# Patient Record
Sex: Female | Born: 1960 | Race: White | Hispanic: No | Marital: Married | Smoking: Never smoker
Health system: Southern US, Community
[De-identification: ages and names within clinical notes are randomized; demographics above are authoritative.]

## PROBLEM LIST (undated history)

## (undated) DIAGNOSIS — Q2381 Bicuspid aortic valve: Secondary | ICD-10-CM

## (undated) DIAGNOSIS — G43909 Migraine, unspecified, not intractable, without status migrainosus: Secondary | ICD-10-CM

## (undated) DIAGNOSIS — R002 Palpitations: Secondary | ICD-10-CM

## (undated) DIAGNOSIS — E785 Hyperlipidemia, unspecified: Secondary | ICD-10-CM

## (undated) DIAGNOSIS — K219 Gastro-esophageal reflux disease without esophagitis: Secondary | ICD-10-CM

## (undated) DIAGNOSIS — F419 Anxiety disorder, unspecified: Secondary | ICD-10-CM

## (undated) DIAGNOSIS — I341 Nonrheumatic mitral (valve) prolapse: Secondary | ICD-10-CM

## (undated) DIAGNOSIS — L918 Other hypertrophic disorders of the skin: Secondary | ICD-10-CM

## (undated) DIAGNOSIS — I350 Nonrheumatic aortic (valve) stenosis: Secondary | ICD-10-CM

## (undated) DIAGNOSIS — E119 Type 2 diabetes mellitus without complications: Secondary | ICD-10-CM

## (undated) DIAGNOSIS — Z9889 Other specified postprocedural states: Secondary | ICD-10-CM

## (undated) DIAGNOSIS — Q231 Congenital insufficiency of aortic valve: Secondary | ICD-10-CM

## (undated) DIAGNOSIS — I10 Essential (primary) hypertension: Secondary | ICD-10-CM

## (undated) DIAGNOSIS — R011 Cardiac murmur, unspecified: Secondary | ICD-10-CM

## (undated) DIAGNOSIS — M25579 Pain in unspecified ankle and joints of unspecified foot: Secondary | ICD-10-CM

## (undated) DIAGNOSIS — G2581 Restless legs syndrome: Secondary | ICD-10-CM

## (undated) DIAGNOSIS — M1711 Unilateral primary osteoarthritis, right knee: Principal | ICD-10-CM

## (undated) DIAGNOSIS — E669 Obesity, unspecified: Secondary | ICD-10-CM

## (undated) HISTORY — DX: Palpitations: R00.2

## (undated) HISTORY — DX: Unilateral primary osteoarthritis, right knee: M17.11

## (undated) HISTORY — DX: Bicuspid aortic valve: Q23.81

## (undated) HISTORY — DX: Cardiac murmur, unspecified: R01.1

## (undated) HISTORY — DX: Other specified postprocedural states: Z98.890

## (undated) HISTORY — DX: Nonrheumatic aortic (valve) stenosis: I35.0

## (undated) HISTORY — DX: Restless legs syndrome: G25.81

## (undated) HISTORY — DX: Migraine, unspecified, not intractable, without status migrainosus: G43.909

## (undated) HISTORY — DX: Obesity, unspecified: E66.9

## (undated) HISTORY — DX: Essential (primary) hypertension: I10

## (undated) HISTORY — PX: HERNIA REPAIR: SHX51

## (undated) HISTORY — DX: Pain in unspecified ankle and joints of unspecified foot: M25.579

## (undated) HISTORY — DX: Type 2 diabetes mellitus without complications: E11.9

## (undated) HISTORY — DX: Hyperlipidemia, unspecified: E78.5

## (undated) HISTORY — DX: Other hypertrophic disorders of the skin: L91.8

## (undated) HISTORY — DX: Congenital insufficiency of aortic valve: Q23.1

## (undated) HISTORY — PX: KNEE SURGERY: SHX244

## (undated) HISTORY — DX: Anxiety disorder, unspecified: F41.9

---

## 1999-10-31 HISTORY — PX: CHOLECYSTECTOMY: SHX55

## 2001-10-30 HISTORY — PX: ACHILLES TENDON REPAIR: SUR1153

## 2001-10-30 HISTORY — PX: UMBILICAL HERNIA REPAIR: SHX196

## 2002-04-14 ENCOUNTER — Ambulatory Visit (HOSPITAL_COMMUNITY): Admission: RE | Admit: 2002-04-14 | Discharge: 2002-04-14 | Payer: Self-pay | Admitting: Orthopaedic Surgery

## 2005-02-13 ENCOUNTER — Ambulatory Visit: Payer: Self-pay | Admitting: Cardiology

## 2005-07-11 ENCOUNTER — Ambulatory Visit: Payer: Self-pay | Admitting: Cardiology

## 2006-10-30 HISTORY — PX: CARDIAC CATHETERIZATION: SHX172

## 2006-12-05 ENCOUNTER — Ambulatory Visit: Payer: Self-pay | Admitting: Orthopedic Surgery

## 2006-12-17 ENCOUNTER — Ambulatory Visit: Payer: Self-pay | Admitting: Cardiology

## 2006-12-21 ENCOUNTER — Ambulatory Visit: Payer: Self-pay | Admitting: Cardiology

## 2007-01-10 ENCOUNTER — Ambulatory Visit: Payer: Self-pay | Admitting: Cardiology

## 2007-01-22 ENCOUNTER — Ambulatory Visit: Payer: Self-pay | Admitting: Cardiology

## 2007-01-24 ENCOUNTER — Ambulatory Visit: Payer: Self-pay | Admitting: Cardiovascular Disease

## 2007-01-24 ENCOUNTER — Inpatient Hospital Stay (HOSPITAL_BASED_OUTPATIENT_CLINIC_OR_DEPARTMENT_OTHER): Admission: RE | Admit: 2007-01-24 | Discharge: 2007-01-24 | Payer: Self-pay | Admitting: Cardiovascular Disease

## 2007-02-08 ENCOUNTER — Ambulatory Visit: Payer: Self-pay | Admitting: Physician Assistant

## 2007-12-02 LAB — CONVERTED CEMR LAB

## 2008-02-06 ENCOUNTER — Encounter (INDEPENDENT_AMBULATORY_CARE_PROVIDER_SITE_OTHER): Payer: Self-pay | Admitting: Family Medicine

## 2008-03-27 ENCOUNTER — Encounter (INDEPENDENT_AMBULATORY_CARE_PROVIDER_SITE_OTHER): Payer: Self-pay | Admitting: Family Medicine

## 2008-06-01 ENCOUNTER — Ambulatory Visit: Payer: Self-pay | Admitting: Family Medicine

## 2008-06-01 DIAGNOSIS — E669 Obesity, unspecified: Secondary | ICD-10-CM

## 2008-06-01 DIAGNOSIS — Q231 Congenital insufficiency of aortic valve: Secondary | ICD-10-CM

## 2008-06-01 DIAGNOSIS — F411 Generalized anxiety disorder: Secondary | ICD-10-CM | POA: Insufficient documentation

## 2008-06-01 DIAGNOSIS — G43909 Migraine, unspecified, not intractable, without status migrainosus: Secondary | ICD-10-CM | POA: Insufficient documentation

## 2008-06-01 DIAGNOSIS — I059 Rheumatic mitral valve disease, unspecified: Secondary | ICD-10-CM | POA: Insufficient documentation

## 2008-06-01 DIAGNOSIS — E119 Type 2 diabetes mellitus without complications: Secondary | ICD-10-CM

## 2008-06-01 DIAGNOSIS — Q2381 Bicuspid aortic valve: Secondary | ICD-10-CM | POA: Insufficient documentation

## 2008-06-01 DIAGNOSIS — I10 Essential (primary) hypertension: Secondary | ICD-10-CM

## 2008-06-01 LAB — CONVERTED CEMR LAB
Bilirubin Urine: NEGATIVE
Blood in Urine, dipstick: NEGATIVE
Glucose, Bld: 98 mg/dL
Glucose, Urine, Semiquant: NEGATIVE
Hgb A1c MFr Bld: 5.6 %
Ketones, urine, test strip: NEGATIVE
Nitrite: NEGATIVE
Protein, U semiquant: NEGATIVE
Specific Gravity, Urine: 1.015
Urobilinogen, UA: 0.2
pH: 6

## 2008-06-19 ENCOUNTER — Encounter (INDEPENDENT_AMBULATORY_CARE_PROVIDER_SITE_OTHER): Payer: Self-pay | Admitting: Family Medicine

## 2008-06-29 ENCOUNTER — Ambulatory Visit: Payer: Self-pay | Admitting: Family Medicine

## 2008-06-29 DIAGNOSIS — E785 Hyperlipidemia, unspecified: Secondary | ICD-10-CM | POA: Insufficient documentation

## 2008-06-29 DIAGNOSIS — L909 Atrophic disorder of skin, unspecified: Secondary | ICD-10-CM | POA: Insufficient documentation

## 2008-06-29 DIAGNOSIS — L919 Hypertrophic disorder of the skin, unspecified: Secondary | ICD-10-CM

## 2008-06-29 LAB — CONVERTED CEMR LAB
Cholesterol, target level: 200 mg/dL
Cholesterol: 165 mg/dL
HDL goal, serum: 40 mg/dL
HDL: 42 mg/dL
LDL Cholesterol: 82 mg/dL
LDL Goal: 100 mg/dL
Triglycerides: 206 mg/dL

## 2008-08-20 ENCOUNTER — Ambulatory Visit: Payer: Self-pay | Admitting: Family Medicine

## 2008-08-20 DIAGNOSIS — G2581 Restless legs syndrome: Secondary | ICD-10-CM

## 2008-08-20 LAB — CONVERTED CEMR LAB
Glucose, Bld: 158 mg/dL
Hgb A1c MFr Bld: 6.4 %

## 2008-09-28 ENCOUNTER — Encounter (INDEPENDENT_AMBULATORY_CARE_PROVIDER_SITE_OTHER): Payer: Self-pay | Admitting: Family Medicine

## 2008-11-06 ENCOUNTER — Ambulatory Visit: Payer: Self-pay | Admitting: Family Medicine

## 2008-11-06 LAB — CONVERTED CEMR LAB: Blood Glucose, Fasting: 152 mg/dL

## 2008-12-08 ENCOUNTER — Ambulatory Visit: Payer: Self-pay | Admitting: Cardiology

## 2008-12-08 ENCOUNTER — Ambulatory Visit: Payer: Self-pay | Admitting: Family Medicine

## 2008-12-08 DIAGNOSIS — G894 Chronic pain syndrome: Secondary | ICD-10-CM | POA: Insufficient documentation

## 2008-12-08 DIAGNOSIS — J069 Acute upper respiratory infection, unspecified: Secondary | ICD-10-CM | POA: Insufficient documentation

## 2008-12-08 DIAGNOSIS — R079 Chest pain, unspecified: Secondary | ICD-10-CM

## 2008-12-09 ENCOUNTER — Encounter (INDEPENDENT_AMBULATORY_CARE_PROVIDER_SITE_OTHER): Payer: Self-pay | Admitting: Family Medicine

## 2008-12-09 LAB — CONVERTED CEMR LAB
Amphetamine Screen, Ur: NEGATIVE
Barbiturate Quant, Ur: NEGATIVE
Benzodiazepines.: POSITIVE — AB
Cocaine Metabolites: NEGATIVE
Creatinine,U: 85.3 mg/dL
Marijuana Metabolite: NEGATIVE
Methadone: NEGATIVE
Opiate Screen, Urine: NEGATIVE
Phencyclidine (PCP): NEGATIVE
Propoxyphene: NEGATIVE

## 2008-12-11 ENCOUNTER — Ambulatory Visit (HOSPITAL_COMMUNITY): Admission: RE | Admit: 2008-12-11 | Discharge: 2008-12-11 | Payer: Self-pay | Admitting: Family Medicine

## 2008-12-11 ENCOUNTER — Encounter (INDEPENDENT_AMBULATORY_CARE_PROVIDER_SITE_OTHER): Payer: Self-pay | Admitting: Family Medicine

## 2008-12-11 ENCOUNTER — Ambulatory Visit: Payer: Self-pay | Admitting: Cardiology

## 2008-12-11 ENCOUNTER — Telehealth (INDEPENDENT_AMBULATORY_CARE_PROVIDER_SITE_OTHER): Payer: Self-pay | Admitting: Family Medicine

## 2008-12-21 ENCOUNTER — Encounter (INDEPENDENT_AMBULATORY_CARE_PROVIDER_SITE_OTHER): Payer: Self-pay | Admitting: Family Medicine

## 2008-12-22 ENCOUNTER — Telehealth (INDEPENDENT_AMBULATORY_CARE_PROVIDER_SITE_OTHER): Payer: Self-pay | Admitting: Family Medicine

## 2010-02-01 ENCOUNTER — Ambulatory Visit (HOSPITAL_COMMUNITY): Admission: RE | Admit: 2010-02-01 | Discharge: 2010-02-01 | Payer: Self-pay | Admitting: Family Medicine

## 2010-02-15 ENCOUNTER — Ambulatory Visit: Payer: Self-pay | Admitting: Cardiology

## 2010-05-20 ENCOUNTER — Ambulatory Visit (HOSPITAL_COMMUNITY): Admission: RE | Admit: 2010-05-20 | Discharge: 2010-05-20 | Payer: Self-pay | Admitting: General Surgery

## 2010-11-19 ENCOUNTER — Encounter: Payer: Self-pay | Admitting: Neurology

## 2011-01-14 LAB — SURGICAL PCR SCREEN
MRSA, PCR: NEGATIVE
Staphylococcus aureus: NEGATIVE

## 2011-01-14 LAB — BASIC METABOLIC PANEL
BUN: 11 mg/dL (ref 6–23)
CO2: 24 mEq/L (ref 19–32)
Calcium: 9.4 mg/dL (ref 8.4–10.5)
Creatinine, Ser: 0.57 mg/dL (ref 0.4–1.2)
GFR calc non Af Amer: >60 mL/min (ref >60)
Glucose, Bld: 148 mg/dL — ABNORMAL HIGH (ref 70–99)

## 2011-01-14 LAB — CBC
Hemoglobin: 13.3 g/dL (ref 12.0–15.0)
MCH: 29.6 pg (ref 26.0–34.0)
MCHC: 33.7 g/dL (ref 30.0–36.0)
RDW: 12.8 % (ref 11.5–15.5)

## 2011-03-17 NOTE — Cardiovascular Report (Signed)
Stacy Frost                  ACCOUNT NO.:  [id number]   MEDICAL RECORD NO.:  [id number]          PATIENT TYPE:  OIB   LOCATION:  NA                           FACILITY:  [facility]   PHYSICIAN:  Veverly Fells. Excell Seltzer, MD  DATE OF BIRTH:  1961/06/19   DATE OF PROCEDURE:  DATE OF DISCHARGE:                            CARDIAC CATHETERIZATION   Stacy Frost CARDIOLOGY CARDIAC CATH NOTE.   PROCEDURE:  1. Left heart catheterization.  2. Left coronary angiography.  3. Left ventricular angiography.  4. Aortic root angiography.   INDICATIONS FOR PROCEDURE:  Stacy Frost is a 50 year old woman with  multiple cardiac risk factors.  She has bicuspid aortic valve.  She has  had persistent chest pain and was referred for cardiac catheterization  in the setting of her multiple underlying risk factors for  atherosclerotic coronary artery disease.   RISKS AND INDICATIONS:  The risks and indications of the procedure were  explained to the patient.  Informed consent was obtained.  The right  groin was prepped and draped and anesthetized with 1% Lidocaine.  Using  modified Seldinger technique, a 4 French sheath was placed in the right  femoral artery via front wall puncture.  Multiple views of the right and  left coronary arteries were taken with standard 4 [place] and Judkins'  catheters.  Following selective coronary angiography, an angled pigtail  catheter was inserted into the left ventricle and pressures were  recorded.  A 30 degree RAO left ventriculogram was performed.  A  pullback across the aortic valve was done.  The left anterior oblique  aortogram was performed to assess the bicuspid aortic valve and proximal  aortic root.  The patient tolerated the procedure well and no immediate  complications.  At the conclusion of the case, the sheath was pulled and  manual pressure used for hemostasis.   FINDINGS:  The aortic pressure 111/61 with mean of 83.  Left ventricular  pressure 117/08 with an end  diastolic pressure of 22.   The left main stem is widely patent.  There is no angiographic disease.  It bifurcates into the LAD and left circumflex.   The LAD is a large caliber vessel that courses down to the LV apex.  It  gives off a 1st diagonal branch that is of medium caliber.  There is  also enlarged 1st perforator in that region of the LAD.  There is no  significant angiographic disease throughout the LAD or diagonal  branches.   The left circumflex is large caliber.  It provides a very large 1st  obtuse marginal branch that branches into twin vessels in its mid  portion.  The true AV groove circumflex continues to be a large caliber  vessel and gives off a small 2nd OM branch and a large left  posterolateral branch.  There is an atrial branch as well from the mid  portion of the left circumflex.  There is no significant angiographic  disease in the left circumflex system or any of the obtuse marginals.   The right coronary artery has a high anterior take-off.  There is a  conus branch from the proximal portion of the vessel.  The right  coronary artery is dominant and provides a posterior descending branch  as well as a posterolateral branch.  There is no significant  angiographic disease in the right coronary artery.   Left ventricular function as assessed by 30 degrees RAO left  ventriculogram shows normal systolic function with an estimated LVEF of  60%.   Aortic root angiogram demonstrates what is likely a bicuspid aortic  valve with a domed appearance.  There is no aortic insufficiency.  The  proximal aortic root is mildly dilated.   ASSESSMENT:  1. Normal coronary arteries.  2. Normal left ventricular systolic function.  3. Mild dilatation of the aortic root.  4. Mild dilatation of proximal ascending aorta.  5. Likely bicuspid aortic valve.   RECOMMENDATIONS:  The patient's blood pressure is under very good  control on her current regimen.  She has normal  coronary arteries. Her  aortic root dilation appears very mild but may require longitudinal  followup.  Ongoing care per Dr. Andee Lineman.      Veverly Fells. Excell Seltzer, MD  Electronically Signed     MDC/MEDQ  D:  01/24/2007  T:  01/24/2007  Job:  [id number]   cc:   Learta Codding, MD,FACC

## 2011-03-17 NOTE — Assessment & Plan Note (Signed)
[facility]                          [facility]   NAME:Stacy Frost, Stacy Frost                         MRN:          [id number]  DATE:12/17/2006                            DOB:          09/14/1961    PRIMARY CARDIOLOGIST:  Stacy Codding, MD,FACC   REASON FOR VISIT:  Ms. Stacy Frost is a 50 year old female, with a history of  bicuspid aortic valve, mitral valve prolapse, and history of  inappropriate sinus tachycardia with AVNRT, last seen by our team with  Dr. Andee Frost in April of 2006 for atypical chest pain, and previously  followed closely by Dr. Harland Frost. She now presents to our clinic  to re-establish with Dr. Andee Frost.   When last seen by our group, here at [facility] in consultation  in 2006, she had a 2D echocardiogram revealing bicuspid aortic valve  with no significant stenosis and only trace aortic insufficiency. It  also showed very mild MVP with only slight redundancy of the mitral  valve and mild mitral regurgitation.   A stress test was also done, which was interpreted as normal with  calculated ejection fraction of 59%.   Most recently, the patient states that she has developed a rapid heart  beat with rates as high as 120 with a sudden drop into the 60s. This has  made her feel a little lightheaded and dizzy, but with no frank syncope.  There has also been no shortness of breath and no associated dyspnea.   The patient has also noted elevated blood pressure readings at home as  compared to previous adequate control. Given the development of these  new symptoms in the past ten days or so, she has requested to resume  cardiology care with our team here today.   Electrocardiogram today reveals NSR at 86 bpm with left axis deviation  and borderline LVH by voltage criteria with nonspecific ST changes and  poor R-wave progression.   CURRENT MEDICATIONS:  1. Toprol XL 200 daily.  2. Metformin 1000 daily.  3. Amaryl 4  daily.  4. Hydrochlorothiazide 25 daily.   PHYSICAL EXAMINATION:  Blood pressure is 177/103, pulse 93, regular.  Weight is 236.4.  GENERAL: A 50 year old female, moderately obese, in no apparent  distress.  HEENT: Normocephalic, atraumatic.  NECK: Palpable bilateral carotid pulses without bruits.  LUNGS:  Clear to auscultation in all fields.  HEART: Regular rate and rhythm (S1, S2). Audible mid-systolic clicks  with no associated significant murmurs; no audible diastolic blow.  ABDOMEN: Soft, nontender with intact bowel sounds.  EXTREMITIES: Palpable pulses without significant edema.  NEURO: No focal deficit.   IMPRESSION:  1. Valvular heart disease.      a.     Known bicuspid aortic valve by previous 2D echos.      b.     History of mitral valve prolapse.  2. Tachy palpitations.      a.     History of SVT with inappropriate sinus tachycardia.      b.     Previously treated with beta-blockers and calcium-channel  blockers.  3. History of atypical chest pain.      a.     Normal stress Cardiolite in 2006.  4. Uncontrolled hypertension.  5. Obesity.  6. Type 2 diabetes mellitus.  7. History of hypertriglyceridemia.   PLAN:  1. Schedule 2D echocardiogram for reassessment of left ventricular      function and severity of valvular heart disease.  2. Schedule CardioNet monitor to rule out recurrent dysrhythmia.  3. Complete metabolic panel and TSH level.  4. Start lisinopril 20 mg daily for better blood pressure control.  5. Schedule followup BMET in one week for monitoring of electrolytes      and renal function.  6. Schedule return clinic followup with myself and Dr. Andee Frost in one      month for review of test results and further recommendation.      Stacy Serpe, PA-C  Electronically Signed      Stacy Codding, MD,FACC  Electronically Signed   GS/MedQ  DD: 12/17/2006  DT: 12/17/2006  Job #: [id number]

## 2011-03-17 NOTE — Assessment & Plan Note (Signed)
[facility]                          [facility]   NAME:Stacy Frost, Stacy Frost                         MRN:          [id number]  DATE:01/22/2007                            DOB:          April 20, 1961    Stacy Frost is here today for follow-up regarding her test results she  recently had done.  Stacy Frost was seen by Learta Codding, MD,FACC and  Gene Serpe, PA-C back February for complaints of rapid heart beat with a  rate in the 120s with a sudden drop in the 60s, with symptoms of  lightheadedness and dizziness.  At that visit, EKG showed normal sinus  rhythm at a rate of 86 beats per minute with left axis deviation and  borderline LVH, but no acute arrhythmias noted or acute ST or T wave  changes.  The patient was hypertensive with a blood pressure of 177/103.  She was scheduled for a two-dimensional echocardiogram, CardioNet  monitor, blood work, and started on Lisinopril 20 mg daily for her blood  pressure.  She returns today for reevaluation.  Mildly hypertensive  today at 140/88.  She states that she has taken the Lisinopril in  addition to her other medications without any problems.  She did undergo  echocardiogram earlier this month that showed normal LV size and  contraction with EF of 55-60%, mild left ventricular hypertrophy,  probably bicuspid aortic valve with mild aortic sclerosis, but no  definite aortic stenosis.   Note that Stacy Frost had an echocardiogram back in 2006 that noted the  bicuspid aortic valve.   Stacy Frost also wore a CardioNet monitor for 3 weeks that showed normal  sinus rhythm with occasional PACs.  Also noted intermittent sinus  tachycardia at a rate highest of 116.  On March 13, the highest rate was  noted at 148.  However, [name] had frequent episodes of calculated  artifact.  In reviewing the dates and times with her, she states many of  those times she was vacuuming or cleaning house around that time of day  and she did not recall experiencing any lightheadedness, dizziness, or  chest discomfort.  In further discussion with [name], apparently she  has had some episodes of chest discomfort noted with exertion, the most  recent being yesterday.  She states that she had just cleaned her house.  She had been vacuuming, cleaning, mopping, etc.  When she stopped, she  noted some substernal chest pressure.  She became dizzy and short of  breath.  Her daughter commented that she was pale.  The discomfort only  lasted 5-10 minutes and went away when she sat down to rest.  Throughout  the day she had intermittent episodes of the same symptoms.  Yesterday  evening, she had to go to [employer] to pick up a few things.  Once again  while she was in the store walking around, she had the discomfort again  and once again it was resolved with rest.   Note that Stacy Frost had an exercise Cardiolite done exactly 2 years ago  that showed  a normal ejection fraction of 59% and no obvious ischemia.  Stacy Frost has a history of hypertension.  She states that she has been  taking 200 mg of Toprol for many years.  Other risk factors also include  diabetes, obesity, questionable obstructive sleep apnea, family history  of premature coronary artery disease, and a history of hyperlipidemia.  She is also a diabetic.   REVIEW OF SYSTEMS:  As stated above.   PAST MEDICAL HISTORY:  1. Labile hypertension.  2. GERD.  3. Anxiety/panic attacks with claustrophobia.  4. Non-insulin dependent diabetes mellitus.  5. Migraines.  6. Status post cholecystectomy.  7. Obesity.  8. Hyperlipidemia, although no lipid panel noted in the patient's      chart.  9. Mitral valve prolapse that is confirmed by echocardiogram.  10.Dizziness and palpitations with intermittent periods of sinus      tachycardia/PAC's confirmed by CardioNet monitor.  11.Chest discomfort with questionable etiology.   ALLERGIES:  STADOL, IMITREX, COMPAZINE,  DHE, NORVASC.   CURRENT MEDICATIONS:  1. Toprol XL 200 mg daily.  2. Metformin 1000 mg daily.  3. Amaryl 4 mg daily.  4. Hydrochlorothiazide 25 mg daily.  5. Lisinopril 20 mg daily.  6. Xanax 0.5 mg t.i.d.   PHYSICAL EXAMINATION:  VITAL SIGNS:  Weight 126 pounds, blood pressure  initially 149/89, manual 140/88 with heart rate 98, respirations 20.  GENERAL:  Stacy Frost is in no acute distress.  Very pleasant,  cooperative, 50 year old Caucasian female.  LUNGS:  Clear to auscultation bilaterally.  NECK:  Supple without lymphadenopathy.  No bruits, no JVD.  CARDIOVASCULAR:  S1 and S2.  Regular rate and rhythm.  She has a 2/6  systolic ejection murmur.  ABDOMEN:  Soft and nontender with positive bowel sounds.  Obese.  EXTREMITIES:  Lower extremities without cyanosis, clubbing, or edema.  NEUROLOGY:  The patient is alert and oriented x3.  Cranial nerves II-XII  grossly intact.   IMPRESSION:  Chest pain of unknown etiology concerning for possible  angina.  The patient with multiple risk factors for premature coronary  artery disease including father status post bypass in his 72s.  The  patient with hyperlipidemia, labile hypertension, diabetes, obesity.   PLAN:  Discussed with patient.  She has undergone a stress test 2 years.  I offered up to her that we could repeat the stress test and I  reinforced that if it were positive she would need a cardiac  catheterization for confirmation or we go ahead and proceed with an  outpatient cardiac catheterization to get a definitive answer regarding  her symptoms.  The risks and benefits were discussed with the patient.  The patient is agreeable to cardiac catheterization if she can make sure  that she will be comfortable throughout the catheterization as she has a  history of anxiety, panic attacks, and severe claustrophobia.  I  reassured her that we would premedicate her and make her comfortable throughout the catheterization.  I am also  going to start her on aspirin  325 mg daily.  I have given her a prescription also for nitroglycerin  sublingual p.r.n. if needed.  Continue the Lisinopril at 20 mg daily.  I  am hesitant to add any new medications pending or increase the  Lisinopril to 40 mg as we are pending cardiac catheterization.  We will  reevaluate her blood pressure post catheterization for additional  therapy.  In the meantime continue her Metformin, Amaryl, Toprol,  hydrochlorothiazide, and have her hold the  Metformin prior to her  cardiac catheterization.  She knows that if her status changes before  her catheterization on Thursday, she needs to call 911 or present to the  closest emergency facility for evaluation.      Dorian Pod, ACNP  Electronically Signed      Learta Codding, MD,FACC  Electronically Signed   MB/MedQ  DD: 01/22/2007  DT: 01/22/2007  Job #: [id number]

## 2011-03-17 NOTE — Assessment & Plan Note (Signed)
[facility]                          [facility]   NAME:Stacy Frost, Stacy Frost                         MRN:          [id number]  DATE:02/08/2007                            DOB:          [birth year]    CARDIOLOGIST:  Learta Codding, M.D., [facility]   PRIMARY CARE PHYSICIAN:  Linward Foster, M.D.   HISTORY OF PRESENT ILLNESS:  Stacy Frost is a very pleasant 50 year old  female patient who was recently evaluated by Dr. Andee Lineman for  palpitations.  She was set up for an echocardiogram which revealed an EF  of 55-60%, mild LVH, bicuspid aortic valve, and mild mitral valve  prolapse.  Her CardioNet monitor also came back revealing sinus rhythm,  occasional sinus tachycardia, and several episodes of PACs.  When she  saw Dorian Pod back in the office last week for followup, she noted  some chest discomfort that was concerning for angina, and she was set up  for cardiac catheterization.  This was performed by Dr. Tonny Bollman  on January 24, 2007.  This revealed normal coronary arteries.  She had  normal LV systolic function, mild dilatation of the aortic root, mild  dilatation of the proximal ascending aorta, and likely bicuspid aortic  valve.  She returns to the office today for followup.  She is doing  well.  She continues to have occasional chest pains off and on.  She has  had nothing like what she had prior to her heart catheterization.  She  denies any dyspnea.  She denies any orthopnea or paroxysmal nocturnal  dyspnea.  She denies any syncope.  She continues to have palpitations.  They seem to be more frequent.  She did have an episode a couple of days  ago where she felt her heart was racing at about 180, but when she  checked her heart rate it was 102 to 118.   CURRENT MEDICATIONS:  1. Toprol-XL 200 mg daily.  2. Metformin 1 g daily.  3. Amaryl 4 mg daily.  4. HCTZ 25 mg daily.  5. Lisinopril 20 mg daily.  6. Aspirin 325 mg daily.  7.  Vicodin p.r.n.  8. Xanax p.r.n.  9. Nitroglycerin p.r.n.   ALLERGIES:  STADOL, IMITREX, COMPAZINE, DHE, AND NORVASC.   PHYSICAL EXAMINATION:  GENERAL:  She is a well-nourished, well-developed  female in no acute distress.  VITAL SIGNS:  Blood pressure 146/88, pulse 83, weight 235.8 pounds.  HEENT:  Unremarkable.  NECK:  Without JVD.  CARDIAC:  S1 and S2.  Regular rate and rhythm without murmurs.  LUNGS:  Clear to auscultation bilaterally without wheezing, rhonchi, or  rales.  ABDOMEN:  Soft, nontender.  Normoactive bowel sounds.  No organomegaly.  EXTREMITIES:  No edema.  Calves are soft and nontender.  Right femoral  arteriotomy site without hematoma or bruit.   Electrocardiogram reveals sinus rhythm with a heart rate of 83, no acute  changes.   IMPRESSION:  1. Palpitations.      a.     Probable symptomatic premature atrial contractions.  2. Occasional sinus tachycardia.  a.     Recent thyroid stimulating hormone normal at 0.61.      b.     Recent CardioNet monitor without significant arrhythmias.  3. Mild mitral valve prolapse by recent echocardiogram.  4. Probable bicuspid aortic valve with mild aortic root dilatation.  5. Normal coronary arteries by recent catheterization.  6. Hypertension.  7. Diabetes mellitus.  8. History of anxiety and panic disorder.  9. History of migraine headaches.  10.Obesity.   PLAN:  The patient returns to the office today for followup on her  recent cardiac catheterization.  I have gone over those results with her  today and reassured her about the findings of normal coronary arteries.  She notes that she continues to have palpitations from time to time.  She did have evidence of PACs on her monitor as well as episodes of  sinus tachycardia.  No significant arrhythmias were noted.  She does  have a lot of stressors at home, and I think anxiety is playing a large  role.  She probably is experiencing some atypical chest pains from time   to time from her mitral valve prolapse as well.  At this point, I have  recommended that we try to uptitrate her beta-blocker.  I think we will  try changing her to b.i.d. dosing to see if that helps as well.  Therefore, I have given her a prescription for Toprol-XL 50 mg.  She  will combine her Toprol to achieve a dose of 125 mg twice a day.  She  will come back for a blood pressure and a heart rate check in a week.  As long as that is stable, she will go up to 150 mg twice a day.  I have  also asked her to follow up with her primary care physician to see if  she would benefit from one of the newer SSRIs to help her out with her  anxiety.  We will bring her back to his office in about  two months to follow up on her symptoms.  She knows to return sooner as  needed.  She will need 2-D echocardiograms every one to two years to  survey her bicuspid aortic valve.      Tereso Newcomer, PA-C  Electronically Signed      Jonelle Sidle, MD  Electronically Signed   SW/MedQ  DD: 02/08/2007  DT: 02/08/2007  Job #: [id number]   cc:   Linward Foster

## 2011-04-27 ENCOUNTER — Encounter: Payer: Self-pay | Admitting: Family Medicine

## 2011-10-19 ENCOUNTER — Ambulatory Visit (HOSPITAL_COMMUNITY)
Admission: RE | Admit: 2011-10-19 | Discharge: 2011-10-19 | Disposition: A | Discharge status: Home / Self Care | Payer: PRIVATE HEALTH INSURANCE | Source: Ambulatory Visit | Attending: Family Medicine | Admitting: Family Medicine

## 2011-10-19 ENCOUNTER — Other Ambulatory Visit (HOSPITAL_COMMUNITY): Payer: Self-pay | Admitting: Family Medicine

## 2011-10-19 DIAGNOSIS — R29898 Other symptoms and signs involving the musculoskeletal system: Secondary | ICD-10-CM

## 2011-10-19 DIAGNOSIS — R51 Headache: Secondary | ICD-10-CM

## 2011-10-19 DIAGNOSIS — R42 Dizziness and giddiness: Secondary | ICD-10-CM

## 2011-10-19 DIAGNOSIS — R4701 Aphasia: Secondary | ICD-10-CM | POA: Insufficient documentation

## 2011-10-19 DIAGNOSIS — R209 Unspecified disturbances of skin sensation: Secondary | ICD-10-CM | POA: Insufficient documentation

## 2011-10-19 MED ORDER — GADOBENATE DIMEGLUMINE 529 MG/ML IV SOLN
20.0000 mL | Freq: Once | INTRAVENOUS | Status: AC | PRN
  Administered 2011-10-19: 20 mL via INTRAVENOUS

## 2011-10-26 ENCOUNTER — Ambulatory Visit (HOSPITAL_COMMUNITY)
Admission: RE | Admit: 2011-10-26 | Discharge: 2011-10-26 | Disposition: A | Discharge status: Home / Self Care | Payer: PRIVATE HEALTH INSURANCE | Source: Ambulatory Visit | Attending: Family Medicine | Admitting: Family Medicine

## 2011-10-26 DIAGNOSIS — R4701 Aphasia: Secondary | ICD-10-CM | POA: Insufficient documentation

## 2011-10-26 DIAGNOSIS — R5381 Other malaise: Secondary | ICD-10-CM | POA: Insufficient documentation

## 2011-10-26 DIAGNOSIS — R51 Headache: Secondary | ICD-10-CM

## 2011-10-26 DIAGNOSIS — R29898 Other symptoms and signs involving the musculoskeletal system: Secondary | ICD-10-CM

## 2011-10-26 DIAGNOSIS — R42 Dizziness and giddiness: Secondary | ICD-10-CM | POA: Insufficient documentation

## 2011-10-26 DIAGNOSIS — E119 Type 2 diabetes mellitus without complications: Secondary | ICD-10-CM | POA: Insufficient documentation

## 2011-10-27 ENCOUNTER — Ambulatory Visit (HOSPITAL_COMMUNITY)
Admission: RE | Admit: 2011-10-27 | Discharge: 2011-10-27 | Disposition: A | Discharge status: Home / Self Care | Payer: PRIVATE HEALTH INSURANCE | Source: Ambulatory Visit | Attending: Family Medicine | Admitting: Family Medicine

## 2011-10-27 DIAGNOSIS — R55 Syncope and collapse: Secondary | ICD-10-CM | POA: Insufficient documentation

## 2011-10-27 DIAGNOSIS — I359 Nonrheumatic aortic valve disorder, unspecified: Secondary | ICD-10-CM

## 2011-10-27 NOTE — Progress Notes (Signed)
*  PRELIMINARY RESULTS* Echocardiogram 2D Echocardiogram has been performed.  Conrad Robeline 10/27/2011, 2:09 PM

## 2012-05-30 ENCOUNTER — Ambulatory Visit: Payer: PRIVATE HEALTH INSURANCE | Admitting: Orthopedic Surgery

## 2012-06-29 ENCOUNTER — Emergency Department (HOSPITAL_COMMUNITY)
Admission: EM | Admit: 2012-06-29 | Discharge: 2012-06-30 | Disposition: A | Discharge status: Home / Self Care | Payer: PRIVATE HEALTH INSURANCE | Attending: Emergency Medicine | Admitting: Emergency Medicine

## 2012-06-29 ENCOUNTER — Encounter (HOSPITAL_COMMUNITY): Payer: Self-pay | Admitting: Emergency Medicine

## 2012-06-29 ENCOUNTER — Emergency Department (HOSPITAL_COMMUNITY): Payer: PRIVATE HEALTH INSURANCE

## 2012-06-29 DIAGNOSIS — E119 Type 2 diabetes mellitus without complications: Secondary | ICD-10-CM | POA: Insufficient documentation

## 2012-06-29 DIAGNOSIS — I059 Rheumatic mitral valve disease, unspecified: Secondary | ICD-10-CM | POA: Insufficient documentation

## 2012-06-29 DIAGNOSIS — E669 Obesity, unspecified: Secondary | ICD-10-CM | POA: Insufficient documentation

## 2012-06-29 DIAGNOSIS — R509 Fever, unspecified: Secondary | ICD-10-CM

## 2012-06-29 DIAGNOSIS — I10 Essential (primary) hypertension: Secondary | ICD-10-CM | POA: Insufficient documentation

## 2012-06-29 DIAGNOSIS — G2581 Restless legs syndrome: Secondary | ICD-10-CM | POA: Insufficient documentation

## 2012-06-29 DIAGNOSIS — E785 Hyperlipidemia, unspecified: Secondary | ICD-10-CM | POA: Insufficient documentation

## 2012-06-29 DIAGNOSIS — F411 Generalized anxiety disorder: Secondary | ICD-10-CM | POA: Insufficient documentation

## 2012-06-29 LAB — URINE MICROSCOPIC-ADD ON

## 2012-06-29 LAB — URINALYSIS, ROUTINE W REFLEX MICROSCOPIC
Bilirubin Urine: NEGATIVE
Glucose, UA: NEGATIVE mg/dL
Protein, ur: NEGATIVE mg/dL
Urobilinogen, UA: 0.2 mg/dL (ref 0.0–1.0)

## 2012-06-29 MED ORDER — IBUPROFEN 800 MG PO TABS
800.0000 mg | ORAL_TABLET | Freq: Once | ORAL | Status: AC
  Administered 2012-06-29: 800 mg via ORAL
  Filled 2012-06-29: qty 1

## 2012-06-29 MED ORDER — ONDANSETRON 4 MG PO TBDP
4.0000 mg | ORAL_TABLET | Freq: Once | ORAL | Status: AC
  Administered 2012-06-29: 4 mg via ORAL
  Filled 2012-06-29: qty 1

## 2012-06-29 MED ORDER — HYDROCODONE-ACETAMINOPHEN 5-325 MG PO TABS
1.0000 | ORAL_TABLET | Freq: Once | ORAL | Status: AC
  Administered 2012-06-29: 1 via ORAL
  Filled 2012-06-29: qty 1

## 2012-06-29 NOTE — ED Provider Notes (Signed)
History     CSN: [id number]  Arrival date & time 06/29/12  2138   First MD Initiated Contact with Patient 06/29/12 2154      Chief Complaint  Patient presents with  . Back Pain  . Emesis  . Fever    (Consider location/radiation/quality/duration/timing/severity/associated sxs/prior treatment) HPI Comments: States she was out shopping today and began having shaking chills.  She took her diabetes meds this AM but had not eaten anything and thought her blood sugar was low.  She called her husband who came to get her.  Her CBG was 153.  She began having mid back pain and mild cough.  She went home and layed down for a couple hours.  Her husband checked an auricular temp and it read 103.9.  She took two tylenol and the temp has slowly normalized.  She denies any back injury and she has no UTI sxs.  She has had 3 episodes of vomiting and 1 bout of diarrhea today.  Pt of dr. Renard Matter.  Patient is a 51 y.o. female presenting with back pain, vomiting, and fever. The history is provided by the patient. No language interpreter was used.  Back Pain  This is a new problem. Episode onset: this afternoon. The problem has been gradually improving. The pain is associated with no known injury. The pain is present in the thoracic spine. The quality of the pain is described as aching. The pain does not radiate. The pain is moderate. Associated symptoms include a fever and perianal numbness. Pertinent negatives include no chest pain, no abdominal pain, no bowel incontinence, no bladder incontinence, no dysuria, no pelvic pain, no leg pain, no paresthesias, no paresis and no tingling.  Emesis  Associated symptoms include chills, cough and a fever. Pertinent negatives include no abdominal pain.  Fever Primary symptoms of the febrile illness include fever, cough and vomiting. Primary symptoms do not include shortness of breath, abdominal pain or dysuria.    Past Medical History  Diagnosis Date  . Need for  prophylactic vaccination and inoculation against influenza   . Restless leg syndrome   . Skin tag   . Hyperlipidemia   . Obesity   . Pain in joint, ankle and foot     Chronic  . Aortic insufficiency   . Mitral valve prolapse   . Migraine headache   . Hypertension   . Diabetes mellitus     Type II, controlled  . Anxiety     Past Surgical History  Procedure Date  . Cholecystectomy 2001    "Poor EF at 17%"  . Umbilical hernia repair 2003  . Achilles tendon repair 2003    Left  . Cardiac catheterization 2008    Normal coronary arteries; normal LV systolic function; mild dilation of aortic root; mild dilation of proximal ascending aorta; likely bicuspid aortic valve    Family History  Problem Relation Age of Onset  . Depression Mother   . Cancer Mother     Lung mets (unsure as to what kind)  . Hypertension Father   . Depression Father   . Depression Daughter   . Anxiety disorder Daughter     History  Substance Use Topics  . Smoking status: Never Smoker   . Smokeless tobacco: Not on file  . Alcohol Use: No    OB History    Grav Para Term Preterm Abortions TAB SAB Ect Mult Living  Review of Systems  Constitutional: Positive for fever and chills.  Respiratory: Positive for cough. Negative for shortness of breath.   Cardiovascular: Negative for chest pain.  Gastrointestinal: Positive for vomiting. Negative for abdominal pain and bowel incontinence.  Genitourinary: Negative for bladder incontinence, dysuria and pelvic pain.  Musculoskeletal: Positive for back pain.  Neurological: Negative for tingling and paresthesias.  All other systems reviewed and are negative.    Allergies  Amitriptyline; Amlodipine besylate; Butorphanol tartrate; Morphine and related; and Sumatriptan  Home Medications   Current Outpatient Rx  Name Route Sig Dispense Refill  . ALPRAZOLAM 0.5 MG PO TABS Oral Take 0.5 mg by mouth daily. Take up to 4 daily.     . ASPIRIN  81 MG PO TBEC Oral Take 81 mg by mouth daily.      . AZITHROMYCIN 250 MG PO TABS Oral Take 250 mg by mouth as directed.      Marland Kitchen HYDROCHLOROTHIAZIDE 25 MG PO TABS Oral Take 25 mg by mouth daily.      Marland Kitchen HYDROCODONE-ACETAMINOPHEN 7.5-750 MG PO TABS Oral Take 1 tablet by mouth 3 (three) times daily as needed.      Marland Kitchen LISINOPRIL 20 MG PO TABS Oral Take 20 mg by mouth daily.      Marland Kitchen METFORMIN HCL ER (MOD) 500 MG PO TB24 Oral Take 500 mg by mouth 2 (two) times daily.      Marland Kitchen METOPROLOL SUCCINATE ER 200 MG PO TB24 Oral Take 200 mg by mouth daily.      Marland Kitchen METRONIDAZOLE 1 % EX GEL Topical Apply 1 application topically daily as needed.      Marland Kitchen FISH OIL 1000 MG PO CAPS Oral Take 1,000 mg by mouth 2 (two) times daily.      Marland Kitchen PRAMIPEXOLE DIHYDROCHLORIDE 0.25 MG PO TABS Oral Take 0.25 mg by mouth at bedtime. For 3 nights then take 2 tablets (0.5 mg) nightly.       BP 127/77  Pulse 113  Temp 99.4 F (37.4 C) (Oral)  Resp 20  Ht 5\' 6"  (1.676 m)  Wt 218 lb (98.884 kg)  BMI 35.19 kg/m2  SpO2 96%  Physical Exam  Nursing note and vitals reviewed. Constitutional: She is oriented to person, place, and time. She appears well-developed and well-nourished. No distress.  HENT:  Head: Normocephalic and atraumatic.  Eyes: EOM are normal.  Neck: Normal range of motion.  Cardiovascular: Regular rhythm and normal heart sounds.   Pulmonary/Chest: Effort normal and breath sounds normal. No accessory muscle usage. Not tachypneic. No respiratory distress. She has no wheezes. She has no rales.   She exhibits no tenderness.  Abdominal: Soft. She exhibits no distension. There is no tenderness.  Musculoskeletal: Normal range of motion.  Neurological: She is alert and oriented to person, place, and time.  Skin: Skin is warm and dry.  Psychiatric: She has a normal mood and affect. Judgment normal.    ED Course  Procedures (including critical care time)  Labs Reviewed  URINALYSIS, ROUTINE W REFLEX MICROSCOPIC - Abnormal;  Notable for the following:    Specific Gravity, Urine <1.005 (*)     Hgb urine dipstick TRACE (*)     All other components within normal limits  URINE MICROSCOPIC-ADD ON   Dg Chest 2 View  06/29/2012  *RADIOLOGY REPORT*  Clinical Data: Back pain, cough and fever  CHEST - 2 VIEW  Comparison: 02/01/2010  Findings: The heart size and mediastinal contours are within normal limits.  Both lungs are  clear.  Mild spondylosis noted within the thoracic spine.  IMPRESSION:  1.  No acute findings.   Original Report Authenticated By: Rosealee Albee, M.D.      1. Fever and chills      Discussed u/a and CXR findings with pt and her spouse.  No identified  Source of infection and fever.    They agree with watchful waiting.  She will f/u with dr. Renard Matter.  She will return here if any problems in the meantime. MDM          Evalina Field, PA 06/30/12 [phone]

## 2012-06-29 NOTE — ED Notes (Addendum)
Patient complaining of back pain, chills, vomiting, and fever starting about 1700 today. States fever was 103.8 this evening. Tylenol taken at home at 1830.

## 2012-06-30 NOTE — ED Provider Notes (Signed)
Medical screening examination/treatment/procedure(s) were performed by non-physician practitioner and as supervising physician I was immediately available for consultation/collaboration.   Benny Lennert, MD 06/30/12 [phone]

## 2012-07-11 ENCOUNTER — Encounter: Payer: Self-pay | Admitting: Cardiology

## 2012-07-12 ENCOUNTER — Ambulatory Visit (INDEPENDENT_AMBULATORY_CARE_PROVIDER_SITE_OTHER): Payer: PRIVATE HEALTH INSURANCE | Admitting: Cardiology

## 2012-07-12 ENCOUNTER — Encounter: Payer: Self-pay | Admitting: Cardiology

## 2012-07-12 VITALS — BP 120/80 | HR 83 | Ht 66.0 in | Wt 220.0 lb

## 2012-07-12 DIAGNOSIS — Q231 Congenital insufficiency of aortic valve: Secondary | ICD-10-CM

## 2012-07-12 DIAGNOSIS — E785 Hyperlipidemia, unspecified: Secondary | ICD-10-CM

## 2012-07-12 DIAGNOSIS — Z01818 Encounter for other preprocedural examination: Secondary | ICD-10-CM | POA: Insufficient documentation

## 2012-07-12 DIAGNOSIS — I10 Essential (primary) hypertension: Secondary | ICD-10-CM

## 2012-07-12 NOTE — Assessment & Plan Note (Signed)
We will arrange a one-year followup for clinical exam.

## 2012-07-12 NOTE — Assessment & Plan Note (Signed)
Patient history reviewed. She describes no significant angina or shortness of breath, had documentation of normal coronary arteries in 2008, and has a history of a bicuspid aortic valve with only very mild aortic stenosis, explaining her heart murmur and asymptomatic at this point. I did see mention of possible mitral valve prolapse on prior records, however her most recent echocardiogram does not support this. I reviewed her recent ECG. She should be able to proceed with relatively low risk arthroscopic knee surgery from a cardiac perspective.

## 2012-07-12 NOTE — Assessment & Plan Note (Signed)
Followed by Dr. Renard Matter on medical therapy.

## 2012-07-12 NOTE — Assessment & Plan Note (Signed)
Followed by Dr. Renard Matter on statin therapy.

## 2012-07-12 NOTE — Progress Notes (Signed)
Clinical Summary Stacy Frost is a 51 y.o.female referred for cardiology consultation by Dr. Renard Frost. She is undergoing preoperative assessment prior to left knee arthroscopic surgery with Dr. Cleophas Frost.  Records were reviewed. She has been seen by our practice in the past, had documentation of normal coronary arteries in 2008, also has a known bicuspid aortic valve with very mild stenosis, assessed in December 2012 most recently.  From a symptom perspective she reports no major palpitations, syncope, no anginal chest pain, no limiting shortness of breath. Most significant limitation has been related to left knee pain.  ECG done recently was reviewed showing sinus rhythm with low voltage and decreased R wave progression.   Allergies  Allergen Reactions  . Amitriptyline   . Amlodipine Besylate     REACTION: SOB  . Butorphanol Tartrate     REACTION: ED visit and got for migraine - not sure of response  . Morphine And Related   . Sumatriptan     REACTION: HTN    Current Outpatient Prescriptions  Medication Sig Dispense Refill  . ALPRAZolam (XANAX) 0.5 MG tablet Take 0.5 mg by mouth daily. Take up to 4 daily.       Marland Kitchen aspirin 325 MG tablet Take 325 mg by mouth daily.      . furosemide (LASIX) 20 MG tablet Take 20 mg by mouth 2 (two) times daily. 40 mg in am and 20 mg pm      . HYDROcodone-acetaminophen (VICODIN ES) 7.5-750 MG per tablet Take 1 tablet by mouth 3 (three) times daily as needed.        Marland Kitchen lisinopril (PRINIVIL,ZESTRIL) 20 MG tablet Take 20 mg by mouth daily.        . metFORMIN (GLUCOPHAGE) 1000 MG tablet Take 1,000 mg by mouth 2 (two) times daily with a meal.      . metoprolol (TOPROL-XL) 200 MG 24 hr tablet Take 200 mg by mouth daily.        . potassium chloride SA (K-DUR,KLOR-CON) 20 MEQ tablet Take 20 mEq by mouth 2 (two) times daily.      . simvastatin (ZOCOR) 40 MG tablet Take 40 mg by mouth every evening.        Past Medical History  Diagnosis Date  . Restless leg  syndrome   . Skin tag   . Hyperlipidemia   . Obesity   . Pain in joint, ankle and foot     Chronic  . Bicuspid aortic valve     Very mild stenosis 12/12  . Migraine headache   . Essential hypertension, benign   . Type 2 diabetes mellitus   . Anxiety   . History of cardiac catheterization     Normal coronaries 2008  . Palpitations     Documented PACs by previous monitoring    Past Surgical History  Procedure Date  . Cholecystectomy 2001    "Poor EF at 17%"  . Umbilical hernia repair 2003  . Achilles tendon repair 2003    Left  . Cardiac catheterization 2008    Normal coronary arteries; normal LV systolic function; mild dilation of aortic root; mild dilation of proximal ascending aorta; likely bicuspid aortic valve    Family History  Problem Relation Age of Onset  . Depression Mother   . Cancer Mother     Lung mets (unsure as to what kind)  . Hypertension Father   . Depression Father   . Depression Daughter   . Anxiety disorder Daughter  Social History Stacy Frost reports that she has never smoked. She does not have any smokeless tobacco history on file. Stacy Frost reports that she does not drink alcohol.  Review of Systems Negative except as outlined above.  Physical Examination Filed Vitals:   07/12/12 0933  BP: 120/80  Pulse: 83   Filed Weights   07/12/12 0933  Weight: 220 lb (99.791 kg)   Patient in no acute distress. HEENT: Conjunctiva and lids normal, oropharynx clear. Neck: Supple, no elevated JVP or carotid bruits, no thyromegaly. Lungs: Clear to auscultation, nonlabored breathing at rest. Cardiac: Regular rate and rhythm, no S3, 2/6 systolic murmur, no pericardial rub. Abdomen: Soft, nontender,  bowel sounds present, no guarding or rebound. Extremities: No pitting edema, distal pulses 2+. Skin: Warm and dry. Musculoskeletal: No kyphosis. Neuropsychiatric: Alert and oriented x3, affect grossly appropriate.   Problem List and Plan    Preoperative evaluation to rule out surgical contraindication Patient history reviewed. She describes no significant angina or shortness of breath, had documentation of normal coronary arteries in 2008, and has a history of a bicuspid aortic valve with only very mild aortic stenosis, explaining her heart murmur and asymptomatic at this point. I did see mention of possible mitral valve prolapse on prior records, however her most recent echocardiogram does not support this. I reviewed her recent ECG. She should be able to proceed with relatively low risk arthroscopic knee surgery from a cardiac perspective.  Bicuspid aortic valve We will arrange a one-year followup for clinical exam.  Essential hypertension, benign Followed by Dr. Renard Frost on medical therapy.  HYPERLIPIDEMIA Followed by Dr. Renard Frost on statin therapy.    Stacy Frost, M.D., F.A.C.C.

## 2012-07-12 NOTE — Patient Instructions (Addendum)
Your physician recommends that you continue on your current medications as directed. Please refer to the Current Medication list given to you today.  Your physician wants you to follow-up in: 1 year with Dr. McDowell. You will receive a reminder letter in the mail two months in advance. If you don't receive a letter, please call our office to schedule the follow-up appointment. 

## 2012-07-24 ENCOUNTER — Telehealth: Payer: Self-pay | Admitting: Cardiology

## 2012-07-24 NOTE — Telephone Encounter (Signed)
Advised patient that she would be evaluated by surgeon and anesthesia and would have antibiotic per their descression.

## 2012-07-24 NOTE — Telephone Encounter (Signed)
Pt is having knee surgery 07/25/12 at 12:00. She needs to know if she needs to be pre-medicated due to heart condition. She states that she has been in the past.

## 2013-07-25 ENCOUNTER — Ambulatory Visit: Payer: PRIVATE HEALTH INSURANCE | Admitting: Cardiology

## 2013-08-01 ENCOUNTER — Encounter: Payer: PRIVATE HEALTH INSURANCE | Admitting: Cardiology

## 2013-08-01 ENCOUNTER — Encounter: Payer: Self-pay | Admitting: Cardiology

## 2013-08-01 NOTE — Progress Notes (Signed)
NNo-show. This encounter was created in error - please disregard. 

## 2013-08-08 ENCOUNTER — Ambulatory Visit (INDEPENDENT_AMBULATORY_CARE_PROVIDER_SITE_OTHER): Payer: PRIVATE HEALTH INSURANCE | Admitting: Surgery

## 2013-08-08 ENCOUNTER — Other Ambulatory Visit (INDEPENDENT_AMBULATORY_CARE_PROVIDER_SITE_OTHER): Payer: Self-pay

## 2013-08-08 ENCOUNTER — Encounter (INDEPENDENT_AMBULATORY_CARE_PROVIDER_SITE_OTHER): Payer: Self-pay | Admitting: Surgery

## 2013-08-08 VITALS — BP 130/80 | HR 74 | Temp 97.3°F | Resp 14 | Ht 66.0 in | Wt 212.0 lb

## 2013-08-08 DIAGNOSIS — K469 Unspecified abdominal hernia without obstruction or gangrene: Secondary | ICD-10-CM

## 2013-08-08 DIAGNOSIS — K432 Incisional hernia without obstruction or gangrene: Secondary | ICD-10-CM | POA: Insufficient documentation

## 2013-08-08 NOTE — Patient Instructions (Addendum)
Low-Fiber Diet Fiber is found in fruits, vegetables, and grains. A low-fiber diet restricts fibrous foods that are not digested in the small intestine. A diet containing about 10 grams of fiber is considered low fiber.  PURPOSE  To prevent blockage of a partially obstructed or narrowed gastrointestinal tract.  To reduce fecal weight and volume.  To slow the movement of feces. WHEN IS THIS DIET USED?  It may be used during the acute phase of Crohn disease, ulcerative colitis, regional enteritis, or diverticulitis.  It may be used if your intestinal or esophageal tubes are narrowing (stenosis).  It may be used as a transitional diet following surgery, injury (trauma), or illness. CHOOSING FOODS Check labels, especially on foods from the starch list. Often times, dietary fiber content is listed on the nutrition facts panel. Please ask your Registered Dietitian if you have questions about specific foods that are related to your condition, especially if the food is not listed on this handout. Breads and Starches  Allowed: White, French, and pita breads, plain rolls, buns, or sweet rolls, doughnuts, waffles, pancakes, bagels. Plain muffins, biscuits, matzoth. Soda, saltine, graham crackers. Pretzels, rusks, melba toast, zwieback. Cooked cereals: cornmeal, farina, or cream cereals. Dry cereals: refined corn, wheat, rice, and oat cereals (check label). Potatoes prepared any way without skins, refined macaroni, spaghetti, noodles, refined rice.  Avoid: Whole-wheat bread, rolls, and crackers. Multigrains, rye, bran seeds, nuts, or coconut. Cereals containing whole grains, multigrains, bran, coconut, nuts, raisins. Cooked or dry oatmeal. Coarse wheat cereals, granola. Cereals advertised as "high fiber." Potato skins. Whole-grain pasta, wild or brown rice. Popcorn. Vegetables  Allowed: Strained tomato and vegetable juices. Fresh lettuce, cucumber, spinach. Well-cooked or canned: asparagus, bean sprouts,  broccoli, cut green beans, cauliflower, pumpkin, beets, mushrooms, olives, yellow squash, tomato, tomato sauce, zucchini, turnips.Keep servings limited to  cup.  Avoid: Fresh, cooked, or canned: artichokes, baked beans, beet greens, Brussels sprouts, corn, kale, legumes, peas, sweet potatoes. Avoid large servings of any vegetables. Fruit  Allowed: All fruit juices except prune juice. Cooked or canned fruits without skin and seeds: apricots, applesauce, cantaloupe, cherries, grapefruit, grapes, kiwi, mandarin oranges, peaches, pears, fruit cocktail, pineapple, plums, watermelon. Fresh without skin: banana, grapes, cantaloupe, avocado, cherries, pineapple, kiwi, nectarines, peaches, blueberries. Keep servings limited to  cup or 1 piece.  Avoid: Fresh: apples with or without skin, apricots, mangoes, pears, raspberries, strawberries. Prune juice and juices with pulp, stewed or dried prunes. Dried fruits, raisins, dates. Avoid large servings of all fresh fruits. Meat and Protein Substitutes  Allowed: Ground or well-cooked tender beef, ham, veal, lamb, pork, poultry. Eggs, plain cheese. Fish, oysters, shrimp, lobster, other seafood. Liver, organ meats. Smooth nut butters.  Avoid: Tough, fibrous meats with gristle. Chunky nut butter.Cheese with seeds, nuts, or other foods not allowed. Nuts, seeds, legumes, dried peas, beans, lentils. Dairy  Allowed: All milk products except those not allowed.  Avoid: Yogurt or cheese that contains nuts, seeds, or added fruit. Soups and Combination Foods  Allowed: Bouillon, broth, or cream soups made from allowed foods. Any strained soup. Casseroles or mixed dishes made with allowed foods.  Avoid: Soups made from vegetables that are not allowed or that contain other foods not allowed. Desserts and Sweets  Allowed:Plain cakes and cookies, pie made with allowed fruit, pudding, custard, cream pie. Gelatin, fruit, ice, sherbet, frozen ice pops. Ice cream, ice milk  without nuts. Plain hard candy, honey, jelly, molasses, syrup, sugar, chocolate syrup, gumdrops, marshmallows.  Avoid: Desserts, cookies, or candies that   contain nuts, peanut butter, dried fruits. Jams, preserves with seeds, marmalade. Fats and Oils  Allowed:Margarine, butter, cream, mayonnaise, salad oils, plain salad dressings made from allowed foods.  Avoid: Seeds, nuts, olives. Beverages  Allowed: All, except those listed to avoid.  Avoid: Fruit juices with high pulp, prune juice. Condiments  Allowed:Ketchup, mustard, horseradish, vinegar, cream sauce, cheese sauce, cocoa powder. Spices in moderation: allspice, basil, bay leaves, celery powder or leaves, cinnamon, cumin powder, curry powder, ginger, mace, marjoram, onion or garlic powder, oregano, paprika, parsley flakes, ground pepper, rosemary, sage, savory, tarragon, thyme, turmeric.  Avoid: Coconut, pickles. SAMPLE MENU Breakfast   cup orange juice.  1 boiled egg.  1 slice white toast.  Margarine.   cup cornflakes.  1 cup milk.  Beverage. Lunch   cup chicken noodle soup.  2 to 3 oz sliced roast beef.  2 slices white bread.  Mayonnaise.   cup tomato juice.  1 small banana.  Beverage. Dinner  3 oz baked chicken.   cup scalloped potatoes.   cup cooked beets.  White dinner roll.  Margarine.   cup canned peaches.  Beverage. Document Released: 04/07/2002 Document Revised: 01/08/2012 Document Reviewed: 11/02/2011 [facility] Patient Information 2014 [place], [place].

## 2013-08-08 NOTE — Progress Notes (Signed)
Chief Complaint:  Thrice recurrent umbilical hernia  History of Present Illness:  Stacy Frost is an 52 y.o. female mother in law to [employer] who has had an umbilical hernia repair in the past by Dr. Marcell Anger and Jonita Albee and subsequently by Dr. Lovell Sheehan in [place].  She understood Dr. Lovell Sheehan to save the butterfly 20 use mesh and Dr. Lovell Sheehan laid mesh on top of mesh was oriented there. Recently along with a bulge in that area she's had about a 15 pound weight loss and has had a lot of nausea and constipation. She has a bulge there and her umbilical region there was noted she is referred for repair.  I told her I will like to get a CT scan to see which segment of bowel is involved with this as: Her small intestine that is possibly involved with the hernia. That would indicate whether we would need to do a bowel prep or not and could eventually complicated repair with mesh. Also there is negative mesh exposed there could be bowel fused to the which could be a potential problem with repair. Plan to get a CT scan abdomen and pelvis with oral contrast and will see her back in the office and review that with her prior to scheduling her for a laparoscopic ventral hernia repair.  Past Medical History  Diagnosis Date  . Restless leg syndrome   . Skin tag   . Hyperlipidemia   . Obesity   . Pain in joint, ankle and foot     Chronic  . Bicuspid aortic valve     Very mild stenosis 12/12  . Migraine headache   . Essential hypertension, benign   . Type 2 diabetes mellitus   . Anxiety   . History of cardiac catheterization     Normal coronaries 2008  . Palpitations     Documented PACs by previous monitoring  . Heart murmur     Past Surgical History  Procedure Laterality Date  . Cholecystectomy  2001    "Poor EF at 17%"  . Umbilical hernia repair  2003  . Achilles tendon repair  2003    Left  . Cardiac catheterization  2008    Normal coronary arteries; normal LV systolic function; mild dilation  of aortic root; mild dilation of proximal ascending aorta; likely bicuspid aortic valve    Current Outpatient Prescriptions  Medication Sig Dispense Refill  . ALPRAZolam (XANAX) 0.5 MG tablet Take 0.5 mg by mouth daily. Take up to 4 daily.       Marland Kitchen aspirin 325 MG tablet Take 325 mg by mouth daily.      . furosemide (LASIX) 20 MG tablet Take 20 mg by mouth 2 (two) times daily. 40 mg in am and 20 mg pm      . HYDROcodone-acetaminophen (VICODIN ES) 7.5-750 MG per tablet Take 1 tablet by mouth 3 (three) times daily as needed.        Marland Kitchen lisinopril (PRINIVIL,ZESTRIL) 20 MG tablet Take 20 mg by mouth daily.        . metFORMIN (GLUCOPHAGE) 1000 MG tablet Take 1,000 mg by mouth 2 (two) times daily with a meal.      . metoprolol (TOPROL-XL) 200 MG 24 hr tablet Take 200 mg by mouth daily.        . potassium chloride SA (K-DUR,KLOR-CON) 20 MEQ tablet Take 20 mEq by mouth 2 (two) times daily.      . simvastatin (ZOCOR) 40 MG tablet Take 40 mg by  mouth every evening.       No current facility-administered medications for this visit.   Amitriptyline; Amlodipine besylate; Butorphanol tartrate; Morphine and related; and Sumatriptan Family History  Problem Relation Age of Onset  . Depression Mother   . Cancer Mother     Lung mets (unsure as to what kind)  . Hypertension Father   . Depression Father   . Depression Daughter   . Anxiety disorder Daughter    Social History:   reports that she has never smoked. She does not have any smokeless tobacco history on file. She reports that she does not drink alcohol or use illicit drugs.   REVIEW OF SYSTEMS - PERTINENT POSITIVES ONLY: Is positive for unexplained weight loss, abdominal pain, constipation, nausea, vomiting, arthritis, urinary tract infection, and headaches. Otherwise negative.  Physical Exam:   Blood pressure 130/80, pulse 74, temperature 97.3 F (36.3 C), temperature source Temporal, resp. rate 14, height 5\' 6"  (1.676 m), weight 212 lb (96.163  kg). Body mass index is 34.23 kg/(m^2).  Gen:  WDWN white female NAD  Neurological: Alert and oriented to person, place, and time. Motor and sensory function is grossly intact  Head: Normocephalic and atraumatic.  Eyes: Conjunctivae are normal. Pupils are equal, round, and reactive to light. No scleral icterus.  Neck: Normal range of motion. Neck supple. No tracheal deviation or thyromegaly present.  Cardiovascular:  SR without murmurs or gallops.  No carotid bruits Respiratory: Effort normal.  No respiratory distress. No chest wall tenderness. Breath sounds normal.  No wheezes, rales or rhonchi.  Abdomen:  Soft bulging mass just above the umbilicus was out a discrete fascial ring palpable. This could be a complicated sliding hernia involving bowel slipping between the layers of mesh. GU: Musculoskeletal: Normal range of motion. Extremities are nontender. No cyanosis, edema or clubbing noted Lymphadenopathy: No cervical, preauricular, postauricular or axillary adenopathy is present Skin: Skin is warm and dry. No rash noted. No diaphoresis. No erythema. No pallor. Pscyh: Normal mood and affect. Behavior is normal. Judgment and thought content normal.   LABORATORY RESULTS: No results found for this or any previous visit (from the past 48 hour(s)).  RADIOLOGY RESULTS: No results found.  Problem List: Patient Active Problem List   Diagnosis Date Noted  . Ventral hernia, recurrent-s/p ventral hernia repairs by Lupe Carney both with mesh 08/08/2013  . Preoperative evaluation to rule out surgical contraindication 07/12/2012  . HYPERLIPIDEMIA 06/29/2008  . Essential hypertension, benign 06/01/2008  . Bicuspid aortic valve 06/01/2008    Assessment & Plan: Recurrent hernia with evidence of partial small bowel obstruction. I have advised to go on more of low residue liquid type diet. We'll get CT scan abdomen pelvis to assess bowel involvement and we'll see her back in the office  prior to scheduling her.    Matt B. Daphine Deutscher, MD, [facility] [phone] beeper [phone]  08/08/2013 10:17 AM

## 2013-08-12 ENCOUNTER — Ambulatory Visit
Admission: RE | Admit: 2013-08-12 | Discharge: 2013-08-12 | Disposition: A | Discharge status: Home / Self Care | Payer: PRIVATE HEALTH INSURANCE | Source: Ambulatory Visit | Attending: Surgery | Admitting: Surgery

## 2013-08-12 DIAGNOSIS — K469 Unspecified abdominal hernia without obstruction or gangrene: Secondary | ICD-10-CM

## 2013-08-12 MED ORDER — IOHEXOL 300 MG/ML  SOLN
125.0000 mL | Freq: Once | INTRAMUSCULAR | Status: AC | PRN
  Administered 2013-08-12: 125 mL via INTRAVENOUS

## 2013-08-14 ENCOUNTER — Telehealth (INDEPENDENT_AMBULATORY_CARE_PROVIDER_SITE_OTHER): Payer: Self-pay | Admitting: *Deleted

## 2013-08-14 NOTE — Telephone Encounter (Signed)
Patient called regarding her CT scan.  I gave patient the information under the impressions that no acute findings and no recurrent hernia.  Patient just wanting to know the plan moving forward.  Explained that Dr. Daphine Deutscher is unavailable this afternoon however I will talk to him tomorrow when he is in office then we will give her a call back.  Patient states understanding and agreeable at this time.

## 2013-08-15 NOTE — Telephone Encounter (Signed)
Asking DR. Daphine Deutscher to call concerning CT she did not go all weekend without not talking to him about her results and the next step

## 2013-09-04 ENCOUNTER — Other Ambulatory Visit: Payer: Self-pay

## 2013-09-05 ENCOUNTER — Encounter (INDEPENDENT_AMBULATORY_CARE_PROVIDER_SITE_OTHER): Payer: Self-pay | Admitting: Surgery

## 2013-09-05 ENCOUNTER — Ambulatory Visit (INDEPENDENT_AMBULATORY_CARE_PROVIDER_SITE_OTHER): Payer: PRIVATE HEALTH INSURANCE | Admitting: Surgery

## 2013-09-05 VITALS — BP 126/78 | HR 72 | Temp 98.6°F | Resp 15 | Ht 66.0 in | Wt 211.4 lb

## 2013-09-05 DIAGNOSIS — R1013 Epigastric pain: Secondary | ICD-10-CM

## 2013-09-05 DIAGNOSIS — G8929 Other chronic pain: Secondary | ICD-10-CM | POA: Insufficient documentation

## 2013-09-05 NOTE — Patient Instructions (Signed)
Esophagogastroduodenoscopy  Esophagogastroduodenoscopy (EGD) is a procedure to examine the lining of the esophagus, stomach, and first part of the small intestine (duodenum). A long, flexible, lighted tube with a camera attached (endoscope) is inserted down the throat to view these organs. This procedure is done to detect problems or abnormalities, such as inflammation, bleeding, ulcers, or growths, in order to treat them. The procedure lasts about 5 20 minutes. It is usually an outpatient procedure, but it may need to be performed in emergency cases in the hospital.  LET YOUR CAREGIVER KNOW ABOUT:   · Allergies to food or medicine.  · All medicines you are taking, including vitamins, herbs, eyedrops, and over-the-counter medicines and creams.  · Use of steroids (by mouth or creams).  · Previous problems you or members of your family have had with the use of anesthetics.  · Any blood disorders you have.  · Previous surgeries you have had.  · Other health problems you have.  · Possibility of pregnancy, if this applies.  RISKS AND COMPLICATIONS   Generally, EGD is a safe procedure. However, as with any procedure, complications can occur. Possible complications include:  · Infection.  · Bleeding.  · Tearing (perforation) of the esophagus, stomach, or duodenum.  · Difficulty breathing or not being able to breath.  · Excessive sweating.  · Spasms of the larynx.  · Slowed heartbeat.  · Low blood pressure.  BEFORE THE PROCEDURE  · Do not eat or drink anything for 6 8 hours before the procedure or as directed by your caregiver.  · Ask your caregiver about changing or stopping your regular medicines.  · If you wear dentures, be prepared to remove them before the procedure.  · Arrange for someone to drive you home after the procedure.  PROCEDURE   · A vein will be accessed to give medicines and fluids. A medicine to relax you (sedative) and a pain reliever will be given through that access into the vein.  · A numbing medicine  (local anesthetic) may be sprayed on your throat for comfort and to stop you from gagging or coughing.  · A mouth guard may be placed in your mouth to protect your teeth and to keep you from biting on the endoscope.  · You will be asked to lie on your left side.  · The endoscope is inserted down your throat and into the esophagus, stomach, and duodenum.  · Air is put through the endoscope to allow your caregiver to view the lining of your esophagus clearly.  · The esophagus, stomach, and duodenum is then examined. During the exam, your caregiver may:  · Remove tissue to be examined under a microscope (biopsy) for inflammation, infection, or other medical problems.  · Remove growths.  · Remove objects (foreign bodies) that are stuck.  · Treat any bleeding with medicines or other devices that stop tissues from bleeding (hot cauters, clipping devices).  · Widen (dilate) or stretch narrowed areas of the esophagus and stomach.  · The endoscope will then be withdrawn.  AFTER THE PROCEDURE  · You will be taken to a recovery area to be monitored. You will be able to go home once you are stable and alert.  · Do not eat or drink anything until the local anesthetic and numbing medicines have worn off. You may choke.  · It is normal to feel bloated, have pain with swallowing, or have a sore throat for a short time. This will wear off.  ·   Your caregiver should be able to discuss his or her findings with you. It will take longer to discuss the test results if any biopsies were taken.  Document Released: 02/16/2005 Document Revised: 10/02/2012 Document Reviewed: 09/18/2012  Exit[employer]® Patient Information ©2014 [employer].

## 2013-09-05 NOTE — Progress Notes (Signed)
Stacy Frost 52 y.o.  Body mass index is 34.14 kg/(m^2).  Patient Active Problem List   Diagnosis Date Noted  . Preoperative evaluation to rule out surgical contraindication 07/12/2012  . HYPERLIPIDEMIA 06/29/2008  . Essential hypertension, benign 06/01/2008  . Bicuspid aortic valve 06/01/2008    Allergies  Allergen Reactions  . Amitriptyline   . Amlodipine Besylate     REACTION: SOB  . Butorphanol Tartrate     REACTION: ED visit and got for migraine - not sure of response  . Morphine And Related   . Sumatriptan     REACTION: HTN    Past Surgical History  Procedure Laterality Date  . Cholecystectomy  2001    "Poor EF at 17%"  . Umbilical hernia repair  2003  . Achilles tendon repair  2003    Left  . Cardiac catheterization  2008    Normal coronary arteries; normal LV systolic function; mild dilation of aortic root; mild dilation of proximal ascending aorta; likely bicuspid aortic valve   MCINNIS,ANGUS G, MD No diagnosis found.  Followup visit for upper abdominal post prandial pain and nausea (post lap chole) and abdominal wall mass thought to be a ventral hernia but is large asymetrical fat mass.  This might be better managed with liposuction.  I will refer her for a consultation with Dr. Delia Chimes. The persistant upper abdominal pain with nausea may be gastric related and we will refer her to Dr. Bosie Clos for upper endoscopy.    Will see her back as needed. Matt B. Daphine Deutscher, MD, [facility] [phone] beeper [phone]  09/05/2013 3:23 PM

## 2013-10-31 ENCOUNTER — Encounter: Payer: Self-pay | Admitting: *Deleted

## 2013-11-10 ENCOUNTER — Encounter: Payer: Self-pay | Admitting: *Deleted

## 2014-02-03 ENCOUNTER — Ambulatory Visit: Payer: PRIVATE HEALTH INSURANCE | Admitting: Cardiology

## 2014-02-04 ENCOUNTER — Encounter: Payer: Self-pay | Admitting: *Deleted

## 2014-06-23 ENCOUNTER — Ambulatory Visit (INDEPENDENT_AMBULATORY_CARE_PROVIDER_SITE_OTHER): Payer: PRIVATE HEALTH INSURANCE | Admitting: Internal Medicine

## 2014-06-23 ENCOUNTER — Encounter (INDEPENDENT_AMBULATORY_CARE_PROVIDER_SITE_OTHER): Payer: Self-pay | Admitting: Internal Medicine

## 2014-06-23 VITALS — BP 110/68 | HR 72 | Temp 97.2°F | Resp 18 | Ht 66.0 in | Wt 210.1 lb

## 2014-06-23 DIAGNOSIS — R110 Nausea: Secondary | ICD-10-CM

## 2014-06-23 DIAGNOSIS — R1310 Dysphagia, unspecified: Secondary | ICD-10-CM

## 2014-06-23 DIAGNOSIS — R109 Unspecified abdominal pain: Secondary | ICD-10-CM

## 2014-06-23 DIAGNOSIS — R634 Abnormal weight loss: Secondary | ICD-10-CM

## 2014-06-23 MED ORDER — ASPIRIN 81 MG PO TABS: 81.0000 mg | ORAL_TABLET | Freq: Every day | ORAL | Status: DC

## 2014-06-23 MED ORDER — PANTOPRAZOLE SODIUM 40 MG PO TBEC: 40.0000 mg | DELAYED_RELEASE_TABLET | Freq: Every day | ORAL | Status: DC

## 2014-06-23 MED ORDER — POTASSIUM CHLORIDE ER 20 MEQ PO TBCR: 20.0000 meq | EXTENDED_RELEASE_TABLET | Freq: Every day | ORAL | Status: DC | PRN

## 2014-06-23 NOTE — Progress Notes (Signed)
Presenting complaint;  Nausea, anorexia, weight loss and abdominal pain.  History of present illness;  Patient is 53 year old Caucasian female who is referred for courtesy of Dr. Everette Rank for GI evaluation. Patient's present illness began 6 weeks ago with nausea and loss of appetite. She would experience waves of nausea and sweating. About 3 weeks ago nausea became more intractable and her appetite has decreased. Despite intense nausea she has not experienced vomiting. She has noted transient relief with Zofran. She denies heartburn but has noted intermittent nocturnal regurgitation when she has to jump out of bed for relief. She states she has lost 20 pounds in the last 4 weeks. She also complains of intermittent hoarseness and dysphagia. She points to mid sternal area at site of bolus obstruction. She had to regurgitate food bolus 2 times for relief. She also complains of periumbilical pain. She's had this pain for a few years but she says it has gotten worse since her symptoms began. Last year she was seen by Dr. Johnathan Hausen and thought to have a recurrent hernia but CT was negative. She has chills but no fever. She denies headache skin rash joint pains dysuria hematuria or vaginal discharge. She has constipation and uses MiraLax on when necessary basis. Patient started taking phentermine 3 weeks ago in order to lose weight. She took this medication 5 years ago and had no side effects. She does not take NSAIDs. She takes pain medication on when necessary basis for migraine.   Current Medications: Outpatient Encounter Prescriptions as of 06/23/2014  Medication Sig  . Acetaminophen-Codeine (TYLENOL WITH CODEINE #3 PO) Take by mouth. Patient states that as takes as needed  . ALPRAZolam (XANAX) 0.5 MG tablet Take 0.5 mg by mouth daily. Take up to 4 daily.   Marland Kitchen aspirin 325 MG tablet Take 325 mg by mouth daily.  . furosemide (LASIX) 20 MG tablet Take 20 mg by mouth 2 (two) times daily. 40 mg in am and  20 mg pm  . lisinopril (PRINIVIL,ZESTRIL) 20 MG tablet Take 20 mg by mouth daily.    . metFORMIN (GLUCOPHAGE) 1000 MG tablet Take 1,000 mg by mouth 2 (two) times daily with a meal.  . metoprolol (TOPROL-XL) 200 MG 24 hr tablet Take 200 mg by mouth daily.    . phentermine 37.5 MG capsule Take 37.5 mg by mouth every morning.  . potassium chloride SA (K-DUR,KLOR-CON) 20 MEQ tablet Take 20 mEq by mouth 2 (two) times daily.  . simvastatin (ZOCOR) 40 MG tablet Take 40 mg by mouth every evening.  . [DISCONTINUED] HYDROcodone-acetaminophen (VICODIN ES) 7.5-750 MG per tablet Take 1 tablet by mouth 3 (three) times daily as needed.     Past medical history; Bicuspid aortic valve and mild aortic incompetence. Mitral valve prolapse. Hypertension of 20 years duration. Diabetes mellitus was diagnosed 10 years ago. Hemoglobin A1c 2 weeks ago was 8.2; jumped from 6.9 six months ago. Hyperlipidemia. Obesity. She weighed 150 pounds and she was in high school. Stress disorder. History of migraine. History of lower extremity. Umbilical herniorrhaphy 10 years ago with mesh placement and redo 5 years ago. Surgery for rupture of right Achilles tendon 13 years ago. Cholecystectomy 12 years ago for biliary dyskinesia.  Allergies; Allergies  Allergen Reactions  . Amitriptyline   . Amlodipine Besylate     REACTION: SOB  . Butorphanol Tartrate     REACTION: ED visit and got for migraine - not sure of response  . Morphine And Related   . Sumatriptan  REACTION: HTN   Social history; She is married and has 2 children. Her daughter age 34 suffered stroke 2 years ago and now kit for an assisted living she also has diabetes mellitus. She has a son age 39 in good health. She does not work. She's never smoked cigarettes or drank alcohol.  Family history; Father is 33 and has gout; Mother died at age 40 within 36 months of diagnosis because of lung cancer. She has one brother and sister in good health and she  has another other age 58 with multiple medical problems including diabetes mellitus.    Objective: Blood pressure 110/68, pulse 72, temperature 97.2 F (36.2 C), temperature source Oral, resp. rate 18, height _0  (1.676 m), weight 210 lb 1.6 oz (95.301 kg). Patient is alert and in no acute distress. Conjunctiva is pink. Sclera is nonicteric Oropharyngeal mucosa is normal. No neck masses or thyromegaly noted. Cardiac exam with regular rhythm normal S1 and S2. Faint systolic ejection murmur noted at aortic area. No diastolic murmur noted. Lungs are clear to auscultation. Abdomen is full. Bowel sounds are normal. No bruits noted. There is focal prominence above the level of the umbilicus but no cough impulse noted. She has been slight tenderness in epigastrium area and the likely region and hypogastric area. No organomegaly or masses.  No LE edema or clubbing noted.  Labs/studies Results: Abdominopelvic CT with contrast  from October 2014 reviewed. Surgical changes noted along the anterior abdominal wall from previous surgery. Evidence of cholecystectomy and colonic diverticulosis. Bile duct nondilated. Lab data from 06/09/2014. WBC 7.3, H&H 13.5 and 40.5 and platelet count 249K Serum iron 101. TSH 1.26. Serum sodium 131, potassium 4.1, chloride 93, CO2 28, BUN 24 and creatinine 1.38 Glucose 179 Bilirubin 1.2, AP 63, AST 34, ALT 33, total protein 7.9 and albumin 4.5.   Assessment:  #1. Nausea of 6 weeks duration with anorexia and weight loss. She is on full dose aspirin. Recent lab studies are unremarkable other than mild hyponatremia and elevated serum creatinine as well as abdominopelvic CT from October 2014. Differential diagnosis includes peptic ulcer disease or diabetic gastroparesis. Phentermine he be contribution to weight loss. #2. Solid food dysphagia. #3. Abdominal pain appears to be chronic possibly related to to prior surgeries for umbilicus hernia with mesh placement. #4.  Mild hyponatremia and elevated serum creatinine secondary to diuretic therapy.  Recommendations;  Hemoccult x1. Reduce aspirin dose to 81 mg by mouth daily. Patient advised to take furosemide and KCl on an as-needed basis rather than every day. With diminished oral intake she is at risk for dehydration. Pantoprazole 40 mg by mouth every morning. Esophagogastroduodenoscopy and possible esophageal dilation. Finally she will undergo screening colonoscopy when acute symptoms have resolved.

## 2014-06-23 NOTE — Patient Instructions (Signed)
Take furosemide on an as-needed basis. Take potassium chloride on days when you take furosemide. Esophagogastroduodenoscopy and possible dilation to be scheduled. Hemoccult x1

## 2014-06-24 ENCOUNTER — Encounter (INDEPENDENT_AMBULATORY_CARE_PROVIDER_SITE_OTHER): Payer: Self-pay | Admitting: *Deleted

## 2014-06-24 ENCOUNTER — Other Ambulatory Visit (INDEPENDENT_AMBULATORY_CARE_PROVIDER_SITE_OTHER): Payer: Self-pay | Admitting: *Deleted

## 2014-06-24 ENCOUNTER — Telehealth (INDEPENDENT_AMBULATORY_CARE_PROVIDER_SITE_OTHER): Payer: Self-pay | Admitting: *Deleted

## 2014-06-24 DIAGNOSIS — R1310 Dysphagia, unspecified: Secondary | ICD-10-CM

## 2014-06-24 DIAGNOSIS — R110 Nausea: Secondary | ICD-10-CM

## 2014-06-24 DIAGNOSIS — R109 Unspecified abdominal pain: Secondary | ICD-10-CM

## 2014-06-24 DIAGNOSIS — R634 Abnormal weight loss: Secondary | ICD-10-CM

## 2014-06-24 NOTE — Telephone Encounter (Signed)
This has been added to her medication list

## 2014-06-24 NOTE — Telephone Encounter (Signed)
Stacy Frost would like for Dr. Karilyn Cota to know she also takes Victoza 1.8 mg. She forgot to mention this medication at her visit.

## 2014-07-01 ENCOUNTER — Encounter (INDEPENDENT_AMBULATORY_CARE_PROVIDER_SITE_OTHER): Payer: Self-pay | Admitting: *Deleted

## 2014-07-07 ENCOUNTER — Encounter (HOSPITAL_COMMUNITY): Payer: Self-pay | Admitting: Pharmacy Technician

## 2014-07-08 ENCOUNTER — Telehealth (INDEPENDENT_AMBULATORY_CARE_PROVIDER_SITE_OTHER): Payer: Self-pay | Admitting: *Deleted

## 2014-07-08 ENCOUNTER — Other Ambulatory Visit (INDEPENDENT_AMBULATORY_CARE_PROVIDER_SITE_OTHER): Payer: Self-pay | Admitting: *Deleted

## 2014-07-08 DIAGNOSIS — R1310 Dysphagia, unspecified: Secondary | ICD-10-CM

## 2014-07-08 DIAGNOSIS — R110 Nausea: Secondary | ICD-10-CM

## 2014-07-08 DIAGNOSIS — R109 Unspecified abdominal pain: Secondary | ICD-10-CM

## 2014-07-08 DIAGNOSIS — R634 Abnormal weight loss: Secondary | ICD-10-CM

## 2014-07-08 DIAGNOSIS — Z1211 Encounter for screening for malignant neoplasm of colon: Secondary | ICD-10-CM

## 2014-07-08 MED ORDER — PEG-KCL-NACL-NASULF-NA ASC-C 100 G PO SOLR
1.0000 | Freq: Once | ORAL | Status: DC
Start: 2014-07-08 — End: 2014-07-17

## 2014-07-08 NOTE — Telephone Encounter (Signed)
Patient needs movi prep 

## 2014-07-15 ENCOUNTER — Telehealth (INDEPENDENT_AMBULATORY_CARE_PROVIDER_SITE_OTHER): Payer: Self-pay | Admitting: *Deleted

## 2014-07-15 ENCOUNTER — Encounter (INDEPENDENT_AMBULATORY_CARE_PROVIDER_SITE_OTHER): Payer: Self-pay | Admitting: *Deleted

## 2014-07-15 NOTE — Telephone Encounter (Signed)
.    Diagnosis:    Result(s)   Card 1: Negative      Completed by: Barth Trella, LPN   HEMOCCULT SENSA DEVELOPER: LOT#:  [id number] EXPIRATION DATE: 2016-05   HEMOCCULT SENSA CARD:  LOT#: [id number]  EXPIRATION DATE: 09-15  CARD CONTROL RESULTS:  POSITIVE:Positive  NEGATIVE: Negative    ADDITIONAL COMMENTS:   

## 2014-07-15 NOTE — Telephone Encounter (Signed)
Hemoccult is negative. 

## 2014-07-17 ENCOUNTER — Encounter (HOSPITAL_COMMUNITY)
Admission: RE | Disposition: A | Discharge status: Home / Self Care | Payer: Self-pay | Source: Ambulatory Visit | Attending: Internal Medicine

## 2014-07-17 ENCOUNTER — Encounter (HOSPITAL_COMMUNITY): Admission: RE | Payer: Self-pay | Source: Ambulatory Visit

## 2014-07-17 ENCOUNTER — Ambulatory Visit (HOSPITAL_COMMUNITY)
Admission: RE | Admit: 2014-07-17 | Payer: PRIVATE HEALTH INSURANCE | Source: Ambulatory Visit | Admitting: Internal Medicine

## 2014-07-17 ENCOUNTER — Encounter (HOSPITAL_COMMUNITY): Payer: Self-pay

## 2014-07-17 ENCOUNTER — Ambulatory Visit (HOSPITAL_COMMUNITY)
Admission: RE | Admit: 2014-07-17 | Discharge: 2014-07-17 | Disposition: A | Discharge status: Home / Self Care | Payer: PRIVATE HEALTH INSURANCE | Source: Ambulatory Visit | Attending: Internal Medicine | Admitting: Internal Medicine

## 2014-07-17 DIAGNOSIS — K6389 Other specified diseases of intestine: Secondary | ICD-10-CM

## 2014-07-17 DIAGNOSIS — E785 Hyperlipidemia, unspecified: Secondary | ICD-10-CM | POA: Insufficient documentation

## 2014-07-17 DIAGNOSIS — R634 Abnormal weight loss: Secondary | ICD-10-CM

## 2014-07-17 DIAGNOSIS — E119 Type 2 diabetes mellitus without complications: Secondary | ICD-10-CM | POA: Diagnosis not present

## 2014-07-17 DIAGNOSIS — K2289 Other specified disease of esophagus: Secondary | ICD-10-CM

## 2014-07-17 DIAGNOSIS — I10 Essential (primary) hypertension: Secondary | ICD-10-CM | POA: Diagnosis not present

## 2014-07-17 DIAGNOSIS — R109 Unspecified abdominal pain: Secondary | ICD-10-CM | POA: Insufficient documentation

## 2014-07-17 DIAGNOSIS — K208 Other esophagitis without bleeding: Secondary | ICD-10-CM

## 2014-07-17 DIAGNOSIS — R110 Nausea: Secondary | ICD-10-CM

## 2014-07-17 DIAGNOSIS — K228 Other specified diseases of esophagus: Secondary | ICD-10-CM

## 2014-07-17 DIAGNOSIS — Z1211 Encounter for screening for malignant neoplasm of colon: Secondary | ICD-10-CM

## 2014-07-17 DIAGNOSIS — R1310 Dysphagia, unspecified: Secondary | ICD-10-CM

## 2014-07-17 DIAGNOSIS — Z79899 Other long term (current) drug therapy: Secondary | ICD-10-CM | POA: Diagnosis not present

## 2014-07-17 HISTORY — PX: ESOPHAGOGASTRODUODENOSCOPY: SHX5428

## 2014-07-17 HISTORY — PX: MALONEY DILATION: SHX5535

## 2014-07-17 HISTORY — PX: COLONOSCOPY: SHX5424

## 2014-07-17 LAB — GLUCOSE, CAPILLARY: Glucose-Capillary: 162 mg/dL — ABNORMAL HIGH (ref 70–99)

## 2014-07-17 LAB — KOH PREP

## 2014-07-17 SURGERY — EGD (ESOPHAGOGASTRODUODENOSCOPY)
Anesthesia: Moderate Sedation

## 2014-07-17 SURGERY — COLONOSCOPY
Anesthesia: Moderate Sedation

## 2014-07-17 MED ORDER — MEPERIDINE HCL 50 MG/ML IJ SOLN
INTRAMUSCULAR | Status: AC
  Filled 2014-07-17: qty 1

## 2014-07-17 MED ORDER — BUTAMBEN-TETRACAINE-BENZOCAINE 2-2-14 % EX AERO
INHALATION_SPRAY | CUTANEOUS | Status: DC | PRN
  Administered 2014-07-17: 2 via TOPICAL

## 2014-07-17 MED ORDER — SODIUM CHLORIDE 0.9 % IV SOLN
INTRAVENOUS | Status: DC
  Administered 2014-07-17: 12:00:00 via INTRAVENOUS

## 2014-07-17 MED ORDER — DICYCLOMINE HCL 10 MG PO CAPS: 10.0000 mg | ORAL_CAPSULE | Freq: Three times a day (TID) | ORAL | Status: DC

## 2014-07-17 MED ORDER — SUCRALFATE 1 G PO TABS: 2.0000 g | ORAL_TABLET | Freq: Every day | ORAL | Status: DC

## 2014-07-17 MED ORDER — STERILE WATER FOR IRRIGATION IR SOLN
Status: DC | PRN
  Administered 2014-07-17: 12:00:00

## 2014-07-17 MED ORDER — MIDAZOLAM HCL 5 MG/5ML IJ SOLN
INTRAMUSCULAR | Status: AC
  Filled 2014-07-17: qty 5

## 2014-07-17 MED ORDER — MEPERIDINE HCL 50 MG/ML IJ SOLN
INTRAMUSCULAR | Status: DC | PRN
  Administered 2014-07-17 (×4): 25 mg via INTRAVENOUS

## 2014-07-17 MED ORDER — MEPERIDINE HCL 50 MG/ML IJ SOLN
INTRAMUSCULAR | Status: DC
Start: 2014-07-17 — End: 2014-07-17
  Filled 2014-07-17: qty 1

## 2014-07-17 MED ORDER — MIDAZOLAM HCL 5 MG/5ML IJ SOLN
INTRAMUSCULAR | Status: DC | PRN
  Administered 2014-07-17 (×3): 3 mg via INTRAVENOUS
  Administered 2014-07-17: 2 mg via INTRAVENOUS
  Administered 2014-07-17 (×3): 3 mg via INTRAVENOUS

## 2014-07-17 MED ORDER — MIDAZOLAM HCL 5 MG/5ML IJ SOLN
INTRAMUSCULAR | Status: AC
  Filled 2014-07-17: qty 10

## 2014-07-17 NOTE — Telephone Encounter (Signed)
Patient was called . Cell phone voicemail was full. Home phone called and it rang numerous times with no answer or voice mail coming on.

## 2014-07-17 NOTE — Op Note (Signed)
EGD, ED COLONOSCOPY PROCEDURE REPORT  PATIENT:  Stacy Frost  MR#:  [id number] Birthdate:  12/10/1960, 53 y.o., female Endoscopist:  Dr. Malissa Hippo, MD Referred By:  Dr. Ishmael Holter. Renard Matter, MD Procedure Date: 07/17/2014  Procedure:   EGD, ED & Colonoscopy  Indications:  Patient is a 52-year-old Caucasian female who presents with nausea and abdominal pain weight loss and dysphagia. She is undergoing EGD to ED and average risk screening colonoscopy            Informed Consent:  The risks, benefits, alternatives & imponderables which include, but are not limited to, bleeding, infection, perforation, drug reaction and potential missed lesion have been reviewed.  The potential for biopsy, lesion removal, esophageal dilation, etc. have also been discussed.  Questions have been answered.  All parties agreeable.  Please see history & physical in medical record for more information.  Medications:  Demerol 100 mg IV Versed 20 mg IV Cetacaine spray topically for oropharyngeal anesthesia  EGD  Description of procedure:  The endoscope was introduced through the mouth and advanced to the second portion of the duodenum without difficulty or limitations. The mucosal surfaces were surveyed very carefully during advancement of the scope and upon withdrawal.  Findings:  Esophagus: There was patchy whitish material coating mucosa at esophageal  body possibly food debris.  Focal erythema and edema noted at GE junction. No stricture or ring present. GEJ:  38 cm. Stomach:  There was some bile in the stomach but no food debris. Stomach distended very well with insufflation. Folds in the proximal stomach were normal. Examination of the mucosa at body and antrum was normal. Single small antral diverticulum noted. Pyloric channel was patent. Angularis fundus and cardia were normal. Duodenum:  Normal bulbar and post or mucosa.  Therapeutic/Diagnostic Maneuvers Performed:   Esophagus dilated by passing a 56 French  bony dilator followed insertion. As the dilator was withdrawn the endoscope was passed again and no mucosal disruption noted. Brushings was taken from esophageal body for KOH prep.  COLONOSCOPY Description of procedure:  After a digital rectal exam was performed, that colonoscope was advanced from the anus through the rectum and colon to the area of the cecum, ileocecal valve and appendiceal orifice. The cecum was deeply intubated. These structures were well-seen and photographed for the record. From the level of the cecum and ileocecal valve, the scope was slowly and cautiously withdrawn. The mucosal surfaces were carefully surveyed utilizing scope tip to flexion to facilitate fold flattening as needed. The scope was pulled down into the rectum where a thorough exam including retroflexion was performed.  Findings:   Prep excellent. Normal mucosa of cecum, ascending colon, hepatic flexure, transverse colon, splenic flexure, descending and sigmoid colon. Normal rectal mucosa. Single small anal papilla.  Therapeutic/Diagnostic Maneuvers Performed:  None  Complications:  None  Cecal Withdrawal Time:  9 minutes  Impression:  EGD findings Patchy coating of this the fascial mucosa possibly food debris. Brushing taken for KOH prep. 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 No evidence of stricture or ring. Small gastric diverticulum and antrum. Esophagus dilated by passing a 56 French bony dilator but no mucosal disruption induced.  Colonoscopy findings; Small anal papilla otherwise normal colonoscopy.  Recommendations:  Sucralfate 2 g by mouth each bedtime. Dicyclomine 10 mg by mouth for each meal. I will be contacting patient with the results of brushing. If symptoms persist will ask Dr. Renard Matter if she could come off metformin.  REHMAN,NAJEEB U  07/17/2014 12:49 PM  CC: Dr. Alice Reichert, MD & Dr. Bonnetta Barry ref. provider found

## 2014-07-17 NOTE — H&P (Addendum)
Stacy Frost is an 53 y.o. female.   Chief Complaint: Patient is here for EGD and ED. HPI: Patient is a 53 year old Caucasian female who presents every week history of nausea anorexia weight loss as well as anorectal pain. She's also complaining of tantrum and was discontinued but she does not feeling better. Now she also complains of postprandial abdominal pain. She denies melena or rectal bleeding. Her stool was guaiac negative. Patient reports some improvement in her nausea with pantoprazole. Please refer to my office note from 06/23/2014 for further details. She is also undergoing screening colonoscopy.  Past Medical History  Diagnosis Date  . Restless leg syndrome   . Skin tag   . Hyperlipidemia   . Obesity   . Pain in joint, ankle and foot     Chronic  . Bicuspid aortic valve     Very mild stenosis 12/12  . Migraine headache   . Essential hypertension, benign   . Type 2 diabetes mellitus   . Anxiety   . History of cardiac catheterization     Normal coronaries 2008  . Palpitations     Documented PACs by previous monitoring  . Heart murmur     Past Surgical History  Procedure Laterality Date  . Cholecystectomy  2001    "Poor EF at 17%"  . Umbilical hernia repair  2003  . Achilles tendon repair  2003    Left  . Cardiac catheterization  2008    Normal coronary arteries; normal LV systolic function; mild dilation of aortic root; mild dilation of proximal ascending aorta; likely bicuspid aortic valve    Family History  Problem Relation Age of Onset  . Depression Mother   . Cancer Mother     Lung mets (unsure as to what kind)  . Hypertension Father   . Depression Father   . Heart attack Father   . Depression Daughter   . Anxiety disorder Daughter   . Stroke Daughter    Social History:  reports that she has never smoked. She has never used smokeless tobacco. She reports that she does not drink alcohol or use illicit drugs.  Allergies:  Allergies  Allergen Reactions   . Amitriptyline   . Amlodipine Besylate     REACTION: SOB  . Butorphanol Tartrate     REACTION: ED visit and got for migraine - not sure of response  . Compazine [Prochlorperazine Edisylate]   . Morphine And Related   . Sumatriptan     REACTION: HTN    Medications Prior to Admission  Medication Sig Dispense Refill  . acetaminophen-codeine (TYLENOL #3) 300-30 MG per tablet Take 1 tablet by mouth every 4 (four) hours as needed for moderate pain.      Marland Kitchen ALPRAZolam (XANAX) 0.5 MG tablet Take 0.5 mg by mouth every 6 (six) hours as needed for anxiety.       Marland Kitchen aspirin 81 MG tablet Take 1 tablet (81 mg total) by mouth daily.  30 tablet    . furosemide (LASIX) 20 MG tablet Take 20 mg by mouth 2 (two) times daily as needed for fluid.       . Liraglutide (VICTOZA [place]) Inject 1.8 mg into the skin daily.      Marland Kitchen lisinopril (PRINIVIL,ZESTRIL) 20 MG tablet Take 20 mg by mouth daily.        . metFORMIN (GLUCOPHAGE) 1000 MG tablet Take 1,000 mg by mouth 2 (two) times daily with a meal.      . metoprolol (TOPROL-XL)  200 MG 24 hr tablet Take 200 mg by mouth daily.        . pantoprazole (PROTONIX) 40 MG tablet Take 1 tablet (40 mg total) by mouth daily.  60 tablet  3  . peg 3350 powder (MOVIPREP) 100 G SOLR Take 1 kit (200 g total) by mouth once.  1 kit  0  . polyethylene glycol (MIRALAX / GLYCOLAX) packet Take 17 g by mouth daily.      . Potassium Chloride ER 20 MEQ TBCR Take 20 mEq by mouth daily as needed. Take potassium on days when you take furosemide  30 tablet  2  . simvastatin (ZOCOR) 40 MG tablet Take 40 mg by mouth every evening.        Results for orders placed during the hospital encounter of 07/17/14 (from the past 48 hour(s))  GLUCOSE, CAPILLARY     Status: Abnormal   Collection Time    07/17/14 11:33 AM      Result Value Ref Range   Glucose-Capillary 162 (*) 70 - 99 mg/dL   No results found.  ROS  Blood pressure 117/65, pulse 84, temperature 98.2 F (36.8 C), temperature source  Oral, resp. rate 15, height 5' 6"  (1.676 m), weight 210 lb (95.255 kg), SpO2 97.00%. Physical Exam  Constitutional: She appears well-developed and well-nourished.  HENT:  Mouth/Throat: Oropharynx is clear and moist.  Eyes: Conjunctivae are normal. No scleral icterus.  Neck: No thyromegaly present.  Cardiovascular:  Murmur: faint SEM at LLSB. GI: Soft. She exhibits no mass. Tenderness: mild tenderness epigastrium and the umbilicus region as well as hypogastric   Musculoskeletal: She exhibits no edema.  Lymphadenopathy:    She has no cervical adenopathy.  Neurological: She is alert.  Skin: Skin is warm and dry.     Assessment/Plan Nausea anorexia abdominal pain and weight loss. Dysphagia. EGD with ED. Average risk screening colonoscopy.  Stacy Frost U 07/17/2014, 11:49 AM

## 2014-07-17 NOTE — Discharge Instructions (Signed)
Resume usual medications and diet. Sucralfate 2 g almost daily at bedtime. Dicyclomine 10 mg by mouth 15-30 minutes before each meal. No driving for 24 hours. Physician will call with results of brushing from esophagus.   Colonoscopy, Care After Refer to this sheet in the next few weeks. These instructions provide you with information on caring for yourself after your procedure. Your health care provider may also give you more specific instructions. Your treatment has been planned according to current medical practices, but problems sometimes occur. Call your health care provider if you have any problems or questions after your procedure. WHAT TO EXPECT AFTER THE PROCEDURE  After your procedure, it is typical to have the following:  A small amount of blood in your stool.  Moderate amounts of gas and mild abdominal cramping or bloating. HOME CARE INSTRUCTIONS  Do not drive, operate machinery, or sign important documents for 24 hours.  You may shower and resume your regular physical activities, but move at a slower pace for the first 24 hours.  Take frequent rest periods for the first 24 hours.  Walk around or put a warm pack on your abdomen to help reduce abdominal cramping and bloating.  Drink enough fluids to keep your urine clear or pale yellow.  You may resume your normal diet as instructed by your health care provider. Avoid heavy or fried foods that are hard to digest.  Avoid drinking alcohol for 24 hours or as instructed by your health care provider.  Only take over-the-counter or prescription medicines as directed by your health care provider.  If a tissue sample (biopsy) was taken during your procedure:  Do not take aspirin or blood thinners for 7 days, or as instructed by your health care provider.  Do not drink alcohol for 7 days, or as instructed by your health care provider.  Eat soft foods for the first 24 hours. SEEK MEDICAL CARE IF: You have persistent spotting  of blood in your stool 2-3 days after the procedure. SEEK IMMEDIATE MEDICAL CARE IF:  You have more than a small spotting of blood in your stool.  You pass large blood clots in your stool.  Your abdomen is swollen (distended).  You have nausea or vomiting.  You have a fever.  You have increasing abdominal pain that is not relieved with medicine. Document Released: 05/30/2004 Document Revised: 08/06/2013 Document Reviewed: 06/23/2013 [facility] Patient Information 2015 [place], [place]. This information is not intended to replace advice given to you by your health care provider. Make sure you discuss any questions you have with your health care provider.  Esophagogastroduodenoscopy Care After Refer to this sheet in the next few weeks. These instructions provide you with information on caring for yourself after your procedure. Your caregiver may also give you more specific instructions. Your treatment has been planned according to current medical practices, but problems sometimes occur. Call your caregiver if you have any problems or questions after your procedure.  HOME CARE INSTRUCTIONS  Do not eat or drink anything until the numbing medicine (local anesthetic) has worn off and your gag reflex has returned. You will know that the local anesthetic has worn off when you can swallow comfortably.  Do not drive for 12 hours after the procedure or as directed by your caregiver.  Only take medicines as directed by your caregiver. SEEK MEDICAL CARE IF:   You cannot stop coughing.  You are not urinating at all or less than usual. SEEK IMMEDIATE MEDICAL CARE IF:  You  have difficulty swallowing.  You cannot eat or drink.  You have worsening throat or chest pain.  You have dizziness, lightheadedness, or you faint.  You have nausea or vomiting.  You have chills.  You have a fever.  You have severe abdominal pain.  You have black, tarry, or bloody  stools.   Diverticulosis Diverticulosis is the condition that develops when small pouches (diverticula) form in the wall of your colon. Your colon, or large intestine, is where water is absorbed and stool is formed. The pouches form when the inside layer of your colon pushes through weak spots in the outer layers of your colon. CAUSES  No one knows exactly what causes diverticulosis. RISK FACTORS  Being older than 50. Your risk for this condition increases with age. Diverticulosis is rare in people younger than 40 years. By age 58, almost everyone has it.  Eating a low-fiber diet.  Being frequently constipated.  Being overweight.  Not getting enough exercise.  Smoking.  Taking over-the-counter pain medicines, like aspirin and ibuprofen. SYMPTOMS  Most people with diverticulosis do not have symptoms. DIAGNOSIS  Because diverticulosis often has no symptoms, health care providers often discover the condition during an exam for other colon problems. In many cases, a health care provider will diagnose diverticulosis while using a flexible scope to examine the colon (colonoscopy). TREATMENT  If you have never developed an infection related to diverticulosis, you may not need treatment. If you have had an infection before, treatment may include:  Eating more fruits, vegetables, and grains.  Taking a fiber supplement.  Taking a live bacteria supplement (probiotic).  Taking medicine to relax your colon. HOME CARE INSTRUCTIONS   Drink at least 6-8 glasses of water each day to prevent constipation.  Try not to strain when you have a bowel movement.  Keep all follow-up appointments. If you have had an infection before:  Increase the fiber in your diet as directed by your health care provider or dietitian.  Take a dietary fiber supplement if your health care provider approves.  Only take medicines as directed by your health care provider. SEEK MEDICAL CARE IF:   You have  abdominal pain.  You have bloating.  You have cramps.  You have not gone to the bathroom in 3 days. SEEK IMMEDIATE MEDICAL CARE IF:   Your pain gets worse.  Yourbloating becomes very bad.  You have a fever or chills, and your symptoms suddenly get worse.  You begin vomiting.  You have bowel movements that are bloody or black. MAKE SURE YOU:  Understand these instructions.  Will watch your condition.  Will get help right away if you are not doing well or get worse.

## 2014-07-25 ENCOUNTER — Other Ambulatory Visit (INDEPENDENT_AMBULATORY_CARE_PROVIDER_SITE_OTHER): Payer: Self-pay | Admitting: Internal Medicine

## 2014-07-25 MED ORDER — NYSTATIN 100000 UNIT/ML MT SUSP: 5.0000 mL | Freq: Four times a day (QID) | OROMUCOSAL | Status: DC

## 2014-09-01 ENCOUNTER — Ambulatory Visit (INDEPENDENT_AMBULATORY_CARE_PROVIDER_SITE_OTHER): Payer: PRIVATE HEALTH INSURANCE | Admitting: Internal Medicine

## 2014-09-14 ENCOUNTER — Encounter (INDEPENDENT_AMBULATORY_CARE_PROVIDER_SITE_OTHER): Payer: Self-pay | Admitting: Internal Medicine

## 2014-09-14 ENCOUNTER — Ambulatory Visit (INDEPENDENT_AMBULATORY_CARE_PROVIDER_SITE_OTHER): Payer: PRIVATE HEALTH INSURANCE | Admitting: Internal Medicine

## 2014-09-14 VITALS — BP 128/78 | HR 80 | Temp 98.4°F | Ht 65.0 in | Wt 212.4 lb

## 2014-09-14 DIAGNOSIS — J028 Acute pharyngitis due to other specified organisms: Secondary | ICD-10-CM

## 2014-09-14 NOTE — Patient Instructions (Signed)
Z pack. Throat lozengers. If dysphagia occurs, please call our office.

## 2014-09-14 NOTE — Progress Notes (Signed)
Subjective:    Patient ID: Stacy SallesLisa Frost, female    DOB: 1961-01-08, 53 y.o.   MRN: [id number]  HPI Here today after seeing Dr. Renard MatterMcInnis for sorethroat Symptoms started yesterday around 3 am.  She has pain when she swallows. .  She drank water all day long yesterday. She says when she drinks or eats she has chest pain. She saw Dr. Renard MatterMcInnis this am and was sent to our office.  She also has hoarseness and an earache. Strep screen in Dr. Lorenso CourierMcInnis's  office was negative. She says when she eats solids, she has a burning sensation in her chest. There has been no fever.  Appetite is not good. She says it hurts to swallow. She says foods are not lodging.. BMs are normal.  Some constipation. Using Miralax prn.  07/17/2014 EGD/ED  Impression:  EGD findings Patchy coating of this the fascial mucosa possibly food debris. Brushing taken for KOH prep. 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 No evidence of stricture or ring. Small gastric diverticulum and antrum. Esophagus dilated by passing a 56 French bony dilator but no mucosal disruption induced.  KOH prep is positive Results reviewed with the patient Mycostatin prescription sent to pharmacy Patient will double up on dicyclomine for abdominal cramps. He should keep symptom diary and followup with PCP about getting off metformin   Review of Systems Past Medical History  Diagnosis Date  . Restless leg syndrome   . Skin tag   . Hyperlipidemia   . Obesity   . Pain in joint, ankle and foot     Chronic  . Bicuspid aortic valve     Very mild stenosis 12/12  . Migraine headache   . Essential hypertension, benign   . Type 2 diabetes mellitus   . Anxiety   . History of cardiac catheterization     Normal coronaries 2008  . Palpitations     Documented PACs by previous monitoring  . Heart murmur     Past Surgical History  Procedure Laterality Date  . Cholecystectomy  2001    "Poor EF at 17%"  . Umbilical hernia repair  2003    . Achilles tendon repair  2003    Left  . Cardiac catheterization  2008    Normal coronary arteries; normal LV systolic function; mild dilation of aortic root; mild dilation of proximal ascending aorta; likely bicuspid aortic valve  . Colonoscopy N/A 07/17/2014    Procedure: COLONOSCOPY;  Surgeon: Malissa HippoNajeeb U Rehman, MD;  Location: [facility];  Service: Endoscopy;  Laterality: N/A;  105  . Esophagogastroduodenoscopy N/A 07/17/2014    Procedure: ESOPHAGOGASTRODUODENOSCOPY (EGD);  Surgeon: Malissa HippoNajeeb U Rehman, MD;  Location: [facility];  Service: Endoscopy;  Laterality: N/A;  [name]. Maloney dilation N/A 07/17/2014    Procedure: Elease HashimotoMALONEY DILATION;  Surgeon: Malissa HippoNajeeb U Rehman, MD;  Location: [facility];  Service: Endoscopy;  Laterality: N/A;    Allergies  Allergen Reactions  . Amitriptyline   . Amlodipine Besylate     REACTION: SOB  . Butorphanol Tartrate     REACTION: ED visit and got for migraine - not sure of response  . Compazine [Prochlorperazine Edisylate]   . Morphine And Related   . Sumatriptan     REACTION: HTN    Current Outpatient Prescriptions on File Prior to Visit  Medication Sig Dispense Refill  . acetaminophen-codeine (TYLENOL #3) 300-30 MG per tablet Take 1 tablet by mouth every 4 (four) hours as needed for moderate pain.    .Marland Kitchen  ALPRAZolam (XANAX) 0.5 MG tablet Take 0.5 mg by mouth every 6 (six) hours as needed for anxiety.     Marland Kitchen. aspirin 81 MG tablet Take 1 tablet (81 mg total) by mouth daily. 30 tablet   . Liraglutide (VICTOZA [place]) Inject 1.8 mg into the skin daily.    Marland Kitchen. lisinopril (PRINIVIL,ZESTRIL) 20 MG tablet Take 20 mg by mouth daily.      . metoprolol (TOPROL-XL) 200 MG 24 hr tablet Take 200 mg by mouth daily.      . pantoprazole (PROTONIX) 40 MG tablet Take 1 tablet (40 mg total) by mouth daily. 60 tablet 3  . polyethylene glycol (MIRALAX / GLYCOLAX) packet Take 17 g by mouth daily.    . simvastatin (ZOCOR) 40 MG tablet Take 40 mg by mouth every evening.     No current  facility-administered medications on file prior to visit.        Objective:   Physical Exam  Filed Vitals:   09/14/14 0949  Height: 5\' 5"  (1.651 m)  Weight: 212 lb 6.4 oz (96.344 kg)  Alert and oriented. Skin warm and dry. Oral mucosa is moist.   . Sclera anicteric, conjunctivae is pink. Thyroid not enlarged. No cervical lymphadenopathy. Lungs clear. Heart regular rate and rhythm.  Abdomen is soft. Bowel sounds are positive. No hepatomegaly. No abdominal masses felt. No tenderness.  No edema to lower extremities.           Assessment & Plan:  Pharyngitis; Strep screen negative at Dr. Lorenso CourierMcInnis's  Office. Discussed with Dr. Karilyn Cotaehman. Z pak. OTC throat lozengers.

## 2014-09-15 ENCOUNTER — Telehealth (INDEPENDENT_AMBULATORY_CARE_PROVIDER_SITE_OTHER): Payer: Self-pay | Admitting: *Deleted

## 2014-09-15 ENCOUNTER — Emergency Department (HOSPITAL_COMMUNITY)
Admission: EM | Admit: 2014-09-15 | Discharge: 2014-09-15 | Disposition: A | Discharge status: Home / Self Care | Payer: PRIVATE HEALTH INSURANCE | Attending: Emergency Medicine | Admitting: Emergency Medicine

## 2014-09-15 ENCOUNTER — Encounter (HOSPITAL_COMMUNITY): Payer: Self-pay | Admitting: *Deleted

## 2014-09-15 ENCOUNTER — Emergency Department (HOSPITAL_COMMUNITY): Payer: PRIVATE HEALTH INSURANCE

## 2014-09-15 DIAGNOSIS — F419 Anxiety disorder, unspecified: Secondary | ICD-10-CM | POA: Insufficient documentation

## 2014-09-15 DIAGNOSIS — K209 Esophagitis, unspecified without bleeding: Secondary | ICD-10-CM

## 2014-09-15 DIAGNOSIS — Z792 Long term (current) use of antibiotics: Secondary | ICD-10-CM | POA: Diagnosis not present

## 2014-09-15 DIAGNOSIS — Z9889 Other specified postprocedural states: Secondary | ICD-10-CM | POA: Insufficient documentation

## 2014-09-15 DIAGNOSIS — Z79899 Other long term (current) drug therapy: Secondary | ICD-10-CM | POA: Insufficient documentation

## 2014-09-15 DIAGNOSIS — E669 Obesity, unspecified: Secondary | ICD-10-CM | POA: Diagnosis not present

## 2014-09-15 DIAGNOSIS — R110 Nausea: Secondary | ICD-10-CM

## 2014-09-15 DIAGNOSIS — R011 Cardiac murmur, unspecified: Secondary | ICD-10-CM | POA: Insufficient documentation

## 2014-09-15 DIAGNOSIS — Z7982 Long term (current) use of aspirin: Secondary | ICD-10-CM | POA: Insufficient documentation

## 2014-09-15 DIAGNOSIS — Q231 Congenital insufficiency of aortic valve: Secondary | ICD-10-CM | POA: Insufficient documentation

## 2014-09-15 DIAGNOSIS — E785 Hyperlipidemia, unspecified: Secondary | ICD-10-CM | POA: Insufficient documentation

## 2014-09-15 DIAGNOSIS — Z872 Personal history of diseases of the skin and subcutaneous tissue: Secondary | ICD-10-CM | POA: Insufficient documentation

## 2014-09-15 DIAGNOSIS — G43909 Migraine, unspecified, not intractable, without status migrainosus: Secondary | ICD-10-CM | POA: Diagnosis not present

## 2014-09-15 DIAGNOSIS — I10 Essential (primary) hypertension: Secondary | ICD-10-CM | POA: Insufficient documentation

## 2014-09-15 DIAGNOSIS — E119 Type 2 diabetes mellitus without complications: Secondary | ICD-10-CM | POA: Diagnosis not present

## 2014-09-15 DIAGNOSIS — R079 Chest pain, unspecified: Secondary | ICD-10-CM

## 2014-09-15 LAB — CBC WITH DIFFERENTIAL/PLATELET
BASOS ABS: 0 10*3/uL (ref 0.0–0.1)
Basophils Relative: 0 % (ref 0–1)
EOS PCT: 1 % (ref 0–5)
Eosinophils Absolute: 0.1 10*3/uL (ref 0.0–0.7)
HCT: 34.9 % — ABNORMAL LOW (ref 36.0–46.0)
Hemoglobin: 11.7 g/dL — ABNORMAL LOW (ref 12.0–15.0)
LYMPHS PCT: 28 % (ref 12–46)
Lymphs Abs: 2.1 10*3/uL (ref 0.7–4.0)
MCH: 29.3 pg (ref 26.0–34.0)
MCHC: 33.5 g/dL (ref 30.0–36.0)
MCV: 87.5 fL (ref 78.0–100.0)
Monocytes Absolute: 0.4 10*3/uL (ref 0.1–1.0)
Monocytes Relative: 5 % (ref 3–12)
NEUTROS ABS: 4.9 10*3/uL (ref 1.7–7.7)
NEUTROS PCT: 66 % (ref 43–77)
PLATELETS: 263 10*3/uL (ref 150–400)
RBC: 3.99 MIL/uL (ref 3.87–5.11)
RDW: 14 % (ref 11.5–15.5)
WBC: 7.5 10*3/uL (ref 4.0–10.5)

## 2014-09-15 LAB — BASIC METABOLIC PANEL
Anion gap: 14 (ref 5–15)
BUN: 15 mg/dL (ref 6–23)
CHLORIDE: 97 meq/L (ref 96–112)
CO2: 27 mEq/L (ref 19–32)
CREATININE: 0.85 mg/dL (ref 0.50–1.10)
Calcium: 9.7 mg/dL (ref 8.4–10.5)
GFR calc non Af Amer: 76 mL/min — ABNORMAL LOW (ref >90)
GFR, EST AFRICAN AMERICAN: 88 mL/min — AB (ref >90)
Glucose, Bld: 155 mg/dL — ABNORMAL HIGH (ref 70–99)
Potassium: 4.6 mEq/L (ref 3.7–5.3)
SODIUM: 138 meq/L (ref 137–147)

## 2014-09-15 LAB — PRO B NATRIURETIC PEPTIDE: PRO B NATRI PEPTIDE: 52.9 pg/mL (ref 0–125)

## 2014-09-15 LAB — TROPONIN I

## 2014-09-15 MED ORDER — PANTOPRAZOLE SODIUM 40 MG PO TBEC: DELAYED_RELEASE_TABLET | ORAL | Status: DC

## 2014-09-15 MED ORDER — GI COCKTAIL ~~LOC~~
30.0000 mL | Freq: Once | ORAL | Status: AC
  Administered 2014-09-15: 30 mL via ORAL
  Filled 2014-09-15: qty 30

## 2014-09-15 MED ORDER — SUCRALFATE 1 G PO TABS: 1.0000 g | ORAL_TABLET | Freq: Three times a day (TID) | ORAL | Status: DC

## 2014-09-15 NOTE — Telephone Encounter (Signed)
Stacy Frost calls in patient pain is much worse.  Did have some nausea last evening --no gallbadder--no foods seem to cause it.  Chest mid area hurt as if labor pains in her chest--she does have h/o mitral valve issues.  Huring into should area.  Feels like knives cutting her.  She does have h/o yeast in esophagus that Dr. Karilyn Cotaehman treated at last EGD recently.  Is having choking spells today --really not that way yesterday.  When she moves around hurts   She rates her pain today around a 10 where yesterday was 7-8.  Terri said she thought she had pharnygitis--gave z-pack.  No fever --no vomiting  No abd/stomach burning   Able to drink water--baby sips.  Felt like spasms yesterday --more painful today  She is worried about cardiac issues but Dr.McInnis didn't feel this was the case yesterday.  Uses [facility]

## 2014-09-15 NOTE — ED Notes (Signed)
Chest pain, "sharp",  Seen by MD yesterday for sore throat , Today having chest pain with sob

## 2014-09-15 NOTE — Telephone Encounter (Signed)
Per Dr.Rehman - the patient should go to the ED for further evaluation. It is noted that he found yeast at previous EGD,he states that her symptoms would not be that of yeast. Due to patient's cardiac history it is felt that the patient be evaluated at ED. Patient called and made aware.

## 2014-09-15 NOTE — ED Notes (Signed)
Patient verbalizes understanding of discharge instructions, prescription medications, and follow up care. Patient ambulatory out of department at this time with family. 

## 2014-09-15 NOTE — ED Provider Notes (Signed)
CSN: [id number]     Arrival date & time 09/15/14  1755 History   First MD Initiated Contact with Patient 09/15/14 1801     Chief Complaint  Patient presents with  . Chest Pain     (Consider location/radiation/quality/duration/timing/severity/associated sxs/prior Treatment) HPI  Patient reports she had a earache and a sore throat yesterday and she was seen by her primary care doctor. She also reports she was having a center chest pain when she swallowed that she described as sharp. Today however the pain has become more constant and is worse when she has any type of movement. She states she feels short of breath and has dyspnea on exertion. Patient's family states she had choking episodes for years and started having some difficulty swallowing for several months until she had endoscopy done by Dr. Arcelia Jew few months ago and she was found to have esophageal stricture and had a dilatation done. She reports sometimes at night she gets acid that goes back up into her throat. She has had nausea and had some vomiting last week. She denies any fever. She states when she vacuums today she started having diaphoresis. She denies any swelling or pain in her legs. She has not had these problems before.  Patient also has had a hernia repair done twice and has been having abdominal pain since July. She had a colonoscopy and endoscopy done to evaluate that.  Patient states she has an abnormal aortic and mitral valve. She does take SBE prophylaxis before dental work. She did have a normal cardiac cath in 2008 with no coronary blockages. She reports her CBGs in the morning are around 150 although last week they were in the 200s.   PCP Dr Renard Matter GI Dr Karilyn Cota Cardiology Dr Diona Browner  Past Medical History  Diagnosis Date  . Restless leg syndrome   . Skin tag   . Hyperlipidemia   . Obesity   . Pain in joint, ankle and foot     Chronic  . Bicuspid aortic valve     Very mild stenosis 12/12  . Migraine  headache   . Essential hypertension, benign   . Type 2 diabetes mellitus   . Anxiety   . History of cardiac catheterization     Normal coronaries 2008  . Palpitations     Documented PACs by previous monitoring  . Heart murmur    Past Surgical History  Procedure Laterality Date  . Cholecystectomy  2001    "Poor EF at 17%"  . Umbilical hernia repair  2003  . Achilles tendon repair  2003    Left  . Cardiac catheterization  2008    Normal coronary arteries; normal LV systolic function; mild dilation of aortic root; mild dilation of proximal ascending aorta; likely bicuspid aortic valve  . Colonoscopy N/A 07/17/2014    Procedure: COLONOSCOPY;  Surgeon: Malissa Hippo, MD;  Location: [facility];  Service: Endoscopy;  Laterality: N/A;  105  . Esophagogastroduodenoscopy N/A 07/17/2014    Procedure: ESOPHAGOGASTRODUODENOSCOPY (EGD);  Surgeon: Malissa Hippo, MD;  [facility];  Service: Endoscopy;  Laterality: N/A;  [name] dilation N/A 07/17/2014    Procedure: [name] DILATION;  Surgeon: Malissa Hippo, MD;  Location: [facility];  Service: Endoscopy;  Laterality: N/A;  . Knee surgery     Family History  Problem Relation Age of Onset  . Depression Mother   . Cancer Mother     Lung mets (unsure as to what kind)  .  Hypertension Father   . Depression Father   . Heart attack Father   . Depression Daughter   . Anxiety disorder Daughter   . Stroke Daughter    History  Substance Use Topics  . Smoking status: Never Smoker   . Smokeless tobacco: Never Used  . Alcohol Use: No   Stay at home mother  OB History    No data available     Review of Systems  All other systems reviewed and are negative.     Allergies  Amlodipine besylate; Morphine and related; Amitriptyline; Butorphanol tartrate; Compazine; and Sumatriptan  Home Medications   Prior to Admission medications   Medication Sig Start Date End Date Taking? Authorizing Provider  acetaminophen-codeine  (TYLENOL #3) 300-30 MG per tablet Take 1 tablet by mouth every 4 (four) hours as needed. For pain 09/14/14  Yes Historical Provider, MD  ALPRAZolam Prudy Feeler(XANAX) 0.5 MG tablet Take 1 tablet by mouth 3 (three) times daily as needed. For anxiety 09/12/14  Yes Historical Provider, MD  aspirin EC 81 MG tablet Take 81 mg by mouth every morning.   Yes Historical Provider, MD  azithromycin (ZITHROMAX) 250 MG tablet Take 250-500 mg by mouth See admin instructions. Take two tablets on day 1, then take one daily for 5 days.   Yes Historical Provider, MD  canagliflozin (INVOKANA) 100 MG TABS tablet Take 100 mg by mouth daily.   Yes Historical Provider, MD  dicyclomine (BENTYL) 10 MG capsule Take 10 mg by mouth 4 (four) times daily -  before meals and at bedtime.   Yes Historical Provider, MD  Liraglutide (VICTOZA [place]) Inject 1.8 mg into the skin daily.   Yes Historical Provider, MD  lisinopril (PRINIVIL,ZESTRIL) 20 MG tablet Take 20 mg by mouth daily.     Yes Historical Provider, MD  metoprolol (TOPROL-XL) 200 MG 24 hr tablet Take 200 mg by mouth daily.     Yes Historical Provider, MD  ondansetron (ZOFRAN) 4 MG tablet Take 4 mg by mouth every 8 (eight) hours as needed for nausea or vomiting.   Yes Historical Provider, MD  simvastatin (ZOCOR) 40 MG tablet Take 40 mg by mouth every evening.   Yes Historical Provider, MD  aspirin 81 MG tablet Take 1 tablet (81 mg total) by mouth daily. Patient not taking: Reported on 09/15/2014 06/23/14   Malissa HippoNajeeb U Rehman, MD  pantoprazole (PROTONIX) 40 MG tablet Take 1 po BID x 2 weeks then once a day 09/15/14   Ward GivensIva L Adam Sanjuan, MD  sucralfate (CARAFATE) 1 G tablet Take 1 tablet (1 g total) by mouth 4 (four) times daily -  with meals and at bedtime. 09/15/14   Ward GivensIva L Amyia Lodwick, MD    BP 112/74 mmHg  Pulse 79  Temp(Src) 98.5 F (36.9 C) (Oral)  Resp 18  Ht 5\' 5"  (1.651 m)  Wt 198 lb (89.812 kg)  BMI 32.95 kg/m2  SpO2 94%  Vital signs normal     Physical Exam  Constitutional: She is  oriented to person, place, and time. She appears well-developed and well-nourished.  Non-toxic appearance. She does not appear ill. No distress.  HENT:  Head: Normocephalic and atraumatic.  Right Ear: External ear normal.  Left Ear: External ear normal.  Nose: Nose normal. No mucosal edema or rhinorrhea.  Mouth/Throat: Oropharynx is clear and moist and mucous membranes are normal. No dental abscesses or uvula swelling.  Eyes: Conjunctivae and EOM are normal. Pupils are equal, round, and reactive to light.  Neck: Normal  range of motion and full passive range of motion without pain. Neck supple.  Cardiovascular: Normal rate, regular rhythm and normal heart sounds.  Exam reveals no gallop and no friction rub.   No murmur heard. Pulmonary/Chest: Effort normal and breath sounds normal. No respiratory distress. She has no wheezes. She has no rhonchi. She has no rales. She exhibits no tenderness and no crepitus.  Abdominal: Soft. Normal appearance and bowel sounds are normal. She exhibits no distension. There is no tenderness. There is no rebound and no guarding.  Musculoskeletal: Normal range of motion. She exhibits no edema or tenderness.  Moves all extremities well.   Neurological: She is alert and oriented to person, place, and time. She has normal strength. No cranial nerve deficit.  Skin: Skin is warm, dry and intact. No rash noted. No erythema. No pallor.  Psychiatric: Her speech is normal and behavior is normal. Her mood appears anxious.  Nursing note and vitals reviewed.   ED Course  Procedures (including critical care time)  Medications  gi cocktail (Maalox,Lidocaine,Donnatal) (30 mLs Oral Given 09/15/14 1858)    Pt's pain much improved after the GI cocktail. We discussed she now is having GERD and possibly esophagitis from the GERD, will increase her protonix to twice a day and start on carafate. She can follow up with Dr Karilyn Cota if not improving.   Labs Review Results for orders  placed or performed during the hospital encounter of 09/15/14  CBC with Differential  Result Value Ref Range   WBC 7.5 4.0 - 10.5 K/uL   RBC 3.99 3.87 - 5.11 MIL/uL   Hemoglobin 11.7 (L) 12.0 - 15.0 g/dL   HCT 16.1 (L) 09.6 - 04.5 %   MCV 87.5 78.0 - 100.0 fL   MCH 29.3 26.0 - 34.0 pg   MCHC 33.5 30.0 - 36.0 g/dL   RDW 40.9 81.1 - 91.4 %   Platelets 263 150 - 400 K/uL   Neutrophils Relative % 66 43 - 77 %   Neutro Abs 4.9 1.7 - 7.7 K/uL   Lymphocytes Relative 28 12 - 46 %   Lymphs Abs 2.1 0.7 - 4.0 K/uL   Monocytes Relative 5 3 - 12 %   Monocytes Absolute 0.4 0.1 - 1.0 K/uL   Eosinophils Relative 1 0 - 5 %   Eosinophils Absolute 0.1 0.0 - 0.7 K/uL   Basophils Relative 0 0 - 1 %   Basophils Absolute 0.0 0.0 - 0.1 K/uL  Troponin I  Result Value Ref Range   Troponin I <0.30 <0.30 ng/mL  Basic metabolic panel  Result Value Ref Range   Sodium 138 137 - 147 mEq/L   Potassium 4.6 3.7 - 5.3 mEq/L   Chloride 97 96 - 112 mEq/L   CO2 27 19 - 32 mEq/L   Glucose, Bld 155 (H) 70 - 99 mg/dL   BUN 15 6 - 23 mg/dL   Creatinine, Ser 7.82 0.50 - 1.10 mg/dL   Calcium 9.7 8.4 - 95.6 mg/dL   GFR calc non Af Amer 76 (L) >90 mL/min   GFR calc Af Amer 88 (L) >90 mL/min   Anion gap 14 5 - 15  Pro b natriuretic peptide (BNP)  Result Value Ref Range   Pro B Natriuretic peptide (BNP) 52.9 0 - 125 pg/mL   Laboratory interpretation all normal except hyperglycemia   Imaging Review Dg Chest 2 View  09/15/2014   CLINICAL DATA:  Mid chest pain for 1 day, cough, initial encounter  EXAM:  CHEST  2 VIEW  COMPARISON:  06/29/2012  FINDINGS: The heart size and mediastinal contours are within normal limits. Both lungs are clear. The visualized skeletal structures show degenerative change of thoracic spine.  IMPRESSION: No active cardiopulmonary disease.   Electronically Signed   By: Alcide CleverMark  Lukens M.D.   On: 09/15/2014 19:31     EKG Interpretation   Date/Time:  Tuesday September 15 2014 18:01:36  EST Ventricular Rate:  86 PR Interval:  188 QRS Duration: 86 QT Interval:  389 QTC Calculation: 465 R Axis:   26 Text Interpretation:  Sinus rhythm Anteroseptal infarct, old Baseline  wander in lead(s) V3 No significant change since last tracing 16 May 2010  Confirmed by [facility]  MD-I, Acelin Ferdig ([id number]) on 09/15/2014 6:42:11 PM      MDM   Final diagnoses:  Chest pain  Esophagitis    New Prescriptions   SUCRALFATE (CARAFATE) 1 G TABLET    Take 1 tablet (1 g total) by mouth 4 (four) times daily -  with meals and at bedtime.   Modified Medications   Modified Medication Previous Medication   PANTOPRAZOLE (PROTONIX) 40 MG TABLET pantoprazole (PROTONIX) 40 MG tablet      Take 1 po BID x 2 weeks then once a day    Take 1 tablet (40 mg total) by mouth daily.    Plan discharge  Devoria AlbeIva Prarthana Parlin, MD, Franz DellFACEP   Costa Jha L Frady Taddeo, MD 09/15/14 2137

## 2014-09-15 NOTE — Discharge Instructions (Signed)
Avoid fried, spicy or greasy foods. Increase your protonix to twice a day for the next 2 weeks then once a day. Take the carafate (place in a glass of water) as prescribed. You can take TUMS, maalox or mylanta if needed for burning, or discomfort. Contact Dr Patty Sermonsehman's office to see if he needs to see you this week.

## 2014-09-22 ENCOUNTER — Encounter (INDEPENDENT_AMBULATORY_CARE_PROVIDER_SITE_OTHER): Payer: Self-pay | Admitting: Internal Medicine

## 2014-09-22 ENCOUNTER — Ambulatory Visit (HOSPITAL_COMMUNITY)
Admission: RE | Admit: 2014-09-22 | Discharge: 2014-09-22 | Disposition: A | Discharge status: Home / Self Care | Payer: PRIVATE HEALTH INSURANCE | Source: Ambulatory Visit | Attending: Internal Medicine | Admitting: Internal Medicine

## 2014-09-22 ENCOUNTER — Ambulatory Visit (INDEPENDENT_AMBULATORY_CARE_PROVIDER_SITE_OTHER): Payer: PRIVATE HEALTH INSURANCE | Admitting: Internal Medicine

## 2014-09-22 VITALS — BP 120/70 | HR 76 | Temp 98.4°F | Resp 18 | Ht 66.0 in | Wt 210.1 lb

## 2014-09-22 DIAGNOSIS — R1310 Dysphagia, unspecified: Secondary | ICD-10-CM

## 2014-09-22 DIAGNOSIS — K2100 Gastro-esophageal reflux disease with esophagitis, without bleeding: Secondary | ICD-10-CM

## 2014-09-22 DIAGNOSIS — G8929 Other chronic pain: Secondary | ICD-10-CM

## 2014-09-22 DIAGNOSIS — R109 Unspecified abdominal pain: Secondary | ICD-10-CM

## 2014-09-22 DIAGNOSIS — R112 Nausea with vomiting, unspecified: Secondary | ICD-10-CM

## 2014-09-22 DIAGNOSIS — K222 Esophageal obstruction: Secondary | ICD-10-CM | POA: Insufficient documentation

## 2014-09-22 MED ORDER — PROMETHAZINE HCL 25 MG PO TABS: 25.0000 mg | ORAL_TABLET | Freq: Two times a day (BID) | ORAL | Status: DC | PRN

## 2014-09-22 MED ORDER — GI COCKTAIL ~~LOC~~: 15.0000 mL | Freq: Two times a day (BID) | ORAL | Status: DC | PRN

## 2014-09-22 MED ORDER — PANTOPRAZOLE SODIUM 40 MG PO TBEC: 40.0000 mg | DELAYED_RELEASE_TABLET | Freq: Two times a day (BID) | ORAL | Status: DC

## 2014-09-22 NOTE — Progress Notes (Signed)
Presenting complaint;  Nausea vomiting dysphagia and abdominal pain.  Database;  Patient is 53 year old Caucasian female who was initially evaluated on 06/23/2014 for nausea anorexia dysphagia abdominal pain and weight loss. CBC and LFTs were normal. Embolic 7 was also normal except serum sodium 131 BUN of 24 and creatinine of 1.38. Patient was advised to drop aspirin dose from 01/17/2024 to 81 mg daily. She was begun on pantoprazole. She underwent EGD with ED and colonoscopy on 07/17/2014. She had mild changes of Candida esophagitis and reflux esophagitis limited to GE junction. 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 Esophagus was dilated by passing 56 French Maloney dilator but no mucosal disruption induced. On anoscopy was normal except small anal papilla. Candida esophagitis was treated with Mycostatin. She was begun on dicyclomine 10 mg before each meal and sucralfate 2 g at bedtime.  Subjective:  Patient does not feel well. She has multiple complaints. Last week she was seen in emergency room on 09/15/2014 for chest pain. Troponin level and EKG were normal. Chest pain responded to GI cocktail. She is having difficulty swallowing liquids and solids. She points to suprasternal area as site of bolus obstruction. She states she had no difficulty swallowing for 3 weeks after dilation on 07/17/2014. She also complains of daily nausea and intermittent vomiting. At times she vomits when she wakes up in the morning. She states she vomits what looks like applesauce. She denies hematemesis melena or rectal bleeding. She does not have good appetite. She has not lost any weight since her last visit. She continues to complain of epigastric and then he umbilical pain. Periumbilical pain is constant and epigastric pain usually occurs following a meal. She takes alprazolam 3 times a day. She takes Tylenol with Codeine anywhere from 0-4 doses per day for migraine and/or ankle pain. She does not take  NSAIDs other than low-dose aspirin. She is having intermittent chest pain and her heartburn. She says Zofran is not helping with nausea and vomiting and she did not get any relief with dicyclomine or Carafate. She remains under a lot of stress on account of her daughter's illness. Her daughter had massive CVA at 3434. She is in a nursing home and has been advised to leave the facility and Misty StanleyLisa does not know what to do.   Current Medications: Outpatient Encounter Prescriptions as of 09/22/2014  Medication Sig  . acetaminophen-codeine (TYLENOL #3) 300-30 MG per tablet Take 1 tablet by mouth every 4 (four) hours as needed. For pain  . ALPRAZolam (XANAX) 0.5 MG tablet Take 1 tablet by mouth 3 (three) times daily as needed. For anxiety  . aspirin EC 81 MG tablet Take 81 mg by mouth every morning.  . canagliflozin (INVOKANA) 100 MG TABS tablet Take 100 mg by mouth daily.  Marland Kitchen. dicyclomine (BENTYL) 10 MG capsule Take 10 mg by mouth 4 (four) times daily -  before meals and at bedtime.  . Liraglutide (VICTOZA [place]) Inject 1.8 mg into the skin daily.  Marland Kitchen. lisinopril (PRINIVIL,ZESTRIL) 20 MG tablet Take 20 mg by mouth daily.    . metoprolol (TOPROL-XL) 200 MG 24 hr tablet Take 200 mg by mouth daily.    . ondansetron (ZOFRAN) 4 MG tablet Take 4 mg by mouth every 8 (eight) hours as needed for nausea or vomiting.  . pantoprazole (PROTONIX) 40 MG tablet Take 1 po BID x 2 weeks then once a day  . simvastatin (ZOCOR) 40 MG tablet Take 40 mg by mouth every evening.  . sucralfate (  CARAFATE) 1 G tablet Take 1 tablet (1 g total) by mouth 4 (four) times daily -  with meals and at bedtime. (Patient not taking: Reported on 09/22/2014)  . [DISCONTINUED] aspirin 81 MG tablet Take 1 tablet (81 mg total) by mouth daily. (Patient not taking: Reported on 09/15/2014)  . [DISCONTINUED] azithromycin (ZITHROMAX) 250 MG tablet Take 250-500 mg by mouth See admin instructions. Take two tablets on day 1, then take one daily for 5 days.      Objective: Blood pressure 120/70, pulse 76, temperature 98.4 F (36.9 C), temperature source Oral, resp. rate 18, height 5\' 6"  (1.676 m), weight 210 lb 1.6 oz (95.301 kg). Patient is alert and in no acute distress. Conjunctiva is pink. Sclera is nonicteric Oropharyngeal mucosa is normal. No neck masses or thyromegaly noted. Cardiac exam with regular rhythm normal S1 and S2. No murmur or gallop noted. Lungs are clear to auscultation. Abdomen is full. Bowel sounds are normal. On palpation abdomen is soft with mild peri-umbilical tenderness as well as midepigastric tenderness. No organomegaly or masses.  No LE edema or clubbing noted.  Labs/studies Results: CBC from 09/15/2014  WBC 7.5, H&H 11.7 and 34.9, MCV 87.5 and platelet count 263K    Assessment:  #1. Dysphagia to solids as well as liquids. She had mild Candida esophagitis as well as changes of reflux esophagitis on EGD of 8 weeks ago but no stricture or ring was found. She may have esophageal motility disorder. She will be further evaluated with barium pill esophagogram. #2. Nausea and vomiting. Recent EGD negative for peptic ulcer disease. She is had cholecystectomy. Suspect nausea and vomiting may be due to medication or stress but will need to rule out gastroparesis. #3. Chronic abdominal pain most of which appears to be wall pain. #4. Atypical chest pain most likely secondary to GERD and/or esophageal spasm responding to GI cocktail.   Plan:  Discontinue dicyclomine sucralfate and ondansetron. Increase pantoprazole to 40 mg by mouth twice a day. Promethazine 25 mg by mouth twice a day when necessary. GI cocktail 1 tablespoonful by mouth twice a day when necessary. Barium pill esophagogram. Office visit in 3 months.

## 2014-09-22 NOTE — Patient Instructions (Addendum)
Physician will call with results of barium study 

## 2014-09-23 ENCOUNTER — Ambulatory Visit (HOSPITAL_COMMUNITY)
Admission: RE | Admit: 2014-09-23 | Discharge: 2014-09-23 | Disposition: A | Discharge status: Home / Self Care | Payer: PRIVATE HEALTH INSURANCE | Source: Ambulatory Visit | Attending: Internal Medicine | Admitting: Internal Medicine

## 2014-09-23 ENCOUNTER — Telehealth (INDEPENDENT_AMBULATORY_CARE_PROVIDER_SITE_OTHER): Payer: Self-pay | Admitting: *Deleted

## 2014-09-23 ENCOUNTER — Encounter (HOSPITAL_COMMUNITY)
Admission: RE | Disposition: A | Discharge status: Home / Self Care | Payer: Self-pay | Source: Ambulatory Visit | Attending: Internal Medicine

## 2014-09-23 ENCOUNTER — Other Ambulatory Visit (INDEPENDENT_AMBULATORY_CARE_PROVIDER_SITE_OTHER): Payer: Self-pay | Admitting: *Deleted

## 2014-09-23 ENCOUNTER — Encounter (HOSPITAL_COMMUNITY): Payer: Self-pay | Admitting: *Deleted

## 2014-09-23 DIAGNOSIS — Z8249 Family history of ischemic heart disease and other diseases of the circulatory system: Secondary | ICD-10-CM | POA: Diagnosis not present

## 2014-09-23 DIAGNOSIS — K219 Gastro-esophageal reflux disease without esophagitis: Secondary | ICD-10-CM | POA: Insufficient documentation

## 2014-09-23 DIAGNOSIS — E785 Hyperlipidemia, unspecified: Secondary | ICD-10-CM | POA: Diagnosis not present

## 2014-09-23 DIAGNOSIS — K449 Diaphragmatic hernia without obstruction or gangrene: Secondary | ICD-10-CM | POA: Insufficient documentation

## 2014-09-23 DIAGNOSIS — E669 Obesity, unspecified: Secondary | ICD-10-CM | POA: Insufficient documentation

## 2014-09-23 DIAGNOSIS — Z7982 Long term (current) use of aspirin: Secondary | ICD-10-CM | POA: Diagnosis not present

## 2014-09-23 DIAGNOSIS — I10 Essential (primary) hypertension: Secondary | ICD-10-CM | POA: Insufficient documentation

## 2014-09-23 DIAGNOSIS — Z6833 Body mass index (BMI) 33.0-33.9, adult: Secondary | ICD-10-CM | POA: Insufficient documentation

## 2014-09-23 DIAGNOSIS — R1310 Dysphagia, unspecified: Secondary | ICD-10-CM

## 2014-09-23 DIAGNOSIS — G43909 Migraine, unspecified, not intractable, without status migrainosus: Secondary | ICD-10-CM | POA: Insufficient documentation

## 2014-09-23 DIAGNOSIS — R933 Abnormal findings on diagnostic imaging of other parts of digestive tract: Secondary | ICD-10-CM

## 2014-09-23 DIAGNOSIS — Z79899 Other long term (current) drug therapy: Secondary | ICD-10-CM | POA: Diagnosis not present

## 2014-09-23 DIAGNOSIS — K314 Gastric diverticulum: Secondary | ICD-10-CM | POA: Insufficient documentation

## 2014-09-23 DIAGNOSIS — K225 Diverticulum of esophagus, acquired: Secondary | ICD-10-CM

## 2014-09-23 DIAGNOSIS — E119 Type 2 diabetes mellitus without complications: Secondary | ICD-10-CM | POA: Diagnosis not present

## 2014-09-23 SURGERY — EGD (ESOPHAGOGASTRODUODENOSCOPY)
Anesthesia: Moderate Sedation

## 2014-09-23 MED ORDER — MIDAZOLAM HCL 5 MG/5ML IJ SOLN
INTRAMUSCULAR | Status: DC | PRN
  Administered 2014-09-23: 2 mg via INTRAVENOUS
  Administered 2014-09-23 (×2): 3 mg via INTRAVENOUS
  Administered 2014-09-23: 2 mg via INTRAVENOUS

## 2014-09-23 MED ORDER — MEPERIDINE HCL 50 MG/ML IJ SOLN
INTRAMUSCULAR | Status: AC
  Filled 2014-09-23: qty 1

## 2014-09-23 MED ORDER — MEPERIDINE HCL 50 MG/ML IJ SOLN
INTRAMUSCULAR | Status: DC | PRN
  Administered 2014-09-23 (×3): 25 mg via INTRAVENOUS

## 2014-09-23 MED ORDER — PROMETHAZINE HCL 25 MG/ML IJ SOLN
INTRAMUSCULAR | Status: AC
  Filled 2014-09-23: qty 1

## 2014-09-23 MED ORDER — MEPERIDINE HCL 50 MG/ML IJ SOLN
INTRAMUSCULAR | Status: DC
Start: 2014-09-23 — End: 2014-09-23
  Filled 2014-09-23: qty 1

## 2014-09-23 MED ORDER — SODIUM CHLORIDE 0.9 % IJ SOLN
INTRAMUSCULAR | Status: AC
  Filled 2014-09-23: qty 10

## 2014-09-23 MED ORDER — SODIUM CHLORIDE 0.9 % IV SOLN
INTRAVENOUS | Status: DC
  Administered 2014-09-23: 13:00:00 via INTRAVENOUS

## 2014-09-23 MED ORDER — MIDAZOLAM HCL 5 MG/5ML IJ SOLN
INTRAMUSCULAR | Status: AC
  Filled 2014-09-23: qty 10

## 2014-09-23 MED ORDER — STERILE WATER FOR IRRIGATION IR SOLN
Status: DC | PRN
  Administered 2014-09-23: 13:00:00

## 2014-09-23 MED ORDER — BUTAMBEN-TETRACAINE-BENZOCAINE 2-2-14 % EX AERO
INHALATION_SPRAY | CUTANEOUS | Status: DC | PRN
  Administered 2014-09-23: 2 via TOPICAL

## 2014-09-23 MED ORDER — PROMETHAZINE HCL 25 MG/ML IJ SOLN
INTRAMUSCULAR | Status: DC | PRN
Start: 2014-09-23 — End: 2014-09-23
  Administered 2014-09-23: 25 mg via INTRAVENOUS

## 2014-09-23 NOTE — H&P (Signed)
Stacy SallesLisa Frost is an 53 y.o. female.   Chief Complaint:  Patient is here for EGD and ED. HPI: Patient is 53 year old Caucasian female with history of GERD who was seen in the office yesterday for dysphagia solids and liquids along with other symptoms. She underwent EGD with dilation on 07/17/2014. She was noted to have mild tenderness esophagitis and changes of reflux of hepatitis limited to GE junction. There are esophagus was dilated by passing 56 [place] Maloney dilator but no mucosal disruption was used. She had no difficulty swallowing for 3 weeks. Patient therefore was sent over to the hospital for barium pill study which revealed distal narrowing not allowing barium pill passed through. She is therefore returning for EGD with ED.  Past Medical History  Diagnosis Date  . Restless leg syndrome   . Skin tag   . Hyperlipidemia   . Obesity   . Pain in joint, ankle and foot     Chronic  . Bicuspid aortic valve     Very mild stenosis 12/12  . Migraine headache   . Essential hypertension, benign   . Type 2 diabetes mellitus   . Anxiety   . History of cardiac catheterization     Normal coronaries 2008  . Palpitations     Documented PACs by previous monitoring  . Heart murmur     Past Surgical History  Procedure Laterality Date  . Cholecystectomy  2001    "Poor EF at 17%"  . Umbilical hernia repair  2003  . Achilles tendon repair  2003    Left  . Cardiac catheterization  2008    Normal coronary arteries; normal LV systolic function; mild dilation of aortic root; mild dilation of proximal ascending aorta; likely bicuspid aortic valve  . Colonoscopy N/A 07/17/2014    Procedure: COLONOSCOPY;  Surgeon: Malissa HippoNajeeb U Rehman, MD;  Location: [facility];  Service: Endoscopy;  Laterality: N/A;  105  . Esophagogastroduodenoscopy N/A 07/17/2014    Procedure: ESOPHAGOGASTRODUODENOSCOPY (EGD);  Surgeon: Malissa HippoNajeeb U Rehman, MD;  [facility]: [facility];  Service: Endoscopy;  Laterality: N/A;  [name]. Maloney  dilation N/A 07/17/2014    Procedure: Elease HashimotoMALONEY DILATION;  Surgeon: Malissa HippoNajeeb U Rehman, MD;  [facility]: [facility];  Service: Endoscopy;  Laterality: N/A;  . Knee surgery      Family History  Problem Relation Age of Onset  . Depression Mother   . Cancer Mother     Lung mets (unsure as to what kind)  . Hypertension Father   . Depression Father   . Heart attack Father   . Depression Daughter   . Anxiety disorder Daughter   . Stroke Daughter    Social History:  reports that she has never smoked. She has never used smokeless tobacco. She reports that she does not drink alcohol or use illicit drugs.  Allergies:  Allergies  Allergen Reactions  . Amlodipine Besylate Shortness Of Breath    REACTION: SOB  . Morphine And Related Shortness Of Breath    Chest pain  . Amitriptyline     Unknown reaction  . Butorphanol Tartrate Other (See Comments)    REACTION: ED visit and got for migraine - not sure of response  . Compazine [Prochlorperazine Edisylate]     Altered mental status  . Sumatriptan     REACTION: HTN    Medications Prior to Admission  Medication Sig Dispense Refill  . acetaminophen-codeine (TYLENOL #3) 300-30 MG per tablet Take 1 tablet by mouth every 4 (four) hours as  needed. For pain  0  . ALPRAZolam (XANAX) 0.5 MG tablet Take 1 tablet by mouth 3 (three) times daily as needed. For anxiety  0  . aspirin EC 81 MG tablet Take 81 mg by mouth every morning.    . canagliflozin (INVOKANA) 100 MG TABS tablet Take 100 mg by mouth daily.    . Liraglutide (VICTOZA [place]) Inject 1.8 mg into the skin daily.    Marland Kitchen. lisinopril (PRINIVIL,ZESTRIL) 20 MG tablet Take 20 mg by mouth daily.      . metoprolol (TOPROL-XL) 200 MG 24 hr tablet Take 200 mg by mouth daily.      . pantoprazole (PROTONIX) 40 MG tablet Take 1 tablet (40 mg total) by mouth 2 (two) times daily before a meal. 180 tablet 1  . Alum & Mag Hydroxide-Simeth (GI COCKTAIL) SUSP suspension Take 15 mLs by mouth 2 (two) times daily as  needed for indigestion. Shake well. 240 mL 1  . promethazine (PHENERGAN) 25 MG tablet Take 1 tablet (25 mg total) by mouth 2 (two) times daily as needed for nausea or vomiting. 20 tablet 1    No results found for this or any previous visit (from the past 48 hour(s)). Dg Esophagus  09/22/2014   CLINICAL DATA:  Dysphagia.  EXAM: ESOPHOGRAM / BARIUM SWALLOW / BARIUM TABLET STUDY  TECHNIQUE: Combined double contrast and single contrast examination performed using effervescent crystals, thick barium liquid, and thin barium liquid. The patient was observed with fluoroscopy swallowing a 13mm barium sulphate tablet.  FLUOROSCOPY TIME:  2 min 54 second  COMPARISON:  None.  FINDINGS: Fluoroscopic evaluation of swallowing demonstrates normal esophageal peristalsis. No fold thickening or mass. Initially, no stricture narrowing was appreciated. However, after administering at 13 mm barium tablet, this sticks in the distal esophagus. Barium was again administered, demonstrating mild smooth narrowing of the distal esophagus. The barium tablet sticks in the distal esophagus for several min and reproduces the patient's symptoms.  IMPRESSION: Smooth narrowing of the distal esophagus. The 13 mm barium tablet sticks in this area and reproduces the patient's symptoms.   Electronically Signed   By: Charlett NoseKevin  Dover M.D.   On: 09/22/2014 17:07    ROS  Blood pressure 132/67, pulse 65, temperature 97.8 F (36.6 C), temperature source Oral, resp. rate 18, height 5\' 6"  (1.676 m), weight 208 lb (94.348 kg), SpO2 100 %. Physical Exam  Constitutional: She appears well-developed and well-nourished.  HENT:  Mouth/Throat: Oropharynx is clear and moist.  Eyes: Conjunctivae are normal. No scleral icterus.  Neck: No thyromegaly present.  GI: Soft. She exhibits no mass. There is no rebound (mild tenderness at epigastrium and perimbilical region) and no guarding.  Musculoskeletal: She exhibits no edema.  Lymphadenopathy:    She has no  cervical adenopathy.  Neurological: She is alert.  Skin: Skin is warm and dry.     Assessment/Plan Dysphagia to solids and liquids. History of GERD and Candida esophagitis. Abnormal esophagogram revealing distal narrowing venting barium pill from passing distally. EGD with ED.  REHMAN,NAJEEB U 09/23/2014, 1:15 PM

## 2014-09-23 NOTE — Op Note (Signed)
EGD PROCEDURE REPORT  PATIENT:  Stacy SallesLisa Frost  MR#:  [id number] Birthdate:  [birth year], 53 y.o., female Endoscopist:  Dr. Malissa HippoNajeeb U. Sabryn Preslar, MD Referred By:  Aggie Hackerr.Angus G. Renard MatterMcInnis ,MD Procedure Date: 09/23/2014  Procedure:   EGD with ED(balloon).  Indications:  Patient is 53 year old Caucasian female who was chronic GERD and presents with recurrent dysphagia to solids and liquids. She underwent EGD on 07/17/2014 and noted to have mild Candida esophagitis as well as reflux esophagitis but there was no stricture. She responded to esophageal dilation but benefit lasted 3 weeks only. She had barium pill esophagogram yesterday which revealed distal esophageal narrowing not allowing passage of barium pill. She is therefore returning for repeat EGD and dilation.            Informed Consent:  The risks, benefits, alternatives & imponderables which include, but are not limited to, bleeding, infection, perforation, drug reaction and potential missed lesion have been reviewed.  The potential for biopsy, lesion removal, esophageal dilation, etc. have also been discussed.  Questions have been answered.  All parties agreeable.  Please see history & physical in medical record for more information.  Medications:  Demerol 75 mg IV Versed 10 mg IV Promethazine 25 mg IV in diluted form. Cetacaine spray topically for oropharyngeal anesthesia  Description of procedure:  The endoscope was introduced through the mouth and advanced to the second portion of the duodenum without difficulty or limitations. The mucosal surfaces were surveyed very carefully during advancement of the scope and upon withdrawal.  Findings:  Esophagus:  Mucosa of the esophagus was normal. No stricture or ring identified distally. Some resistance noted as the scope was passed through the distal segment. GE junction was unremarkable. GEJ:  36 cm Hiatus:  38 cm Stomach:  Stomach was empty and distended very well with insufflation. Folds in the  proximal stomach were normal. Examination of mucosa at gastric body was normal. Small antral diverticulum noted along the posterior wall. Antral mucosa was otherwise normal. Pyloric channel was patent. Angularis fundus and cardia were normal. Duodenum:  Normal bulbar and post bulbar mucosa.  Therapeutic/Diagnostic Maneuvers Performed:  Distal esophagus was dilated with balloon dilator. Balloon dilator was advanced through the scope and guidewire push in the gastric lumen. Scope was withdrawn into body of esophagus and balloon dilate was positioned across distal esophagus and GE junction. It was insufflated to a diameter of 18 mm and then to 19 mm. No mucosal disruption induced. Balloon dilator was insufflated to dilator of 20 mm and maintained for 1 minute and deflated. Once again no mucosal disruption noted. Balloon was withdrawn. Biopsy was taken from mucosa of distal and midesophagus for routine histology.  Complications:  None  Impression: No evidence of erosive or Candida esophagitis. No evidence of distal esophageal stricture or ring.. Small sliding hiatal hernia. Small diverticulum at gastric antrum. Distal esophagus dilated with balloon from 18-20 mm but no mucosal disruption noted. Biopsy taken from mucosa of mid and distal esophagus for routine histology.   Recommendations:  Standard instructions given. I will be contacting patient with biopsy results. If she remains with dysphagia will consider esophageal manometry and impedance study but may try nitrates first.  Deairra Halleck U  09/23/2014  1:47 PM  CC: Dr. Alice ReichertMCINNIS,ANGUS G, MD & Dr. Bonnetta BarryNo ref. provider found

## 2014-09-23 NOTE — Telephone Encounter (Signed)
Stacy Frost had her Esophagram Pill Study that showed a narrowing   Pill did stick  Can do today however Dr. Karilyn Cotaehman had mentioned Propofol since she is hard to sedate.  Please call her this morning  She is hoping to do today.

## 2014-09-23 NOTE — Discharge Instructions (Signed)
Resume usual medications and diet. °No driving for 24 hours. °Physician will call with biopsy results. ° ° ° ° °Esophagogastroduodenoscopy °Care After °Refer to this sheet in the next few weeks. These instructions provide you with information on caring for yourself after your procedure. Your caregiver may also give you more specific instructions. Your treatment has been planned according to current medical practices, but problems sometimes occur. Call your caregiver if you have any problems or questions after your procedure.  °HOME CARE INSTRUCTIONS °· Do not eat or drink anything until the numbing medicine (local anesthetic) has worn off and your gag reflex has returned. You will know that the local anesthetic has worn off when you can swallow comfortably. °· Do not drive for 12 hours after the procedure or as directed by your caregiver. °· Only take medicines as directed by your caregiver. °SEEK MEDICAL CARE IF:  °· You cannot stop coughing. °· You are not urinating at all or less than usual. °SEEK IMMEDIATE MEDICAL CARE IF: °· You have difficulty swallowing. °· You cannot eat or drink. °· You have worsening throat or chest pain. °· You have dizziness, lightheadedness, or you faint. °· You have nausea or vomiting. °· You have chills. °· You have a fever. °· You have severe abdominal pain. °· You have black, tarry, or bloody stools. °Document Released: 10/02/2012 Document Reviewed: 10/02/2012 °[employer]® Patient Information ©2015 [employer]. This information is not intended to replace advice given to you by your health care provider. Make sure you discuss any questions you have with your health care provider. ° °

## 2014-09-28 LAB — GLUCOSE, CAPILLARY: Glucose-Capillary: 152 mg/dL — ABNORMAL HIGH (ref 70–99)

## 2014-10-09 ENCOUNTER — Encounter (INDEPENDENT_AMBULATORY_CARE_PROVIDER_SITE_OTHER): Payer: Self-pay | Admitting: *Deleted

## 2014-10-15 ENCOUNTER — Encounter (INDEPENDENT_AMBULATORY_CARE_PROVIDER_SITE_OTHER): Payer: Self-pay

## 2015-01-06 ENCOUNTER — Encounter (INDEPENDENT_AMBULATORY_CARE_PROVIDER_SITE_OTHER): Payer: Self-pay | Admitting: *Deleted

## 2015-01-19 ENCOUNTER — Ambulatory Visit (INDEPENDENT_AMBULATORY_CARE_PROVIDER_SITE_OTHER): Payer: PRIVATE HEALTH INSURANCE | Admitting: Internal Medicine

## 2015-01-25 ENCOUNTER — Ambulatory Visit (INDEPENDENT_AMBULATORY_CARE_PROVIDER_SITE_OTHER): Payer: PRIVATE HEALTH INSURANCE | Admitting: Internal Medicine

## 2015-03-31 ENCOUNTER — Encounter (INDEPENDENT_AMBULATORY_CARE_PROVIDER_SITE_OTHER): Payer: Self-pay | Admitting: *Deleted

## 2015-04-25 IMAGING — CT CT ABD-PELV W/ CM
2 of 5 series · 17 of 46 positions shown, 19 images · IV contrast (READICAT/WATER & [ID] OMNI 350)
Comparison: 02/05/2006.

CLINICAL DATA: Weight loss and nausea. History of ventral hernia
repair.

BUN and creatinine were obtained on site at [HOSPITAL] at
[HOSPITAL].
Results:  BUN 12.0 mg/dL,  Creatinine 0.7 mg/dL.
EXAM:
CT ABDOMEN AND PELVIS WITH CONTRAST
TECHNIQUE: Multidetector CT imaging of the abdomen and pelvis was performed
using the standard protocol following bolus administration of
intravenous contrast.
CONTRAST:  125mL OMNIPAQUE IOHEXOL 300 MG/ML  SOLN

[Series 2: abd/pelvis with · axial · 0.88mm/px · z∈[-469,-64]mm · 14 of 93 slices shown, 16 images]
[im 6/93  soft-tissue]
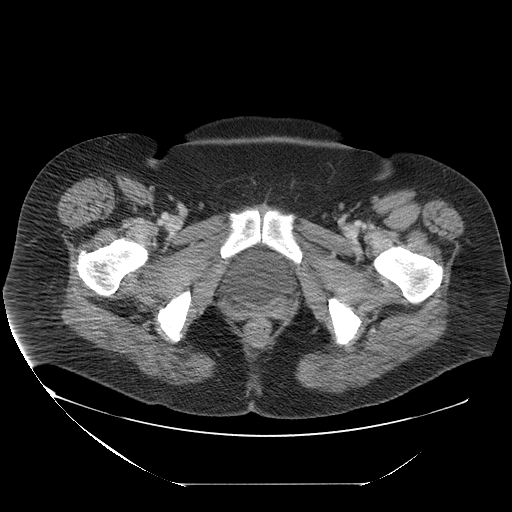
[im 6/93  bone]
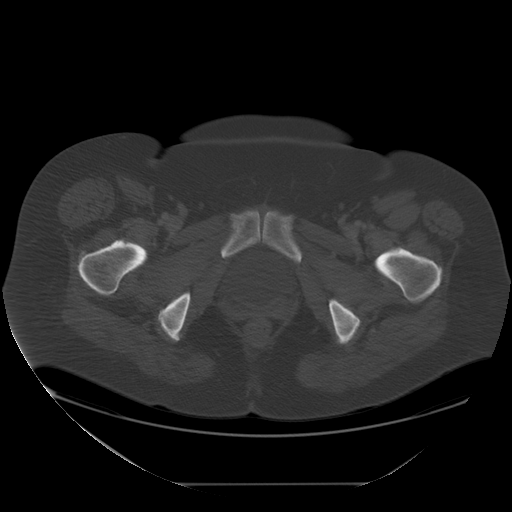
[im 11/93  soft-tissue]
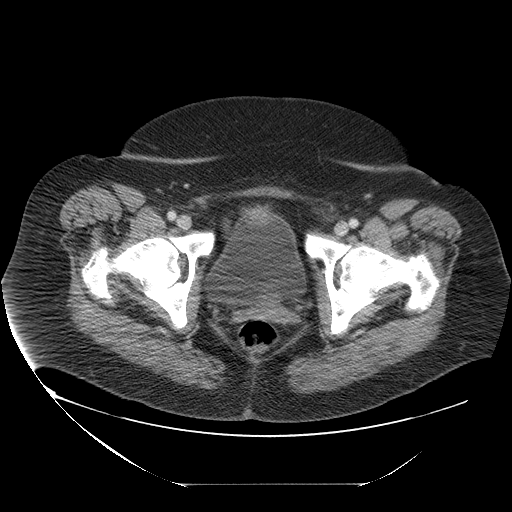
[im 21/93  soft-tissue]
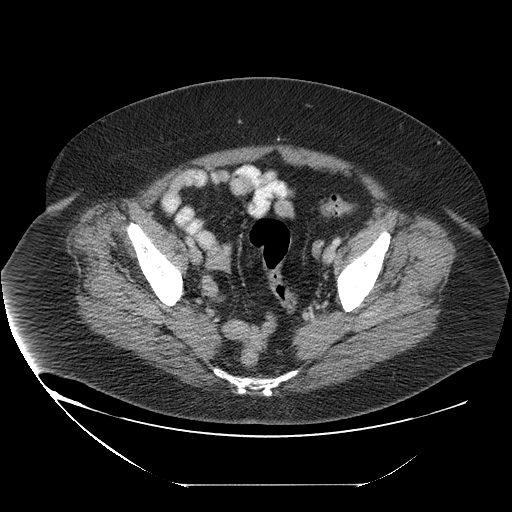
[im 26/93  soft-tissue]
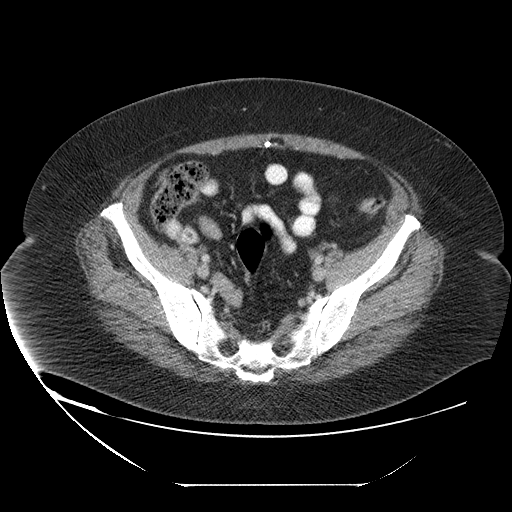
[im 31/93  soft-tissue]
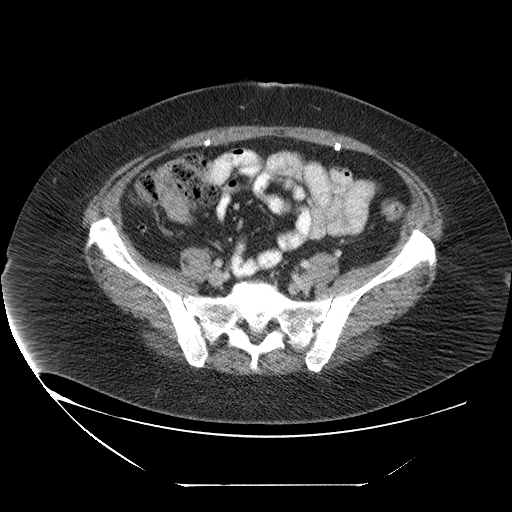
[im 36/93  soft-tissue]
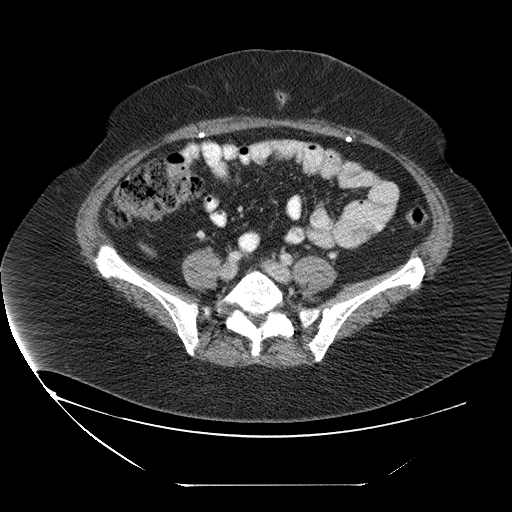
[im 41/93  soft-tissue]
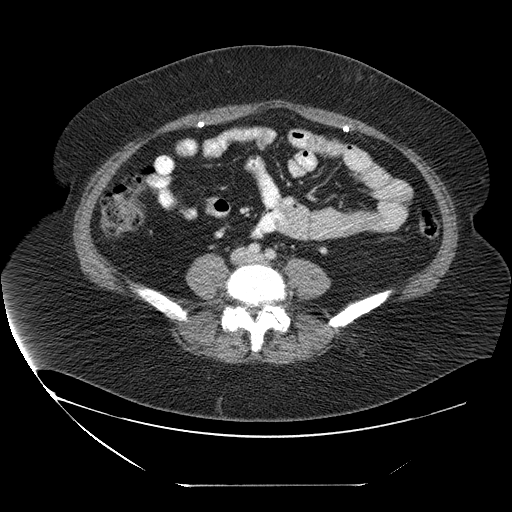
[im 52/93  soft-tissue]
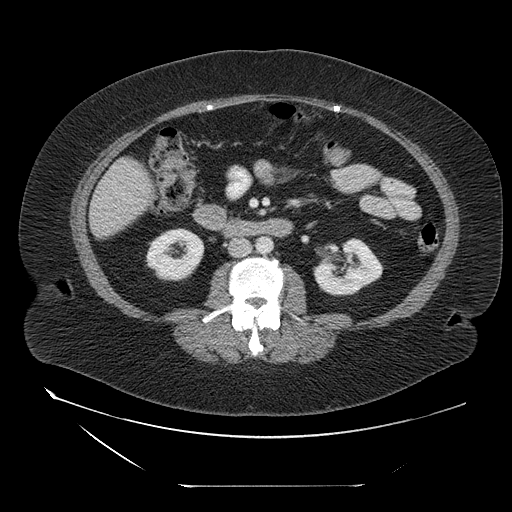
[im 57/93  soft-tissue]
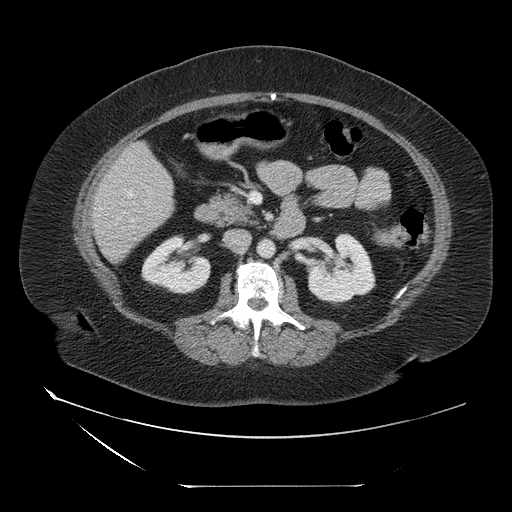
[im 57/93  bone]
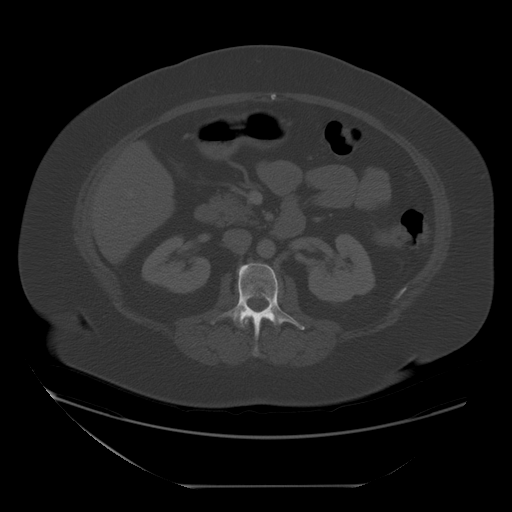
[im 62/93  soft-tissue]
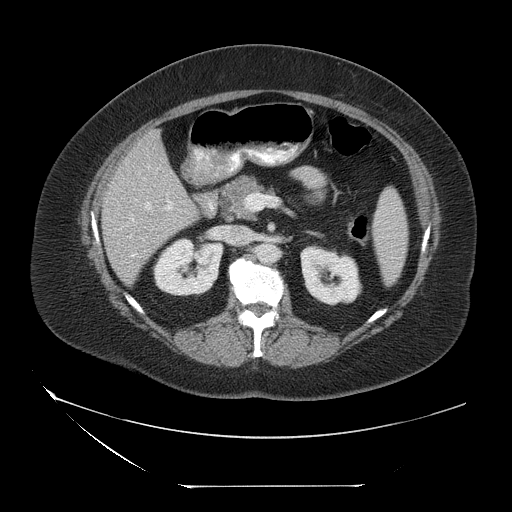
[im 67/93  soft-tissue]
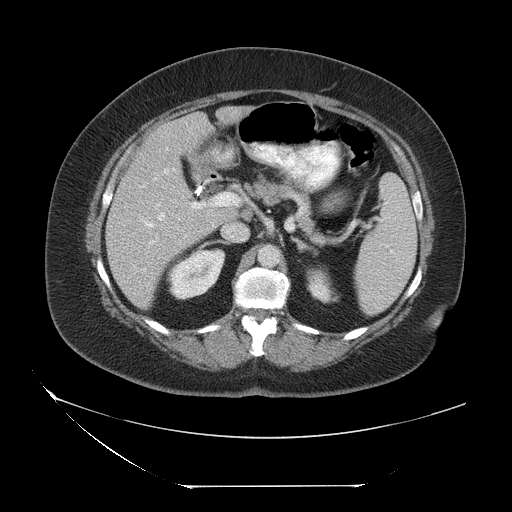
[im 72/93  soft-tissue]
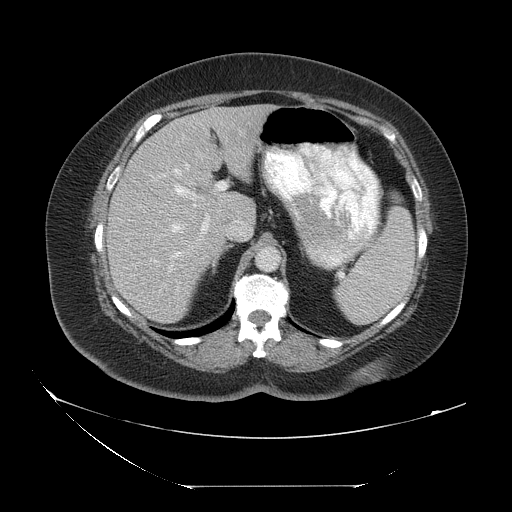
[im 82/93  soft-tissue]
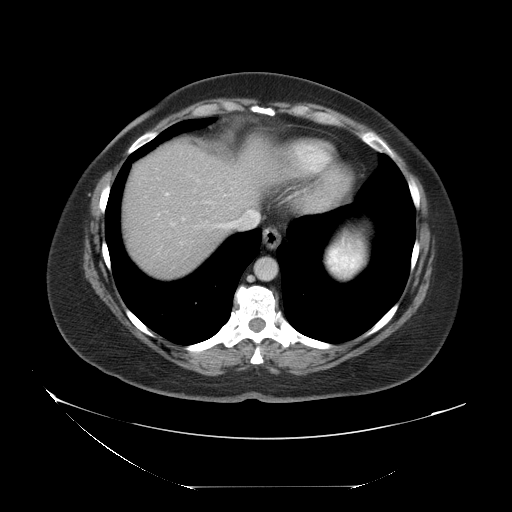
[im 87/93  soft-tissue]
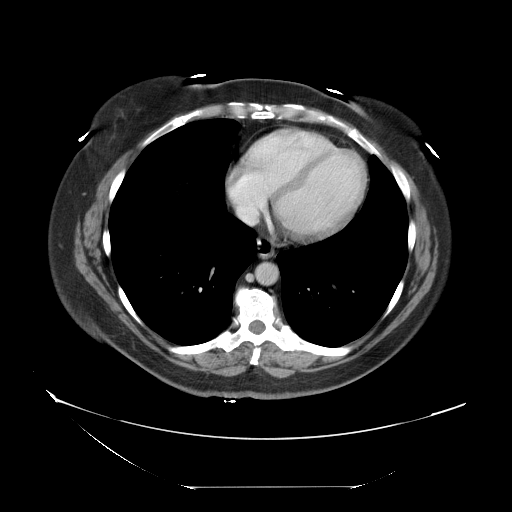

[Series 400: cor · coronal · 0.92mm/px · 3 of 179 slices shown]
[im 60/179  soft-tissue]
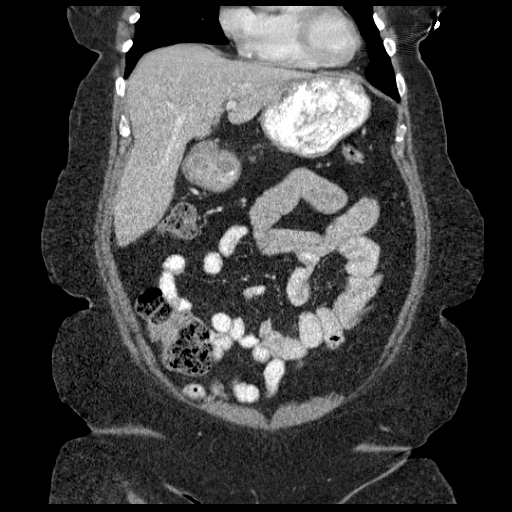
[im 80/179  soft-tissue]
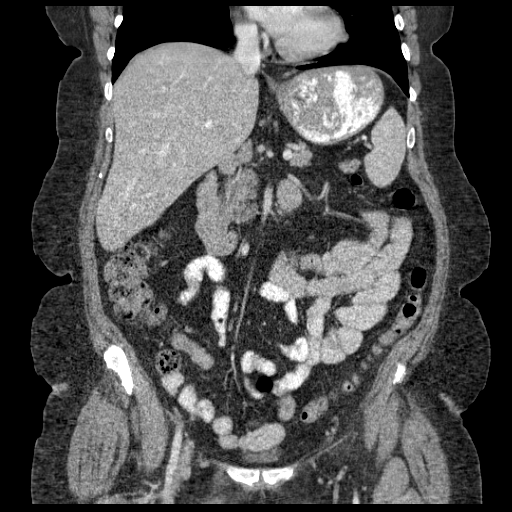
[im 99/179  soft-tissue]
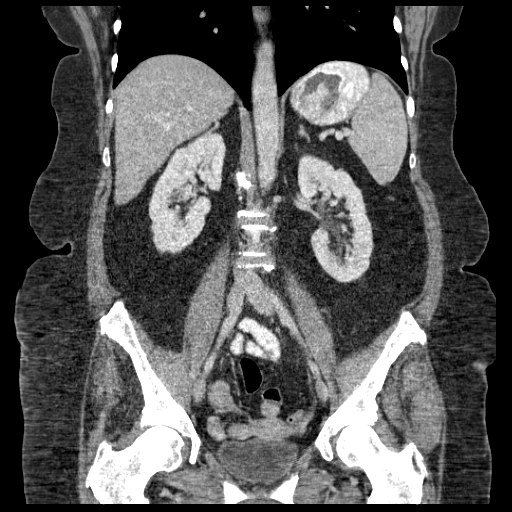

[17 of 46 positions shown; findings below may reference images not displayed]

FINDINGS: The lung bases are clear. No pleural effusion or pulmonary nodule.
The heart is normal in size. No pericardial effusion. The distal
esophagus is unremarkable.

The liver is unremarkable. No focal hepatic lesions or intrahepatic
biliary dilatation. The gallbladder is surgically absent. No common
bile duct dilatation. The pancreas is unremarkable. The spleen is
normal in size. No focal lesions. The adrenal glands and kidneys are
unremarkable. Prominent fetal lobulations of both kidneys. No renal
calculi or hydronephrosis.

The stomach, duodenum, small bowel and colon are unremarkable. No
inflammatory changes or mass lesions. No obstructive findings.
Scattered colonic diverticulitis mainly in the sigmoid area but no
findings for acute diverticulitis. The appendix is normal.

No mesenteric or retroperitoneal mass or adenopathy. The aorta is
normal in caliber. The major branch vessels are normal.

The uterus and ovaries are unremarkable. No pelvic mass, adenopathy
or free pelvic fluid collections. No inguinal mass or hernia. There
are surgical changes from and a anterior abdominal wall hernia
repair with mesh. No recurrent hernia is identified.
IMPRESSION: Unremarkable abdominal/ pelvic CT scan. No acute findings, mass
lesions or adenopathy.

Surgical changes from an anterior abdominal wall hernia repair. No
recurrent hernia.

## 2015-05-31 ENCOUNTER — Encounter: Payer: Self-pay | Admitting: Cardiology

## 2015-05-31 ENCOUNTER — Ambulatory Visit (INDEPENDENT_AMBULATORY_CARE_PROVIDER_SITE_OTHER): Payer: PRIVATE HEALTH INSURANCE | Admitting: Cardiology

## 2015-05-31 VITALS — BP 104/72 | HR 67 | Ht 66.0 in | Wt 199.1 lb

## 2015-05-31 DIAGNOSIS — R1310 Dysphagia, unspecified: Secondary | ICD-10-CM | POA: Diagnosis not present

## 2015-05-31 DIAGNOSIS — I491 Atrial premature depolarization: Secondary | ICD-10-CM | POA: Diagnosis not present

## 2015-05-31 DIAGNOSIS — Q231 Congenital insufficiency of aortic valve: Secondary | ICD-10-CM

## 2015-05-31 NOTE — Progress Notes (Signed)
Cardiology Office Note  Date: 05/31/2015   ID: Stacy Frost, DOB Jan 22, 1961, MRN [id number]  PCP: Stacy Reichert, MD  Primary Cardiologist: Stacy Dell, MD   Chief Complaint  Patient presents with  . Aortic valve disease    History of Present Illness: Stacy Frost is a 54 y.o. female last seen in September 2013. She scheduled a follow-up visit for cardiac reassessment. Interval records reviewed, she is now following with Dr. Karilyn Frost with symptoms including dysphagia to both liquids and solids, nocturnal chest pain, one ER visit noted where her chest discomfort resolved with GI cocktail. She has undergone endoscopy - notes indicate no definite stricture.  Cardiac history includes documentation of normal coronaries back in 2008, also bicuspid valve with very mild stenotic changes as of 2012. She has not had an interval follow-up echocardiogram. No exertional chest discomfort or dyspnea on exertion beyond NYHA class II. No palpitations or syncope.  She continues to follow with Dr. Renard Frost, most recent lab work noted below.   Past Medical History  Diagnosis Date  . Restless leg syndrome   . Skin tag   . Hyperlipidemia   . Obesity   . Pain in joint, ankle and foot     Chronic  . Bicuspid aortic valve     Very mild stenosis 12/12  . Migraine headache   . Essential hypertension, benign   . Type 2 diabetes mellitus   . Anxiety   . History of cardiac catheterization     Normal coronaries 2008  . Palpitations     Documented PACs by previous monitoring  . Heart murmur     Past Surgical History  Procedure Laterality Date  . Cholecystectomy  2001    "Poor EF at 17%"  . Umbilical hernia repair  2003  . Achilles tendon repair  2003    Left  . Cardiac catheterization  2008    Normal coronary arteries; normal LV systolic function; mild dilation of aortic root; mild dilation of proximal ascending aorta; likely bicuspid aortic valve  . Colonoscopy N/A 07/17/2014    Procedure:  COLONOSCOPY;  Surgeon: Stacy Hippo, MD;  [facility];  Service: Endoscopy;  Laterality: N/A;  105  . Esophagogastroduodenoscopy N/A 07/17/2014    Procedure: ESOPHAGOGASTRODUODENOSCOPY (EGD);  Surgeon: Stacy Hippo, MD;  [facility];  Service: Endoscopy;  Laterality: N/A;  [name] dilation N/A 07/17/2014    Procedure: [name] DILATION;  Surgeon: Stacy Hippo, MD;  Location: [facility];  Service: Endoscopy;  Laterality: N/A;  . Knee surgery      Current Outpatient Prescriptions  Medication Sig Dispense Refill  . acetaminophen-codeine (TYLENOL #3) 300-30 MG per tablet Take 1 tablet by mouth every 4 (four) hours as needed. For pain  0  . ALPRAZolam (XANAX) 0.5 MG tablet Take 1 tablet by mouth 3 (three) times daily as needed. For anxiety  0  . aspirin EC 81 MG tablet Take 81 mg by mouth every morning.    . canagliflozin (INVOKANA) 100 MG TABS tablet Take 100 mg by mouth daily.    . furosemide (LASIX) 20 MG tablet Take 40 mg by mouth every morning.    . Liraglutide (VICTOZA [place]) Inject 1.8 mg into the skin daily.    Marland Kitchen lisinopril (PRINIVIL,ZESTRIL) 20 MG tablet Take 20 mg by mouth daily.      . metoprolol (TOPROL-XL) 200 MG 24 hr tablet Take 200 mg by mouth daily.      . ondansetron (ZOFRAN)  4 MG tablet Take 4 mg by mouth as needed for nausea or vomiting.    . potassium chloride SA (K-DUR,KLOR-CON) 20 MEQ tablet Take 20 mEq by mouth 2 (two) times daily.    . simvastatin (ZOCOR) 40 MG tablet Take 40 mg by mouth daily.     No current facility-administered medications for this visit.    Allergies:  Amlodipine besylate; Morphine and related; Amitriptyline; Butorphanol tartrate; Compazine; and Sumatriptan   Social History: The patient  reports that she has never smoked. She has never used smokeless tobacco. She reports that she does not drink alcohol or use illicit drugs.   ROS:  Please see the history of present illness. Otherwise, complete review of systems is  positive for none.  All other systems are reviewed and negative.   Physical Exam: VS:  BP 104/72 mmHg  Pulse 67  Ht 5\' 6"  (1.676 m)  Wt 199 lb 1.9 oz (90.32 kg)  BMI 32.15 kg/m2  SpO2 98%, BMI Body mass index is 32.15 kg/(m^2).  Wt Readings from Last 3 Encounters:  05/31/15 199 lb 1.9 oz (90.32 kg)  09/23/14 208 lb (94.348 kg)  09/22/14 210 lb 1.6 oz (95.301 kg)    Patient in no acute distress. HEENT: Conjunctiva and lids normal, oropharynx clear. Neck: Supple, no elevated JVP or carotid bruits, no thyromegaly. Lungs: Clear to auscultation, nonlabored breathing at rest. Cardiac: Regular rate and rhythm, no S3, 2/6 systolic murmur, no pericardial rub. Abdomen: Soft, nontender, bowel sounds present, no guarding or rebound. Extremities: No pitting edema, distal pulses 2+. Skin: Warm and dry. Musculoskeletal: No kyphosis. Neuropsychiatric: Alert and oriented x3, affect grossly appropriate.  ECG:  Tracing from 09/15/2014 showed sinus rhythm with decreased R wave progression and nonspecific ST changes.  Recent Labwork:  04/14/2015: Potassium 4.9, BUN 23, creatinine 0.9, cholesterol 167, triglycerides 285, HDL 54, LDL 56, AST 15, ALT 12, hemoglobin A1c 6.9.  Other Studies Reviewed Today:  Echocardiogram 10/27/2011: Study Conclusions  - Left ventricle: The cavity size was normal. There was mild focal basal hypertrophy of the septum. Systolic function was normal. The estimated ejection fraction was in the range of 60% to 65%. Wall motion was normal; there were no regional wall motion abnormalities. - Aortic valve: Bicuspid; mildly thickened, mildly calcified leaflets. Cusp separation was mildly reduced. There was very mild stenosis. Valve area: 1.44cm^2(VTI). Valve area: 1.57cm^2 (Vmax). - Mitral valve: Calcified annulus. - Atrial septum: No defect or patent foramen ovale was identified. Impressions:  - Compared to the prior study performed 12/11/08, there  has been no significant interval change.  Assessment and Plan:  1. History of bicuspid aortic valve with very mild stenosis as of 2012. Systolic murmur somewhat more prominent in the last 3 years, although doubt that this is symptomatic at this point based on review of her symptomatology. She is due for a follow-up echocardiogram which will be arranged.  2. Prior history of PACs and palpitations, currently without progression.  3. History of normal coronary arteries as of 2008.  4. Recurrent dysphagia and chest pain. Encouraged regular follow-up with Dr. Karilyn Frost. Has been on PPI, but does not report significant improvement. Question whether this would be a situation where a nitrate or calcium channel blocker might be effective for potential esophageal spasm. Will defer completely to Dr. Karilyn Frost however.  Current medicines were reviewed with the patient today.   Orders Placed This Encounter  Procedures  . ECHOCARDIOGRAM COMPLETE    Disposition: FU with me in 1 year.  Signed, Jonelle Sidle, MD, [facility] 05/31/2015 4:30 PM    [facility] at [facility] [address] [place], [place], [place] [address] Phone: [phone]; Fax: [phone]

## 2015-05-31 NOTE — Patient Instructions (Signed)
Your physician recommends that you continue on your current medications as directed. Please refer to the Current Medication list given to you today. Your physician has requested that you have an echocardiogram. Echocardiography is a painless test that uses sound waves to create images of your heart. It provides your doctor with information about the size and shape of your heart and how well your heart's chambers and valves are working. This procedure takes approximately one hour. There are no restrictions for this procedure. Your physician recommends that you schedule a follow-up appointment in: 1 year. You will receive a reminder letter in the mail in about 10 months reminding you to call and schedule your appointment. If you don't receive this letter, please contact our office. 

## 2015-06-02 ENCOUNTER — Telehealth: Payer: Self-pay | Admitting: Cardiology

## 2015-06-02 NOTE — Telephone Encounter (Signed)
No precert required for echo.  Pt has [facility].

## 2015-06-02 NOTE — Telephone Encounter (Signed)
Echo scheduled for 06-08-15 at [facility]. [facility] with [facility] -Haematologist

## 2015-06-03 ENCOUNTER — Encounter (INDEPENDENT_AMBULATORY_CARE_PROVIDER_SITE_OTHER): Payer: Self-pay | Admitting: Internal Medicine

## 2015-06-03 ENCOUNTER — Other Ambulatory Visit (INDEPENDENT_AMBULATORY_CARE_PROVIDER_SITE_OTHER): Payer: Self-pay | Admitting: *Deleted

## 2015-06-03 ENCOUNTER — Ambulatory Visit (INDEPENDENT_AMBULATORY_CARE_PROVIDER_SITE_OTHER): Payer: PRIVATE HEALTH INSURANCE | Admitting: Internal Medicine

## 2015-06-03 ENCOUNTER — Encounter (INDEPENDENT_AMBULATORY_CARE_PROVIDER_SITE_OTHER): Payer: Self-pay | Admitting: *Deleted

## 2015-06-03 VITALS — BP 130/72 | HR 64 | Temp 98.0°F | Ht 66.0 in | Wt 194.0 lb

## 2015-06-03 DIAGNOSIS — R1310 Dysphagia, unspecified: Secondary | ICD-10-CM | POA: Diagnosis not present

## 2015-06-03 NOTE — Progress Notes (Signed)
Subjective:    Patient ID: Stacy Frost, female    DOB: [birth year], 54 y.o.   MRN: [id number]  HPI Presents today with c/o that she ate cheese sticks and they lodged in her esophagus. This occurred in March.  She said her husband did the Heimlich maneuver x 2 which did not help. She said everything went black. Her husband removed the cheese sticks out of her esophagus with a sweep. She says her symptoms have progressively worsened.  She says she does not have an appetite. She has lost about 14 pounds since November. She is afraid that foods will lodge. She says if she is home by her self she is not going to eat.  Hx of dysphagia and has undergone two  EGD/ED in the past.  Patient very anxious office this morning.    09/23/2014 EGD with ED(balloon).  Indications: Patient is 53 year old Caucasian female who was chronic GERD and presents with recurrent dysphagia to solids and liquids. She underwent EGD on 07/17/2014 and noted to have mild Candida esophagitis as well as reflux esophagitis but there was no stricture. She responded to esophageal dilation but benefit lasted 3 weeks only. She had barium pill esophagogram yesterday which revealed distal esophageal narrowing not allowing passage of barium pill. She is therefore returning for repeat EGD and dilation.  Impression: No evidence of erosive or Candida esophagitis. No evidence of distal esophageal stricture or ring.. Small sliding hiatal hernia. Small diverticulum at gastric antrum. Distal esophagus dilated with balloon from 18-20 mm but no mucosal disruption noted. Biopsy taken from mucosa of mid and distal esophagus for routine      07/17/2014 EGD, ED & Colonoscopy  Indications: Patient is a 23-year-old Caucasian female who presents with nausea and abdominal pain weight loss and dysphagia. She is undergoing EGD to ED and  average risk screening colonoscopy EGD findings Patchy coating of this the fascial mucosa possibly food debris. Brushing taken for KOH prep. 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 No evidence of stricture or ring. Small gastric diverticulum and antrum. Esophagus dilated by passing a 56 French bony dilator but no mucosal disruption induced.  Colonoscopy findings; Small anal papilla otherwise normal colonoscopy.  KOH prep is positive Results reviewed with the patient Mycostatin prescription sent to pharmacy   Review of Systems Past Medical History  Diagnosis Date  . Restless leg syndrome   . Skin tag   . Hyperlipidemia   . Obesity   . Pain in joint, ankle and foot     Chronic  . Bicuspid aortic valve     Very mild stenosis 12/12  . Migraine headache   . Essential hypertension, benign   . Type 2 diabetes mellitus   . Anxiety   . History of cardiac catheterization     Normal coronaries 2008  . Palpitations     Documented PACs by previous monitoring  . Heart murmur     Past Surgical History  Procedure Laterality Date  . Cholecystectomy  2001    "Poor EF at 17%"  . Umbilical hernia repair  2003  . Achilles tendon repair  2003    Left  . Cardiac catheterization  2008    Normal coronary arteries; normal LV systolic function; mild dilation of aortic root; mild dilation of proximal ascending aorta; likely bicuspid aortic valve  . Colonoscopy N/A 07/17/2014    Procedure: COLONOSCOPY;  Surgeon: Malissa Hippo, MD;  [facility];  Service: Endoscopy;  Laterality: N/A;  105  . Esophagogastroduodenoscopy N/A 07/17/2014    Procedure: ESOPHAGOGASTRODUODENOSCOPY (EGD);  Surgeon: Malissa Hippo, MD;  [facility];  Service: Endoscopy;  Laterality: N/A;  [name] dilation N/A 07/17/2014    Procedure: [name] DILATION;  Surgeon:  Malissa Hippo, MD;  [facility];  Service: Endoscopy;  Laterality: N/A;  . Knee surgery      Allergies  Allergen Reactions  . Amlodipine Besylate Shortness Of Breath    REACTION: SOB  . Morphine And Related Shortness Of Breath    Chest pain  . Amitriptyline     Unknown reaction  . Butorphanol Tartrate Other (See Comments)    REACTION: ED visit and got for migraine - not sure of response  . Compazine [Prochlorperazine Edisylate]     Altered mental status  . Sumatriptan     REACTION: HTN    Current Outpatient Prescriptions on File Prior to Visit  Medication Sig Dispense Refill  . acetaminophen-codeine (TYLENOL #3) 300-30 MG per tablet Take 1 tablet by mouth every 4 (four) hours as needed. For pain  0  . ALPRAZolam (XANAX) 0.5 MG tablet Take 1 tablet by mouth 3 (three) times daily as needed. For anxiety  0  . aspirin EC 81 MG tablet Take 81 mg by mouth every morning.    . canagliflozin (INVOKANA) 100 MG TABS tablet Take 100 mg by mouth daily.    . furosemide (LASIX) 20 MG tablet Take 40 mg by mouth every morning.    . Liraglutide (VICTOZA [place]) Inject 1.8 mg into the skin daily.    Marland Kitchen lisinopril (PRINIVIL,ZESTRIL) 20 MG tablet Take 20 mg by mouth daily.      . metoprolol (TOPROL-XL) 200 MG 24 hr tablet Take 200 mg by mouth daily.      . ondansetron (ZOFRAN) 4 MG tablet Take 4 mg by mouth as needed for nausea or vomiting.    . potassium chloride SA (K-DUR,KLOR-CON) 20 MEQ tablet Take 20 mEq by mouth 2 (two) times daily.    . simvastatin (ZOCOR) 40 MG tablet Take 40 mg by mouth daily.     No current facility-administered medications on file prior to visit.        Objective:   Physical Exam Blood pressure 130/72, pulse 64, temperature 98 F (36.7 C), height 5\' 6"  (1.676 m), weight 194 lb (87.998 kg). Alert and oriented. Skin warm and dry. Oral mucosa is moist.   . Sclera anicteric, conjunctivae is pink. Thyroid not enlarged. No cervical lymphadenopathy. Lungs clear.  Heart regular rate and rhythm.  Abdomen is soft. Bowel sounds are positive. No hepatomegaly. No abdominal masses felt. No tenderness.  No edema to lower extremities.          Assessment & Plan:  Solid food dysphagia. EGD/ED. May need further studies per Dr. Patty Sermons notes.

## 2015-06-03 NOTE — Patient Instructions (Signed)
EGD/ED 

## 2015-06-07 NOTE — Patient Instructions (Signed)
Stacy Frost  06/07/2015     @PREFPERIOPPHARMACY @   Your procedure is scheduled on  06/11/2015  Report to [facility] at  615  A.M.  Call this number if you have problems the morning of surgery:  [phone]   Remember:  Do not eat food or drink liquids after midnight.  Take these medicines the morning of surgery with A SIP OF WATER Xanax, lisinopril, metoprolol, zofran. Tylenol #3 if needed.   Do not wear jewelry, make-up or nail polish.  Do not wear lotions, powders, or perfumes.   Do not shave 48 hours prior to surgery.  Men may shave face and neck.  Do not bring valuables to the hospital.  [facility] is not responsible for any belongings or valuables.  Contacts, dentures or bridgework may not be worn into surgery.  Leave your suitcase in the car.  After surgery it may be brought to your room.  For patients admitted to the hospital, discharge time will be determined by your treatment team.  Patients discharged the day of surgery will not be allowed to drive home.   Name and phone number of your driver:   family Special instructions:  Follow the diet instructions given to you by Dr Patty Sermons office.  Please read over the following fact sheets that you were given. Pain Booklet, Coughing and Deep Breathing, Surgical Site Infection Prevention, Anesthesia Post-op Instructions and Care and Recovery After Surgery      Esophageal Dilatation The esophagus is the long, narrow tube which carries food and liquid from the mouth to the stomach. Esophageal dilatation is the technique used to stretch a blocked or narrowed portion of the esophagus. This procedure is used when a part of the esophagus has become so narrow that it becomes difficult, painful or even impossible to swallow. This is generally an uncomplicated form of treatment. When this is not successful, chest surgery may be required. This is a much more extensive form of treatment with a longer recovery time. CAUSES    Some of the more common causes of blockage or strictures of the esophagus are:  Narrowing from longstanding inflammation (soreness and redness) of the lower esophagus. This comes from the constant exposure of the lower esophagus to the acid which bubbles up from the stomach. Over time this causes scarring and narrowing of the lower esophagus.  Hiatal hernia in which a small part of the stomach bulges (herniates) up through the diaphragm. This can cause a gradual narrowing of the end of the esophagus.  Schatzki ring is a narrow ring of benign (non-cancerous) fibrous tissue which constricts the lower esophagus. The reason for this is not known.  Scleroderma is a connective tissue disorder that affects the esophagus and makes swallowing difficult.  Achalasia is an absence of nerves to the lower esophagus and to the esophageal sphincter. This is the circular muscle between the stomach and esophagus that relaxes to allow food into the stomach. After swallowing, it contracts to keep food in the stomach. This absence of nerves may be congenital (present since birth). This can cause irregular spasms of the lower esophageal muscle. This spasm does not open up to allow food and fluid through. The result is a persistent blockage with subsequent slow trickling of the esophageal contents into the stomach.  Strictures may develop from swallowing materials which damage the esophagus. Some examples are strong acids or alkalis such as lye.  Growths such as benign (non-cancerous) and  malignant (cancerous) tumors can block the esophagus.  Hereditary (present since birth) causes. DIAGNOSIS  Your caregiver often suspects this problem by taking a medical history. They will also do a physical exam. They can then prove their suspicions using X-rays and endoscopy. Endoscopy is an exam in which a tube like a small, flexible telescope is used to look at your esophagus.  TREATMENT There are different stretching (dilating)  techniques that can be used. Simple bougie dilatation may be done in the office. This usually takes only a couple minutes. A numbing (anesthetic) spray of the throat is used. Endoscopy, when done, is done in an endoscopy suite under mild sedation. When fluoroscopy is used, the procedure is performed in X-ray. Other techniques require a little longer time. Recovery is usually quick. There is no waiting time to begin eating and drinking to test success of the treatment. Following are some of the methods used. Narrowing of the esophagus is treated by making it bigger. Commonly this is a mechanical problem which can be treated with stretching. This can be done in different ways. Your caregiver will discuss these with you. Some of the means used are:  A series of graduated (increasing thickness) flexible dilators can be used. These are weighted tubes passed through the esophagus into the stomach. The tubes used become progressively larger until the desired stretched size is reached. Graduated dilators are a simple and quick way of opening the esophagus. No visualization is required.  Another method is the use of endoscopy to place a flexible wire across the stricture. The endoscope is removed and the wire left in place. A dilator with a hole through it from end to end is guided down the esophagus and across the stricture. One or more of these dilators are passed over the wire. At the end of the exam, the wire is removed. This type of treatment may be performed in the X-ray department under fluoroscopy. An advantage of this procedure is the examiner is visualizing the end opening in the esophagus.  Stretching of the esophagus may be done using balloons. Deflated balloons are placed through the endoscope and across the stricture. This type of balloon dilatation is often done at the time of endoscopy or fluoroscopy. Flexible endoscopy allows the examiner to directly view the stricture. A balloon is inserted in the  deflated form into the area of narrowing. It is then inflated with air to a certain pressure that is preset for a given circumference. When inflated, it becomes sausage shaped, stretched, and makes the stricture larger.  Achalasia requires a longer, larger balloon-type dilator. This is frequently done under X-ray control. In this situation, the spastic muscle fibers in the lower esophagus are stretched. All of the above procedures make the passage of food and water into the stomach easier. They also make it easier for stomach contents to reflux back into the esophagus. Special medications may be used following the procedure to help prevent further stricturing. Proton-pump inhibitor medications are good at decreasing the amount of acid in the stomach juice. When stomach juice refluxes into the esophagus, the juice is no longer as acidic and is less likely to burn or scar the esophagus. RISKS AND COMPLICATIONS Esophageal dilatation is usually performed effectively and without problems. Some complications that can occur are:  A small amount of bleeding almost always happens where the stretching takes place. If this is too excessive it may require more aggressive treatment.  An uncommon complication is perforation (making a hole) of the  esophagus. The esophagus is thin. It is easy to make a hole in it. If this happens, an operation may be necessary to repair this.  A small, undetected perforation could lead to an infection in the chest. This can be very serious. HOME CARE INSTRUCTIONS   If you received sedation for your procedure, do not drive, make important decisions, or perform any activities requiring your full coordination. Do not drink alcohol, take sedatives, or use any mind altering chemicals unless instructed by your caregiver.  You may use throat lozenges or warm salt water gargles if you have throat discomfort.  You can begin eating and drinking normally on return home unless instructed  otherwise. Do not purposely try to force large chunks of food down to test the benefits of your procedure.  Mild discomfort can be eased with sips of ice water.  Medications for discomfort may or may not be needed. SEEK IMMEDIATE MEDICAL CARE IF:   You begin vomiting up blood.  You develop black, tarry stools.  You develop chills or an unexplained temperature of over 101F (38.3C)  You develop chest or abdominal pain.  You develop shortness of breath, or feel light-headed or faint.  Your swallowing is becoming more painful, difficult, or you are unable to swallow. MAKE SURE YOU:   Understand these instructions.  Will watch your condition.  Will get help right away if you are not doing well or get worse. Document Released: 12/07/2005 Document Revised: 03/02/2014 Document Reviewed: 01/24/2006 [facility] Patient Information 2015 [place], [place]. This information is not intended to replace advice given to you by your health care provider. Make sure you discuss any questions you have with your health care provider. Esophagogastroduodenoscopy Esophagogastroduodenoscopy (EGD) is a procedure to examine the lining of the esophagus, stomach, and first part of the small intestine (duodenum). A long, flexible, lighted tube with a camera attached (endoscope) is inserted down the throat to view these organs. This procedure is done to detect problems or abnormalities, such as inflammation, bleeding, ulcers, or growths, in order to treat them. The procedure lasts about 5-20 minutes. It is usually an outpatient procedure, but it may need to be performed in emergency cases in the hospital. LET YOUR CAREGIVER KNOW ABOUT:   Allergies to food or medicine.  All medicines you are taking, including vitamins, herbs, eyedrops, and over-the-counter medicines and creams.  Use of steroids (by mouth or creams).  Previous problems you or members of your family have had with the use of anesthetics.  Any blood  disorders you have.  Previous surgeries you have had.  Other health problems you have.  Possibility of pregnancy, if this applies. RISKS AND COMPLICATIONS  Generally, EGD is a safe procedure. However, as with any procedure, complications can occur. Possible complications include:  Infection.  Bleeding.  Tearing (perforation) of the esophagus, stomach, or duodenum.  Difficulty breathing or not being able to breath.  Excessive sweating.  Spasms of the larynx.  Slowed heartbeat.  Low blood pressure. BEFORE THE PROCEDURE  Do not eat or drink anything for 6-8 hours before the procedure or as directed by your caregiver.  Ask your caregiver about changing or stopping your regular medicines.  If you wear dentures, be prepared to remove them before the procedure.  Arrange for someone to drive you home after the procedure. PROCEDURE   A vein will be accessed to give medicines and fluids. A medicine to relax you (sedative) and a pain reliever will be given through that access into the  vein.  A numbing medicine (local anesthetic) may be sprayed on your throat for comfort and to stop you from gagging or coughing.  A mouth guard may be placed in your mouth to protect your teeth and to keep you from biting on the endoscope.  You will be asked to lie on your left side.  The endoscope is inserted down your throat and into the esophagus, stomach, and duodenum.  Air is put through the endoscope to allow your caregiver to view the lining of your esophagus clearly.  The esophagus, stomach, and duodenum is then examined. During the exam, your caregiver may:  Remove tissue to be examined under a microscope (biopsy) for inflammation, infection, or other medical problems.  Remove growths.  Remove objects (foreign bodies) that are stuck.  Treat any bleeding with medicines or other devices that stop tissues from bleeding (hot cautery, clipping devices).  Widen (dilate) or stretch  narrowed areas of the esophagus and stomach.  The endoscope will then be withdrawn. AFTER THE PROCEDURE  You will be taken to a recovery area to be monitored. You will be able to go home once you are stable and alert.  Do not eat or drink anything until the local anesthetic and numbing medicines have worn off. You may choke.  It is normal to feel bloated, have pain with swallowing, or have a sore throat for a short time. This will wear off.  Your caregiver should be able to discuss his or her findings with you. It will take longer to discuss the test results if any biopsies were taken. Document Released: 02/16/2005 Document Revised: 03/02/2014 Document Reviewed: 09/18/2012 [facility] Patient Information 2015 [place], [place]. This information is not intended to replace advice given to you by your health care provider. Make sure you discuss any questions you have with your health care provider. PATIENT INSTRUCTIONS POST-ANESTHESIA  IMMEDIATELY FOLLOWING SURGERY:  Do not drive or operate machinery for the first twenty four hours after surgery.  Do not make any important decisions for twenty four hours after surgery or while taking narcotic pain medications or sedatives.  If you develop intractable nausea and vomiting or a severe headache please notify your doctor immediately.  FOLLOW-UP:  Please make an appointment with your surgeon as instructed. You do not need to follow up with anesthesia unless specifically instructed to do so.  WOUND CARE INSTRUCTIONS (if applicable):  Keep a dry clean dressing on the anesthesia/puncture wound site if there is drainage.  Once the wound has quit draining you may leave it open to air.  Generally you should leave the bandage intact for twenty four hours unless there is drainage.  If the epidural site drains for more than 36-48 hours please call the anesthesia department.  QUESTIONS?:  Please feel free to call your physician or the hospital operator if you have  any questions, and they will be happy to assist you.

## 2015-06-08 ENCOUNTER — Ambulatory Visit (HOSPITAL_COMMUNITY)
Admission: RE | Admit: 2015-06-08 | Discharge: 2015-06-08 | Disposition: A | Discharge status: Home / Self Care | Payer: PRIVATE HEALTH INSURANCE | Source: Ambulatory Visit | Attending: Cardiology | Admitting: Cardiology

## 2015-06-08 ENCOUNTER — Telehealth: Payer: Self-pay | Admitting: Cardiology

## 2015-06-08 DIAGNOSIS — E119 Type 2 diabetes mellitus without complications: Secondary | ICD-10-CM | POA: Insufficient documentation

## 2015-06-08 DIAGNOSIS — I10 Essential (primary) hypertension: Secondary | ICD-10-CM | POA: Diagnosis not present

## 2015-06-08 DIAGNOSIS — I371 Nonrheumatic pulmonary valve insufficiency: Secondary | ICD-10-CM | POA: Insufficient documentation

## 2015-06-08 DIAGNOSIS — I083 Combined rheumatic disorders of mitral, aortic and tricuspid valves: Secondary | ICD-10-CM | POA: Diagnosis not present

## 2015-06-08 DIAGNOSIS — Q231 Congenital insufficiency of aortic valve: Secondary | ICD-10-CM | POA: Diagnosis not present

## 2015-06-08 NOTE — Telephone Encounter (Signed)
Stacy Frost called wanting test results of echo. Having surgery on 06-11-15 at [facility].

## 2015-06-08 NOTE — Telephone Encounter (Signed)
Echo results sitting in [facility]'s basket for review. Patient will be contact once he reviews results. Patient informed.

## 2015-06-09 ENCOUNTER — Encounter (HOSPITAL_COMMUNITY)
Admission: RE | Admit: 2015-06-09 | Discharge: 2015-06-09 | Disposition: A | Discharge status: Home / Self Care | Payer: PRIVATE HEALTH INSURANCE | Source: Ambulatory Visit | Attending: Internal Medicine | Admitting: Internal Medicine

## 2015-06-09 ENCOUNTER — Encounter (HOSPITAL_COMMUNITY): Payer: Self-pay

## 2015-06-09 ENCOUNTER — Telehealth: Payer: Self-pay | Admitting: *Deleted

## 2015-06-09 DIAGNOSIS — R112 Nausea with vomiting, unspecified: Secondary | ICD-10-CM | POA: Diagnosis not present

## 2015-06-09 DIAGNOSIS — Z7982 Long term (current) use of aspirin: Secondary | ICD-10-CM | POA: Diagnosis not present

## 2015-06-09 DIAGNOSIS — R011 Cardiac murmur, unspecified: Secondary | ICD-10-CM | POA: Diagnosis not present

## 2015-06-09 DIAGNOSIS — I10 Essential (primary) hypertension: Secondary | ICD-10-CM | POA: Diagnosis not present

## 2015-06-09 DIAGNOSIS — G2581 Restless legs syndrome: Secondary | ICD-10-CM | POA: Diagnosis not present

## 2015-06-09 DIAGNOSIS — R1319 Other dysphagia: Secondary | ICD-10-CM | POA: Diagnosis present

## 2015-06-09 DIAGNOSIS — K226 Gastro-esophageal laceration-hemorrhage syndrome: Secondary | ICD-10-CM | POA: Diagnosis not present

## 2015-06-09 DIAGNOSIS — F419 Anxiety disorder, unspecified: Secondary | ICD-10-CM | POA: Diagnosis not present

## 2015-06-09 DIAGNOSIS — K314 Gastric diverticulum: Secondary | ICD-10-CM | POA: Diagnosis not present

## 2015-06-09 DIAGNOSIS — E785 Hyperlipidemia, unspecified: Secondary | ICD-10-CM | POA: Diagnosis not present

## 2015-06-09 DIAGNOSIS — K219 Gastro-esophageal reflux disease without esophagitis: Secondary | ICD-10-CM | POA: Diagnosis not present

## 2015-06-09 DIAGNOSIS — G43909 Migraine, unspecified, not intractable, without status migrainosus: Secondary | ICD-10-CM | POA: Diagnosis not present

## 2015-06-09 DIAGNOSIS — I491 Atrial premature depolarization: Secondary | ICD-10-CM | POA: Diagnosis not present

## 2015-06-09 DIAGNOSIS — E669 Obesity, unspecified: Secondary | ICD-10-CM | POA: Diagnosis not present

## 2015-06-09 DIAGNOSIS — K259 Gastric ulcer, unspecified as acute or chronic, without hemorrhage or perforation: Secondary | ICD-10-CM | POA: Diagnosis not present

## 2015-06-09 DIAGNOSIS — Z885 Allergy status to narcotic agent status: Secondary | ICD-10-CM | POA: Diagnosis not present

## 2015-06-09 DIAGNOSIS — E119 Type 2 diabetes mellitus without complications: Secondary | ICD-10-CM | POA: Diagnosis not present

## 2015-06-09 DIAGNOSIS — Q231 Congenital insufficiency of aortic valve: Secondary | ICD-10-CM | POA: Diagnosis not present

## 2015-06-09 DIAGNOSIS — Z888 Allergy status to other drugs, medicaments and biological substances status: Secondary | ICD-10-CM | POA: Diagnosis not present

## 2015-06-09 DIAGNOSIS — Z6833 Body mass index (BMI) 33.0-33.9, adult: Secondary | ICD-10-CM | POA: Diagnosis not present

## 2015-06-09 LAB — CBC WITH DIFFERENTIAL/PLATELET
BASOS ABS: 0 10*3/uL (ref 0.0–0.1)
Basophils Relative: 0 % (ref 0–1)
EOS PCT: 2 % (ref 0–5)
Eosinophils Absolute: 0.1 10*3/uL (ref 0.0–0.7)
HCT: 40 % (ref 36.0–46.0)
Hemoglobin: 13.4 g/dL (ref 12.0–15.0)
Lymphocytes Relative: 33 % (ref 12–46)
Lymphs Abs: 2.1 10*3/uL (ref 0.7–4.0)
MCH: 29.5 pg (ref 26.0–34.0)
MCHC: 33.5 g/dL (ref 30.0–36.0)
MCV: 87.9 fL (ref 78.0–100.0)
Monocytes Absolute: 0.4 10*3/uL (ref 0.1–1.0)
Monocytes Relative: 6 % (ref 3–12)
Neutro Abs: 3.7 10*3/uL (ref 1.7–7.7)
Neutrophils Relative %: 59 % (ref 43–77)
Platelets: 275 10*3/uL (ref 150–400)
RBC: 4.55 MIL/uL (ref 3.87–5.11)
RDW: 12.5 % (ref 11.5–15.5)
WBC: 6.3 10*3/uL (ref 4.0–10.5)

## 2015-06-09 LAB — BASIC METABOLIC PANEL
ANION GAP: 13 (ref 5–15)
BUN: 21 mg/dL — ABNORMAL HIGH (ref 6–20)
CO2: 25 mmol/L (ref 22–32)
Calcium: 10 mg/dL (ref 8.9–10.3)
Chloride: 100 mmol/L — ABNORMAL LOW (ref 101–111)
Creatinine, Ser: 0.83 mg/dL (ref 0.44–1.00)
GFR calc non Af Amer: >60 mL/min (ref >60)
GLUCOSE: 152 mg/dL — AB (ref 65–99)
Potassium: 4.2 mmol/L (ref 3.5–5.1)
Sodium: 138 mmol/L (ref 135–145)

## 2015-06-09 NOTE — Telephone Encounter (Signed)
-----   Message from Jonelle Sidle, MD sent at 06/09/2015  7:55 AM EDT ----- Reviewed report. Aortic stenosis has progressed to mild to moderate range since last assessment. Unlikely to be of symptomatic significance at this point however. Can continue to follow over time.

## 2015-06-09 NOTE — Telephone Encounter (Signed)
Patient informed. 

## 2015-06-11 ENCOUNTER — Ambulatory Visit (HOSPITAL_COMMUNITY): Payer: PRIVATE HEALTH INSURANCE | Admitting: Anesthesiology

## 2015-06-11 ENCOUNTER — Ambulatory Visit (HOSPITAL_COMMUNITY)
Admission: RE | Admit: 2015-06-11 | Discharge: 2015-06-11 | Disposition: A | Discharge status: Home / Self Care | Payer: PRIVATE HEALTH INSURANCE | Source: Ambulatory Visit | Attending: Internal Medicine | Admitting: Internal Medicine

## 2015-06-11 ENCOUNTER — Encounter (HOSPITAL_COMMUNITY): Payer: Self-pay | Admitting: *Deleted

## 2015-06-11 ENCOUNTER — Encounter (HOSPITAL_COMMUNITY)
Admission: RE | Disposition: A | Discharge status: Home / Self Care | Payer: Self-pay | Source: Ambulatory Visit | Attending: Internal Medicine

## 2015-06-11 DIAGNOSIS — E119 Type 2 diabetes mellitus without complications: Secondary | ICD-10-CM | POA: Insufficient documentation

## 2015-06-11 DIAGNOSIS — K314 Gastric diverticulum: Secondary | ICD-10-CM | POA: Diagnosis not present

## 2015-06-11 DIAGNOSIS — Z888 Allergy status to other drugs, medicaments and biological substances status: Secondary | ICD-10-CM | POA: Insufficient documentation

## 2015-06-11 DIAGNOSIS — F419 Anxiety disorder, unspecified: Secondary | ICD-10-CM | POA: Insufficient documentation

## 2015-06-11 DIAGNOSIS — Z885 Allergy status to narcotic agent status: Secondary | ICD-10-CM | POA: Insufficient documentation

## 2015-06-11 DIAGNOSIS — E669 Obesity, unspecified: Secondary | ICD-10-CM | POA: Insufficient documentation

## 2015-06-11 DIAGNOSIS — R1319 Other dysphagia: Secondary | ICD-10-CM | POA: Diagnosis not present

## 2015-06-11 DIAGNOSIS — I10 Essential (primary) hypertension: Secondary | ICD-10-CM | POA: Insufficient documentation

## 2015-06-11 DIAGNOSIS — R112 Nausea with vomiting, unspecified: Secondary | ICD-10-CM | POA: Insufficient documentation

## 2015-06-11 DIAGNOSIS — Q231 Congenital insufficiency of aortic valve: Secondary | ICD-10-CM | POA: Insufficient documentation

## 2015-06-11 DIAGNOSIS — Z6833 Body mass index (BMI) 33.0-33.9, adult: Secondary | ICD-10-CM | POA: Insufficient documentation

## 2015-06-11 DIAGNOSIS — I491 Atrial premature depolarization: Secondary | ICD-10-CM | POA: Insufficient documentation

## 2015-06-11 DIAGNOSIS — G43909 Migraine, unspecified, not intractable, without status migrainosus: Secondary | ICD-10-CM | POA: Insufficient documentation

## 2015-06-11 DIAGNOSIS — G2581 Restless legs syndrome: Secondary | ICD-10-CM | POA: Insufficient documentation

## 2015-06-11 DIAGNOSIS — R011 Cardiac murmur, unspecified: Secondary | ICD-10-CM | POA: Insufficient documentation

## 2015-06-11 DIAGNOSIS — R1310 Dysphagia, unspecified: Secondary | ICD-10-CM

## 2015-06-11 DIAGNOSIS — K208 Other esophagitis: Secondary | ICD-10-CM | POA: Diagnosis not present

## 2015-06-11 DIAGNOSIS — K226 Gastro-esophageal laceration-hemorrhage syndrome: Secondary | ICD-10-CM | POA: Diagnosis not present

## 2015-06-11 DIAGNOSIS — K219 Gastro-esophageal reflux disease without esophagitis: Secondary | ICD-10-CM | POA: Insufficient documentation

## 2015-06-11 DIAGNOSIS — K259 Gastric ulcer, unspecified as acute or chronic, without hemorrhage or perforation: Secondary | ICD-10-CM | POA: Insufficient documentation

## 2015-06-11 DIAGNOSIS — E785 Hyperlipidemia, unspecified: Secondary | ICD-10-CM | POA: Insufficient documentation

## 2015-06-11 DIAGNOSIS — Z7982 Long term (current) use of aspirin: Secondary | ICD-10-CM | POA: Insufficient documentation

## 2015-06-11 HISTORY — PX: ESOPHAGOGASTRODUODENOSCOPY (EGD) WITH PROPOFOL: SHX5813

## 2015-06-11 HISTORY — PX: ESOPHAGEAL DILATION: SHX303

## 2015-06-11 LAB — GLUCOSE, CAPILLARY
Glucose-Capillary: 155 mg/dL — ABNORMAL HIGH (ref 65–99)
Glucose-Capillary: 115 mg/dL — ABNORMAL HIGH (ref 65–99)

## 2015-06-11 SURGERY — ESOPHAGOGASTRODUODENOSCOPY (EGD) WITH PROPOFOL
Anesthesia: Monitor Anesthesia Care

## 2015-06-11 MED ORDER — PROPOFOL 10 MG/ML IV BOLUS
INTRAVENOUS | Status: AC
  Filled 2015-06-11: qty 20

## 2015-06-11 MED ORDER — MIDAZOLAM HCL 2 MG/2ML IJ SOLN
INTRAMUSCULAR | Status: AC
  Filled 2015-06-11: qty 2

## 2015-06-11 MED ORDER — MIDAZOLAM HCL 5 MG/5ML IJ SOLN
INTRAMUSCULAR | Status: DC | PRN
  Administered 2015-06-11: 2 mg via INTRAVENOUS

## 2015-06-11 MED ORDER — STERILE WATER FOR IRRIGATION IR SOLN
Status: DC | PRN
  Administered 2015-06-11: 1000 mL

## 2015-06-11 MED ORDER — BUTAMBEN-TETRACAINE-BENZOCAINE 2-2-14 % EX AERO
INHALATION_SPRAY | CUTANEOUS | Status: AC
  Filled 2015-06-11: qty 20

## 2015-06-11 MED ORDER — ONDANSETRON HCL 4 MG/2ML IJ SOLN
4.0000 mg | Freq: Once | INTRAMUSCULAR | Status: AC
  Administered 2015-06-11: 4 mg via INTRAVENOUS

## 2015-06-11 MED ORDER — BUTAMBEN-TETRACAINE-BENZOCAINE 2-2-14 % EX AERO
1.0000 | INHALATION_SPRAY | Freq: Once | CUTANEOUS | Status: AC
  Administered 2015-06-11: 1 via TOPICAL
  Filled 2015-06-11: qty 20

## 2015-06-11 MED ORDER — MIDAZOLAM HCL 2 MG/2ML IJ SOLN
1.0000 mg | INTRAMUSCULAR | Status: DC | PRN
  Administered 2015-06-11: 2 mg via INTRAVENOUS

## 2015-06-11 MED ORDER — ONDANSETRON HCL 4 MG/2ML IJ SOLN
INTRAMUSCULAR | Status: AC
  Filled 2015-06-11: qty 2

## 2015-06-11 MED ORDER — FENTANYL CITRATE (PF) 100 MCG/2ML IJ SOLN
INTRAMUSCULAR | Status: AC
  Filled 2015-06-11: qty 2

## 2015-06-11 MED ORDER — FENTANYL CITRATE (PF) 100 MCG/2ML IJ SOLN
25.0000 ug | INTRAMUSCULAR | Status: AC
  Administered 2015-06-11 (×2): 25 ug via INTRAVENOUS

## 2015-06-11 MED ORDER — PROPOFOL INFUSION 10 MG/ML OPTIME
INTRAVENOUS | Status: DC | PRN
  Administered 2015-06-11: 08:00:00 via INTRAVENOUS
  Administered 2015-06-11: 150 ug/kg/min via INTRAVENOUS

## 2015-06-11 MED ORDER — ONDANSETRON HCL 4 MG/2ML IJ SOLN
4.0000 mg | Freq: Once | INTRAMUSCULAR | Status: AC | PRN
  Administered 2015-06-11: 4 mg via INTRAVENOUS

## 2015-06-11 MED ORDER — MIDAZOLAM HCL 2 MG/2ML IJ SOLN
INTRAMUSCULAR | Status: AC
  Filled 2015-06-11: qty 4

## 2015-06-11 MED ORDER — PANTOPRAZOLE SODIUM 40 MG PO TBEC: 40.0000 mg | DELAYED_RELEASE_TABLET | Freq: Two times a day (BID) | ORAL | Status: DC

## 2015-06-11 MED ORDER — FENTANYL CITRATE (PF) 100 MCG/2ML IJ SOLN
25.0000 ug | INTRAMUSCULAR | Status: DC | PRN
  Administered 2015-06-11 (×2): 50 ug via INTRAVENOUS

## 2015-06-11 MED ORDER — LACTATED RINGERS IV SOLN
INTRAVENOUS | Status: DC
  Administered 2015-06-11: 07:00:00 via INTRAVENOUS

## 2015-06-11 SURGICAL SUPPLY — 31 items
BALLN CRE LF 10-12 240X5.5 (BALLOONS)
BALLN CRE LF 10-12MM 240X5.5 (BALLOONS)
BALLN DILATOR CRE 12-15 240 (BALLOONS)
BALLN DILATOR CRE 15-18 240 (BALLOONS) IMPLANT
BALLN DILATOR CRE 18-20 240 (BALLOONS) IMPLANT
BALLN DILATOR CRE WIREGUIDE (BALLOONS)
BALLOON CRE LF 10-12 240X5.5 (BALLOONS) IMPLANT
BALLOON DILATOR CRE 12-15 240 (BALLOONS) IMPLANT
BALLOON DILATOR CRE WIREGUIDE (BALLOONS) IMPLANT
BLOCK BITE 60FR ADLT L/F BLUE (MISCELLANEOUS) ×2 IMPLANT
ELECT REM PT RETURN 9FT ADLT (ELECTROSURGICAL)
ELECTRODE REM PT RTRN 9FT ADLT (ELECTROSURGICAL) IMPLANT
FLOOR PAD 36X40 (MISCELLANEOUS) ×3
FORCEP COLD BIOPSY (CUTTING FORCEPS) IMPLANT
FORCEPS BIOP RAD 4 LRG CAP 4 (CUTTING FORCEPS) ×2 IMPLANT
FORMALIN 10 PREFIL 20ML (MISCELLANEOUS) ×2 IMPLANT
KIT ENDO PROCEDURE PEN (KITS) ×3 IMPLANT
MANIFOLD NEPTUNE II (INSTRUMENTS) ×3 IMPLANT
NDL SCLEROTHERAPY 25GX240 (NEEDLE) IMPLANT
NEEDLE SCLEROTHERAPY 25GX240 (NEEDLE) IMPLANT
PAD FLOOR 36X40 (MISCELLANEOUS) IMPLANT
PROBE APC STR FIRE (PROBE) IMPLANT
PROBE INJECTION GOLD (MISCELLANEOUS)
PROBE INJECTION GOLD 7FR (MISCELLANEOUS) IMPLANT
SNARE ROTATE MED OVAL 20MM (MISCELLANEOUS) IMPLANT
SNARE SHORT THROW 13M SML OVAL (MISCELLANEOUS) ×1 IMPLANT
SYR 50ML LL SCALE MARK (SYRINGE) ×2 IMPLANT
SYR INFLATE BILIARY GAUGE (MISCELLANEOUS) IMPLANT
SYR INFLATION 60ML (SYRINGE) IMPLANT
TUBING IRRIGATION ENDOGATOR (MISCELLANEOUS) ×2 IMPLANT
WATER STERILE IRR 1000ML POUR (IV SOLUTION) ×2 IMPLANT

## 2015-06-11 NOTE — H&P (Signed)
Stacy Frost is an 54 y.o. female.   Chief Complaint: Patient is here for EGD and ED. HPI: Patient is 54 year old Caucasian female who presents with recurrent dysphagia to solids. She's had multiple episodes of food impaction and some early with Heimlich maneuver. She also complains of frequent nausea and postprandial vomiting at least 2-3 times a little she has no appetite. She has lost 14 pounds since November last year. She states all in all she has lost 40 pounds since her dysphagia began last year.. She states she is not trying to lose weight. She denies melena or rectal bleeding. She points to lower sternal area site of bolus obstruction. She has difficulty with solids and liquids. Asian has undergone EGD and ED in September and November last year and had transient response to dilation. Her second EGD she was also found to have mild Candida esophagitis. She is on low-dose aspirin and uses OTC naproxen on as-needed basis.  Past Medical History  Diagnosis Date  . Restless leg syndrome   . Skin tag   . Hyperlipidemia   . Obesity   . Pain in joint, ankle and foot     Chronic  . Bicuspid aortic valve     Very mild stenosis 12/12  . Migraine headache   . Essential hypertension, benign   . Type 2 diabetes mellitus   . Anxiety   . History of cardiac catheterization     Normal coronaries 2008  . Palpitations     Documented PACs by previous monitoring  . Heart murmur     Past Surgical History  Procedure Laterality Date  . Cholecystectomy  2001    "Poor EF at 17%"  . Umbilical hernia repair  2003  . Achilles tendon repair  2003    Left  . Cardiac catheterization  2008    Normal coronary arteries; normal LV systolic function; mild dilation of aortic root; mild dilation of proximal ascending aorta; likely bicuspid aortic valve  . Colonoscopy N/A 07/17/2014    Procedure: COLONOSCOPY;  Surgeon: Stacy Hippo, MD;  Location: [facility];  Service: Endoscopy;  Laterality: N/A;  105  .  Esophagogastroduodenoscopy N/A 07/17/2014    Procedure: ESOPHAGOGASTRODUODENOSCOPY (EGD);  Surgeon: Stacy Hippo, MD;  Location: [facility];  Service: Endoscopy;  Laterality: N/A;  [name] dilation N/A 07/17/2014    Procedure: [name] DILATION;  Surgeon: Stacy Hippo, MD;  Location: [facility];  Service: Endoscopy;  Laterality: N/A;  . Knee surgery      Family History  Problem Relation Age of Onset  . Depression Mother   . Cancer Mother     Lung mets (unsure as to what kind)  . Hypertension Father   . Depression Father   . Heart attack Father   . Depression Daughter   . Anxiety disorder Daughter   . Stroke Daughter    Social History:  reports that she has never smoked. She has never used smokeless tobacco. She reports that she does not drink alcohol or use illicit drugs.  Allergies:  Allergies  Allergen Reactions  . Amlodipine Besylate Shortness Of Breath    REACTION: SOB  . Morphine And Related Shortness Of Breath    Chest pain  . Amitriptyline     Unknown reaction  . Butorphanol Tartrate Other (See Comments)    REACTION: ED visit and got for migraine - not sure of response  . Compazine [Prochlorperazine Edisylate]     Altered mental status  . Sumatriptan  REACTION: HTN    Medications Prior to Admission  Medication Sig Dispense Refill  . ALPRAZolam (XANAX) 0.5 MG tablet Take 1 tablet by mouth 3 (three) times daily as needed. For anxiety  0  . lisinopril (PRINIVIL,ZESTRIL) 20 MG tablet Take 20 mg by mouth daily.      . metoprolol (TOPROL-XL) 200 MG 24 hr tablet Take 200 mg by mouth daily.      . simvastatin (ZOCOR) 40 MG tablet Take 40 mg by mouth daily.    Marland Kitchen acetaminophen-codeine (TYLENOL #3) 300-30 MG per tablet Take 1 tablet by mouth every 4 (four) hours as needed. For pain  0  . aspirin EC 81 MG tablet Take 81 mg by mouth every morning.    . canagliflozin (INVOKANA) 100 MG TABS tablet Take 100 mg by mouth daily.    . furosemide (LASIX) 20 MG tablet  Take 40 mg by mouth every morning.    . Liraglutide (VICTOZA [place]) Inject 1.8 mg into the skin daily.    . naproxen sodium (ANAPROX) 220 MG tablet Take 440 mg by mouth daily as needed (pain).    . ondansetron (ZOFRAN) 4 MG tablet Take 4 mg by mouth as needed for nausea or vomiting.    . polyethylene glycol (MIRALAX / GLYCOLAX) packet Take 17 g by mouth once.    . potassium chloride SA (K-DUR,KLOR-CON) 20 MEQ tablet Take 20 mEq by mouth 2 (two) times daily.    . Probiotic Product (FLORAJEN3 PO) Take 1 capsule by mouth daily.      Results for orders placed or performed during the hospital encounter of 06/11/15 (from the past 48 hour(s))  Glucose, capillary     Status: Abnormal   Collection Time: 06/11/15  6:38 AM  Result Value Ref Range   Glucose-Capillary 155 (H) 65 - 99 mg/dL   Comment 1 Document in Chart    No results found.  ROS  Blood pressure 123/75, temperature 97.7 F (36.5 C), temperature source Oral, resp. rate 14, SpO2 96 %. Physical Exam  Constitutional: She appears well-developed and well-nourished.  HENT:  Mouth/Throat: Oropharynx is clear and moist.  Eyes: Conjunctivae are normal. No scleral icterus.  Neck: No thyromegaly present.  Cardiovascular: Normal rate, regular rhythm and normal heart sounds.   No murmur heard. Respiratory: Effort normal and breath sounds normal.  GI:  Abdomen is full it is soft with mild midepigastric and LLQ tenderness. No organomegaly or masses.  Musculoskeletal: She exhibits no edema.  Lymphadenopathy:    She has no cervical adenopathy.  Neurological: She is alert.  Skin: Skin is warm and dry.     Assessment/Plan Solid food dysphagia. Recurrent nausea and vomiting. EGD and ED.  REHMAN,NAJEEB U 06/11/2015, 7:28 AM

## 2015-06-11 NOTE — Progress Notes (Signed)
Awake. Ginger-ale given to drink. Tolerated well.

## 2015-06-11 NOTE — Progress Notes (Signed)
Swallowing without difficulty. 

## 2015-06-11 NOTE — Op Note (Signed)
EGD PROCEDURE REPORT  PATIENT:  Stacy Frost  MR#:  [id number] Birthdate:  1961-10-30, 54 y.o., female Endoscopist:  Dr. Malissa Hippo, MD Referred By:  Dr. Alice Reichert, MD  Procedure Date: 06/11/2015  Procedure:   EGD with ED  Indications:  Patient is 54 year old Caucasian female who presents with recurrent solid food dysphagia frequent nausea and vomiting at least 2-3 times a week. She has history of Candida esophagitis. Her esophagus has been dilated twice in the past with temporary relief of dysphagia. She is also losing weight. She is an OTC NSAIDs.            Informed Consent:  The risks, benefits, alternatives & imponderables which include, but are not limited to, bleeding, infection, perforation, drug reaction and potential missed lesion have been reviewed.  The potential for biopsy, lesion removal, esophageal dilation, etc. have also been discussed.  Questions have been answered.  All parties agreeable.  Please see history & physical in medical record for more information.  Medications:  Monitored anesthesia care. Please see anesthesia records for details Cetacaine spray topically for oropharyngeal anesthesia  Description of procedure:  The endoscope was introduced through the mouth and advanced to the second portion of the duodenum without difficulty or limitations. The mucosal surfaces were surveyed very carefully during advancement of the scope and upon withdrawal.  Findings:  Esophagus:  Mucosa of the esophagus was normal. Single triangular ulcer noted at GE junction about 5 mm in maximal diameter. Edema and erythema noted to gastric mucosa at Z line. GEJ:  39 cm Stomach:  Stomach was empty and distended very well with insufflation. Folds in the proximal stomach were normal. Examination of mucosa at gastric body and antrum was normal. Single small antral diverticulum noted. Pyloric channel was patent. Annularis was unremarkable. Small area of scarring and erythema noted just  below the cardia. Duodenum:  Normal bulbar and post bulbar mucosa.  Therapeutic/Diagnostic Maneuvers Performed:   Esophagus was dilated by passing 54 and 56 [place] Maloney dilator. No mucosal disruption noted. Esophagus was further dilated by passing 58 [place] Maloney dilator and once again no mucosal disruption noted. Multiple esophageal biopsies taken and endoscope was withdrawn.  Complications: None  Impression: Single small ulcer at GE junction without ring or stricture formation. Esophagus dilated by passing 54, 56 and 58 French Maloney dilators but no mucosal disruption noted. Multiple biopsies from mucosa esophageal body. Healing Mallory-Weiss tear. Small antral diverticulum.  Recommendations:  Standard instructions given. Patient advised to keep NSAID use to minimum. Pantoprazole 40 mg by mouth before breakfast and evening meal daily. I will be contacting patient with biopsy results   REHMAN,NAJEEB U  06/11/2015  8:24 AM  CC: Dr. Alice Reichert, MD & Dr. Bonnetta Barry ref. provider found

## 2015-06-11 NOTE — Discharge Instructions (Signed)
Resume usual medications. Keep naproxen use to minimum. Anti-reflux measures. Pantoprazole 40 mg by mouth 30 minutes before breakfast and evening meal daily. No driving for 24 hours. Physician will call with biopsy results. Please keep symptom diary for 1 month. Office visit in one month.  Esophagogastroduodenoscopy Care After Refer to this sheet in the next few weeks. These instructions provide you with information on caring for yourself after your procedure. Your caregiver may also give you more specific instructions. Your treatment has been planned according to current medical practices, but problems sometimes occur. Call your caregiver if you have any problems or questions after your procedure.  HOME CARE INSTRUCTIONS  Do not eat or drink anything until the numbing medicine (local anesthetic) has worn off and your gag reflex has returned. You will know that the local anesthetic has worn off when you can swallow comfortably.  Do not drive for 12 hours after the procedure or as directed by your caregiver.  Only take medicines as directed by your caregiver. SEEK MEDICAL CARE IF:   You cannot stop coughing.  You are not urinating at all or less than usual. SEEK IMMEDIATE MEDICAL CARE IF:  You have difficulty swallowing.  You cannot eat or drink.  You have worsening throat or chest pain.  You have dizziness, lightheadedness, or you faint.  You have nausea or vomiting.  You have chills.  You have a fever.  You have severe abdominal pain.  You have black, tarry, or bloody stools. Document Released: 10/02/2012 Document Reviewed: 10/02/2012 [facility] Patient Information 2015 [place], [place]. This information is not intended to replace advice given to you by your health care provider. Make sure you discuss any questions you have with your health care provider.

## 2015-06-11 NOTE — Transfer of Care (Signed)
Immediate Anesthesia Transfer of Care Note  Patient: Stacy Frost  Procedure(s) Performed: Procedure(s) with comments: ESOPHAGOGASTRODUODENOSCOPY (EGD) WITH PROPOFOL (N/A) - 730 ESOPHAGEAL DILATION (N/A) [name] 54/56/58  Patient Location: PACU  Anesthesia Type:MAC  Level of Consciousness: awake and patient cooperative  Airway & Oxygen Therapy: Patient Spontanous Breathing and Patient connected to nasal cannula oxygen  Post-op Assessment: Report given to RN, Post -op Vital signs reviewed and stable and Patient moving all extremities  Post vital signs: Reviewed and stable  Last Vitals:  Filed Vitals:   06/11/15 0740  BP: 121/63  Temp:   Resp: 16    Complications: No apparent anesthesia complications

## 2015-06-11 NOTE — Anesthesia Postprocedure Evaluation (Signed)
  Anesthesia Post-op Note  Patient: Stacy Frost  Procedure(s) Performed: Procedure(s) with comments: ESOPHAGOGASTRODUODENOSCOPY (EGD) WITH PROPOFOL (N/A) - 730 ESOPHAGEAL DILATION (N/A) [name] 54/56/58  Patient Location: PACU  Anesthesia Type:MAC  Level of Consciousness: awake, alert , oriented and patient cooperative  Airway and Oxygen Therapy: Patient Spontanous Breathing  Post-op Pain: none  Post-op Assessment: Post-op Vital signs reviewed, Patient's Cardiovascular Status Stable, Respiratory Function Stable, Patent Airway, No signs of Nausea or vomiting and Pain level controlled              Post-op Vital Signs: Reviewed and stable  Last Vitals:  Filed Vitals:   06/11/15 0740  BP: 121/63  Temp:   Resp: 16    Complications: No apparent anesthesia complications

## 2015-06-11 NOTE — Anesthesia Preprocedure Evaluation (Signed)
Anesthesia Evaluation  Patient identified by MRN, date of birth, ID band Patient awake    Reviewed: Allergy & Precautions, NPO status , Patient's Chart, lab work & pertinent test results  Airway Mallampati: II  TM Distance: >3 FB     Dental  (+) Partial Lower, Partial Upper   Pulmonary neg pulmonary ROS,          Cardiovascular hypertension, Rhythm:Regular Rate:Normal     Neuro/Psych  Headaches, PSYCHIATRIC DISORDERS Anxiety    GI/Hepatic GERD-  ,  Endo/Other  diabetes, Well Controlled, Type 2, Oral Hypoglycemic Agents  Renal/GU      Musculoskeletal   Abdominal   Peds  Hematology   Anesthesia Other Findings   Reproductive/Obstetrics                             Anesthesia Physical Anesthesia Plan  ASA: III  Anesthesia Plan: MAC   Post-op Pain Management:    Induction: Intravenous  Airway Management Planned: Simple Face Mask  Additional Equipment:   Intra-op Plan:   Post-operative Plan: Extubation in OR  Informed Consent: I have reviewed the patients History and Physical, chart, labs and discussed the procedure including the risks, benefits and alternatives for the proposed anesthesia with the patient or authorized representative who has indicated his/her understanding and acceptance.     Plan Discussed with:   Anesthesia Plan Comments:         Anesthesia Quick Evaluation

## 2015-06-14 ENCOUNTER — Encounter (HOSPITAL_COMMUNITY): Payer: Self-pay | Admitting: Internal Medicine

## 2015-06-16 ENCOUNTER — Telehealth (INDEPENDENT_AMBULATORY_CARE_PROVIDER_SITE_OTHER): Payer: Self-pay | Admitting: *Deleted

## 2015-06-16 DIAGNOSIS — R1084 Generalized abdominal pain: Secondary | ICD-10-CM

## 2015-06-16 DIAGNOSIS — R110 Nausea: Secondary | ICD-10-CM

## 2015-06-16 NOTE — Telephone Encounter (Signed)
Per Dr.Rehman the patient will need to have labs drawn. 

## 2015-06-17 ENCOUNTER — Emergency Department (HOSPITAL_COMMUNITY)
Admission: EM | Admit: 2015-06-17 | Discharge: 2015-06-17 | Disposition: A | Discharge status: Home / Self Care | Payer: PRIVATE HEALTH INSURANCE | Attending: Emergency Medicine | Admitting: Emergency Medicine

## 2015-06-17 ENCOUNTER — Encounter (HOSPITAL_COMMUNITY): Payer: Self-pay | Admitting: Emergency Medicine

## 2015-06-17 DIAGNOSIS — E119 Type 2 diabetes mellitus without complications: Secondary | ICD-10-CM | POA: Insufficient documentation

## 2015-06-17 DIAGNOSIS — Z79899 Other long term (current) drug therapy: Secondary | ICD-10-CM | POA: Diagnosis not present

## 2015-06-17 DIAGNOSIS — Z872 Personal history of diseases of the skin and subcutaneous tissue: Secondary | ICD-10-CM | POA: Insufficient documentation

## 2015-06-17 DIAGNOSIS — R011 Cardiac murmur, unspecified: Secondary | ICD-10-CM | POA: Diagnosis not present

## 2015-06-17 DIAGNOSIS — K5900 Constipation, unspecified: Secondary | ICD-10-CM | POA: Diagnosis not present

## 2015-06-17 DIAGNOSIS — R1084 Generalized abdominal pain: Secondary | ICD-10-CM | POA: Diagnosis not present

## 2015-06-17 DIAGNOSIS — Z7982 Long term (current) use of aspirin: Secondary | ICD-10-CM | POA: Diagnosis not present

## 2015-06-17 DIAGNOSIS — F419 Anxiety disorder, unspecified: Secondary | ICD-10-CM | POA: Diagnosis not present

## 2015-06-17 DIAGNOSIS — E669 Obesity, unspecified: Secondary | ICD-10-CM | POA: Insufficient documentation

## 2015-06-17 DIAGNOSIS — R110 Nausea: Secondary | ICD-10-CM | POA: Diagnosis present

## 2015-06-17 DIAGNOSIS — E785 Hyperlipidemia, unspecified: Secondary | ICD-10-CM | POA: Diagnosis not present

## 2015-06-17 DIAGNOSIS — Z8669 Personal history of other diseases of the nervous system and sense organs: Secondary | ICD-10-CM | POA: Diagnosis not present

## 2015-06-17 DIAGNOSIS — I10 Essential (primary) hypertension: Secondary | ICD-10-CM | POA: Diagnosis not present

## 2015-06-17 LAB — HEPATIC FUNCTION PANEL
ALT: 39 U/L — AB (ref 6–29)
AST: 30 U/L (ref 10–35)
Albumin: 4.8 g/dL (ref 3.6–5.1)
Alkaline Phosphatase: 91 U/L (ref 33–130)
BILIRUBIN DIRECT: 0.3 mg/dL — AB (ref <=0.2)
BILIRUBIN INDIRECT: 1 mg/dL (ref 0.2–1.2)
Total Bilirubin: 1.3 mg/dL — ABNORMAL HIGH (ref 0.2–1.2)
Total Protein: 8.2 g/dL — ABNORMAL HIGH (ref 6.1–8.1)

## 2015-06-17 LAB — AMYLASE: Amylase: 34 U/L (ref 0–105)

## 2015-06-17 LAB — LIPASE: LIPASE: 38 U/L (ref 7–60)

## 2015-06-17 MED ORDER — DIPHENHYDRAMINE HCL 50 MG/ML IJ SOLN
50.0000 mg | Freq: Once | INTRAMUSCULAR | Status: AC
  Administered 2015-06-17: 50 mg via INTRAVENOUS
  Filled 2015-06-17: qty 1

## 2015-06-17 MED ORDER — METOCLOPRAMIDE HCL 5 MG/ML IJ SOLN
10.0000 mg | Freq: Once | INTRAMUSCULAR | Status: AC
  Administered 2015-06-17: 10 mg via INTRAVENOUS
  Filled 2015-06-17: qty 2

## 2015-06-17 MED ORDER — SODIUM CHLORIDE 0.9 % IV BOLUS (SEPSIS)
1000.0000 mL | Freq: Once | INTRAVENOUS | Status: AC
  Administered 2015-06-17: 1000 mL via INTRAVENOUS

## 2015-06-17 MED ORDER — DEXAMETHASONE SODIUM PHOSPHATE 10 MG/ML IJ SOLN
10.0000 mg | Freq: Once | INTRAMUSCULAR | Status: AC
  Administered 2015-06-17: 10 mg via INTRAVENOUS
  Filled 2015-06-17: qty 1

## 2015-06-17 NOTE — ED Notes (Signed)
Pt states that she had endoscopy by Dr Karilyn Cota on Friday with esophageal dilation and today ate cantaloupe and has developed severe nausea with abdominal pain since eating this.

## 2015-06-17 NOTE — ED Notes (Signed)
Pt alert & oriented x4, stable gait. Patient given discharge instructions, paperwork & prescription(s). Patient  instructed to stop at the registration desk to finish any additional paperwork. Patient verbalized understanding. Pt left department w/ no further questions. 

## 2015-06-17 NOTE — ED Notes (Signed)
MD Mesner at bedside updating patient and family. 

## 2015-06-17 NOTE — ED Provider Notes (Signed)
CSN: [id number]     Arrival date & time 06/17/15  1636 History   First MD Initiated Contact with Patient 06/17/15 1654     Chief Complaint  Patient presents with  . Nausea     (Consider location/radiation/quality/duration/timing/severity/associated sxs/prior Treatment) Patient is a 54 y.o. female presenting with abdominal pain.  Abdominal Pain Pain location:  Generalized Pain quality: aching and sharp   Pain radiates to:  Does not radiate Pain severity:  Mild Onset quality:  Gradual Duration:  1 day Timing:  Intermittent Progression:  Waxing and waning Chronicity:  Recurrent Context: not alcohol use, not diet changes, not previous surgeries, not recent illness and not trauma   Relieved by:  None tried Worsened by:  Nothing tried Ineffective treatments:  None tried Associated symptoms: constipation and nausea   Associated symptoms: no anorexia, no chills, no cough, no fever, no hematuria, no shortness of breath, no vaginal bleeding, no vaginal discharge and no vomiting     Past Medical History  Diagnosis Date  . Restless leg syndrome   . Skin tag   . Hyperlipidemia   . Obesity   . Pain in joint, ankle and foot     Chronic  . Bicuspid aortic valve     Very mild stenosis 12/12  . Migraine headache   . Essential hypertension, benign   . Type 2 diabetes mellitus   . Anxiety   . History of cardiac catheterization     Normal coronaries 2008  . Palpitations     Documented PACs by previous monitoring  . Heart murmur    Past Surgical History  Procedure Laterality Date  . Cholecystectomy  2001    "Poor EF at 17%"  . Umbilical hernia repair  2003  . Achilles tendon repair  2003    Left  . Cardiac catheterization  2008    Normal coronary arteries; normal LV systolic function; mild dilation of aortic root; mild dilation of proximal ascending aorta; likely bicuspid aortic valve  . Colonoscopy N/A 07/17/2014    Procedure: COLONOSCOPY;  Surgeon: Malissa Hippo, MD;   Location: [facility];  Service: Endoscopy;  Laterality: N/A;  105  . Esophagogastroduodenoscopy N/A 07/17/2014    Procedure: ESOPHAGOGASTRODUODENOSCOPY (EGD);  Surgeon: Malissa Hippo, MD;  Location: [facility];  Service: Endoscopy;  Laterality: N/A;  [name] dilation N/A 07/17/2014    Procedure: [name] DILATION;  Surgeon: Malissa Hippo, MD;  Location: [facility];  Service: Endoscopy;  Laterality: N/A;  . Knee surgery    . Esophagogastroduodenoscopy (egd) with propofol N/A 06/11/2015    Procedure: ESOPHAGOGASTRODUODENOSCOPY (EGD) WITH PROPOFOL;  Surgeon: Malissa Hippo, MD;  Location: [facility];  Service: Endoscopy;  Laterality: N/A;  730  . Esophageal dilation N/A 06/11/2015    Procedure: ESOPHAGEAL DILATION;  Surgeon: Malissa Hippo, MD;  Location: [facility];  Service: Endoscopy;  Laterality: N/AElease Hashimoto 54/56/58   Family History  Problem Relation Age of Onset  . Depression Mother   . Cancer Mother     Lung mets (unsure as to what kind)  . Hypertension Father   . Depression Father   . Heart attack Father   . Depression Daughter   . Anxiety disorder Daughter   . Stroke Daughter    Social History  Substance Use Topics  . Smoking status: Never Smoker   . Smokeless tobacco: Never Used  . Alcohol Use: No   OB History    No data available  Review of Systems  Constitutional: Negative for fever and chills.  Respiratory: Negative for cough and shortness of breath.   Gastrointestinal: Positive for nausea, abdominal pain, constipation and abdominal distention. Negative for vomiting, blood in stool and anorexia.  Genitourinary: Negative for hematuria, vaginal bleeding and vaginal discharge.  All other systems reviewed and are negative.     Allergies  Amlodipine besylate; Morphine and related; Amitriptyline; Butorphanol tartrate; Compazine; and Sumatriptan  Home Medications   Prior to Admission medications   Medication Sig Start Date End Date Taking? Authorizing  Provider  acetaminophen-codeine (TYLENOL #3) 300-30 MG per tablet Take 1 tablet by mouth every 4 (four) hours as needed. For pain 09/14/14   Historical Provider, MD  ALPRAZolam Prudy Feeler) 0.5 MG tablet Take 1 tablet by mouth 3 (three) times daily as needed. For anxiety 09/12/14   Historical Provider, MD  aspirin EC 81 MG tablet Take 81 mg by mouth every morning.    Historical Provider, MD  canagliflozin (INVOKANA) 100 MG TABS tablet Take 100 mg by mouth daily.    Historical Provider, MD  furosemide (LASIX) 20 MG tablet Take 40 mg by mouth every morning.    Historical Provider, MD  Liraglutide (VICTOZA Barnett) Inject 1.8 mg into the skin daily.    Historical Provider, MD  lisinopril (PRINIVIL,ZESTRIL) 20 MG tablet Take 20 mg by mouth daily.      Historical Provider, MD  metoprolol (TOPROL-XL) 200 MG 24 hr tablet Take 200 mg by mouth daily.      Historical Provider, MD  naproxen sodium (ANAPROX) 220 MG tablet Take 440 mg by mouth daily as needed (pain).    Historical Provider, MD  ondansetron (ZOFRAN) 4 MG tablet Take 4 mg by mouth as needed for nausea or vomiting.    Historical Provider, MD  pantoprazole (PROTONIX) 40 MG tablet Take 1 tablet (40 mg total) by mouth 2 (two) times daily before a meal. 06/11/15   Malissa Hippo, MD  polyethylene glycol (MIRALAX / GLYCOLAX) packet Take 17 g by mouth once.    Historical Provider, MD  potassium chloride SA (K-DUR,KLOR-CON) 20 MEQ tablet Take 20 mEq by mouth 2 (two) times daily.    Historical Provider, MD  Probiotic Product (FLORAJEN3 PO) Take 1 capsule by mouth daily.    Historical Provider, MD  simvastatin (ZOCOR) 40 MG tablet Take 40 mg by mouth daily.    Historical Provider, MD   BP 139/43 mmHg  Pulse 82  Temp(Src) 97.8 F (36.6 C) (Oral)  Resp 16  Ht  (1.676 m)  Wt 192 lb (87.091 kg)  BMI 31.00 kg/m2  SpO2 100% Physical Exam  Constitutional: She is oriented to person, place, and time. She appears well-developed and well-nourished.  HENT:   Head: Normocephalic and atraumatic.  Eyes: Conjunctivae and EOM are normal. Right eye exhibits no discharge. Left eye exhibits no discharge.  Cardiovascular: Normal rate and regular rhythm.   Pulmonary/Chest: Effort normal and breath sounds normal. No respiratory distress.  Abdominal: Soft. She exhibits no distension. There is tenderness (llq). There is no rebound.  Musculoskeletal: Normal range of motion. She exhibits no edema or tenderness.  Neurological: She is alert and oriented to person, place, and time.  Skin: Skin is warm and dry.  Nursing note and vitals reviewed.   ED Course  Procedures (including critical care time) Labs Review Labs Reviewed - No data to display  Imaging Review No results found. I have personally reviewed and evaluated these images and lab results as  part of my medical decision-making.   EKG Interpretation None      MDM   Final diagnoses:  None   Abdominal migraine v diverticulitis as cause of chronic pain. Will try migraine cocktail first and if not cleared then will get labs and ct scan. Patient with total resolution of of her abdominal pain with migraine cocktail. It able tolerate food and drink by mouth without difficulty. I review of her hepatic function panel, amylase and lipase from her primary office is nothing of high concern on those. I don't feel the patient is labs this time. She will continue following up with her gastroenterologist for further workup of her chronic abdominal pain.  I have personally and contemperaneously reviewed labs and imaging and used in my decision making as above.   A medical screening exam was performed and I feel the patient has had an appropriate workup for their chief complaint at this time and likelihood of emergent condition existing is low. They have been counseled on decision, discharge, follow up and which symptoms necessitate immediate return to the emergency department. They or their family verbally stated  understanding and agreement with plan and discharged in stable condition. \     Marily Memos, MD 06/17/15 [phone]

## 2015-06-17 NOTE — Telephone Encounter (Signed)
Patient has called and states that she ate cantaloupe for lunch. She states that she became and is still extremely nauseous, abdominal pain, sweaty. Patient is on her way to have lab work done that Dr.Rehman has requested. I have requested that the lab run these as STAT. Dr.Rehman is going to be made aware. Per Dr. Karilyn Cota with the patient's symptoms she should go to the ED for further evaluation.

## 2015-06-17 NOTE — Telephone Encounter (Signed)
Patient presented to our office after having lab work done, which was requested to be done stat. On a scale of 1-10 her pain was a 12 , per the patient. Patient stated that she felt that she was going to vomit and or pass out. Assisted to exam room. BP was 140/80 , pulse was 78. Patient was clammy to touch. She stated that she had not had a BM for 4 days until today. It was a large stool and she noted no blood. Patient states that she has been taking Miralax daily. Stomach per patient is swollen,extremely bloated.  Patient's husband called. Pain had subsided, then symptoms started all over again. Patient was sent to the ED ,accompanied by her husband. Labs are in progress, Hepatic Profile, Amylase, Lipase.

## 2015-06-17 NOTE — ED Notes (Signed)
Pt states nausea & pain are both better at present. Pt say she eat crackers & drink, has some nausea but much improved.

## 2015-06-21 ENCOUNTER — Encounter (INDEPENDENT_AMBULATORY_CARE_PROVIDER_SITE_OTHER): Payer: Self-pay | Admitting: *Deleted

## 2015-06-21 ENCOUNTER — Other Ambulatory Visit (INDEPENDENT_AMBULATORY_CARE_PROVIDER_SITE_OTHER): Payer: Self-pay | Admitting: *Deleted

## 2015-06-21 ENCOUNTER — Telehealth (INDEPENDENT_AMBULATORY_CARE_PROVIDER_SITE_OTHER): Payer: Self-pay | Admitting: *Deleted

## 2015-06-21 DIAGNOSIS — R1084 Generalized abdominal pain: Secondary | ICD-10-CM

## 2015-06-21 DIAGNOSIS — R110 Nausea: Secondary | ICD-10-CM

## 2015-06-21 NOTE — Telephone Encounter (Signed)
I have discussed with Stacy Frost -  She is requesting that we first precede with a complete Abdominal Ultrasound.

## 2015-06-21 NOTE — Telephone Encounter (Signed)
She is still having abd pain.  Wants to know what she needs to do--proceed with Ct?

## 2015-06-21 NOTE — Telephone Encounter (Signed)
[place] sch'd 06/22/15 at 745, patient aware

## 2015-06-21 NOTE — Progress Notes (Signed)
This encounter was created in error - please disregard.

## 2015-06-22 ENCOUNTER — Encounter (INDEPENDENT_AMBULATORY_CARE_PROVIDER_SITE_OTHER): Payer: Self-pay | Admitting: *Deleted

## 2015-06-22 ENCOUNTER — Ambulatory Visit (HOSPITAL_COMMUNITY)
Admission: RE | Admit: 2015-06-22 | Discharge: 2015-06-22 | Disposition: A | Discharge status: Home / Self Care | Payer: PRIVATE HEALTH INSURANCE | Source: Ambulatory Visit | Attending: Internal Medicine | Admitting: Internal Medicine

## 2015-06-22 DIAGNOSIS — Z9049 Acquired absence of other specified parts of digestive tract: Secondary | ICD-10-CM | POA: Diagnosis not present

## 2015-06-22 DIAGNOSIS — R1084 Generalized abdominal pain: Secondary | ICD-10-CM

## 2015-06-22 DIAGNOSIS — K760 Fatty (change of) liver, not elsewhere classified: Secondary | ICD-10-CM | POA: Diagnosis not present

## 2015-06-22 DIAGNOSIS — R110 Nausea: Secondary | ICD-10-CM

## 2015-06-23 ENCOUNTER — Telehealth (INDEPENDENT_AMBULATORY_CARE_PROVIDER_SITE_OTHER): Payer: Self-pay | Admitting: *Deleted

## 2015-06-23 ENCOUNTER — Other Ambulatory Visit: Payer: PRIVATE HEALTH INSURANCE

## 2015-06-23 NOTE — Telephone Encounter (Signed)
Per Dr.Rehman we may precede with an abdominal /pelvic CT with contrast if her creat. is okay. Forwarded to [place] for PA. Patient has not been made aware.

## 2015-06-23 NOTE — Telephone Encounter (Signed)
Terri - please review and advise next step. Thank you.

## 2015-06-23 NOTE — Telephone Encounter (Signed)
Patient had left voice mail on results and what to do next.  Please ask Terri/Dr. Karilyn Cota if she needs to proceed with CT or what is next step.

## 2015-06-24 ENCOUNTER — Other Ambulatory Visit (HOSPITAL_COMMUNITY): Payer: PRIVATE HEALTH INSURANCE

## 2015-06-24 ENCOUNTER — Other Ambulatory Visit (INDEPENDENT_AMBULATORY_CARE_PROVIDER_SITE_OTHER): Payer: Self-pay | Admitting: *Deleted

## 2015-06-24 DIAGNOSIS — R109 Unspecified abdominal pain: Secondary | ICD-10-CM

## 2015-06-24 DIAGNOSIS — R112 Nausea with vomiting, unspecified: Secondary | ICD-10-CM

## 2015-06-24 NOTE — Addendum Note (Signed)
Addended by: Shona Needles on: 06/24/2015 09:08 AM   Modules accepted: Orders

## 2015-06-24 NOTE — Telephone Encounter (Signed)
CT sch'd 06/25/15, patient aware

## 2015-06-25 ENCOUNTER — Telehealth (INDEPENDENT_AMBULATORY_CARE_PROVIDER_SITE_OTHER): Payer: Self-pay | Admitting: *Deleted

## 2015-06-25 ENCOUNTER — Ambulatory Visit (HOSPITAL_COMMUNITY)
Admission: RE | Admit: 2015-06-25 | Discharge: 2015-06-25 | Disposition: A | Discharge status: Home / Self Care | Payer: PRIVATE HEALTH INSURANCE | Source: Ambulatory Visit | Attending: Internal Medicine | Admitting: Internal Medicine

## 2015-06-25 DIAGNOSIS — R1030 Lower abdominal pain, unspecified: Secondary | ICD-10-CM | POA: Diagnosis present

## 2015-06-25 DIAGNOSIS — R109 Unspecified abdominal pain: Secondary | ICD-10-CM

## 2015-06-25 DIAGNOSIS — K5900 Constipation, unspecified: Secondary | ICD-10-CM | POA: Diagnosis not present

## 2015-06-25 DIAGNOSIS — R634 Abnormal weight loss: Secondary | ICD-10-CM | POA: Diagnosis not present

## 2015-06-25 DIAGNOSIS — R112 Nausea with vomiting, unspecified: Secondary | ICD-10-CM

## 2015-06-25 MED ORDER — IOHEXOL 300 MG/ML  SOLN
100.0000 mL | Freq: Once | INTRAMUSCULAR | Status: AC | PRN
  Administered 2015-06-25: 100 mL via INTRAVENOUS

## 2015-06-25 NOTE — Telephone Encounter (Signed)
Per Dr.Rehman's instruction - patient was called,message was left. CT showed no cancer , it did show constipation throughout the colon. Patient is to take 1 capful of Miralax daily. Dr.Rehman will call the patient over the weekend to talk about the results.

## 2015-06-28 ENCOUNTER — Other Ambulatory Visit (INDEPENDENT_AMBULATORY_CARE_PROVIDER_SITE_OTHER): Payer: Self-pay | Admitting: Internal Medicine

## 2015-06-28 DIAGNOSIS — R112 Nausea with vomiting, unspecified: Secondary | ICD-10-CM

## 2015-06-28 DIAGNOSIS — R109 Unspecified abdominal pain: Secondary | ICD-10-CM

## 2015-07-01 ENCOUNTER — Encounter (HOSPITAL_COMMUNITY): Payer: PRIVATE HEALTH INSURANCE

## 2015-07-09 ENCOUNTER — Telehealth (INDEPENDENT_AMBULATORY_CARE_PROVIDER_SITE_OTHER): Payer: Self-pay | Admitting: *Deleted

## 2015-07-09 NOTE — Telephone Encounter (Signed)
Left message for patient to call me to reschedule GES that was sch'd for 07/01/15 -- patient had to cancel due to fever and being sick.

## 2015-12-09 ENCOUNTER — Encounter: Payer: Self-pay | Admitting: Cardiology

## 2015-12-09 ENCOUNTER — Ambulatory Visit (INDEPENDENT_AMBULATORY_CARE_PROVIDER_SITE_OTHER): Payer: PRIVATE HEALTH INSURANCE | Admitting: Cardiology

## 2015-12-09 VITALS — BP 102/80 | HR 73 | Ht 65.0 in | Wt 199.0 lb

## 2015-12-09 DIAGNOSIS — Q231 Congenital insufficiency of aortic valve: Secondary | ICD-10-CM

## 2015-12-09 DIAGNOSIS — I10 Essential (primary) hypertension: Secondary | ICD-10-CM | POA: Diagnosis not present

## 2015-12-09 DIAGNOSIS — I350 Nonrheumatic aortic (valve) stenosis: Secondary | ICD-10-CM | POA: Diagnosis not present

## 2015-12-09 DIAGNOSIS — Z01810 Encounter for preprocedural cardiovascular examination: Secondary | ICD-10-CM

## 2015-12-09 DIAGNOSIS — I491 Atrial premature depolarization: Secondary | ICD-10-CM

## 2015-12-09 NOTE — Patient Instructions (Signed)

## 2015-12-09 NOTE — Progress Notes (Signed)
Cardiology Office Note  Date: 12/09/2015   ID: Stacy Frost, DOB May 25, 1961, MRN [id number]  PCP: Milana Obey, MD  Primary Cardiologist: Nona Dell, MD   Chief Complaint  Patient presents with  . Preoperative evaluation  . Bicuspid aortic valve    History of Present Illness: Stacy Frost is a 55 y.o. female last seen in August 2016. She presents for a preoperative evaluation with plans to undergo arthroscopic knee surgery with Dr. Cleophas Dunker. She states she has a torn meniscus, occurred when she was adjusting some blinds in her house and twisted her leg.  From a cardiac perspective she does not report any exertional chest pain or progressive shortness of breath, currently NYHA class II. She does have intermittent palpitations, although this is been a fairly long-standing problem with prior documentation of PACs by cardiac monitoring. ECG today is reviewed and shows sinus rhythm with borderline low voltage and poor R-wave progression which is old.  She had a follow-up echocardiogram in August of last year which revealed normal LVEF with mild diastolic dysfunction, bicuspid aortic valve as noted previously, and mild to moderate aortic stenosis. She remains asymptomatic with this.  He has been under a lot of stress, has a daughter in her 30s who had a stroke and is in an assisted-living facility.  I reviewed her medications which are outlined below. She continued on aspirin, Lasix, lisinopril, Toprol-XL, potassium supplements, and Zocor.  Past Medical History  Diagnosis Date  . Restless leg syndrome   . Skin tag   . Hyperlipidemia   . Obesity   . Pain in joint, ankle and foot     Chronic  . Bicuspid aortic valve   . Migraine headache   . Essential hypertension, benign   . Type 2 diabetes mellitus (HCC)   . Anxiety   . History of cardiac catheterization     Normal coronaries 2008  . Palpitations     Documented PACs by previous monitoring  . Aortic stenosis     Mild to  moderate    Past Surgical History  Procedure Laterality Date  . Cholecystectomy  2001    "Poor EF at 17%"  . Umbilical hernia repair  2003  . Achilles tendon repair  2003    Left  . Cardiac catheterization  2008    Normal coronary arteries; normal LV systolic function; mild dilation of aortic root; mild dilation of proximal ascending aorta; likely bicuspid aortic valve  . Colonoscopy N/A 07/17/2014    Procedure: COLONOSCOPY;  Surgeon: Malissa Hippo, MD;  [facility];  Service: Endoscopy;  Laterality: N/A;  105  . Esophagogastroduodenoscopy N/A 07/17/2014    Procedure: ESOPHAGOGASTRODUODENOSCOPY (EGD);  Surgeon: Malissa Hippo, MD;  [facility];  Service: Endoscopy;  Laterality: N/A;  [name] dilation N/A 07/17/2014    Procedure: [name] DILATION;  Surgeon: Malissa Hippo, MD;  [facility];  Service: Endoscopy;  Laterality: N/A;  . Knee surgery    . Esophagogastroduodenoscopy (egd) with propofol N/A 06/11/2015    Procedure: ESOPHAGOGASTRODUODENOSCOPY (EGD) WITH PROPOFOL;  Surgeon: Malissa Hippo, MD;  [facility]: [facility];  Service: Endoscopy;  Laterality: N/A;  730  . Esophageal dilation N/A 06/11/2015    Procedure: ESOPHAGEAL DILATION;  Surgeon: Malissa Hippo, MD;  Location: [facility];  Service: Endoscopy;  Laterality: N/AElease Hashimoto 54/56/58  . Hernia repair      Current Outpatient Prescriptions  Medication Sig Dispense Refill  . acetaminophen-codeine (TYLENOL #3) 300-30  MG per tablet Take 1 tablet by mouth every 4 (four) hours as needed. For pain  0  . ALPRAZolam (XANAX) 0.5 MG tablet Take 1 tablet by mouth 3 (three) times daily as needed. For anxiety  0  . aspirin EC 81 MG tablet Take 81 mg by mouth every morning.    . canagliflozin (INVOKANA) 100 MG TABS tablet Take 100 mg by mouth daily.    . furosemide (LASIX) 20 MG tablet Take 40 mg by mouth every morning.    . Liraglutide (VICTOZA [place]) Inject 1.8 mg into the skin daily.    Marland Kitchen lisinopril  (PRINIVIL,ZESTRIL) 20 MG tablet Take 40 mg by mouth daily.     . metoprolol (TOPROL-XL) 200 MG 24 hr tablet Take 200 mg by mouth daily.      . ondansetron (ZOFRAN) 4 MG tablet Take 4 mg by mouth as needed for nausea or vomiting.    . polyethylene glycol (MIRALAX / GLYCOLAX) packet Take 17 g by mouth daily.     . potassium chloride SA (K-DUR,KLOR-CON) 20 MEQ tablet Take 20 mEq by mouth daily.     . Probiotic Product (FLORAJEN3 PO) Take 1 capsule by mouth daily.    . simvastatin (ZOCOR) 40 MG tablet Take 40 mg by mouth daily.     No current facility-administered medications for this visit.   Allergies:  Amlodipine besylate; Morphine and related; Amitriptyline; Butorphanol tartrate; Compazine; and Sumatriptan   Social History: The patient  reports that she has never smoked. She has never used smokeless tobacco. She reports that she does not drink alcohol or use illicit drugs.   ROS:  Please see the history of present illness. Otherwise, complete review of systems is positive for knee pain.  All other systems are reviewed and negative.   Physical Exam: VS:  BP 102/80 mmHg  Pulse 73  Ht 5\' 5"  (1.651 m)  Wt 199 lb (90.266 kg)  BMI 33.12 kg/m2  SpO2 95%, BMI Body mass index is 33.12 kg/(m^2).  Wt Readings from Last 3 Encounters:  12/09/15 199 lb (90.266 kg)  06/17/15 192 lb (87.091 kg)  06/03/15 194 lb (87.998 kg)    Patient in no acute distress. HEENT: Conjunctiva and lids normal, oropharynx clear. Neck: Supple, no elevated JVP or carotid bruits, no thyromegaly. Lungs: Clear to auscultation, nonlabored breathing at rest. Cardiac: Regular rate and rhythm, no S3, 2/6 basal systolic murmur, no pericardial rub. Abdomen: Soft, nontender, bowel sounds present, no guarding or rebound. Extremities: No pitting edema, distal pulses 2+. Skin: Warm and dry. Musculoskeletal: No kyphosis. Neuropsychiatric: Alert and oriented x3, affect grossly appropriate.  ECG: I personally reviewed the prior  tracing from 09/15/2014 which showed sinus rhythm with poor anterior R-wave progression and nonspecific ST changes.  Recent Labwork: 06/09/2015: BUN 21*; Creatinine, Ser 0.83; Hemoglobin 13.4; Platelets 275; Potassium 4.2; Sodium 138 06/16/2015: ALT 39*; AST 30   Other Studies Reviewed Today:  Echocardiogram 06/08/2015: Study Conclusions  - Left ventricle: The cavity size was normal. Wall thickness was increased in a pattern of mild LVH. Systolic function was normal. The estimated ejection fraction was in the range of 60% to 65%. Wall motion was normal; there were no regional wall motion abnormalities. Doppler parameters are consistent with abnormal left ventricular relaxation (grade 1 diastolic dysfunction). - Aortic valve: Mildly to moderately calcified annulus. Probably bicuspid; mildly thickened, mild to moderately calcified leaflets. There was mild to moderate stenosis. There was trivial regurgitation. Peak velocity (S): 238 cm/s. Mean gradient (S): 11  mm Hg. Valve area (VTI): 1 cm^2. Valve area (Vmax): 1.06 cm^2. Valve area (Vmean): 1.14 cm^2. - Mitral valve: There was mild regurgitation.  Assessment and Plan:  1. Preoperative evaluation in a 55 year old woman with bicuspid aortic valve assisted with mild to moderate asymptomatic aortic stenosis, previously document normal coronary arteries without angina, hypertension that is well controlled today, and history of intermittent palpitations with previously documented PACs. Follow-up ECG is stable. She has had no change in functional capacity. No further cardiac testing is indicated at this time, and she should be able to proceed with arthroscopic knee surgery at an acceptable perioperative cardiac risk.  2. Bicuspid aortic valve with mild to moderate aortic stenosis. Echocardiogram from August 2016 as outlined above. Will plan to see her back in one year for a clinical visit.  3. History of palpitations with  previously document PACs, no specific arrhythmias. She continues on beta blocker therapy.  4. Essential hypertension, blood pressure is well controlled today.  Current medicines were reviewed with the patient today.   Orders Placed This Encounter  Procedures  . EKG 12-Lead    Disposition: FU with me in 1 year.   Signed, Jonelle Sidle, MD, [facility] 12/09/2015 11:10 AM    [facility] at [facility] [address] [place], [place], [place] [address] Phone: [phone]; Fax: [phone]

## 2016-01-07 ENCOUNTER — Telehealth: Payer: Self-pay | Admitting: *Deleted

## 2016-01-07 DIAGNOSIS — R072 Precordial pain: Secondary | ICD-10-CM

## 2016-01-07 DIAGNOSIS — Z01810 Encounter for preprocedural cardiovascular examination: Secondary | ICD-10-CM

## 2016-01-07 NOTE — Telephone Encounter (Signed)
Patient called office stating that on four different occasions she had chest pain rated 8-9/10 and used nitroglycerin x's 3 before getting relief that was associated with sob and sweating. Patient did not c/o active chest pain. Patient advised that with the symptoms that she is describing that she needed to go to the ED for an evaluation. Patient said that if her symptoms returned, that she would go to the ED for an evaluation.

## 2016-01-11 ENCOUNTER — Encounter: Payer: Self-pay | Admitting: *Deleted

## 2016-01-11 NOTE — Telephone Encounter (Signed)
Stacy SidleSamuel G McDowell, MD  Eustace MooreLydia M Aimee Timmons, LPN           She did not mention these symptoms to me when I saw her recently in February. If she is truly having recurrent chest pain since that time that has required nitroglycerin, she will have to have further testing before having surgery. She has a history of normal coronary arteries as of 2008, but no recent ischemic testing. Would recommend a Lexiscan Cardiolite, and she will have to defer any surgery until this is complete.

## 2016-01-11 NOTE — Telephone Encounter (Addendum)
Patient informed and verbalized understanding of plan. Instructions for Lexiscan Cardiolite read to patient while on phone and also copy placed in mychart for patient reference. A copy of this phone notes has been sent to surgeon's office Dr. Cleophas DunkerWhitfield.

## 2016-01-11 NOTE — Telephone Encounter (Signed)
Patient is waiting on call back on reference to what to do about surgery.  Wanted to know if she needs to be seen again

## 2016-01-11 NOTE — Addendum Note (Signed)
Addended by: Eustace MooreANDERSON, Lyndell Allaire M on: 01/11/2016 04:09 PM   Modules accepted: Orders

## 2016-01-13 ENCOUNTER — Encounter: Payer: Self-pay | Admitting: Cardiology

## 2016-01-21 ENCOUNTER — Encounter (HOSPITAL_COMMUNITY)
Admission: RE | Admit: 2016-01-21 | Discharge: 2016-01-21 | Disposition: A | Discharge status: Home / Self Care | Payer: PRIVATE HEALTH INSURANCE | Source: Ambulatory Visit | Attending: Cardiology | Admitting: Cardiology

## 2016-01-21 ENCOUNTER — Encounter (HOSPITAL_COMMUNITY): Payer: Self-pay

## 2016-01-21 ENCOUNTER — Ambulatory Visit (HOSPITAL_COMMUNITY): Payer: PRIVATE HEALTH INSURANCE

## 2016-01-21 ENCOUNTER — Inpatient Hospital Stay (HOSPITAL_COMMUNITY): Admission: RE | Admit: 2016-01-21 | Payer: PRIVATE HEALTH INSURANCE | Source: Ambulatory Visit

## 2016-01-21 DIAGNOSIS — R072 Precordial pain: Secondary | ICD-10-CM | POA: Diagnosis present

## 2016-01-21 DIAGNOSIS — Z01810 Encounter for preprocedural cardiovascular examination: Secondary | ICD-10-CM | POA: Insufficient documentation

## 2016-01-21 LAB — NM MYOCAR MULTI W/SPECT W/WALL MOTION / EF
CHL CUP NUCLEAR SSS: 2
CSEPPHR: 86 {beats}/min
LV dias vol: 68 mL (ref 46–106)
LVSYSVOL: 18 mL
NUC STRESS TID: 1.89
RATE: 0.22
Rest HR: 69 {beats}/min
SDS: 0
SRS: 2

## 2016-01-21 MED ORDER — TECHNETIUM TC 99M SESTAMIBI GENERIC - CARDIOLITE
10.0000 | Freq: Once | INTRAVENOUS | Status: AC | PRN
  Administered 2016-01-21: 10 via INTRAVENOUS

## 2016-01-21 MED ORDER — SODIUM CHLORIDE 0.9% FLUSH
INTRAVENOUS | Status: AC
  Administered 2016-01-21: 10 mL via INTRAVENOUS
  Filled 2016-01-21: qty 10

## 2016-01-21 MED ORDER — REGADENOSON 0.4 MG/5ML IV SOLN
INTRAVENOUS | Status: AC
  Administered 2016-01-21: 0.4 mg via INTRAVENOUS
  Filled 2016-01-21: qty 5

## 2016-01-21 MED ORDER — TECHNETIUM TC 99M SESTAMIBI - CARDIOLITE
30.0000 | Freq: Once | INTRAVENOUS | Status: AC | PRN
  Administered 2016-01-21: 30 via INTRAVENOUS

## 2016-01-24 ENCOUNTER — Encounter: Payer: Self-pay | Admitting: *Deleted

## 2016-01-25 ENCOUNTER — Telehealth: Payer: Self-pay | Admitting: *Deleted

## 2016-01-25 NOTE — Telephone Encounter (Signed)
-----   Message from Eustace MooreLydia M Sydnei Ohaver, LPN sent at 4/54/09813/27/2017 10:43 AM EDT ----- Patient informed via mychart message and copy sent to Dr. Cleophas DunkerWhitfield.

## 2016-01-25 NOTE — Telephone Encounter (Signed)
-----   Message from Skylor Hughson M Susumu Hackler, LPN sent at 01/24/2016 10:43 AM EDT ----- Patient informed via mychart message and copy sent to Dr. Whitfield. 

## 2016-01-25 NOTE — Telephone Encounter (Signed)
Patient informed. 

## 2016-07-06 ENCOUNTER — Telehealth: Payer: Self-pay | Admitting: Cardiology

## 2016-07-06 NOTE — Telephone Encounter (Signed)
Mrs. Stacy Frost called to set up appointment with Dr. Diona BrownerMcDowell. She wanted to know if she needs echo before appointment. She states that she was told she needs to have an echo yearly. Please call Cell # [phone] and you can leave a message.

## 2016-07-06 NOTE — Telephone Encounter (Signed)
LOV did not indicate that pt needed Echo prior to appt. Pt aware and confirmed 9/26 f/u.

## 2016-07-24 ENCOUNTER — Encounter: Payer: Self-pay | Admitting: *Deleted

## 2016-07-25 ENCOUNTER — Ambulatory Visit: Payer: PRIVATE HEALTH INSURANCE | Admitting: Cardiology

## 2016-08-01 NOTE — Progress Notes (Signed)
Cardiology Office Note  Date: 08/02/2016   ID: Stacy Frost, DOB [birth year], MRN [id number]  PCP: Milana Obey, MD  Primary Cardiologist: Nona Dell, MD   Chief Complaint  Patient presents with  . Aortic Stenosis    History of Present Illness: Stacy Frost is a 55 y.o. female last seen in February. She presents for a routine follow-up visit. Reports no increasing dyspnea on exertion, has had limitations related to right knee pain, has been using a brace and follows with orthopedics.  She states that she had some episodes of chest pain a few weeks ago, although these have resolved. We did update stress testing earlier this year with a low risk Myoview in March. I reviewed her ECG today which shows sinus rhythm with decreased R wave progression and PVC.  Last echocardiogram was in August 2016 showing mild to moderate aortic stenosis. We continue to follow her on examination at this time, no significant changes.  Medications reviewed, she continues on beta blocker with history of palpitations and previously document PACs. No dizziness or syncope.  Past Medical History:  Diagnosis Date  . Anxiety   . Aortic stenosis    Mild to moderate  . Bicuspid aortic valve   . Essential hypertension, benign   . History of cardiac catheterization    Normal coronaries 2008  . Hyperlipidemia   . Migraine headache   . Obesity   . Pain in joint, ankle and foot    Chronic  . Palpitations    Documented PACs by previous monitoring  . Restless leg syndrome   . Skin tag   . Type 2 diabetes mellitus (HCC)     Past Surgical History:  Procedure Laterality Date  . ACHILLES TENDON REPAIR  2003   Left  . CARDIAC CATHETERIZATION  2008   Normal coronary arteries; normal LV systolic function; mild dilation of aortic root; mild dilation of proximal ascending aorta; likely bicuspid aortic valve  . CHOLECYSTECTOMY  2001   "Poor EF at 17%"  . COLONOSCOPY N/A 07/17/2014   Procedure: COLONOSCOPY;   Surgeon: Malissa Hippo, MD;  [facility];  Service: Endoscopy;  Laterality: N/A;  105  . ESOPHAGEAL DILATION N/A 06/11/2015   Procedure: ESOPHAGEAL DILATION;  Surgeon: Malissa Hippo, MD;  Location: [facility];  Service: Endoscopy;  Laterality: N/AElease Stacy Frost 54/56/58  . ESOPHAGOGASTRODUODENOSCOPY N/A 07/17/2014   Procedure: ESOPHAGOGASTRODUODENOSCOPY (EGD);  Surgeon: Malissa Hippo, MD;  [facility];  Service: Endoscopy;  Laterality: N/A;  . ESOPHAGOGASTRODUODENOSCOPY (EGD) WITH PROPOFOL N/A 06/11/2015   Procedure: ESOPHAGOGASTRODUODENOSCOPY (EGD) WITH PROPOFOL;  Surgeon: Malissa Hippo, MD;  Location: [facility];  Service: Endoscopy;  Laterality: N/A;  730  . HERNIA REPAIR    . KNEE SURGERY    . MALONEY DILATION N/A 07/17/2014   Procedure: Elease Stacy Frost DILATION;  Surgeon: Malissa Hippo, MD;  [facility];  Service: Endoscopy;  Laterality: N/A;  . UMBILICAL HERNIA REPAIR  2003    Current Outpatient Prescriptions  Medication Sig Dispense Refill  . acetaminophen-codeine (TYLENOL #3) 300-30 MG per tablet Take 1 tablet by mouth every 4 (four) hours as needed. For pain  0  . ALPRAZolam (XANAX) 0.5 MG tablet Take 1 tablet by mouth 3 (three) times daily as needed. For anxiety  0  . aspirin EC 81 MG tablet Take 81 mg by mouth every morning.    . canagliflozin (INVOKANA) 100 MG TABS tablet Take 100 mg by mouth daily.    Marland Kitchen  furosemide (LASIX) 20 MG tablet Take 40 mg by mouth every morning.    . Liraglutide (VICTOZA [place]) Inject 1.8 mg into the skin daily.    Marland Kitchen. lisinopril (PRINIVIL,ZESTRIL) 20 MG tablet Take 40 mg by mouth daily.     . metoprolol (TOPROL-XL) 200 MG 24 hr tablet Take 200 mg by mouth daily.      . ondansetron (ZOFRAN) 4 MG tablet Take 4 mg by mouth as needed for nausea or vomiting.    . polyethylene glycol (MIRALAX / GLYCOLAX) packet Take 17 g by mouth daily.     . potassium chloride SA (K-DUR,KLOR-CON) 20 MEQ tablet Take 20 mEq by mouth daily.     . Probiotic  Product (FLORAJEN3 PO) Take 1 capsule by mouth daily.    . simvastatin (ZOCOR) 40 MG tablet Take 40 mg by mouth daily.     No current facility-administered medications for this visit.    Allergies:  Amlodipine besylate; Morphine and related; Amitriptyline; Butorphanol tartrate; Compazine [prochlorperazine edisylate]; and Sumatriptan   Social History: The patient  reports that she has never smoked. She has never used smokeless tobacco. She reports that she does not drink alcohol or use drugs.   ROS:  Please see the history of present illness. Otherwise, complete review of systems is positive for none.  All other systems are reviewed and negative.   Physical Exam: VS:  BP 116/72   Pulse 83   Ht 5\' 5"  (1.651 m)   Wt 208 lb (94.3 kg)   SpO2 97%   BMI 34.61 kg/m , BMI Body mass index is 34.61 kg/m.  Wt Readings from Last 3 Encounters:  08/02/16 208 lb (94.3 kg)  12/09/15 199 lb (90.3 kg)  06/17/15 192 lb (87.1 kg)    Patient in no acute distress. HEENT: Conjunctiva and lids normal, oropharynx clear. Neck: Supple, no elevated JVP or carotid bruits, no thyromegaly. Lungs: Clear to auscultation, nonlabored breathing at rest. Cardiac: Regular rate and rhythm, no S3, 2/6 basal systolic murmur, no pericardial rub. Abdomen: Soft, nontender, bowel sounds present, no guarding or rebound. Extremities: No pitting edema, distal pulses 2+. Wearing a brace on right knee. Skin: Warm and dry. Musculoskeletal: No kyphosis. Neuropsychiatric: Alert and oriented x3, affect grossly appropriate.  ECG: I personally reviewed the tracing from 12/09/2015 which showed sinus rhythm with decreased R wave progression.  Recent Labwork:  06/09/2015: BUN 21*; Creatinine, Ser 0.83; Hemoglobin 13.4; Platelets 275; Potassium 4.2; Sodium 138 06/16/2015: ALT 39*; AST 30   Other Studies Reviewed Today:  Echocardiogram 06/08/2015: Study Conclusions  - Left ventricle: The cavity size was normal. Wall thickness was    increased in a pattern of mild LVH. Systolic function was normal.   The estimated ejection fraction was in the range of 60% to 65%.   Wall motion was normal; there were no regional wall motion   abnormalities. Doppler parameters are consistent with abnormal   left ventricular relaxation (grade 1 diastolic dysfunction). - Aortic valve: Mildly to moderately calcified annulus. Probably   bicuspid; mildly thickened, mild to moderately calcified   leaflets. There was mild to moderate stenosis. There was trivial   regurgitation. Peak velocity (S): 238 cm/s. Mean gradient (S): 11   mm Hg. Valve area (VTI): 1 cm^2. Valve area (Vmax): 1.06 cm^2.   Valve area (Vmean): 1.14 cm^2. - Mitral valve: There was mild regurgitation.  Lexiscan Myoview 01/21/2016:  There was no ST segment deviation noted during stress.  The study is normal. No perfusion defects  The left ventricular ejection fraction is hyperdynamic (>65%).  This is a low risk study.  Assessment and Plan:  1. Bicuspid aortic valve with mild to moderate aortic stenosis, stable on examination with no change in functional capacity from a cardiac perspective. Will plan a follow-up echocardiogram next year.  2. Intermittent chest pain, ECG shows no change and she had a low risk Myoview done back in March. Continue observation.  3. History of palpitations and PACs, clinically stable on beta blocker.  4. Essential hypertension, continues on lisinopril and Toprol-XL. Blood pressure is normal today.  Current medicines were reviewed with the patient today.  Disposition: Follow-up in one year with echocardiogram.  Signed, Jonelle Sidle, MD, [facility] 08/02/2016 2:17 PM    [facility] at [facility] [address] [place], [place] [address] Phone: [phone]; Fax: [phone]

## 2016-08-02 ENCOUNTER — Ambulatory Visit (INDEPENDENT_AMBULATORY_CARE_PROVIDER_SITE_OTHER): Payer: PRIVATE HEALTH INSURANCE | Admitting: Cardiology

## 2016-08-02 ENCOUNTER — Encounter: Payer: Self-pay | Admitting: Cardiology

## 2016-08-02 VITALS — BP 116/72 | HR 83 | Ht 65.0 in | Wt 208.0 lb

## 2016-08-02 DIAGNOSIS — R072 Precordial pain: Secondary | ICD-10-CM

## 2016-08-02 DIAGNOSIS — I350 Nonrheumatic aortic (valve) stenosis: Secondary | ICD-10-CM

## 2016-08-02 DIAGNOSIS — I10 Essential (primary) hypertension: Secondary | ICD-10-CM | POA: Diagnosis not present

## 2016-08-02 DIAGNOSIS — I491 Atrial premature depolarization: Secondary | ICD-10-CM

## 2016-08-02 NOTE — Patient Instructions (Signed)
Medication Instructions:  Your physician recommends that you continue on your current medications as directed. Please refer to the Current Medication list given to you today.   Labwork: NONE  Testing/Procedures: Your physician has requested that you have an echocardiogram. Echocardiography is a painless test that uses sound waves to create images of your heart. It provides your doctor with information about the size and shape of your heart and how well your heart's chambers and valves are working. This procedure takes approximately one hour. There are no restrictions for this procedure.    Follow-Up: Your physician wants you to follow-up in: 1 YEAR.  You will receive a reminder letter in the mail two months in advance. If you don't receive a letter, please call our office to schedule the follow-up appointment.   Any Other Special Instructions Will Be Listed Below (If Applicable).     If you need a refill on your cardiac medications before your next appointment, please call your pharmacy.   

## 2016-08-03 NOTE — Addendum Note (Signed)
Addended by: Marlyn CorporalARLTON, Faizah Kandler A on: 08/03/2016 01:12 PM   Modules accepted: Orders

## 2016-09-06 ENCOUNTER — Ambulatory Visit (INDEPENDENT_AMBULATORY_CARE_PROVIDER_SITE_OTHER): Payer: Self-pay | Admitting: Orthopaedic Surgery

## 2016-09-06 ENCOUNTER — Ambulatory Visit (INDEPENDENT_AMBULATORY_CARE_PROVIDER_SITE_OTHER): Payer: PRIVATE HEALTH INSURANCE | Admitting: Orthopaedic Surgery

## 2016-09-06 ENCOUNTER — Encounter (INDEPENDENT_AMBULATORY_CARE_PROVIDER_SITE_OTHER): Payer: Self-pay | Admitting: Orthopaedic Surgery

## 2016-09-06 DIAGNOSIS — M179 Osteoarthritis of knee, unspecified: Secondary | ICD-10-CM | POA: Insufficient documentation

## 2016-09-06 DIAGNOSIS — M1711 Unilateral primary osteoarthritis, right knee: Secondary | ICD-10-CM

## 2016-09-06 DIAGNOSIS — M1710 Unilateral primary osteoarthritis, unspecified knee: Secondary | ICD-10-CM | POA: Insufficient documentation

## 2016-09-06 HISTORY — DX: Unilateral primary osteoarthritis, right knee: M17.11

## 2016-09-06 MED ORDER — METHYLPREDNISOLONE ACETATE 40 MG/ML IJ SUSP
80.0000 mg | INTRAMUSCULAR | Status: AC | PRN
  Administered 2016-09-06: 80 mg

## 2016-09-06 MED ORDER — LIDOCAINE HCL 1 % IJ SOLN
5.0000 mL | INTRAMUSCULAR | Status: AC | PRN
  Administered 2016-09-06: 5 mL

## 2016-09-06 NOTE — Progress Notes (Signed)
Office Visit Note   Patient: Stacy SallesLisa Frost           Date of Birth: 12-22-1960           MRN: [id number] Visit Date: 09/06/2016              Requested by: Gareth MorganSteve Knowlton, MD [address] [place], [place] [address] PCP: Milana ObeyStephen D Knowlton, MD   Assessment & Plan: Visit Diagnoses:  1. Unilateral primary osteoarthritis, right knee    Primary right knee osteoarthritis  Plan: Plan to inject the right knee and have her follow-up as needed. We have discussed the potential benefit from the injection and possible need for Visco supplementation  Follow-Up Instructions: Return if symptoms worsen or fail to improve.   Orders:  Orders Placed This Encounter  Procedures  . Large Joint Injection/Arthrocentesis   No orders of the defined types were placed in this encounter.     Procedures: Large Joint Inj Date/Time: 09/06/2016 10:26 AM Performed by: Valeria BatmanWHITFIELD, Obi Scrima W Authorized by: Valeria BatmanWHITFIELD, Aeva Posey W   Consent Given by:  Patient Timeout: prior to procedure the correct patient, procedure, and site was verified   Indications:  Pain and joint swelling Location:  Knee Site:  R knee Prep: patient was prepped and draped in usual sterile fashion   Needle Size:  25 G Needle Length:  1.5 inches Approach:  Anteromedial Ultrasound Guidance: No   Fluoroscopic Guidance: No   Arthrogram: No Medications:  5 mL lidocaine 1 %; 80 mg methylPREDNISolone acetate 40 MG/ML Aspiration Attempted: No   Patient tolerance:  Patient tolerated the procedure well with no immediate complications     Clinical Data: No additional findings.   Subjective: Chief Complaint  Patient presents with  . Left Knee - Pain, Weakness, Edema    Recurring Right knee pain. Pt has fallen 3x in the last 3 months. All three times she fell directly on knees. Sometimes when walking she feels the left knee gives way and pain will shoot up to her left hip.  Pt uses heating pad every night.     Review of Systems    Constitutional: Negative.   HENT: Negative.   Eyes: Negative.   Respiratory: Negative.   Cardiovascular: Negative.   Gastrointestinal: Negative.   Endocrine: Negative.   Genitourinary: Negative.   Musculoskeletal: Negative.   Skin: Negative.   Neurological: Negative.   Hematological: Negative.   Psychiatric/Behavioral: Negative.      Objective: Vital Signs: BP 112/68   Pulse 87   Resp 12   Ht 5\' 6"  (1.676 m)   Wt 198 lb (89.8 kg)   BMI 31.96 kg/m   Physical Exam  Ortho Exam Mrs. right knee had a small effusion. Her knee was not hot or red. Had predominantly medial joint pain. 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 She denied any groin pain or back discomfort. There was no lower extremity swelling or calf discomfort.  Specialty Comments:  No specialty comments available.  Imaging: No results found.   PMFS History: Patient Active Problem List   Diagnosis Date Noted  . Unilateral primary osteoarthritis, right knee 09/06/2016  . Dysphagia 06/23/2014  . Nausea 06/23/2014  . Weight loss 06/23/2014  . Abdominal pain, chronic, epigastric 09/05/2013  . Preoperative evaluation to rule out surgical contraindication 07/12/2012  . HYPERLIPIDEMIA 06/29/2008  . Essential hypertension, benign 06/01/2008  .  Bicuspid aortic valve 06/01/2008   Past Medical History:  Diagnosis Date  . Anxiety   . Aortic stenosis    Mild to moderate  . Bicuspid aortic valve   . Essential hypertension, benign   . History of cardiac catheterization    Normal coronaries 2008  . Hyperlipidemia   . Migraine headache   . Obesity   . Pain in joint, ankle and foot    Chronic  . Palpitations    Documented PACs by previous monitoring  . Restless leg syndrome   . Skin tag   . Type 2 diabetes mellitus (HCC)   . Unilateral primary osteoarthritis, right knee  09/06/2016    Family History  Problem Relation Age of Onset  . Depression Mother   . Cancer Mother     Lung mets (unsure as to what kind)  . Hypertension Father   . Depression Father   . Heart attack Father   . Depression Daughter   . Anxiety disorder Daughter   . Stroke Daughter     Past Surgical History:  Procedure Laterality Date  . ACHILLES TENDON REPAIR  2003   Left  . CARDIAC CATHETERIZATION  2008   Normal coronary arteries; normal LV systolic function; mild dilation of aortic root; mild dilation of proximal ascending aorta; likely bicuspid aortic valve  . CHOLECYSTECTOMY  2001   "Poor EF at 17%"  . COLONOSCOPY N/A 07/17/2014   Procedure: COLONOSCOPY;  Surgeon: Malissa HippoNajeeb U Rehman, MD;  [facility];  Service: Endoscopy;  Laterality: N/A;  105  . ESOPHAGEAL DILATION N/A 06/11/2015   Procedure: ESOPHAGEAL DILATION;  Surgeon: Malissa HippoNajeeb U Rehman, MD;  Location: [facility];  Service: Endoscopy;  Laterality: N/AElease Hashimoto;  Maloney 54/56/58  . ESOPHAGOGASTRODUODENOSCOPY N/A 07/17/2014   Procedure: ESOPHAGOGASTRODUODENOSCOPY (EGD);  Surgeon: Malissa HippoNajeeb U Rehman, MD;  [facility];  Service: Endoscopy;  Laterality: N/A;  . ESOPHAGOGASTRODUODENOSCOPY (EGD) WITH PROPOFOL N/A 06/11/2015   Procedure: ESOPHAGOGASTRODUODENOSCOPY (EGD) WITH PROPOFOL;  Surgeon: Malissa HippoNajeeb U Rehman, MD;  Location: [facility];  Service: Endoscopy;  Laterality: N/A;  730  . HERNIA REPAIR    . KNEE SURGERY    . MALONEY DILATION N/A 07/17/2014   Procedure: Elease HashimotoMALONEY DILATION;  Surgeon: Malissa HippoNajeeb U Rehman, MD;  [facility];  Service: Endoscopy;  Laterality: N/A;  . UMBILICAL HERNIA REPAIR  2003   Social History   Occupational History  . Home maker    Social History Main Topics  . Smoking status: Never Smoker  . Smokeless tobacco: Never Used  . Alcohol use No  . Drug use: No  . Sexual activity: Not on file

## 2016-09-11 ENCOUNTER — Other Ambulatory Visit (INDEPENDENT_AMBULATORY_CARE_PROVIDER_SITE_OTHER): Payer: Self-pay | Admitting: Orthopaedic Surgery

## 2016-10-10 ENCOUNTER — Ambulatory Visit (INDEPENDENT_AMBULATORY_CARE_PROVIDER_SITE_OTHER): Payer: PRIVATE HEALTH INSURANCE | Admitting: Internal Medicine

## 2017-01-17 ENCOUNTER — Encounter (INDEPENDENT_AMBULATORY_CARE_PROVIDER_SITE_OTHER): Payer: Self-pay | Admitting: Orthopaedic Surgery

## 2017-01-17 ENCOUNTER — Ambulatory Visit (INDEPENDENT_AMBULATORY_CARE_PROVIDER_SITE_OTHER): Payer: PRIVATE HEALTH INSURANCE | Admitting: Orthopaedic Surgery

## 2017-01-17 VITALS — BP 113/64 | HR 87 | Resp 14 | Ht 64.0 in | Wt 200.0 lb

## 2017-01-17 DIAGNOSIS — M25561 Pain in right knee: Secondary | ICD-10-CM | POA: Diagnosis not present

## 2017-01-17 DIAGNOSIS — G8929 Other chronic pain: Secondary | ICD-10-CM

## 2017-01-17 MED ORDER — LIDOCAINE HCL 1 % IJ SOLN
5.0000 mL | INTRAMUSCULAR | Status: AC | PRN
Start: 2017-01-17 — End: 2017-01-17
  Administered 2017-01-17: 5 mL

## 2017-01-17 MED ORDER — BUPIVACAINE HCL 0.5 % IJ SOLN
3.0000 mL | INTRAMUSCULAR | Status: AC | PRN
  Administered 2017-01-17: 3 mL via INTRA_ARTICULAR

## 2017-01-17 MED ORDER — METHYLPREDNISOLONE ACETATE 40 MG/ML IJ SUSP
80.0000 mg | INTRAMUSCULAR | Status: AC | PRN
  Administered 2017-01-17: 80 mg

## 2017-01-17 NOTE — Progress Notes (Signed)
Office Visit Note   Patient: Stacy Frost           Date of Birth: 1961-04-07           MRN: [id number] Visit Date: 01/17/2017              Requested by: Gareth Morgan, MD [address] 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 Pennsaid gel as a trial  Follow-Up Instructions: No Follow-up on file.   Orders:  No orders of the defined types were placed in this encounter.  No orders of the defined types were placed in this encounter.     Procedures: Large Joint Inj Date/Time: 01/17/2017 9:27 AM Performed by: [name] Authorized by: [name]   Consent Given by:  Patient Timeout: prior to procedure the correct patient, procedure, and site was verified   Indications:  Pain and joint swelling Location:  Knee Site:  R knee Prep: patient was prepped and draped in usual sterile fashion   Needle Size:  25 G Needle Length:  1.5 inches Approach:  Anteromedial Ultrasound Guidance: No   Fluoroscopic Guidance: No   Arthrogram: No   Medications:  5 mL lidocaine 1 %; 80 mg methylPREDNISolone acetate 40 MG/ML; 3 mL bupivacaine 0.5 % Aspiration Attempted: No   Patient tolerance:  Patient tolerated the procedure well with no immediate complications     Clinical Data: No additional findings.   Subjective: Chief Complaint  Patient presents with  . Right Knee - Pain, Injections    Stacy Frost is a 56 year old female with chronic right knee pain. She takes Tylenol #3 for her chronic migraines but she reports it does not help her knee.   [name] was last seen in October for a cortisone injection of the right knee. She notes that it made a difference for at least 3-4 months on to have  the pain recur. On occasion she's had sensation of her knee giving way. She's had a prior right knee arthroscopy with evidence of possible anterior cruciate ligament instability and arthritis. Surgery was performed on 02/03/2016 included a chondroplasty of the patellofemoral joint and partial medial meniscectomy. areas of grade 2 chondromalacia on the patella and grade 3 on the trochlear. Grade 2 changes of the medial compartment were identified. She's had a problem with migraine headaches since she was a teenager and is on Tylenol No. 3 area and occasionally has to take that at night for her knee  Review of Systems   Objective: Vital Signs: Resp 14   Ht 5\' 4"  (1.626 m)   Wt 200 lb (90.7 kg)   BMI 34.33 kg/m   Physical Exam  Ortho Exam right knee with minimal effusion. The knee was not hot warm or red. No opening with a varus valgus stress. Slight anterior drawer sign consistent with anterior cruciate ligament insufficiency. No calf pain. No ankle swelling. Neurovascular exam intact. Mild patella crepitation. Mild medial joint pain. None laterally.  Specialty Comments:  No specialty comments available.  Imaging: No results found.   PMFS History: Patient Active Problem List   Diagnosis Date Noted  . Unilateral primary osteoarthritis, right knee 09/06/2016  . Dysphagia 06/23/2014  . Nausea 06/23/2014  . Weight loss 06/23/2014  . Abdominal pain, chronic,  epigastric 09/05/2013  . Preoperative evaluation to rule out surgical contraindication 07/12/2012  . HYPERLIPIDEMIA 06/29/2008  . Essential hypertension, benign 06/01/2008  . Bicuspid aortic valve 06/01/2008   Past Medical History:  Diagnosis Date  . Anxiety   . Aortic stenosis    Mild to moderate  . Bicuspid aortic valve   . Essential hypertension, benign   . History of cardiac catheterization    Normal coronaries 2008  . Hyperlipidemia   . Migraine headache   . Obesity   . Pain in joint, ankle and foot    Chronic  .  Palpitations    Documented PACs by previous monitoring  . Restless leg syndrome   . Skin tag   . Type 2 diabetes mellitus (HCC)   . Unilateral primary osteoarthritis, right knee 09/06/2016    Family History  Problem Relation Age of Onset  . Depression Mother   . Cancer Mother     Lung mets (unsure as to what kind)  . Hypertension Father   . Depression Father   . Heart attack Father   . Depression Daughter   . Anxiety disorder Daughter   . Stroke Daughter     Past Surgical History:  Procedure Laterality Date  . ACHILLES TENDON REPAIR  2003   Left  . CARDIAC CATHETERIZATION  2008   Normal coronary arteries; normal LV systolic function; mild dilation of aortic root; mild dilation of proximal ascending aorta; likely bicuspid aortic valve  . CHOLECYSTECTOMY  2001   "Poor EF at 17%"  . COLONOSCOPY N/A 07/17/2014   Procedure: COLONOSCOPY;  Surgeon: Malissa HippoNajeeb U Rehman, MD;  [facility];  Service: Endoscopy;  Laterality: N/A;  105  . ESOPHAGEAL DILATION N/A 06/11/2015   Procedure: ESOPHAGEAL DILATION;  Surgeon: Malissa HippoNajeeb U Rehman, MD;  Location: [facility];  Service: Endoscopy;  Laterality: N/AElease Hashimoto;  Maloney 54/56/58  . ESOPHAGOGASTRODUODENOSCOPY N/A 07/17/2014   Procedure: ESOPHAGOGASTRODUODENOSCOPY (EGD);  Surgeon: Malissa HippoNajeeb U Rehman, MD;  [facility];  Service: Endoscopy;  Laterality: N/A;  . ESOPHAGOGASTRODUODENOSCOPY (EGD) WITH PROPOFOL N/A 06/11/2015   Procedure: ESOPHAGOGASTRODUODENOSCOPY (EGD) WITH PROPOFOL;  Surgeon: Malissa HippoNajeeb U Rehman, MD;  Location: [facility];  Service: Endoscopy;  Laterality: N/A;  730  . HERNIA REPAIR    . KNEE SURGERY    . MALONEY DILATION N/A 07/17/2014   Procedure: Elease HashimotoMALONEY DILATION;  Surgeon: Malissa HippoNajeeb U Rehman, MD;  [facility];  Service: Endoscopy;  Laterality: N/A;  . UMBILICAL HERNIA REPAIR  2003   Social History   Occupational History  . Home maker    Social History Main Topics  . Smoking status: Never Smoker  . Smokeless tobacco: Never  Used  . Alcohol use No  . Drug use: No  . Sexual activity: Not on file

## 2017-07-04 ENCOUNTER — Ambulatory Visit (INDEPENDENT_AMBULATORY_CARE_PROVIDER_SITE_OTHER): Payer: PRIVATE HEALTH INSURANCE | Admitting: Orthopaedic Surgery

## 2017-07-04 DIAGNOSIS — M1711 Unilateral primary osteoarthritis, right knee: Secondary | ICD-10-CM | POA: Diagnosis not present

## 2017-07-04 MED ORDER — BUPIVACAINE HCL 0.5 % IJ SOLN
3.0000 mL | INTRAMUSCULAR | Status: AC | PRN
  Administered 2017-07-04: 3 mL via INTRA_ARTICULAR

## 2017-07-04 MED ORDER — LIDOCAINE HCL 1 % IJ SOLN
5.0000 mL | INTRAMUSCULAR | Status: AC | PRN
  Administered 2017-07-04: 5 mL

## 2017-07-04 MED ORDER — METHYLPREDNISOLONE ACETATE 40 MG/ML IJ SUSP
80.0000 mg | INTRAMUSCULAR | Status: AC | PRN
  Administered 2017-07-04: 80 mg

## 2017-07-04 NOTE — Progress Notes (Signed)
Office Visit Note   Patient: Stacy SallesLisa Heintzelman           Date of Birth: [birth year]           MRN: [id number] Visit Date: 07/04/2017              Requested by: Gareth MorganKnowlton, Steve, MD [address] [place], [place] [address] PCP: Gareth MorganKnowlton, Steve, MD   Assessment & Plan: Visit Diagnoses:  1. Unilateral primary osteoarthritis, right knee     Plan:cortisone injection right knee. F/u prn  Follow-Up Instructions: Return if symptoms worsen or fail to improve.   Orders:  No orders of the defined types were placed in this encounter.  No orders of the defined types were placed in this encounter.     Procedures: Large Joint Inj Date/Time: 07/04/2017 9:34 AM Performed by: Valeria BatmanWHITFIELD, Ronaldo Crilly W Authorized by: Valeria BatmanWHITFIELD, Arrion Broaddus W   Consent Given by:  Patient Timeout: prior to procedure the correct patient, procedure, and site was verified   Indications:  Pain and joint swelling Location:  Knee Site:  R knee Prep: patient was prepped and draped in usual sterile fashion   Needle Size:  25 G Needle Length:  1.5 inches Approach:  Anteromedial Ultrasound Guidance: No   Fluoroscopic Guidance: No   Arthrogram: No   Medications:  5 mL lidocaine 1 %; 80 mg methylPREDNISolone acetate 40 MG/ML; 3 mL bupivacaine 0.5 % Aspiration Attempted: No   Patient tolerance:  Patient tolerated the procedure well with no immediate complications     Clinical Data: No additional findings.   Subjective: No chief complaint on file. Prior diagnosis of OA right knee. Good response to cortisone in April. Pain along med joint and patella with occasion swelling in knee.No recent injury or trauma  HPI  Review of Systems   Objective: Vital Signs: There were no vitals taken for this visit.  Physical Exam  Ortho Exammild pain along med jt line and patella. No effusion. No distal edema. N/v intact  Specialty Comments:  No specialty comments available.  Imaging: No results found.   PMFS History: Patient  Active Problem List   Diagnosis Date Noted  . Unilateral primary osteoarthritis, right knee 09/06/2016  . Dysphagia 06/23/2014  . Nausea 06/23/2014  . Weight loss 06/23/2014  . Abdominal pain, chronic, epigastric 09/05/2013  . Preoperative evaluation to rule out surgical contraindication 07/12/2012  . HYPERLIPIDEMIA 06/29/2008  . Essential hypertension, benign 06/01/2008  . Bicuspid aortic valve 06/01/2008   Past Medical History:  Diagnosis Date  . Anxiety   . Aortic stenosis    Mild to moderate  . Bicuspid aortic valve   . Essential hypertension, benign   . History of cardiac catheterization    Normal coronaries 2008  . Hyperlipidemia   . Migraine headache   . Obesity   . Pain in joint, ankle and foot    Chronic  . Palpitations    Documented PACs by previous monitoring  . Restless leg syndrome   . Skin tag   . Type 2 diabetes mellitus (HCC)   . Unilateral primary osteoarthritis, right knee 09/06/2016    Family History  Problem Relation Age of Onset  . Depression Mother   . Cancer Mother        Lung mets (unsure as to what kind)  . Hypertension Father   . Depression Father   . Heart attack Father   . Depression Daughter   . Anxiety disorder Daughter   . Stroke Daughter  Past Surgical History:  Procedure Laterality Date  . ACHILLES TENDON REPAIR  2003   Left  . CARDIAC CATHETERIZATION  2008   Normal coronary arteries; normal LV systolic function; mild dilation of aortic root; mild dilation of proximal ascending aorta; likely bicuspid aortic valve  . CHOLECYSTECTOMY  2001   "Poor EF at 17%"  . COLONOSCOPY N/A 07/17/2014   Procedure: COLONOSCOPY;  Surgeon: Malissa Hippo, MD;  [facility];  Service: Endoscopy;  Laterality: N/A;  105  . ESOPHAGEAL DILATION N/A 06/11/2015   Procedure: ESOPHAGEAL DILATION;  Surgeon: Malissa Hippo, MD;  Location: [facility];  Service: Endoscopy;  Laterality: N/AElease Hashimoto 54/56/58  . ESOPHAGOGASTRODUODENOSCOPY N/A  07/17/2014   Procedure: ESOPHAGOGASTRODUODENOSCOPY (EGD);  Surgeon: Malissa Hippo, MD;  [facility];  Service: Endoscopy;  Laterality: N/A;  . ESOPHAGOGASTRODUODENOSCOPY (EGD) WITH PROPOFOL N/A 06/11/2015   Procedure: ESOPHAGOGASTRODUODENOSCOPY (EGD) WITH PROPOFOL;  Surgeon: Malissa Hippo, MD;  Location: [facility];  Service: Endoscopy;  Laterality: N/A;  730  . HERNIA REPAIR    . KNEE SURGERY    . MALONEY DILATION N/A 07/17/2014   Procedure: [name] DILATION;  Surgeon: Malissa Hippo, MD;  [facility];  Service: Endoscopy;  Laterality: N/A;  . UMBILICAL HERNIA REPAIR  2003   Social History   Occupational History  . Home maker    Social History Main Topics  . Smoking status: Never Smoker  . Smokeless tobacco: Never Used  . Alcohol use No  . Drug use: No  . Sexual activity: Not on file     [name], MD   Note - This record has been created using [employer].  Chart creation errors have been sought, but may not always  have been located. Such creation errors do not reflect on  the standard of medical care.

## 2017-11-01 ENCOUNTER — Ambulatory Visit: Payer: PRIVATE HEALTH INSURANCE | Admitting: Neurology

## 2017-11-07 ENCOUNTER — Encounter: Payer: Self-pay | Admitting: Neurology

## 2017-11-07 ENCOUNTER — Ambulatory Visit (INDEPENDENT_AMBULATORY_CARE_PROVIDER_SITE_OTHER): Payer: PRIVATE HEALTH INSURANCE | Admitting: Neurology

## 2017-11-07 VITALS — BP 134/70 | HR 71 | Ht 65.0 in | Wt 188.4 lb

## 2017-11-07 DIAGNOSIS — G43119 Migraine with aura, intractable, without status migrainosus: Secondary | ICD-10-CM | POA: Diagnosis not present

## 2017-11-07 DIAGNOSIS — G43709 Chronic migraine without aura, not intractable, without status migrainosus: Secondary | ICD-10-CM | POA: Diagnosis not present

## 2017-11-07 DIAGNOSIS — IMO0002 Reserved for concepts with insufficient information to code with codable children: Secondary | ICD-10-CM

## 2017-11-07 MED ORDER — FREMANEZUMAB-VFRM 225 MG/1.5ML ~~LOC~~ SOSY: 225.0000 mg | PREFILLED_SYRINGE | Freq: Once | SUBCUTANEOUS | 0 refills | Status: AC

## 2017-11-07 MED ORDER — DICLOFENAC POTASSIUM(MIGRAINE) 50 MG PO PACK: 50.0000 mg | PACK | ORAL | 3 refills | Status: DC

## 2017-11-07 MED ORDER — FREMANEZUMAB-VFRM 225 MG/1.5ML ~~LOC~~ SOSY: 225.0000 mg | PREFILLED_SYRINGE | SUBCUTANEOUS | 11 refills | Status: DC

## 2017-11-07 NOTE — Progress Notes (Signed)
NEUROLOGY CONSULTATION NOTE  Stacy Frost MRN: [id number] DOB: 10/05/1961  Referring provider: Dr. Sherryll Burger Primary care provider: Dr. Sherryll Burger  Reason for consult:  migraine  HISTORY OF PRESENT ILLNESS: Stacy Frost is a 57 year old female with type 2 diabetes mellitus, CKD stage II, anxiety and hypertension who presents for migraines.  History supplemented by PCP note.  Onset:  57 years old Location:  Bilateral temples/behind eyes. Quality:  throbbing Intensity:  severe Aura:  Preceded by snake like shape in either eye, causing blindness for 30-60 minutes and resolves, followed by headache.  In October-November 2018, it was accompanied by conjunctival injection. Prodrome:  yawning Postdrome:  no Associated symptoms:  Nausea, vomiting, photophobia, phonophobia, osmophobia.  She has not had any new worse headache of her life, waking up from sleep Duration:  Usually 1 to 3 days.  Has had status migrainosus requiring hospitalization in the past (not for several years). Frequency:  4 to 5 days a week Frequency of abortive medication: 5 days a week Triggers/exacerbating factors:  stress Relieving factors:  Laying in cool dark quiet room Activity:  aggravates  Past NSAIDS:  Ibuprofen, naproxen Past analgesics:  morphine (nausea, vomiting), Excedrin (nausea) Past abortive triptans:  contraindicated (marked hypertensive response) Past muscle relaxants:  No Past anti-anxiolytic:  Vicodin Past anti-emetic:  Thorazine (adverse reaction) Past antihypertensive medications:  propranolol Past antidepressant medications:  Effexor, amitriptyline Past anticonvulsant medications:  topiramate (photosensitivity), Depakote DR 250mg  twice daily Past vitamins/Herbal/Supplements:  no Past antihistamines/decongestants:  no Other past therapies:  no  Current NSAIDS:  no Current analgesics:  Tylenol #3 Current triptans:  no Current anti-emetic:  Zofran 4mg  Current muscle relaxants:  no Current  anti-anxiolytic:  Xanax Current sleep aide:  no Current Antihypertensive medications:  Toprol XL 200mg , lisinopril, furosemide Current Antidepressant medications:  no Current Anticonvulsant medications:   Current Vitamins/Herbal/Supplements:  potassium chloride Current Antihistamines/Decongestants:  no Other therapy:  no Other medication:  meclizine   Caffeine:  no Alcohol:  no Smoker:  no Diet:  Hydrates.  Skips meals. Exercise:  no Depression/anxiety:  Stress related to relationship with Stacy Frost who had a stroke at age 33 with blood sugar of over 1700.  She resides in assisted living. Sleep hygiene:  poor Family history of headache:  Mom, aunt, grandmother  She had an MRI of the brain with and without contrast on 10/20/11 to evaluate habitual migraine headache with transient aphasia, vertigo and left leg weakness, which was personally reviewed and was normal.  PAST MEDICAL HISTORY: Past Medical History:  Diagnosis Date  . Anxiety   . Aortic stenosis    Mild to moderate  . Bicuspid aortic valve   . Essential hypertension, benign   . History of cardiac catheterization    Normal coronaries 2008  . Hyperlipidemia   . Migraine headache   . Obesity   . Pain in joint, ankle and foot    Chronic  . Palpitations    Documented PACs by previous monitoring  . Restless leg syndrome   . Skin tag   . Type 2 diabetes mellitus (HCC)   . Unilateral primary osteoarthritis, right knee 09/06/2016    PAST SURGICAL HISTORY: Past Surgical History:  Procedure Laterality Date  . ACHILLES TENDON REPAIR  2003   Left  . CARDIAC CATHETERIZATION  2008   Normal coronary arteries; normal LV systolic function; mild dilation of aortic root; mild dilation of proximal ascending aorta; likely bicuspid aortic valve  . CHOLECYSTECTOMY  2001   "Poor EF at  17%"  . COLONOSCOPY N/A 07/17/2014   Procedure: COLONOSCOPY;  Surgeon: Malissa Hippo, MD;  [facility];  Service: Endoscopy;   Laterality: N/A;  105  . ESOPHAGEAL DILATION N/A 06/11/2015   Procedure: ESOPHAGEAL DILATION;  Surgeon: Malissa Hippo, MD;  Location: [facility];  Service: Endoscopy;  Laterality: N/AElease Hashimoto 54/56/58  . ESOPHAGOGASTRODUODENOSCOPY N/A 07/17/2014   Procedure: ESOPHAGOGASTRODUODENOSCOPY (EGD);  Surgeon: Malissa Hippo, MD;  [facility];  Service: Endoscopy;  Laterality: N/A;  . ESOPHAGOGASTRODUODENOSCOPY (EGD) WITH PROPOFOL N/A 06/11/2015   Procedure: ESOPHAGOGASTRODUODENOSCOPY (EGD) WITH PROPOFOL;  Surgeon: Malissa Hippo, MD;  Location: [facility];  Service: Endoscopy;  Laterality: N/A;  730  . HERNIA REPAIR    . KNEE SURGERY    . MALONEY DILATION N/A 07/17/2014   Procedure: [name] DILATION;  Surgeon: Malissa Hippo, MD;  [facility];  Service: Endoscopy;  Laterality: N/A;  . UMBILICAL HERNIA REPAIR  2003    MEDICATIONS: Current Outpatient Medications on File Prior to Visit  Medication Sig Dispense Refill  . acetaminophen-codeine (TYLENOL #3) 300-30 MG per tablet Take 1 tablet by mouth every 4 (four) hours as needed. For pain  0  . ALPRAZolam (XANAX) 0.5 MG tablet Take 1 tablet by mouth 3 (three) times daily as needed. For anxiety  0  . aspirin EC 81 MG tablet Take 81 mg by mouth every morning.    . canagliflozin (INVOKANA) 100 MG TABS tablet Take 100 mg by mouth daily.    . furosemide (LASIX) 20 MG tablet Take 40 mg by mouth every morning.    Marland Kitchen GLOBAL EASE INJECT PEN NEEDLES 31G X 5 MM MISC USE EVERY DAY AS DIRECTED EMERGENCY REFILL FAXED DR  0  . Liraglutide (VICTOZA [place]) Inject 1.8 mg into the skin daily.    Marland Kitchen lisinopril (PRINIVIL,ZESTRIL) 20 MG tablet Take 40 mg by mouth daily.     . metFORMIN (GLUCOPHAGE-XR) 500 MG 24 hr tablet Take 500 mg by mouth daily.  2  . metoprolol (TOPROL-XL) 200 MG 24 hr tablet Take 200 mg by mouth daily.      . ondansetron (ZOFRAN) 4 MG tablet Take 4 mg by mouth as needed for nausea or vomiting.    . polyethylene glycol (MIRALAX /  GLYCOLAX) packet Take 17 g by mouth daily.     . potassium chloride SA (K-DUR,KLOR-CON) 20 MEQ tablet Take 20 mEq by mouth daily.     . Probiotic Product (FLORAJEN3 PO) Take 1 capsule by mouth daily.    . simvastatin (ZOCOR) 40 MG tablet Take 40 mg by mouth daily.     No current facility-administered medications on file prior to visit.     ALLERGIES: Allergies  Allergen Reactions  . Amlodipine Besylate Shortness Of Breath    REACTION: SOB  . Morphine And Related Shortness Of Breath    Chest pain  . Amitriptyline     Unknown reaction  . Butorphanol Tartrate Other (See Comments)    REACTION: ED visit and got for migraine - not sure of response  . Compazine [Prochlorperazine Edisylate]     Altered mental status  . Rocephin [Ceftriaxone Sodium In Dextrose]   . Sumatriptan     REACTION: HTN    FAMILY HISTORY: Family History  Problem Relation Age of Onset  . Depression Mother   . Cancer Mother        Lung mets (unsure as to what kind)  . Diabetes Mother   .  Hypertension Father   . Depression Father   . Heart attack Father   . Depression Stacy Frost   . Anxiety disorder Stacy Frost   . Stroke Stacy Frost   . Transient ischemic attack Brother   . Emphysema Maternal Grandfather   . Brain cancer Paternal Grandmother   . Heart attack Paternal Grandfather     SOCIAL HISTORY: Social History   Socioeconomic History  . Marital status: Married    Spouse name: Harvie Heck  . Number of children: 2  . Years of education: Not on file  . Highest education level: Some college, no degree  Social Needs  . Financial resource strain: Not on file  . Food insecurity - worry: Not on file  . Food insecurity - inability: Not on file  . Transportation needs - medical: Not on file  . Transportation needs - non-medical: Not on file  Occupational History  . Occupation: Home maker  Tobacco Use  . Smoking status: Never Smoker  . Smokeless tobacco: Never Used  Substance and Sexual Activity  . Alcohol  use: No    Alcohol/week: 0.0 oz  . Drug use: No  . Sexual activity: Not on file  Other Topics Concern  . Not on file  Social History Narrative   Married, lives with husband in a one level home. They recently got a puppy. She avoids caffeine, does not exercise.     REVIEW OF SYSTEMS: Constitutional: No fevers, chills, or sweats, no generalized fatigue, change in appetite Eyes: No visual changes, double vision, eye pain Ear, nose and throat: No hearing loss, ear pain, nasal congestion, sore throat Cardiovascular: No chest pain, palpitations Respiratory:  No shortness of breath at rest or with exertion, wheezes GastrointestinaI: No nausea, vomiting, diarrhea, abdominal pain, fecal incontinence Genitourinary:  No dysuria, urinary retention or frequency Musculoskeletal:  No neck pain, back pain Integumentary: No rash, pruritus, skin lesions Neurological: as above Psychiatric: No depression, insomnia, anxiety Endocrine: No palpitations, fatigue, diaphoresis, mood swings, change in appetite, change in weight, increased thirst Hematologic/Lymphatic:  No purpura, petechiae. Allergic/Immunologic: no itchy/runny eyes, nasal congestion, recent allergic reactions, rashes  PHYSICAL EXAM: Vitals:   11/07/17 0957  BP: 134/70  Pulse: 71  SpO2: 97%   General: No acute distress.  Patient appears well-groomed.  Head:  Normocephalic/atraumatic Eyes:  fundi examined but not visualized Neck: supple, no paraspinal tenderness, full range of motion Back: No paraspinal tenderness Heart: regular rate and rhythm Lungs: Clear to auscultation bilaterally. Vascular: No carotid bruits. Neurological Exam: Mental status: alert and oriented to person, place, and time, recent and remote memory intact, fund of knowledge intact, attention and concentration intact, speech fluent and not dysarthric, language intact. Cranial nerves: CN I: not tested CN II: pupils equal, round and reactive to light, visual fields  intact CN III, IV, VI:  full range of motion, no nystagmus, no ptosis CN V: facial sensation intact CN VII: upper and lower face symmetric CN VIII: hearing intact CN IX, X: gag intact, uvula midline CN XI: sternocleidomastoid and trapezius muscles intact CN XII: tongue midline Bulk & Tone: normal, no fasciculations. Motor:  5/5 throughout  Sensation:  temperature and vibration sensation intact. Deep Tendon Reflexes:  2+ throughout, toes downgoing.  Finger to nose testing:  Without dysmetria.  Heel to shin:  Without dysmetria.  Gait:  Slight right limp due to torn ACL.  Able to turn and tandem walk. Romberg negative.  IMPRESSION: Chronic migraine Episode in 2012 likely a complicated migraine.  PLAN: 1.  Will  try one of the new CGRP inhibitors, Ajovy. 2.  Stop Tylenol #3. For abortive therapy, try Cambia 3.  Lifestyle modification:  Exercise, not to skip meals, sleep hygiene, stress management. 4.  Limit pain relievers to no more than 2 days out of week to prevent rebound headache. 5.  Consider supplements:  Mg, B2, CoQ10 6.  Follow up in 3 months.  Thank you for allowing me to take part in the care of this patient.  Shon MilletAdam Chantrice Hagg, DO  CC: Kirstie PeriAshish Shah, MD

## 2017-11-07 NOTE — Patient Instructions (Addendum)
Migraine Recommendations:     1.  We will start Ajovy, which is a new migraine medication.  It is a once a month self-injection. 2.  Stop Tylenol with codeine.  When you get a migraine, try Cambia.  Mix powder in water and drink.  Take as directed.  You cannot repeat dose for 24 hours. 3.  Be aware of common food triggers such as processed sweets, processed foods with nitrites (such as deli meat, hot dogs, sausages), foods with MSG, alcohol (such as wine), chocolate, certain cheeses, certain fruits (dried fruits, bananas, some citrus fruit), vinegar, diet soda. 4.  Avoid caffeine 5.  Routine exercise 6.  Proper sleep hygiene 7.  Stay adequately hydrated with water 8.  Keep a headache diary. 9.  Maintain proper stress management. 10.  Do not skip meals. 11.  Consider supplements:  Magnesium citrate 400mg  to 600mg  daily, riboflavin 400mg , Coenzyme Q 10 100mg  three times daily 12.  Follow up in 3 months.

## 2017-11-23 ENCOUNTER — Ambulatory Visit (INDEPENDENT_AMBULATORY_CARE_PROVIDER_SITE_OTHER): Payer: PRIVATE HEALTH INSURANCE | Admitting: Cardiology

## 2017-11-23 ENCOUNTER — Encounter: Payer: Self-pay | Admitting: Cardiology

## 2017-11-23 ENCOUNTER — Telehealth: Payer: Self-pay

## 2017-11-23 VITALS — BP 120/72 | HR 70 | Ht 65.0 in | Wt 187.2 lb

## 2017-11-23 DIAGNOSIS — I10 Essential (primary) hypertension: Secondary | ICD-10-CM

## 2017-11-23 DIAGNOSIS — E782 Mixed hyperlipidemia: Secondary | ICD-10-CM

## 2017-11-23 DIAGNOSIS — I350 Nonrheumatic aortic (valve) stenosis: Secondary | ICD-10-CM

## 2017-11-23 DIAGNOSIS — R072 Precordial pain: Secondary | ICD-10-CM | POA: Diagnosis not present

## 2017-11-23 DIAGNOSIS — Q231 Congenital insufficiency of aortic valve: Secondary | ICD-10-CM

## 2017-11-23 MED ORDER — NITROGLYCERIN 0.4 MG SL SUBL: 0.4000 mg | SUBLINGUAL_TABLET | SUBLINGUAL | 3 refills | Status: DC | PRN

## 2017-11-23 NOTE — Telephone Encounter (Signed)
Yes

## 2017-11-23 NOTE — Telephone Encounter (Signed)
Patient would like to know if she can have a prescription for nitroglycerin? Forgot to ask at appointment

## 2017-11-23 NOTE — Patient Instructions (Signed)
Medication Instructions:  Your physician recommends that you continue on your current medications as directed. Please refer to the Current Medication list given to you today.  Labwork: NONE  Testing/Procedures: Your physician has requested that you have an echocardiogram. Echocardiography is a painless test that uses sound waves to create images of your heart. It provides your doctor with information about the size and shape of your heart and how well your heart's chambers and valves are working. This procedure takes approximately one hour. There are no restrictions for this procedure.  Your physician has requested that you have a lexiscan myoview. For further information please visit [email or web address]. Please follow instruction sheet, as given.  Follow-Up: Your physician recommends that you schedule a follow-up appointment PENDING TEST RESULTS  Any Other Special Instructions Will Be Listed Below (If Applicable).  If you need a refill on your cardiac medications before your next appointment, please call your pharmacy. 

## 2017-11-23 NOTE — Telephone Encounter (Signed)
RX sent

## 2017-11-23 NOTE — Progress Notes (Signed)
Cardiology Office Note  Date: 11/23/2017   ID: Stacy Frost, DOB Jul 12, 1961, MRN [id number]  PCP: Kirstie Peri, MD  Primary Cardiologist: Nona Dell, MD   Chief Complaint  Patient presents with  . Chest Pain    History of Present Illness: Stacy Frost is a 57 y.o. female last seen in October 2017. She presents today describing a recent episode of chest discomfort that woke her up at nighttime. She states that she sat up, took some nitroglycerin because the symptoms did not resolve. Since then she has felt more fatigued. No palpitations or syncope. Otherwise, she states that she has been in her usual state of health over the last year.  Last echocardiogram was in 2016 demonstrating LVEF 60-65% with mild diastolic function and mild to moderate aortic stenosis in the setting of bicuspid aortic valve.  I personally reviewed her ECG today which shows sinus rhythm with lead motion artifact, decreased R wave progression, and nonspecific ST changes.  We went over her current medications which are outlined below.  Past Medical History:  Diagnosis Date  . Anxiety   . Aortic stenosis    Mild to moderate  . Bicuspid aortic valve   . Essential hypertension, benign   . History of cardiac catheterization    Normal coronaries 2008  . Hyperlipidemia   . Migraine headache   . Obesity   . Pain in joint, ankle and foot    Chronic  . Palpitations    Documented PACs by previous monitoring  . Restless leg syndrome   . Skin tag   . Type 2 diabetes mellitus (HCC)   . Unilateral primary osteoarthritis, right knee 09/06/2016    Past Surgical History:  Procedure Laterality Date  . ACHILLES TENDON REPAIR  2003   Left  . CARDIAC CATHETERIZATION  2008   Normal coronary arteries; normal LV systolic function; mild dilation of aortic root; mild dilation of proximal ascending aorta; likely bicuspid aortic valve  . CHOLECYSTECTOMY  2001   "Poor EF at 17%"  . COLONOSCOPY N/A 07/17/2014   Procedure: COLONOSCOPY;  Surgeon: Malissa Hippo, MD;  [facility];  Service: Endoscopy;  Laterality: N/A;  105  . ESOPHAGEAL DILATION N/A 06/11/2015   Procedure: ESOPHAGEAL DILATION;  Surgeon: Malissa Hippo, MD;  Location: [facility];  Service: Endoscopy;  Laterality: N/AElease Hashimoto 54/56/58  . ESOPHAGOGASTRODUODENOSCOPY N/A 07/17/2014   Procedure: ESOPHAGOGASTRODUODENOSCOPY (EGD);  Surgeon: Malissa Hippo, MD;  [facility];  Service: Endoscopy;  Laterality: N/A;  . ESOPHAGOGASTRODUODENOSCOPY (EGD) WITH PROPOFOL N/A 06/11/2015   Procedure: ESOPHAGOGASTRODUODENOSCOPY (EGD) WITH PROPOFOL;  Surgeon: Malissa Hippo, MD;  Location: [facility];  Service: Endoscopy;  Laterality: N/A;  730  . HERNIA REPAIR    . KNEE SURGERY    . MALONEY DILATION N/A 07/17/2014   Procedure: [name] DILATION;  Surgeon: Malissa Hippo, MD;  [facility];  Service: Endoscopy;  Laterality: N/A;  . UMBILICAL HERNIA REPAIR  2003    Current Outpatient Medications  Medication Sig Dispense Refill  . acetaminophen-codeine (TYLENOL #3) 300-30 MG per tablet Take 1 tablet by mouth every 4 (four) hours as needed. For pain  0  . ALPRAZolam (XANAX) 0.5 MG tablet Take 1 tablet by mouth 3 (three) times daily as needed. For anxiety  0  . aspirin EC 81 MG tablet Take 81 mg by mouth every morning.    . canagliflozin (INVOKANA) 100 MG TABS tablet Take 100 mg by mouth daily.    Marland Kitchen  Diclofenac Potassium 50 MG PACK Take 50 mg by mouth as directed. 9 each 3  . Fremanezumab-vfrm (AJOVY) 225 MG/1.5ML SOSY Inject 225 mg into the skin every 30 (thirty) days. 1 Syringe 11  . furosemide (LASIX) 20 MG tablet Take 40 mg by mouth every morning.    Marland Kitchen GLOBAL EASE INJECT PEN NEEDLES 31G X 5 MM MISC USE EVERY DAY AS DIRECTED EMERGENCY REFILL FAXED DR  0  . Liraglutide (VICTOZA [place]) Inject 1.8 mg into the skin daily.    Marland Kitchen lisinopril (PRINIVIL,ZESTRIL) 20 MG tablet Take 40 mg by mouth daily.     . metFORMIN (GLUCOPHAGE-XR) 500 MG  24 hr tablet Take 500 mg by mouth daily.  2  . metoprolol (TOPROL-XL) 200 MG 24 hr tablet Take 200 mg by mouth daily.      . ondansetron (ZOFRAN) 4 MG tablet Take 4 mg by mouth as needed for nausea or vomiting.    . polyethylene glycol (MIRALAX / GLYCOLAX) packet Take 17 g by mouth daily.     . potassium chloride SA (K-DUR,KLOR-CON) 20 MEQ tablet Take 20 mEq by mouth daily.     . simvastatin (ZOCOR) 40 MG tablet Take 40 mg by mouth daily.     No current facility-administered medications for this visit.    Allergies:  Amlodipine besylate; Morphine and related; Amitriptyline; Butorphanol tartrate; Compazine [prochlorperazine edisylate]; Rocephin [ceftriaxone sodium in dextrose]; and Sumatriptan   Social History: The patient  reports that  has never smoked. she has never used smokeless tobacco. She reports that she does not drink alcohol or use drugs.   ROS:  Please see the history of present illness. Otherwise, complete review of systems is positive for none.  All other systems are reviewed and negative.   Physical Exam: VS:  BP 120/72   Pulse 70   Ht 5\' 5"  (1.651 m)   Wt 187 lb 3.2 oz (84.9 kg)   SpO2 98%   BMI 31.15 kg/m , BMI Body mass index is 31.15 kg/m.  Wt Readings from Last 3 Encounters:  11/23/17 187 lb 3.2 oz (84.9 kg)  11/07/17 188 lb 6.4 oz (85.5 kg)  01/17/17 200 lb (90.7 kg)    General: Patient appears comfortable at rest. HEENT: Conjunctiva and lids normal, oropharynx clear. Neck: Supple, no elevated JVP or carotid bruits, no thyromegaly. Lungs: Clear to auscultation, nonlabored breathing at rest. Cardiac: Regular rate and rhythm, no S3, 2-3/6 systolic murmur, no pericardial rub. Abdomen: Soft, nontender, bowel sounds present. Extremities: No pitting edema, distal pulses 2+. Skin: Warm and dry. Musculoskeletal: No kyphosis. Neuropsychiatric: Alert and oriented x3, affect grossly appropriate.  ECG: I personally reviewed the tracing from 08/02/2016 which showed  sinus rhythm with decreased R wave progression.  Other Studies Reviewed Today:  Echocardiogram 06/08/2015: Study Conclusions  - Left ventricle: The cavity size was normal. Wall thickness was   increased in a pattern of mild LVH. Systolic function was normal.   The estimated ejection fraction was in the range of 60% to 65%.   Wall motion was normal; there were no regional wall motion   abnormalities. Doppler parameters are consistent with abnormal   left ventricular relaxation (grade 1 diastolic dysfunction). - Aortic valve: Mildly to moderately calcified annulus. Probably   bicuspid; mildly thickened, mild to moderately calcified   leaflets. There was mild to moderate stenosis. There was trivial   regurgitation. Peak velocity (S): 238 cm/s. Mean gradient (S): 11   mm Hg. Valve area (VTI): 1 cm^2. Valve  area (Vmax): 1.06 cm^2.   Valve area (Vmean): 1.14 cm^2. - Mitral valve: There was mild regurgitation.  Lexiscan Myoview 01/21/2016:  There was no ST segment deviation noted during stress.  The study is normal. No perfusion defects  The left ventricular ejection fraction is hyperdynamic (>65%).  This is a low risk study.  Assessment and Plan:  1. Recent onset chest pain as described above. ECG shows chronic abnormalities. She has a history of previously documented normal coronary arteries in 2008, although continuing cardiac risk factors including type 2 diabetes mellitus, hyperlipidemia, and hypertension. Last ischemic testing was in 2017. We will plan to follow-up with a Lexiscan Myoview.  2. Bicuspid aortic valve with mild to moderate aortic stenosis as of 2016. Follow-up echocardiogram will be obtained.  3. Essential hypertension, blood pressure is well controlled today.  4. Mixed hyperlipidemia, continues on statin therapy.  Current medicines were reviewed with the patient today.   Orders Placed This Encounter  Procedures  . NM Myocar Multi W/Spect W/Wall Motion / EF    . EKG 12-Lead  . ECHOCARDIOGRAM COMPLETE    Disposition: Call with test results and determine disposition.  Signed, Jonelle SidleSamuel G. McDowell, MD, [facility] Hospital[facility]ACC 11/23/2017 2:12 PM    [facility] [address], [place], [place] [address] Phone: [phone]) [phone]; Fax: [phone]) [phone]

## 2017-11-26 ENCOUNTER — Ambulatory Visit (HOSPITAL_BASED_OUTPATIENT_CLINIC_OR_DEPARTMENT_OTHER)
Admission: RE | Admit: 2017-11-26 | Discharge: 2017-11-26 | Disposition: A | Discharge status: Home / Self Care | Payer: PRIVATE HEALTH INSURANCE | Source: Ambulatory Visit | Attending: Cardiology | Admitting: Cardiology

## 2017-11-26 ENCOUNTER — Encounter (HOSPITAL_COMMUNITY)
Admission: RE | Admit: 2017-11-26 | Discharge: 2017-11-26 | Disposition: A | Discharge status: Home / Self Care | Payer: PRIVATE HEALTH INSURANCE | Source: Ambulatory Visit | Attending: Cardiology | Admitting: Cardiology

## 2017-11-26 ENCOUNTER — Encounter (HOSPITAL_BASED_OUTPATIENT_CLINIC_OR_DEPARTMENT_OTHER)
Admission: RE | Admit: 2017-11-26 | Discharge: 2017-11-26 | Disposition: A | Discharge status: Home / Self Care | Payer: PRIVATE HEALTH INSURANCE | Source: Ambulatory Visit | Attending: Cardiology | Admitting: Cardiology

## 2017-11-26 ENCOUNTER — Encounter (HOSPITAL_COMMUNITY): Payer: Self-pay

## 2017-11-26 DIAGNOSIS — I350 Nonrheumatic aortic (valve) stenosis: Secondary | ICD-10-CM | POA: Diagnosis not present

## 2017-11-26 DIAGNOSIS — E785 Hyperlipidemia, unspecified: Secondary | ICD-10-CM | POA: Insufficient documentation

## 2017-11-26 DIAGNOSIS — R072 Precordial pain: Secondary | ICD-10-CM | POA: Insufficient documentation

## 2017-11-26 DIAGNOSIS — I10 Essential (primary) hypertension: Secondary | ICD-10-CM | POA: Insufficient documentation

## 2017-11-26 DIAGNOSIS — Q231 Congenital insufficiency of aortic valve: Secondary | ICD-10-CM

## 2017-11-26 LAB — NM MYOCAR MULTI W/SPECT W/WALL MOTION / EF
CHL CUP NUCLEAR SDS: 1
CHL CUP NUCLEAR SRS: 0
CHL CUP NUCLEAR SSS: 1
LHR: 0.33
LV sys vol: 16 mL
LVDIAVOL: 61 mL (ref 46–106)
NUC STRESS TID: 1.15
Peak HR: 93 {beats}/min
Rest HR: 69 {beats}/min

## 2017-11-26 MED ORDER — TECHNETIUM TC 99M TETROFOSMIN IV KIT
10.0000 | PACK | Freq: Once | INTRAVENOUS | Status: AC | PRN
  Administered 2017-11-26: 10.8 via INTRAVENOUS

## 2017-11-26 MED ORDER — SODIUM CHLORIDE 0.9% FLUSH
INTRAVENOUS | Status: AC
  Administered 2017-11-26: 10 mL via INTRAVENOUS
  Filled 2017-11-26: qty 10

## 2017-11-26 MED ORDER — REGADENOSON 0.4 MG/5ML IV SOLN
INTRAVENOUS | Status: AC
  Administered 2017-11-26: 0.4 mg via INTRAVENOUS
  Filled 2017-11-26: qty 5

## 2017-11-26 MED ORDER — TECHNETIUM TC 99M TETROFOSMIN IV KIT
30.0000 | PACK | Freq: Once | INTRAVENOUS | Status: AC | PRN
Start: 2017-11-26 — End: 2017-11-26
  Administered 2017-11-26: 32.4 via INTRAVENOUS

## 2017-11-26 NOTE — Progress Notes (Signed)
*  PRELIMINARY RESULTS* Echocardiogram 2D Echocardiogram has been performed.  Stacey DrainWhite, Axxel Gude J 11/26/2017, 11:53 AM

## 2017-11-27 ENCOUNTER — Telehealth: Payer: Self-pay

## 2017-11-27 LAB — ECHOCARDIOGRAM COMPLETE
AO mean calculated velocity dopler: 192 cm/s
AOVTI: 69.7 cm
AV Area VTI: 0.65 cm2
AV Area mean vel: 0.81 cm2
AV VEL mean LVOT/AV: 0.23
AV area mean vel ind: 0.41 cm2/m2
AV peak Index: 0.32
AV pk vel: 338 cm/s
AV vel: 0.93
AVA: 0.93 cm2
AVAREAVTIIND: 0.46 cm2/m2
AVG: 19 mmHg
AVPG: 46 mmHg
Ao pk vel: 0.19 m/s
CHL CUP MV DEC (S): 264
CHL CUP RV SYS PRESS: 18 mmHg
CHL CUP STROKE VOLUME: 36 mL
E decel time: 264 msec
EERAT: 7.97
FS: 46 % — AB (ref 28–44)
IVS/LV PW RATIO, ED: 1.4
LA vol A4C: 35.5 ml
LADIAMINDEX: 1.55 cm/m2
LASIZE: 31 mm
LDCA: 3.46 cm2
LEFT ATRIUM END SYS DIAM: 31 mm
LV E/e' medial: 7.97
LV TDI E'LATERAL: 6.64
LV TDI E'MEDIAL: 5.77
LV dias vol index: 27 mL/m2
LV dias vol: 55 mL (ref 46–106)
LV e' LATERAL: 6.64 cm/s
LV sys vol index: 10 mL/m2
LVEEAVG: 7.97
LVOT VTI: 18.8 cm
LVOT diameter: 21 mm
LVOT peak VTI: 0.27 cm
LVOT peak grad rest: 2 mmHg
LVOT peak vel: 63.9 cm/s
LVOTSV: 65 mL
LVSYSVOL: 19 mL
MV pk A vel: 64.9 m/s
MV pk E vel: 52.9 m/s
PW: 9.42 mm — AB (ref 0.6–1.1)
RV LATERAL S' VELOCITY: 9.14 cm/s
Reg peak vel: 191 cm/s
Simpson's disk: 65
TAPSE: 16.1 mm
TRMAXVEL: 191 cm/s
Valve area index: 0.46

## 2017-11-27 NOTE — Telephone Encounter (Signed)
-----   Message from Jonelle SidleSamuel G McDowell, MD sent at 11/27/2017 11:38 AM EST ----- Results reviewed. I reviewed the images as well and amended the report. LVEF is normal. Aortic stenosis looks to be at least moderate at this point with increased gradients compared to the old study. We should see her back in the next 6 months. It is not clear to me however that should be necessarily be causing her chest pain. A copy of this test should be forwarded to Kirstie PeriShah, Ashish, MD.

## 2017-11-27 NOTE — Telephone Encounter (Signed)
-----   Message from Jonelle SidleSamuel G McDowell, MD sent at 11/27/2017 11:37 AM EST ----- Results reviewed. Please let her know that the stress test was low risk, no definite ischemia to suggest progressive CAD as cause of her recent chest discomfort. A copy of this test should be forwarded to Kirstie PeriShah, Ashish, MD.

## 2017-11-27 NOTE — Telephone Encounter (Signed)
Patient notified. Routed to PCP 

## 2017-11-29 ENCOUNTER — Ambulatory Visit: Payer: PRIVATE HEALTH INSURANCE | Admitting: Cardiology

## 2018-02-06 ENCOUNTER — Ambulatory Visit: Payer: PRIVATE HEALTH INSURANCE | Admitting: Neurology

## 2018-06-05 ENCOUNTER — Ambulatory Visit (INDEPENDENT_AMBULATORY_CARE_PROVIDER_SITE_OTHER): Payer: PRIVATE HEALTH INSURANCE

## 2018-06-05 ENCOUNTER — Encounter (INDEPENDENT_AMBULATORY_CARE_PROVIDER_SITE_OTHER): Payer: Self-pay | Admitting: Orthopaedic Surgery

## 2018-06-05 ENCOUNTER — Ambulatory Visit (INDEPENDENT_AMBULATORY_CARE_PROVIDER_SITE_OTHER): Payer: PRIVATE HEALTH INSURANCE | Admitting: Orthopaedic Surgery

## 2018-06-05 VITALS — BP 103/63 | HR 80 | Ht 65.0 in | Wt 185.0 lb

## 2018-06-05 DIAGNOSIS — M25561 Pain in right knee: Secondary | ICD-10-CM

## 2018-06-05 DIAGNOSIS — G8929 Other chronic pain: Secondary | ICD-10-CM | POA: Diagnosis not present

## 2018-06-05 NOTE — Addendum Note (Signed)
Addended by: Thornell MuleVILLAREAL, Analya Louissaint R on: 06/05/2018 01:10 PM   Modules accepted: Orders

## 2018-06-05 NOTE — Progress Notes (Signed)
Office Visit Note   Patient: Stacy Frost           Date of Birth: Mar 03, 1961           MRN: [id number] Visit Date: 06/05/2018              Requested by: Kirstie Peri, MD [address] [place], [place] [address] PCP: Kirstie Peri, MD   Assessment & Plan: Visit Diagnoses:  1. Chronic pain of right knee     Plan: Recurrent right knee pain that is probably a combination of factors including arthritis and possibly a new meniscal tear.  Will order MRI scan.  Right knee feels perfectly stable.  Films reveal minimal arthritic change.  Having more pain than I can determine by exam or by film  Follow-Up Instructions: Return after MRI right knee.   Orders:  Orders Placed This Encounter  Procedures  . XR KNEE 3 VIEW RIGHT   No orders of the defined types were placed in this encounter.     Procedures: No procedures performed   Clinical Data: No additional findings.   Subjective: Chief Complaint  Patient presents with  . Follow-up    RECURRENT R KNEE PAIN GOING ON FOR 2 YS, GIVING AWAY, CONSTANCE ACHE, LIMPING BUT CAUSING L HIP TO START HURTING. LAST INJECTION 06/2017  Stacy Frost has been followed on multiple occasions over the past years.  She is had a prior right knee arthroscopy with evidence of a meniscal tear and some mild arthritis.  She also had evidence of ACL stretch.  I have injected her knee with cortisone with good results in the past.  She returns today noting that she has had some recurrent pain in her knee without read injury.  She is having difficulty at night with the knee being achy and sore and stiff.  On occasion she has had a sensation of her knee giving way.  HPI  Review of Systems  Constitutional: Negative for fatigue and fever.  HENT: Negative for ear pain.   Eyes: Negative for pain.  Respiratory: Negative for cough and shortness of breath.   Cardiovascular: Positive for leg swelling.  Gastrointestinal: Negative for constipation and diarrhea.  Genitourinary:  Negative for difficulty urinating.  Musculoskeletal: Negative for back pain and neck pain.  Skin: Negative for rash.  Allergic/Immunologic: Negative for food allergies.  Neurological: Positive for weakness. Negative for numbness.  Psychiatric/Behavioral: Positive for suicidal ideas.     Objective: Vital Signs: BP 103/63   Pulse 80   Ht 5\' 5"  (1.651 m)   Wt 185 lb (83.9 kg)   BMI 30.79 kg/m   Physical Exam  Constitutional: She is oriented to person, place, and time. She appears well-developed and well-nourished.  HENT:  Mouth/Throat: Oropharynx is clear and moist.  Eyes: Pupils are equal, round, and reactive to light. EOM are normal.  Pulmonary/Chest: Effort normal.  Neurological: She is alert and oriented to person, place, and time.  Skin: Skin is warm and dry.  Psychiatric: She has a normal mood and affect. Her behavior is normal.    Ortho Exam awake alert and oriented x3.  Comfortable sitting.  Right knee without effusion.  Mild medial lateral joint discomfort.  Negative anterior drawer sign.  Negative Lockman's.  Minimal opening medially with a valgus stress of several millimeters.  Minimal patellar crepitation.  No popliteal mass or fullness.  No calf pain.  Straight leg raise negative.  Painless range of motion both hips Specialty Comments:  No specialty comments  available.  Imaging: Xr Knee 3 View Right  Result Date: 06/05/2018 Films of the right knee were obtained in 3 projections standing.  There are mild degenerative changes needle compartment with some subchondral sclerosis.  No osteophyte formation.  Joint space may be minimally narrowed.  Alignment appears to be neutral.  Mild degenerative changes at the patellofemoral joint.  No acute changes    PMFS History: Patient Active Problem List   Diagnosis Date Noted  . Unilateral primary osteoarthritis, right knee 09/06/2016  . Dysphagia 06/23/2014  . Nausea 06/23/2014  . Weight loss 06/23/2014  . Abdominal pain,  chronic, epigastric 09/05/2013  . Preoperative evaluation to rule out surgical contraindication 07/12/2012  . HYPERLIPIDEMIA 06/29/2008  . Essential hypertension, benign 06/01/2008  . Bicuspid aortic valve 06/01/2008   Past Medical History:  Diagnosis Date  . Anxiety   . Aortic stenosis    Mild to moderate  . Bicuspid aortic valve   . Essential hypertension, benign   . History of cardiac catheterization    Normal coronaries 2008  . Hyperlipidemia   . Migraine headache   . Obesity   . Pain in joint, ankle and foot    Chronic  . Palpitations    Documented PACs by previous monitoring  . Restless leg syndrome   . Skin tag   . Type 2 diabetes mellitus (HCC)   . Unilateral primary osteoarthritis, right knee 09/06/2016    Family History  Problem Relation Age of Onset  . Depression Mother   . Cancer Mother        Lung mets (unsure as to what kind)  . Diabetes Mother   . Hypertension Father   . Depression Father   . Heart attack Father   . Depression Daughter   . Anxiety disorder Daughter   . Stroke Daughter   . Transient ischemic attack Brother   . Emphysema Maternal Grandfather   . Brain cancer Paternal Grandmother   . Heart attack Paternal Grandfather     Past Surgical History:  Procedure Laterality Date  . ACHILLES TENDON REPAIR  2003   Left  . CARDIAC CATHETERIZATION  2008   Normal coronary arteries; normal LV systolic function; mild dilation of aortic root; mild dilation of proximal ascending aorta; likely bicuspid aortic valve  . CHOLECYSTECTOMY  2001   "Poor EF at 17%"  . COLONOSCOPY N/A 07/17/2014   Procedure: COLONOSCOPY;  Surgeon: Malissa HippoNajeeb U Rehman, MD;  [facility];  Service: Endoscopy;  Laterality: N/A;  105  . ESOPHAGEAL DILATION N/A 06/11/2015   Procedure: ESOPHAGEAL DILATION;  Surgeon: Malissa HippoNajeeb U Rehman, MD;  Location: [facility];  Service: Endoscopy;  Laterality: N/AElease Hashimoto;  Maloney 54/56/58  . ESOPHAGOGASTRODUODENOSCOPY N/A 07/17/2014   Procedure:  ESOPHAGOGASTRODUODENOSCOPY (EGD);  Surgeon: Malissa HippoNajeeb U Rehman, MD;  [facility];  Service: Endoscopy;  Laterality: N/A;  . ESOPHAGOGASTRODUODENOSCOPY (EGD) WITH PROPOFOL N/A 06/11/2015   Procedure: ESOPHAGOGASTRODUODENOSCOPY (EGD) WITH PROPOFOL;  Surgeon: Malissa HippoNajeeb U Rehman, MD;  Location: [facility];  Service: Endoscopy;  Laterality: N/A;  730  . HERNIA REPAIR    . KNEE SURGERY    . MALONEY DILATION N/A 07/17/2014   Procedure: Elease HashimotoMALONEY DILATION;  Surgeon: Malissa HippoNajeeb U Rehman, MD;  [facility];  Service: Endoscopy;  Laterality: N/A;  . UMBILICAL HERNIA REPAIR  2003   Social History   Occupational History  . Occupation: Home maker  Tobacco Use  . Smoking status: Never Smoker  . Smokeless tobacco: Never Used  Substance and Sexual Activity  .  Alcohol use: No    Alcohol/week: 0.0 oz  . Drug use: No  . Sexual activity: Not on file

## 2018-06-06 ENCOUNTER — Other Ambulatory Visit (INDEPENDENT_AMBULATORY_CARE_PROVIDER_SITE_OTHER): Payer: Self-pay | Admitting: Radiology

## 2018-06-06 DIAGNOSIS — G8929 Other chronic pain: Secondary | ICD-10-CM

## 2018-06-06 DIAGNOSIS — M25561 Pain in right knee: Principal | ICD-10-CM

## 2018-06-12 ENCOUNTER — Ambulatory Visit (INDEPENDENT_AMBULATORY_CARE_PROVIDER_SITE_OTHER): Payer: PRIVATE HEALTH INSURANCE | Admitting: Orthopaedic Surgery

## 2018-06-12 ENCOUNTER — Encounter (INDEPENDENT_AMBULATORY_CARE_PROVIDER_SITE_OTHER): Payer: Self-pay | Admitting: Orthopaedic Surgery

## 2018-06-12 DIAGNOSIS — G8929 Other chronic pain: Secondary | ICD-10-CM

## 2018-06-12 DIAGNOSIS — M25561 Pain in right knee: Secondary | ICD-10-CM

## 2018-06-12 NOTE — Progress Notes (Signed)
Office Visit Note   Patient: Stacy SallesLisa Frost           Date of Birth: 1961/08/14           MRN: [id number] Visit Date: 06/12/2018              Requested by: Kirstie PeriShah, Ashish, MD [address] [place], [place] [address] PCP: Kirstie PeriShah, Ashish, MD   Assessment & Plan: Visit Diagnoses:  1. Chronic pain of right knee     Plan: Chronic right knee pain is previously outlined exercises cortisone injection and over-the-counter medicines.  MRI scan demonstrates a radial tear of the posterior horn of the medial meniscus with peripheral meniscal extrusion.  A radial tear of the posterior horn of the lateral meniscus.  There is partial-thickness cartilage loss of the medial patellofemoral compartment partial thickness cartilage loss of the medial as well.  Lateral compartment was clear.  Based on her poor response to nonoperative treatment discussed arthroscopic debridement.  Of also discussed the potential limitations given the arthritis.  Mrs. Stacy Frost would like to proceed with arthroscopy.  Hopefully it will help.  I am just concerned that the arthritis may cause some persistent pain  Follow-Up Instructions: Return will schedule surgery right knee.   Orders:  No orders of the defined types were placed in this encounter.  No orders of the defined types were placed in this encounter.     Procedures: No procedures performed   Clinical Data: No additional findings.   Subjective: No chief complaint on file. Persistent discomfort predominant along the medial aspect of the right knee.  MRI scan demonstrates a tear of the medial and lateral menisci with degenerative changes at the patellofemoral compartments  HPI  Review of Systems   Objective: Vital Signs: There were no vitals taken for this visit.  Physical Exam  Ortho Exam awake alert and oriented x3.  Comfortable sitting.  Examination of the right knee reveals full extension and flexion over 105 degrees.  No instability.  Some patellar crepitation and  pain with patella compression.  Most of the pain was localized along the medial compartment.  No popping or clicking.  Specialty Comments:  No specialty comments available.  Imaging: No results found.   PMFS History: Patient Active Problem List   Diagnosis Date Noted  . Unilateral primary osteoarthritis, right knee 09/06/2016  . Dysphagia 06/23/2014  . Nausea 06/23/2014  . Weight loss 06/23/2014  . Abdominal pain, chronic, epigastric 09/05/2013  . Preoperative evaluation to rule out surgical contraindication 07/12/2012  . HYPERLIPIDEMIA 06/29/2008  . Essential hypertension, benign 06/01/2008  . Bicuspid aortic valve 06/01/2008   Past Medical History:  Diagnosis Date  . Anxiety   . Aortic stenosis    Mild to moderate  . Bicuspid aortic valve   . Essential hypertension, benign   . History of cardiac catheterization    Normal coronaries 2008  . Hyperlipidemia   . Migraine headache   . Obesity   . Pain in joint, ankle and foot    Chronic  . Palpitations    Documented PACs by previous monitoring  . Restless leg syndrome   . Skin tag   . Type 2 diabetes mellitus (HCC)   . Unilateral primary osteoarthritis, right knee 09/06/2016    Family History  Problem Relation Age of Onset  . Depression Mother   . Cancer Mother        Lung mets (unsure as to what kind)  . Diabetes Mother   . Hypertension Father   .  Depression Father   . Heart attack Father   . Depression Daughter   . Anxiety disorder Daughter   . Stroke Daughter   . Transient ischemic attack Brother   . Emphysema Maternal Grandfather   . Brain cancer Paternal Grandmother   . Heart attack Paternal Grandfather     Past Surgical History:  Procedure Laterality Date  . ACHILLES TENDON REPAIR  2003   Left  . CARDIAC CATHETERIZATION  2008   Normal coronary arteries; normal LV systolic function; mild dilation of aortic root; mild dilation of proximal ascending aorta; likely bicuspid aortic valve  .  CHOLECYSTECTOMY  2001   "Poor EF at 17%"  . COLONOSCOPY N/A 07/17/2014   Procedure: COLONOSCOPY;  Surgeon: Malissa HippoNajeeb U Rehman, MD;  [facility];  Service: Endoscopy;  Laterality: N/A;  105  . ESOPHAGEAL DILATION N/A 06/11/2015   Procedure: ESOPHAGEAL DILATION;  Surgeon: Malissa HippoNajeeb U Rehman, MD;  Location: [facility];  Service: Endoscopy;  Laterality: N/AElease Hashimoto;  Maloney 54/56/58  . ESOPHAGOGASTRODUODENOSCOPY N/A 07/17/2014   Procedure: ESOPHAGOGASTRODUODENOSCOPY (EGD);  Surgeon: Malissa HippoNajeeb U Rehman, MD;  [facility];  Service: Endoscopy;  Laterality: N/A;  . ESOPHAGOGASTRODUODENOSCOPY (EGD) WITH PROPOFOL N/A 06/11/2015   Procedure: ESOPHAGOGASTRODUODENOSCOPY (EGD) WITH PROPOFOL;  Surgeon: Malissa HippoNajeeb U Rehman, MD;  Location: [facility];  Service: Endoscopy;  Laterality: N/A;  730  . HERNIA REPAIR    . KNEE SURGERY    . MALONEY DILATION N/A 07/17/2014   Procedure: Elease HashimotoMALONEY DILATION;  Surgeon: Malissa HippoNajeeb U Rehman, MD;  [facility];  Service: Endoscopy;  Laterality: N/A;  . UMBILICAL HERNIA REPAIR  2003   Social History   Occupational History  . Occupation: Home maker  Tobacco Use  . Smoking status: Never Smoker  . Smokeless tobacco: Never Used  Substance and Sexual Activity  . Alcohol use: No    Alcohol/week: 0.0 standard drinks  . Drug use: No  . Sexual activity: Not on file     Valeria BatmanPeter W Jaslin Novitski, MD   Note - This record has been created using [employer] software.  Chart creation errors have been sought, but may not always  have been located. Such creation errors do not reflect on  the standard of medical care.

## 2018-06-20 ENCOUNTER — Telehealth (INDEPENDENT_AMBULATORY_CARE_PROVIDER_SITE_OTHER): Payer: Self-pay | Admitting: Orthopaedic Surgery

## 2018-06-20 NOTE — Telephone Encounter (Signed)
[facility]

## 2018-06-20 NOTE — Telephone Encounter (Signed)
Let me know the date of surgery so I can do preop orders

## 2018-06-20 NOTE — Telephone Encounter (Signed)
Yes, schedule at [facility]

## 2018-06-20 NOTE — Telephone Encounter (Signed)
°  FYI: [facility]  I received the following information by email at 10:28 this morning.    The following patient has been cancelled by anesthesia due to aortic stenosis. Please notify surgeon and patient. Thanks!  Dross, Milarose F  (dob 12-01-1960) RIGHT KNEE ARTHROSCOPY DEBRIDEMENT MEDIAL MENISCECTOMY-  06-27-18 9AM    Charlies Constableori Jarrell RN BSN (Pre-op Assessment) [phone] ext. [phone] [email or web address] [facility]  [email or web address] [address] [place] [address] [email or web address] - Clinical Quality - Integrity - Service Excellence - Teamwork

## 2018-06-20 NOTE — Telephone Encounter (Signed)
Please schedule

## 2018-06-21 ENCOUNTER — Telehealth: Payer: Self-pay | Admitting: Cardiology

## 2018-06-21 NOTE — Telephone Encounter (Signed)
Patient will be having surgery in Sept 2019 She called to tell us to be on the look out for clearance

## 2018-06-21 NOTE — Telephone Encounter (Signed)
Noted  

## 2018-06-26 NOTE — Pre-Procedure Instructions (Signed)
Stacy Frost  06/26/2018      [facility] - [place], [place] - [place], [place] - [address] [place] [address] Phone: [phone] Fax: [phone]  [facility] - [id number] - [place], [place] - [address] [place] [address] Phone: [phone] Fax: [phone]    Your procedure is scheduled on   Tuesday  07/02/18  Report to [facility] Admitting at 1100 A.M.  Call this number if you have problems the morning of surgery:  [phone]   Remember:  Do not eat or drink after midnight.     Take these medicines the morning of surgery with A SIP OF WATER -   TYLENOL 3 IF NEEDED, ALPRAZOLAM,  METOPROLOL  7 days prior to surgery STOP taking any Aspirin(unless otherwise instructed by your surgeon), Aleve, Naproxen, Ibuprofen, Motrin, Advil, Goody's, BC's, all herbal medications, fish oil, and all vitamins      How to Manage Your Diabetes Before and After Surgery  Why is it important to control my blood sugar before and after surgery? . Improving blood sugar levels before and after surgery helps healing and can limit problems. . A way of improving blood sugar control is eating a healthy diet by: o  Eating less sugar and carbohydrates o  Increasing activity/exercise o  Talking with your doctor about reaching your blood sugar goals . High blood sugars (greater than 180 mg/dL) can raise your risk of infections and slow your recovery, so you will need to focus on controlling your diabetes during the weeks before surgery. . Make sure that the doctor who takes care of your diabetes knows about your planned surgery including the date and location.  How do I manage my blood sugar before surgery? . Check your blood sugar at least 4 times a day, starting 2 days before surgery, to make sure that the level is not too high or low. o Check your blood sugar the morning of your surgery when you wake up and every 2 hours until you get to the  Short Stay unit. . If your blood sugar is less than 70 mg/dL, you will need to treat for low blood sugar: o Do not take insulin. o Treat a low blood sugar (less than 70 mg/dL) with  cup of clear juice (cranberry or apple), 4 glucose tablets, OR glucose gel. Recheck blood sugar in 15 minutes after treatment (to make sure it is greater than 70 mg/dL). If your blood sugar is not greater than 70 mg/dL on recheck, call [phone] o  for further instructions. . Report your blood sugar to the short stay nurse when you get to Short Stay.  . If you are admitted to the hospital after surgery: o Your blood sugar will be checked by the staff and you will probably be given insulin after surgery (instead of oral diabetes medicines) to make sure you have good blood sugar levels. o The goal for blood sugar control after surgery is 80-180 mg/dL.      WHAT DO I DO ABOUT MY DIABETES MEDICATION?   Marland Kitchen Do not take oral diabetes medicines (pills) the morning of surgery.        Do not wear jewelry, make-up or nail polish.  Do not wear lotions, powders, or perfumes, or deodorant.  Do not shave 48 hours prior to surgery.  Men may shave face and neck.  Do not bring valuables to the hospital.  [facility] is  not responsible for any belongings or valuables.  Contacts, dentures or bridgework may not be worn into surgery.  Leave your suitcase in the car.  After surgery it may be brought to your room.  For patients admitted to the hospital, discharge time will be determined by your treatment team.  Patients discharged the day of surgery will not be allowed to drive home.   Name and phone number of your driver:    Special instructions:  [facility] - Preparing for Surgery  Before surgery, you can play an important role.  Because skin is not sterile, your skin needs to be as free of germs as possible.  You can reduce the number of germs on you skin by washing with CHG (chlorahexidine gluconate) soap before  surgery.  CHG is an antiseptic cleaner which kills germs and bonds with the skin to continue killing germs even after washing.  Oral Hygiene is also important in reducing the risk of infection.  Remember to brush your teeth with your regular toothpaste the morning of surgery.  Please DO NOT use if you have an allergy to CHG or antibacterial soaps.  If your skin becomes reddened/irritated stop using the CHG and inform your nurse when you arrive at Short Stay.  Do not shave (including legs and underarms) for at least 48 hours prior to the first CHG shower.  You may shave your face.  Please follow these instructions carefully:   1.  Shower with CHG Soap the night before surgery and the morning of Surgery.  2.  If you choose to wash your hair, wash your hair first as usual with your normal shampoo.  3.  After you shampoo, rinse your hair and body thoroughly to remove the shampoo. 4.  Use CHG as you would any other liquid soap.  You can apply chg directly to the skin and wash gently with a      scrungie or washcloth.           5.  Apply the CHG Soap to your body ONLY FROM THE NECK DOWN.   Do not use on open wounds or open sores. Avoid contact with your eyes, ears, mouth and genitals (private parts).  Wash genitals (private parts) with your normal soap.  6.  Wash thoroughly, paying special attention to the area where your surgery will be performed.  7.  Thoroughly rinse your body with warm water from the neck down.  8.  DO NOT shower/wash with your normal soap after using and rinsing off the CHG Soap.  9.  Pat yourself dry with a clean towel.            10.  Wear clean pajamas.            11.  Place clean sheets on your bed the night of your first shower and do not sleep with pets.  Day of Surgery  Do not apply any lotions/deoderants the morning of surgery.   Please wear clean clothes to the hospital/surgery center. Remember to brush your teeth with toothpaste.     Please read over the  following fact sheets that you were given. Pain Booklet, MRSA Information and Surgical Site Infection Prevention

## 2018-06-26 NOTE — Pre-Procedure Instructions (Addendum)
Stacy Frost  06/26/2018    Your procedure is scheduled on Tuesday, July 02, 2018 at 1:00 PM.   Report to [facility] Admitting at 11:00 AM.   Call this number if you have problems the morning of surgery: [phone]   Questions prior to day of surgery, please call [phone] between 8 & 4 PM.   Remember:  Do not eat or drink after midnight Monday, 07/01/18.            Take these medicines the morning of surgery with A SIP OF WATER: Metoprolol (Toprol XL), Alprazolam (Xanax) - if needed, Tylenol - if needed  Stop Aspirin as instructed by your surgeon/physician. Do not use NSAIDS (Ibuprofen, Aleve, etc) or Aspirin products prior to surgery.   Morning of surgery, do not take Metformin (Glucophage), Canagliflozin (Invokana) or Liraglutide (Victoza)    How to Manage Your Diabetes Before and After Surgery  Why is it important to control my blood sugar before and after surgery? . Improving blood sugar levels before and after surgery helps healing and can limit problems. . A way of improving blood sugar control is eating a healthy diet by: o  Eating less sugar and carbohydrates o  Increasing activity/exercise o  Talking with your doctor about reaching your blood sugar goals . High blood sugars (greater than 180 mg/dL) can raise your risk of infections and slow your recovery, so you will need to focus on controlling your diabetes during the weeks before surgery. . Make sure that the doctor who takes care of your diabetes knows about your planned surgery including the date and location.  How do I manage my blood sugar before surgery? . Check your blood sugar at least 4 times a day, starting 2 days before surgery, to make sure that the level is not too high or low. o Check your blood sugar the morning of your surgery when you wake up and every 2 hours until you get to the Short Stay unit. . If your blood sugar is less than 70 mg/dL, you will need to treat for low blood  sugar: o Do not take insulin. o Treat a low blood sugar (less than 70 mg/dL) with  cup of clear juice (cranberry or apple), 4 glucose tablets, OR glucose gel. Recheck blood sugar in 15 minutes after treatment (to make sure it is greater than 70 mg/dL). If your blood sugar is not greater than 70 mg/dL on recheck, call [phone] o  for further instructions. . Report your blood sugar to the short stay nurse when you get to Short Stay.  . If you are admitted to the hospital after surgery: o Your blood sugar will be checked by the staff and you will probably be given insulin after surgery (instead of oral diabetes medicines) to make sure you have good blood sugar levels. o The goal for blood sugar control after surgery is 80-180 mg/dL.    Do not wear jewelry, make-up or nail polish.  Do not wear lotions, powders, perfumes or deodorant.  Do not shave 48 hours prior to surgery.    Do not bring valuables to the hospital.  [facility] is not responsible for any belongings or valuables.  Contacts, dentures or bridgework may not be worn into surgery.  Leave your suitcase in the car.  After surgery it may be brought to your room.  For patients admitted to the hospital, discharge time will be determined by your treatment team.  Patients discharged the  day of surgery will not be allowed to drive home.   Name and phone number of your driver:    Special instructions:  [facility] - Preparing for Surgery  Before surgery, you can play an important role.  Because skin is not sterile, your skin needs to be as free of germs as possible.  You can reduce the number of germs on you skin by washing with CHG (chlorahexidine gluconate) soap before surgery.  CHG is an antiseptic cleaner which kills germs and bonds with the skin to continue killing germs even after washing.  Oral Hygiene is also important in reducing the risk of infection.  Remember to brush your teeth with your regular toothpaste the morning of  surgery.  Please DO NOT use if you have an allergy to CHG or antibacterial soaps.  If your skin becomes reddened/irritated stop using the CHG and inform your nurse when you arrive at Short Stay.  Do not shave (including legs and underarms) for at least 48 hours prior to the first CHG shower.  You may shave your face.  Please follow these instructions carefully:   1.  Shower with CHG Soap the night before surgery and the morning of Surgery.  2.  If you choose to wash your hair, wash your hair first as usual with your normal shampoo.  3.  After you shampoo, rinse your hair and body thoroughly to remove the shampoo. 4.  Use CHG as you would any other liquid soap.  You can apply chg directly to the skin and wash gently with a      scrungie or washcloth.           5.  Apply the CHG Soap to your body ONLY FROM THE NECK DOWN.   Do not use on open wounds or open sores. Avoid contact with your eyes, ears, mouth and genitals (private parts).  Wash genitals (private parts) with your normal soap.  6.  Wash thoroughly, paying special attention to the area where your surgery will be performed.  7.  Thoroughly rinse your body with warm water from the neck down.  8.  DO NOT shower/wash with your normal soap after using and rinsing off the CHG Soap.  9.  Pat yourself dry with a clean towel.            10.  Wear clean pajamas.            11.  Place clean sheets on your bed the night of your first shower and do not sleep with pets.  Day of Surgery  Shower as above. Do not apply any lotions/deodorants the morning of surgery.   Please wear clean clothes to the hospital. Remember to brush your teeth with toothpaste.   Please read over the fact sheets that you were given.

## 2018-06-27 ENCOUNTER — Telehealth: Payer: Self-pay | Admitting: Cardiology

## 2018-06-27 ENCOUNTER — Encounter (HOSPITAL_COMMUNITY): Payer: Self-pay

## 2018-06-27 ENCOUNTER — Encounter (HOSPITAL_COMMUNITY)
Admission: RE | Admit: 2018-06-27 | Discharge: 2018-06-27 | Disposition: A | Discharge status: Home / Self Care | Payer: PRIVATE HEALTH INSURANCE | Source: Ambulatory Visit | Attending: Orthopaedic Surgery | Admitting: Orthopaedic Surgery

## 2018-06-27 ENCOUNTER — Other Ambulatory Visit: Payer: Self-pay

## 2018-06-27 DIAGNOSIS — I350 Nonrheumatic aortic (valve) stenosis: Secondary | ICD-10-CM | POA: Diagnosis not present

## 2018-06-27 DIAGNOSIS — K219 Gastro-esophageal reflux disease without esophagitis: Secondary | ICD-10-CM | POA: Diagnosis not present

## 2018-06-27 DIAGNOSIS — E785 Hyperlipidemia, unspecified: Secondary | ICD-10-CM | POA: Insufficient documentation

## 2018-06-27 DIAGNOSIS — E119 Type 2 diabetes mellitus without complications: Secondary | ICD-10-CM | POA: Insufficient documentation

## 2018-06-27 DIAGNOSIS — Z7984 Long term (current) use of oral hypoglycemic drugs: Secondary | ICD-10-CM | POA: Insufficient documentation

## 2018-06-27 DIAGNOSIS — Z01812 Encounter for preprocedural laboratory examination: Secondary | ICD-10-CM | POA: Insufficient documentation

## 2018-06-27 DIAGNOSIS — S83241A Other tear of medial meniscus, current injury, right knee, initial encounter: Secondary | ICD-10-CM | POA: Insufficient documentation

## 2018-06-27 DIAGNOSIS — Z7982 Long term (current) use of aspirin: Secondary | ICD-10-CM | POA: Diagnosis not present

## 2018-06-27 DIAGNOSIS — Z79899 Other long term (current) drug therapy: Secondary | ICD-10-CM | POA: Insufficient documentation

## 2018-06-27 DIAGNOSIS — I10 Essential (primary) hypertension: Secondary | ICD-10-CM | POA: Diagnosis not present

## 2018-06-27 HISTORY — DX: Nonrheumatic mitral (valve) prolapse: I34.1

## 2018-06-27 HISTORY — DX: Gastro-esophageal reflux disease without esophagitis: K21.9

## 2018-06-27 LAB — BASIC METABOLIC PANEL
Anion gap: 10 (ref 5–15)
BUN: 14 mg/dL (ref 6–20)
CALCIUM: 9.6 mg/dL (ref 8.9–10.3)
CO2: 29 mmol/L (ref 22–32)
Chloride: 100 mmol/L (ref 98–111)
Creatinine, Ser: 0.87 mg/dL (ref 0.44–1.00)
GFR calc non Af Amer: >60 mL/min (ref >60)
GLUCOSE: 190 mg/dL — AB (ref 70–99)
Potassium: 3.8 mmol/L (ref 3.5–5.1)
Sodium: 139 mmol/L (ref 135–145)

## 2018-06-27 LAB — GLUCOSE, CAPILLARY: Glucose-Capillary: 165 mg/dL — ABNORMAL HIGH (ref 70–99)

## 2018-06-27 LAB — CBC
HCT: 42.4 % (ref 36.0–46.0)
HEMOGLOBIN: 13.9 g/dL (ref 12.0–15.0)
MCH: 29 pg (ref 26.0–34.0)
MCHC: 32.8 g/dL (ref 30.0–36.0)
MCV: 88.3 fL (ref 78.0–100.0)
Platelets: 273 10*3/uL (ref 150–400)
RBC: 4.8 MIL/uL (ref 3.87–5.11)
RDW: 12.6 % (ref 11.5–15.5)
WBC: 7.5 10*3/uL (ref 4.0–10.5)

## 2018-06-27 LAB — HEMOGLOBIN A1C
HEMOGLOBIN A1C: 6.7 % — AB (ref 4.8–5.6)
Mean Plasma Glucose: 145.59 mg/dL

## 2018-06-27 NOTE — Telephone Encounter (Signed)
I do not believe that we have received any type of preoperative clearance form as you mentioned, please have Dr. Hoy RegisterWhitfield's office send this to us today.  I do not necessarily anticipate any further testing prior to surgery based on her cardiac studies from within the last 6 months however.  I will be out of the office this afternoon, but will complete the form tomorrow if it is sent to the [place] office.

## 2018-06-27 NOTE — Telephone Encounter (Signed)
Patient walk in   Asking about her surgical clearance letter.  Stated that her surgery is Tuesday and surgeon has not gotten clearance from Dr Diona BrownerMcDowell yet

## 2018-06-27 NOTE — Telephone Encounter (Signed)
Spoke with Dr Serita GritWhitfields office and said [facility] - confirmed with staff that form was in Dr Orson GearMcDowells folder for review

## 2018-06-27 NOTE — Telephone Encounter (Signed)
We have not received clearance form, however DR Cleophas DunkerWhitfield procedure in Epic scheduled for Tuesday - will forward to provider if pt ok for surgery

## 2018-06-27 NOTE — Progress Notes (Signed)
Pt has hx of Aortic Valve stenosis and MVP. Pt denies any recent chest pain or sob. She sees Dr. Diona BrownerMcDowell as her cardiologist. She states she has requested a clearance from him. Not in Epic so far. Pt is a type 2 diabetic. She states her last A1C was 3 months ago and it was 6.6. She states she does not check her blood sugar at home. She states she does have a meter at home but just doesn't use it.

## 2018-06-27 NOTE — H&P (Addendum)
Stacy Campbell, MD   Jacqualine Code, PA-C [address] [place], [place]  [address]                             [phone]   ORTHOPAEDIC HISTORY & PHYSICAL  Stacy Frost MRN:  [id number] DOB/SEX:  08-03-1961/female  CHIEF COMPLAINT:  Painful right knee  HISTORY: Chronic right knee pain is previously outlined exercises cortisone injection and over-the-counter medicines.  MRI scan demonstrates a radial tear of the posterior horn of the medial meniscus with peripheral meniscal extrusion.  A radial tear of the posterior horn of the lateral meniscus.  There is partial-thickness cartilage loss of the medial patellofemoral compartment partial thickness cartilage loss of the medial as well.  Lateral compartment was clear.  Based on her poor response to nonoperative treatment discussed arthroscopic debridement. Also discussed the potential limitations given the arthritis.       PAST MEDICAL HISTORY: Patient Active Problem List   Diagnosis Date Noted  . Unilateral primary osteoarthritis, right knee 09/06/2016  . Dysphagia 06/23/2014  . Nausea 06/23/2014  . Weight loss 06/23/2014  . Abdominal pain, chronic, epigastric 09/05/2013  . Preoperative evaluation to rule out surgical contraindication 07/12/2012  . HYPERLIPIDEMIA 06/29/2008  . Essential hypertension, benign 06/01/2008  . Bicuspid aortic valve 06/01/2008   Past Medical History:  Diagnosis Date  . Anxiety   . Aortic stenosis    Mild to moderate  . Bicuspid aortic valve   . Essential hypertension, benign   . GERD (gastroesophageal reflux disease)   . History of cardiac catheterization    Normal coronaries 2008  . Hyperlipidemia   . Migraine headache   . MVP (mitral valve prolapse)   . Obesity   . Pain in joint, ankle and foot    Chronic  . Palpitations    Documented PACs by previous monitoring  . Restless leg syndrome   . Skin tag   . Type 2 diabetes mellitus (HCC)   . Unilateral primary osteoarthritis, right  knee 09/06/2016   Past Surgical History:  Procedure Laterality Date  . ACHILLES TENDON REPAIR  2003   Left  . CARDIAC CATHETERIZATION  2008   Normal coronary arteries; normal LV systolic function; mild dilation of aortic root; mild dilation of proximal ascending aorta; likely bicuspid aortic valve  . CHOLECYSTECTOMY  2001   "Poor EF at 17%"  . COLONOSCOPY N/A 07/17/2014   Procedure: COLONOSCOPY;  Surgeon: Malissa Hippo, MD;  [facility];  Service: Endoscopy;  Laterality: N/A;  105  . ESOPHAGEAL DILATION N/A 06/11/2015   Procedure: ESOPHAGEAL DILATION;  Surgeon: Malissa Hippo, MD;  Location: [facility] ORS;  Service: Endoscopy;  Laterality: N/AElease Hashimoto 54/56/58  . ESOPHAGOGASTRODUODENOSCOPY N/A 07/17/2014   Procedure: ESOPHAGOGASTRODUODENOSCOPY (EGD);  Surgeon: Malissa Hippo, MD;  [facility];  Service: Endoscopy;  Laterality: N/A;  . ESOPHAGOGASTRODUODENOSCOPY (EGD) WITH PROPOFOL N/A 06/11/2015   Procedure: ESOPHAGOGASTRODUODENOSCOPY (EGD) WITH PROPOFOL;  Surgeon: Malissa Hippo, MD;  Location: [facility] ORS;  Service: Endoscopy;  Laterality: N/A;  730  . HERNIA REPAIR     umbilical   . KNEE SURGERY    . MALONEY DILATION N/A 07/17/2014   Procedure: [name] DILATION;  Surgeon: Malissa Hippo, MD;  [facility];  Service: Endoscopy;  Laterality: N/A;  . UMBILICAL HERNIA REPAIR  2003    Current Facility-Administered Medications  Medication Dose Route Frequency Provider Last Rate Last  Dose  . 0.9 %  sodium chloride infusion   Intravenous Continuous Vin Yonke D, PA-C      . chlorhexidine (HIBICLENS) 4 % liquid 4 application  60 mL Topical Once Edrian Melucci, Oris Drone, PA-C      . lactated ringers infusion   Intravenous Continuous Gaynelle Adu, MD        ALLERGIES:   Allergies  Allergen Reactions  . Amlodipine Besylate Shortness Of Breath  . Morphine And Related Shortness Of Breath and Other (See Comments)    Chest pain  . Amitriptyline Other (See  Comments)    Unknown reaction  . Butorphanol Tartrate Other (See Comments)    REACTION: ED visit and got for migraine - not sure of response  . Compazine [Prochlorperazine Edisylate] Other (See Comments)    Altered mental status  . Rocephin [Ceftriaxone Sodium In Dextrose] Other (See Comments)    Unknown  . Sumatriptan Other (See Comments)    REACTION: HTN    REVIEW OF SYSTEMS:  Review of Systems  Constitutional: Negative for fatigue and fever.  HENT: Negative for ear pain.   Eyes: Negative for pain.  Respiratory: Negative for cough and shortness of breath.   Cardiovascular: Positive for leg swelling.  Gastrointestinal: Negative for constipation and diarrhea.  Genitourinary: Negative for difficulty urinating.  Musculoskeletal: Negative for back pain and neck pain.  Skin: Negative for rash.  Allergic/Immunologic: Negative for food allergies.  Neurological: Positive for weakness. Negative for numbness.  Psychiatric/Behavioral: Positive for suicidal ideas.    FAMILY HISTORY:   Family History  Problem Relation Age of Onset  . Depression Mother   . Cancer Mother        Lung mets (unsure as to what kind)  . Diabetes Mother   . Hypertension Father   . Depression Father   . Heart attack Father   . Depression Daughter   . Anxiety disorder Daughter   . Stroke Daughter   . Transient ischemic attack Brother   . Emphysema Maternal Grandfather   . Brain cancer Paternal Grandmother   . Heart attack Paternal Grandfather     SOCIAL HISTORY:   Social History   Tobacco Use  . Smoking status: Never Smoker  . Smokeless tobacco: Never Used  Substance Use Topics  . Alcohol use: No    Alcohol/week: 0.0 standard drinks      EXAMINATION: Vital signs in last 24 hours: Temp:  [98 F (36.7 C)] 98 F (36.7 C) (09/03 1110) Pulse Rate:  [74] 74 (09/03 1110) Resp:  [18] 18 (09/03 1110) BP: (129)/(80) 129/80 (09/03 1110) SpO2:  [100 %] 100 % (09/03 1110) Weight:  [85.3 kg] 85.3 kg  (09/03 1124)  Head is normocephalic.   Eyes:  Pupils equal, round and reactive to light and accommodation.  Extraocular intact. ENT: Ears, nose, and throat were benign.   Neck: supple, no bruits were noted.   Chest: good expansion.   Lungs: essentially clear.   Cardiac: regular rhythm and rate, normal S1, S2.  grade 3/6 systolic murmur appreciated. Pulses :  1+ bilateral and symmetric in lower extremities. Abdomen is scaphoid, soft, nontender, no masses palpable, normal bowel sounds  present. CNS:  He is oriented x3 and cranial nerves II-XII grossly intact. Breast, rectal, and genital exams: not performed and not indicated for an orthopedic evaluation. Musculoskeletal: Right knee without effusion.  Mild medial lateral joint discomfort.  Negative anterior drawer sign.  Negative Lachman's.  Minimal opening medially with a valgus stress of several millimeters.  Minimal patellar crepitation.  No popliteal mass or fullness.    ASSESSMENT: right medial meniscal tear  Past Medical History:  Diagnosis Date  . Anxiety   . Aortic stenosis    Mild to moderate  . Bicuspid aortic valve   . Essential hypertension, benign   . GERD (gastroesophageal reflux disease)   . History of cardiac catheterization    Normal coronaries 2008  . Hyperlipidemia   . Migraine headache   . MVP (mitral valve prolapse)   . Obesity   . Pain in joint, ankle and foot    Chronic  . Palpitations    Documented PACs by previous monitoring  . Restless leg syndrome   . Skin tag   . Type 2 diabetes mellitus (HCC)   . Unilateral primary osteoarthritis, right knee 09/06/2016    PLAN: Plan for right arthroscopy and medial menisectomy  The procedure,  risks, and benefits of surgery were presented and reviewed. The risks including but not limited to infection, blood clots, vascular and nerve injury, stiffness,  among others were discussed. The patient acknowledged the explanation, agreed to proceed.   Oris DroneBrian D. Aleda Granaetrarca,  PA-C [facility] [phone]  07/02/2018 12:20 PM

## 2018-06-27 NOTE — Progress Notes (Addendum)
Anesthesia Chart Review:   Case:  [id number] Date/Time:  07/02/18 1245   Procedure:  RIGHT KNEE ARTHROSCOPY, DEBRIDEMENT, MEDIAL MENISECTOMY (Right Knee)   Anesthesia type:  General   Pre-op diagnosis:  torn medial meniscus right knee   Location:  [facility]OM 07 / [facility] OR   Surgeon:  Valeria BatmanWhitfield, Peter W, MD      DISCUSSION: Patient is a 57 year old female scheduled for the above procedure. [facility] due to aortic stenosis.  History includes never smoker, HLD, HTN, DM2, palpitations, functionally bicuspid AV with aortic stenosis (at least moderate AS 10/2017), MVP, GERD. Normal coronaries in 2008. Low risk stress test 2019.   She denied any recent chest pain or SOB. Cardiologist Dr. Diona BrownerMcDowell signed a cardiac clearance for this procedure. If no acute changes then I anticipate that she can proceed as planned.   VS: BP 133/67   Pulse 80   Temp 36.8 C   Resp 20   Ht 5\' 5"  (1.651 m)   Wt 87 kg   SpO2 100%   BMI 31.90 kg/m   PROVIDERS: Kirstie PeriShah, Ashish, MD is PCP Nona DellMcDowell, Samuel, MD is cardiologist. Last visit 11/23/17 for chest pain follow-up. Stress test and echo ordered (see below).   LABS: Labs reviewed: Acceptable for surgery. (all labs ordered are listed, but only abnormal results are displayed)  Labs Reviewed  GLUCOSE, CAPILLARY - Abnormal; Notable for the following components:      Result Value   Glucose-Capillary 165 (*)    All other components within normal limits  HEMOGLOBIN A1C - Abnormal; Notable for the following components:   Hgb A1c MFr Bld 6.7 (*)    All other components within normal limits  BASIC METABOLIC PANEL - Abnormal; Notable for the following components:   Glucose, Bld 190 (*)    All other components within normal limits  CBC    EKG: 11/23/17: SR with lead motion artifact. Decreased R wave progression. Non-specific ST changes.    CV: Echo 11/26/17: Study Conclusions - Left ventricle: The cavity size was normal.  Wall thickness was   increased in a pattern of mild LVH. There was mild focal basal   hypertrophy of the septum. Systolic function was vigorous. The   estimated ejection fraction was in the range of 65% to 70%. Wall   motion was normal; there were no regional wall motion   abnormalities. Doppler parameters are consistent with abnormal   left ventricular relaxation (grade 1 diastolic dysfunction). - Aortic valve: Mildly calcified annulus. Functionally bicuspid;   moderately thickened, moderately calcified leaflets. There was   trivial regurgitation. Mean gradient (S): 20 mm Hg. Peak gradient   (S): 46 mm Hg. VTI ratio of LVOT to aortic valve: 0.27. Valve   area (VTI): 0.93 cm^2. Difficult to get accurate valve planimetry   but aortic stenosis looks to be at least in the moderate range.   Gradients have increased compared to the previous study. - Mitral valve: There was trivial regurgitation. - Tricuspid valve: There was trivial regurgitation. - Pulmonary arteries: PA peak pressure: 21 mm Hg (S). - Pericardium, extracardiac: There was no pericardial effusion. Impressions: - Mild LVH with LVEF 65-70% and grade 1 diastolic dysfunction.   Functionally bicuspid aortic valve, thickened and calcified with   trivial aortic regurgitation and at least moderate aortic   stenosis. Trivial tricuspid regurgitation with estimated PASP 21   mm&'s mercury.  Nuclear stress test 11/26/17:  There was no ST  segment deviation noted during stress.  The study is normal. There are no perfusion defects consistent with prior infarction or current ischemia.  This is a low risk study.  The left ventricular ejection fraction is hyperdynamic (>65%).  Cardiac cath 01/24/07: ASSESSMENT:  1. Normal coronary arteries.  2. Normal left ventricular systolic function.  3. Mild dilatation of the aortic root.  4. Mild dilatation of proximal ascending aorta.  5. Likely bicuspid aortic valve.  Carotid U/S  10/26/11: IMPRESSION: Minimal intimal wall thickening within the bilateral carotid bulbs, right greater than left, not resulting in hemodynamically significant stenosis.   Past Medical History:  Diagnosis Date  . Anxiety   . Aortic stenosis    Mild to moderate  . Bicuspid aortic valve   . Essential hypertension, benign   . GERD (gastroesophageal reflux disease)   . History of cardiac catheterization    Normal coronaries 2008  . Hyperlipidemia   . Migraine headache   . MVP (mitral valve prolapse)   . Obesity   . Pain in joint, ankle and foot    Chronic  . Palpitations    Documented PACs by previous monitoring  . Restless leg syndrome   . Skin tag   . Type 2 diabetes mellitus (HCC)   . Unilateral primary osteoarthritis, right knee 09/06/2016    Past Surgical History:  Procedure Laterality Date  . ACHILLES TENDON REPAIR  2003   Left  . CARDIAC CATHETERIZATION  2008   Normal coronary arteries; normal LV systolic function; mild dilation of aortic root; mild dilation of proximal ascending aorta; likely bicuspid aortic valve  . CHOLECYSTECTOMY  2001   "Poor EF at 17%"  . COLONOSCOPY N/A 07/17/2014   Procedure: COLONOSCOPY;  Surgeon: Malissa Hippo, MD;  [facility];  Service: Endoscopy;  Laterality: N/A;  105  . ESOPHAGEAL DILATION N/A 06/11/2015   Procedure: ESOPHAGEAL DILATION;  Surgeon: Malissa Hippo, MD;  Location: [facility];  Service: Endoscopy;  Laterality: N/AElease Hashimoto 54/56/58  . ESOPHAGOGASTRODUODENOSCOPY N/A 07/17/2014   Procedure: ESOPHAGOGASTRODUODENOSCOPY (EGD);  Surgeon: Malissa Hippo, MD;  [facility];  Service: Endoscopy;  Laterality: N/A;  . ESOPHAGOGASTRODUODENOSCOPY (EGD) WITH PROPOFOL N/A 06/11/2015   Procedure: ESOPHAGOGASTRODUODENOSCOPY (EGD) WITH PROPOFOL;  Surgeon: Malissa Hippo, MD;  Location: [facility];  Service: Endoscopy;  Laterality: N/A;  730  . HERNIA REPAIR     umbilical   . KNEE SURGERY    . MALONEY DILATION N/A  07/17/2014   Procedure: [name] DILATION;  Surgeon: Malissa Hippo, MD;  [facility];  Service: Endoscopy;  Laterality: N/A;  . UMBILICAL HERNIA REPAIR  2003    MEDICATIONS: . acetaminophen-codeine (TYLENOL #3) 300-30 MG per tablet  . ALPRAZolam (XANAX) 0.5 MG tablet  . aspirin EC 81 MG tablet  . canagliflozin (INVOKANA) 100 MG TABS tablet  . Diclofenac Potassium 50 MG PACK  . diphenhydrAMINE (BENADRYL) 25 mg capsule  . Fremanezumab-vfrm (AJOVY) 225 MG/1.5ML SOSY  . furosemide (LASIX) 20 MG tablet  . liraglutide (VICTOZA) 18 MG/3ML SOPN  . lisinopril (PRINIVIL,ZESTRIL) 20 MG tablet  . metFORMIN (GLUCOPHAGE-XR) 500 MG 24 hr tablet  . metoprolol (TOPROL-XL) 200 MG 24 hr tablet  . nitroGLYCERIN (NITROSTAT) 0.4 MG SL tablet  . polyethylene glycol (MIRALAX / GLYCOLAX) packet  . potassium chloride SA (K-DUR,KLOR-CON) 20 MEQ tablet  . simvastatin (ZOCOR) 40 MG tablet   No current facility-administered medications for this encounter.   She is to follow perioperative ASA instructions  as provided from her surgeon.   Velna Ochs [facility] Short Stay Center/Anesthesiology Phone [phone] 06/27/2018 3:52 PM

## 2018-07-02 ENCOUNTER — Encounter (HOSPITAL_COMMUNITY)
Admission: RE | Disposition: A | Discharge status: Home / Self Care | Payer: Self-pay | Source: Ambulatory Visit | Attending: Orthopaedic Surgery

## 2018-07-02 ENCOUNTER — Encounter (HOSPITAL_COMMUNITY): Payer: Self-pay | Admitting: *Deleted

## 2018-07-02 ENCOUNTER — Ambulatory Visit (HOSPITAL_COMMUNITY): Payer: PRIVATE HEALTH INSURANCE | Admitting: Certified Registered"

## 2018-07-02 ENCOUNTER — Encounter: Payer: Self-pay | Admitting: Orthopaedic Surgery

## 2018-07-02 ENCOUNTER — Ambulatory Visit (HOSPITAL_COMMUNITY): Payer: PRIVATE HEALTH INSURANCE | Admitting: Vascular Surgery

## 2018-07-02 ENCOUNTER — Ambulatory Visit (HOSPITAL_COMMUNITY)
Admission: RE | Admit: 2018-07-02 | Discharge: 2018-07-02 | Disposition: A | Discharge status: Home / Self Care | Payer: PRIVATE HEALTH INSURANCE | Source: Ambulatory Visit | Attending: Orthopaedic Surgery | Admitting: Orthopaedic Surgery

## 2018-07-02 DIAGNOSIS — E1169 Type 2 diabetes mellitus with other specified complication: Secondary | ICD-10-CM

## 2018-07-02 DIAGNOSIS — X58XXXA Exposure to other specified factors, initial encounter: Secondary | ICD-10-CM | POA: Diagnosis not present

## 2018-07-02 DIAGNOSIS — E119 Type 2 diabetes mellitus without complications: Secondary | ICD-10-CM | POA: Insufficient documentation

## 2018-07-02 DIAGNOSIS — E669 Obesity, unspecified: Secondary | ICD-10-CM

## 2018-07-02 DIAGNOSIS — Z79899 Other long term (current) drug therapy: Secondary | ICD-10-CM | POA: Insufficient documentation

## 2018-07-02 DIAGNOSIS — S83281A Other tear of lateral meniscus, current injury, right knee, initial encounter: Secondary | ICD-10-CM | POA: Diagnosis present

## 2018-07-02 DIAGNOSIS — I10 Essential (primary) hypertension: Secondary | ICD-10-CM | POA: Insufficient documentation

## 2018-07-02 DIAGNOSIS — K219 Gastro-esophageal reflux disease without esophagitis: Secondary | ICD-10-CM | POA: Diagnosis not present

## 2018-07-02 DIAGNOSIS — M94261 Chondromalacia, right knee: Secondary | ICD-10-CM

## 2018-07-02 DIAGNOSIS — M1711 Unilateral primary osteoarthritis, right knee: Secondary | ICD-10-CM | POA: Diagnosis present

## 2018-07-02 DIAGNOSIS — Z7984 Long term (current) use of oral hypoglycemic drugs: Secondary | ICD-10-CM | POA: Insufficient documentation

## 2018-07-02 DIAGNOSIS — F419 Anxiety disorder, unspecified: Secondary | ICD-10-CM | POA: Diagnosis not present

## 2018-07-02 DIAGNOSIS — Y929 Unspecified place or not applicable: Secondary | ICD-10-CM | POA: Diagnosis not present

## 2018-07-02 DIAGNOSIS — M23261 Derangement of other lateral meniscus due to old tear or injury, right knee: Secondary | ICD-10-CM

## 2018-07-02 DIAGNOSIS — M1710 Unilateral primary osteoarthritis, unspecified knee: Secondary | ICD-10-CM | POA: Diagnosis present

## 2018-07-02 DIAGNOSIS — M179 Osteoarthritis of knee, unspecified: Secondary | ICD-10-CM | POA: Diagnosis present

## 2018-07-02 DIAGNOSIS — M2241 Chondromalacia patellae, right knee: Secondary | ICD-10-CM | POA: Diagnosis not present

## 2018-07-02 HISTORY — PX: KNEE ARTHROSCOPY WITH MEDIAL MENISECTOMY: SHX5651

## 2018-07-02 LAB — GLUCOSE, CAPILLARY
GLUCOSE-CAPILLARY: 151 mg/dL — AB (ref 70–99)
Glucose-Capillary: 105 mg/dL — ABNORMAL HIGH (ref 70–99)

## 2018-07-02 SURGERY — ARTHROSCOPY, KNEE, WITH MEDIAL MENISCECTOMY
Anesthesia: General | Site: Knee | Laterality: Right

## 2018-07-02 MED ORDER — SUCCINYLCHOLINE CHLORIDE 200 MG/10ML IV SOSY
PREFILLED_SYRINGE | INTRAVENOUS | Status: AC
  Filled 2018-07-02: qty 10

## 2018-07-02 MED ORDER — FENTANYL CITRATE (PF) 250 MCG/5ML IJ SOLN
INTRAMUSCULAR | Status: DC | PRN
  Administered 2018-07-02: 25 ug via INTRAVENOUS
  Administered 2018-07-02: 50 ug via INTRAVENOUS

## 2018-07-02 MED ORDER — SODIUM CHLORIDE 0.9 % IV SOLN
INTRAVENOUS | Status: DC | PRN
  Administered 2018-07-02: 25 ug/min via INTRAVENOUS

## 2018-07-02 MED ORDER — FENTANYL CITRATE (PF) 250 MCG/5ML IJ SOLN
INTRAMUSCULAR | Status: AC
  Filled 2018-07-02: qty 5

## 2018-07-02 MED ORDER — ONDANSETRON HCL 4 MG/2ML IJ SOLN
INTRAMUSCULAR | Status: DC | PRN
  Administered 2018-07-02: 4 mg via INTRAVENOUS

## 2018-07-02 MED ORDER — LIDOCAINE 2% (20 MG/ML) 5 ML SYRINGE
INTRAMUSCULAR | Status: DC | PRN
  Administered 2018-07-02: 80 mg via INTRAVENOUS

## 2018-07-02 MED ORDER — ASPIRIN EC 81 MG PO TBEC: 81.0000 mg | DELAYED_RELEASE_TABLET | Freq: Every day | ORAL | Status: DC

## 2018-07-02 MED ORDER — OXYCODONE HCL 5 MG PO TABS: 5.0000 mg | ORAL_TABLET | ORAL | 0 refills | Status: DC | PRN

## 2018-07-02 MED ORDER — DEXAMETHASONE SODIUM PHOSPHATE 10 MG/ML IJ SOLN
INTRAMUSCULAR | Status: AC
  Filled 2018-07-02: qty 1

## 2018-07-02 MED ORDER — LIDOCAINE 2% (20 MG/ML) 5 ML SYRINGE
INTRAMUSCULAR | Status: AC
  Filled 2018-07-02: qty 5

## 2018-07-02 MED ORDER — SODIUM CHLORIDE 0.9 % IR SOLN
Status: DC | PRN
  Administered 2018-07-02: 3000 mL

## 2018-07-02 MED ORDER — MIDAZOLAM HCL 2 MG/2ML IJ SOLN
INTRAMUSCULAR | Status: AC
  Filled 2018-07-02: qty 2

## 2018-07-02 MED ORDER — DEXAMETHASONE SODIUM PHOSPHATE 10 MG/ML IJ SOLN
INTRAMUSCULAR | Status: DC | PRN
  Administered 2018-07-02: 5 mg via INTRAVENOUS

## 2018-07-02 MED ORDER — EPHEDRINE SULFATE 50 MG/ML IJ SOLN
INTRAMUSCULAR | Status: DC | PRN
  Administered 2018-07-02 (×2): 5 mg via INTRAVENOUS

## 2018-07-02 MED ORDER — ONDANSETRON HCL 4 MG/2ML IJ SOLN
INTRAMUSCULAR | Status: AC
  Filled 2018-07-02: qty 2

## 2018-07-02 MED ORDER — MIDAZOLAM HCL 5 MG/5ML IJ SOLN
INTRAMUSCULAR | Status: DC | PRN
  Administered 2018-07-02: 2 mg via INTRAVENOUS

## 2018-07-02 MED ORDER — BUPIVACAINE-EPINEPHRINE 0.25% -1:200000 IJ SOLN
INTRAMUSCULAR | Status: DC | PRN
  Administered 2018-07-02: 30 mL

## 2018-07-02 MED ORDER — LACTATED RINGERS IV SOLN
INTRAVENOUS | Status: DC
  Administered 2018-07-02: 13:00:00 via INTRAVENOUS

## 2018-07-02 MED ORDER — SODIUM CHLORIDE 0.9 % IV SOLN: INTRAVENOUS | Status: DC

## 2018-07-02 MED ORDER — LIDOCAINE-EPINEPHRINE 1 %-1:100000 IJ SOLN
INTRAMUSCULAR | Status: AC
  Filled 2018-07-02: qty 1

## 2018-07-02 MED ORDER — CHLORHEXIDINE GLUCONATE 4 % EX LIQD: 60.0000 mL | Freq: Once | CUTANEOUS | Status: DC

## 2018-07-02 MED ORDER — HYDROMORPHONE HCL 1 MG/ML IJ SOLN: 0.2500 mg | INTRAMUSCULAR | Status: DC | PRN

## 2018-07-02 MED ORDER — LIDOCAINE-EPINEPHRINE (PF) 1 %-1:200000 IJ SOLN
INTRAMUSCULAR | Status: DC | PRN
  Administered 2018-07-02: 20 mL

## 2018-07-02 MED ORDER — PROPOFOL 10 MG/ML IV BOLUS
INTRAVENOUS | Status: AC
  Filled 2018-07-02: qty 20

## 2018-07-02 MED ORDER — BUPIVACAINE-EPINEPHRINE (PF) 0.25% -1:200000 IJ SOLN
INTRAMUSCULAR | Status: AC
  Filled 2018-07-02: qty 30

## 2018-07-02 MED ORDER — ROCURONIUM BROMIDE 50 MG/5ML IV SOSY
PREFILLED_SYRINGE | INTRAVENOUS | Status: AC
  Filled 2018-07-02: qty 5

## 2018-07-02 MED ORDER — PROPOFOL 10 MG/ML IV BOLUS
INTRAVENOUS | Status: DC | PRN
  Administered 2018-07-02: 30 mg via INTRAVENOUS
  Administered 2018-07-02: 120 mg via INTRAVENOUS

## 2018-07-02 SURGICAL SUPPLY — 33 items
BANDAGE ACE 6X5 VEL STRL LF (GAUZE/BANDAGES/DRESSINGS) ×2 IMPLANT
BLADE CUDA 5.5 (BLADE) IMPLANT
BLADE GREAT WHITE 4.2 (BLADE) ×2 IMPLANT
BLADE GREAT WHITE 4.2MM (BLADE) ×1
BUR OVAL 6.0 (BURR) IMPLANT
CUFF TOURNIQUET SINGLE 34IN LL (TOURNIQUET CUFF) IMPLANT
CUFF TOURNIQUET SINGLE 44IN (TOURNIQUET CUFF) IMPLANT
DRAPE ARTHROSCOPY W/POUCH 114 (DRAPES) ×3 IMPLANT
DRSG EMULSION OIL 3X3 NADH (GAUZE/BANDAGES/DRESSINGS) ×2 IMPLANT
DURAPREP 26ML APPLICATOR (WOUND CARE) ×3 IMPLANT
EXCALIBUR 3.8MM X 13CM (MISCELLANEOUS) ×2 IMPLANT
GAUZE SPONGE 4X4 12PLY STRL (GAUZE/BANDAGES/DRESSINGS) ×2 IMPLANT
GLOVE BIOGEL PI IND STRL 8 (GLOVE) ×1 IMPLANT
GLOVE BIOGEL PI IND STRL 8.5 (GLOVE) ×1 IMPLANT
GLOVE BIOGEL PI INDICATOR 8 (GLOVE) ×2
GLOVE BIOGEL PI INDICATOR 8.5 (GLOVE) ×2
GLOVE ECLIPSE 8.0 STRL XLNG CF (GLOVE) ×3 IMPLANT
GLOVE ECLIPSE 8.5 STRL (GLOVE) ×3 IMPLANT
GOWN STRL REUS W/ TWL LRG LVL3 (GOWN DISPOSABLE) ×2 IMPLANT
GOWN STRL REUS W/ TWL XL LVL3 (GOWN DISPOSABLE) ×1 IMPLANT
GOWN STRL REUS W/TWL LRG LVL3 (GOWN DISPOSABLE) ×6
GOWN STRL REUS W/TWL XL LVL3 (GOWN DISPOSABLE) ×3
KIT TURNOVER KIT B (KITS) ×3 IMPLANT
MANIFOLD NEPTUNE II (INSTRUMENTS) ×3 IMPLANT
PACK ARTHROSCOPY DSU (CUSTOM PROCEDURE TRAY) ×3 IMPLANT
PAD ARMBOARD 7.5X6 YLW CONV (MISCELLANEOUS) ×6 IMPLANT
PORT APPOLLO RF 90DEGREE MULTI (SURGICAL WAND) ×2 IMPLANT
SET ARTHROSCOPY TUBING (MISCELLANEOUS) ×3
SET ARTHROSCOPY TUBING LN (MISCELLANEOUS) ×1 IMPLANT
SPONGE LAP 4X18 RFD (DISPOSABLE) ×3 IMPLANT
TOWEL OR 17X24 6PK STRL BLUE (TOWEL DISPOSABLE) ×6 IMPLANT
WAND HAND CNTRL MULTIVAC 90 (MISCELLANEOUS) ×3 IMPLANT
WATER STERILE IRR 1000ML POUR (IV SOLUTION) ×3 IMPLANT

## 2018-07-02 NOTE — Anesthesia Preprocedure Evaluation (Signed)
Anesthesia Evaluation  Patient identified by MRN, date of birth, ID band Patient awake    Reviewed: Allergy & Precautions, H&P , NPO status , Patient's Chart, lab work & pertinent test results, reviewed documented beta blocker date and time   Airway Mallampati: II  TM Distance: >3 FB Neck ROM: Full    Dental no notable dental hx. (+) Partial Lower, Partial Upper, Dental Advisory Given   Pulmonary neg pulmonary ROS,    Pulmonary exam normal breath sounds clear to auscultation       Cardiovascular hypertension, Pt. on medications and Pt. on home beta blockers + Valvular Problems/Murmurs AS  Rhythm:Regular Rate:Normal     Neuro/Psych  Headaches, Anxiety    GI/Hepatic Neg liver ROS, GERD  Controlled,  Endo/Other  diabetes, Type 2, Oral Hypoglycemic Agents  Renal/GU negative Renal ROS  negative genitourinary   Musculoskeletal  (+) Arthritis , Osteoarthritis,    Abdominal   Peds  Hematology negative hematology ROS (+)   Anesthesia Other Findings   Reproductive/Obstetrics negative OB ROS                             Anesthesia Physical Anesthesia Plan  ASA: III  Anesthesia Plan: General   Post-op Pain Management:    Induction: Intravenous  PONV Risk Score and Plan: 4 or greater and Ondansetron, Dexamethasone and Midazolam  Airway Management Planned: LMA  Additional Equipment:   Intra-op Plan:   Post-operative Plan: Extubation in OR  Informed Consent: I have reviewed the patients History and Physical, chart, labs and discussed the procedure including the risks, benefits and alternatives for the proposed anesthesia with the patient or authorized representative who has indicated his/her understanding and acceptance.   Dental advisory given  Plan Discussed with: CRNA  Anesthesia Plan Comments:         Anesthesia Quick Evaluation

## 2018-07-02 NOTE — Op Note (Signed)
NAMEGREYSON, Stacy Frost MEDICAL RECORD [id number] ACCOUNT [id number] DATE OF BIRTH:16-Jul-1961 FACILITY: [facility] LOCATION: [place], [place]  OPERATIVE REPORT  DATE OF PROCEDURE:  07/02/2018  PREOPERATIVE DIAGNOSES:   1.  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   2.  Old tear of anterior cruciate ligament.  POSTOPERATIVE DIAGNOSES:   1.  Tear of lateral meniscal root. 2.  Degenerative arthritis, medial compartment and patellofemoral compartments.   3.  Old tear of anterior cruciate ligament.  PROCEDURE: 1.  Diagnostic arthroscopy, right knee. 2.  Partial lateral meniscectomy. 3.  Chondroplasty of patellofemoral joint and medial femoral condyle.  SURGEON:  Joni Fears, MD  ASSISTANT:  Biagio Borg, PA-C  ANESTHESIA:  General with local Xylocaine with epinephrine.  ESTIMATED BLOOD LOSS:  None.  HISTORY:  This 57 year old female is 3 years status post successful right knee arthroscopy.  She had a partial medial meniscectomy.  At that time, she was noted to have some laxity of her anterior cruciate ligament despite the fact that it appeared to be  intact.  She has had some pain off and on over the past several years that of late has not responded to local cortisone injections and over-the-counter meds.  She has tried exercises.  I have obtained an MRI scan demonstrating tears of the medial and  lateral menisci as well as degenerative arthritis of the patellofemoral joint and medial compartments.  Because of her poor response to conservative treatment, she now would like to proceed with arthroscopy.  DESCRIPTION OF PROCEDURE:  The patient was met with her family in the holding area and identified the right knee as appropriate operative site and marked it accordingly.  The patient was then transported to room 7.  She was carefully placed on the  operating table.   Anesthesia performed general laryngeal anesthesia without difficulty.  The right lower extremity was then placed in a thigh holder.  The leg was then prepped with DuraPrep in a thigh holder of the ankle.  Sterile draping was performed.  A timeout was called.  The joint was then infiltrated with 0.25% Marcaine with epinephrine and Xylocaine with epinephrine.  Two puncture holes were then made for his arthroscopic portals on either side of the patellar tendon at the level of the joint line.  Diagnostic  arthroscopy was performed through the lateral portal.  There was no significant effusion.  There was very minimal synovitis.  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  There was a moderate  amount of loose articular cartilage in the trochlea, which was debrided with a Cuda shaver.  There was a small medial shelf plica that did not appear to be inflammatory or symptomatic.  In the lateral compartment, there was grade I changes of chondromalacia.  There was a tear of the lateral root.  This was debrided with a Cuda shaver and then stabilized with the Arthrex wand.  There were no loose bodies.  In the intercondylar notch, the ACL appeared to have an old tear from its attachment to the femur.  Otherwise, still intact with a nice sheath, but I could obtain an anterior drawer sign.  Clinically, the patient was unstable with clinical exams.  In the  medial compartment, there was an old tear of the posterior horn of the medial meniscus, but it was otherwise intact.  There was grade III and IV changes of chondromalacia  diffusely on the femoral condyle, grade III changes on the tibia.  No loose  material.  Shaving of the femur was performed.  The joint was then explored without evidence of loose material puncture sites left open and infiltrated 0.25% Marcaine with epinephrine.  Sterile bulky dressing was applied followed by an Ace bandage.  PLAN:   OxyIR for pain, office next week.  TN/NUANCE  D:07/02/2018 T:07/02/2018 JOB:[id number]

## 2018-07-02 NOTE — Transfer of Care (Signed)
Immediate Anesthesia Transfer of Care Note  Patient: Jannine Aria  Procedure(s) Performed: RIGHT KNEE ARTHROSCOPY, DEBRIDEMENT, MEDIAL MENISECTOMY (Right Knee)  Patient Location: PACU  Anesthesia Type:General  Level of Consciousness: drowsy and patient cooperative  Airway & Oxygen Therapy: Patient Spontanous Breathing and Patient connected to face mask oxygen  Post-op Assessment: Report given to RN and Post -op Vital signs reviewed and stable  Post vital signs: Reviewed and stable  Last Vitals:  Vitals Value Taken Time  BP    Temp    Pulse 73 07/02/2018  1:38 PM  Resp    SpO2 100 % 07/02/2018  1:38 PM  Vitals shown include unvalidated device data.  Last Pain:  Vitals:   07/02/18 1124  TempSrc:   PainSc: 0-No pain         Complications: No apparent anesthesia complications

## 2018-07-02 NOTE — Progress Notes (Signed)
PATIENT ID:      Stacy Frost  MRN:     [id number] DOB/AGE:    [birth year] / 58 y.o.       OPERATIVE REPORT    DATE OF PROCEDURE:  07/02/2018       PREOPERATIVE DIAGNOSIS:   Tear medial and lateral  meniscus right knee with degenerative arthritis medial and patello-femoral compartments with old partial tear ACL.                                                       Estimated body mass index is 31.28 kg/m as calculated from the following:   Height as of this encounter: 5\' 5"  (1.651 m).   Weight as of this encounter: 85.3 kg.     POSTOPERATIVE DIAGNOSIS:   Tear lateral meniscus right knee with degenerative arthritis meduial and patello-femoral compartments, old tear ACL                                                                    Estimated body mass index is 31.28 kg/m as calculated from the following:   Height as of this encounter: 5\' 5"  (1.651 m).   Weight as of this encounter: 85.3 kg.     PROCEDURE:  Procedure(s): RIGHT KNEE ARTHROSCOPY RIGHT KNEE WITH PARTIAL LATERAL MENISECTOMY, CHONDROPLASTY MEDIAL AND PATELLO-FEMORAL COMPARTMENTS      SURGEON:  Norlene Campbell, MD    ASSISTANT:   Jacqualine Code, PA-C   (Present and scrubbed throughout the case, critical for assistance with exposure, retraction, instrumentation, and closure.)          ANESTHESIA: local and general     DRAINS: none :      TOURNIQUET TIME: * No tourniquets in log *    COMPLICATIONS:  None   CONDITION:  stable  PROCEDURE IN DETAIL: [id number]   [name] 07/02/2018, 1:27 PM  Patient ID: Stacy Frost, female   DOB: 08/31/1961, 57 y.o.   MRN: [id number]

## 2018-07-02 NOTE — Anesthesia Postprocedure Evaluation (Signed)
Anesthesia Post Note  Patient: Stacy Frost  Procedure(s) Performed: RIGHT KNEE ARTHROSCOPY, DEBRIDEMENT, MEDIAL MENISECTOMY (Right Knee)     Patient location during evaluation: PACU Anesthesia Type: General Level of consciousness: awake and alert Pain management: pain level controlled Vital Signs Assessment: post-procedure vital signs reviewed and stable Respiratory status: spontaneous breathing, nonlabored ventilation and respiratory function stable Cardiovascular status: blood pressure returned to baseline and stable Postop Assessment: no apparent nausea or vomiting Anesthetic complications: no    Last Vitals:  Vitals:   07/02/18 1409 07/02/18 1415  BP: (!) 108/58   Pulse:  66  Resp:  16  Temp:  (!) 36.2 C  SpO2:  98%    Last Pain:  Vitals:   07/02/18 1415  TempSrc:   PainSc: 0-No pain                 Kourtlyn Charlet,W. EDMOND

## 2018-07-02 NOTE — Discharge Instructions (Signed)

## 2018-07-02 NOTE — Anesthesia Procedure Notes (Signed)
Procedure Name: LMA Insertion Date/Time: 07/02/2018 12:52 PM Performed by: Ponciano Ort, CRNA Pre-anesthesia Checklist: Patient identified, Emergency Drugs available, Suction available and Patient being monitored Patient Re-evaluated:Patient Re-evaluated prior to induction Oxygen Delivery Method: Circle system utilized Preoxygenation: Pre-oxygenation with 100% oxygen Induction Type: IV induction Ventilation: Mask ventilation without difficulty and Mask ventilation throughout procedure LMA: LMA inserted LMA Size: 4.0 Number of attempts: 1 Placement Confirmation: ETT inserted through vocal cords under direct vision,  positive ETCO2 and breath sounds checked- equal and bilateral Tube secured with: Tape Dental Injury: Teeth and Oropharynx as per pre-operative assessment

## 2018-07-02 NOTE — Telephone Encounter (Signed)
Patient currently admitted

## 2018-07-03 ENCOUNTER — Encounter (HOSPITAL_COMMUNITY): Payer: Self-pay | Admitting: Orthopaedic Surgery

## 2018-07-03 ENCOUNTER — Inpatient Hospital Stay (INDEPENDENT_AMBULATORY_CARE_PROVIDER_SITE_OTHER): Payer: PRIVATE HEALTH INSURANCE | Admitting: Orthopaedic Surgery

## 2018-07-10 ENCOUNTER — Ambulatory Visit (INDEPENDENT_AMBULATORY_CARE_PROVIDER_SITE_OTHER): Payer: PRIVATE HEALTH INSURANCE | Admitting: Orthopaedic Surgery

## 2018-07-10 ENCOUNTER — Encounter (INDEPENDENT_AMBULATORY_CARE_PROVIDER_SITE_OTHER): Payer: Self-pay | Admitting: Orthopaedic Surgery

## 2018-07-10 DIAGNOSIS — M25561 Pain in right knee: Secondary | ICD-10-CM

## 2018-07-10 DIAGNOSIS — G8929 Other chronic pain: Secondary | ICD-10-CM

## 2018-07-10 NOTE — Progress Notes (Signed)
Office Visit Note   Patient: Stacy Frost           Date of Birth: 07-24-1961           MRN: [id number] Visit Date: 07/10/2018              Requested by: Kirstie Peri, MD [address] [place], [place] [address] PCP: Kirstie Peri, MD   Assessment & Plan: Visit Diagnoses:  1. Chronic pain of right knee     Plan: Days status post right knee arthroscopy with chondroplasty vision a scar plica.  Doing very well.  Taking minimal pain medicine.  Not using any ambulatory aid.  No shortness of breath or chest pain.  No calf pain.  Continue activity as tolerated and return in 3 weeks.  Follow-Up Instructions: Return in about 3 weeks (around 07/31/2018).   Orders:  No orders of the defined types were placed in this encounter.  No orders of the defined types were placed in this encounter.     Procedures: No procedures performed   Clinical Data: No additional findings.   Subjective: No chief complaint on file. 8 days status post right knee arthroscopy and doing well.  No related complaints.  No calf pain.  No distal edema.  Redness of breath or chest pain.  Not using any ambulatory aid.  Minimal use of pain medicine.  She does not work so she does not need a work note HPI  Review of Systems   Objective: Vital Signs: There were no vitals taken for this visit.  Physical Exam  Ortho Exam knee without effusion.  Arthroscopic portals healing without a problem.  No calf pain still edema.  Full extension flexed over 100 degrees without instability.  No significant medial lateral joint tenderness.  Right well  Specialty Comments:  No specialty comments available.  Imaging: No results found.   PMFS History: Patient Active Problem List   Diagnosis Date Noted  . Unilateral primary osteoarthritis, right knee 07/02/2018  . Osteoarthritis of knee 09/06/2016  . Dysphagia 06/23/2014  . Nausea 06/23/2014  . Weight loss 06/23/2014  . Abdominal pain, chronic, epigastric 09/05/2013  .  Preoperative evaluation to rule out surgical contraindication 07/12/2012  . HYPERLIPIDEMIA 06/29/2008  . Essential hypertension, benign 06/01/2008  . Bicuspid aortic valve 06/01/2008   Past Medical History:  Diagnosis Date  . Anxiety   . Aortic stenosis    Mild to moderate  . Bicuspid aortic valve   . Essential hypertension, benign   . GERD (gastroesophageal reflux disease)   . History of cardiac catheterization    Normal coronaries 2008  . Hyperlipidemia   . Migraine headache   . MVP (mitral valve prolapse)   . Obesity   . Pain in joint, ankle and foot    Chronic  . Palpitations    Documented PACs by previous monitoring  . Restless leg syndrome   . Skin tag   . Type 2 diabetes mellitus (HCC)   . Unilateral primary osteoarthritis, right knee 09/06/2016    Family History  Problem Relation Age of Onset  . Depression Mother   . Cancer Mother        Lung mets (unsure as to what kind)  . Diabetes Mother   . Hypertension Father   . Depression Father   . Heart attack Father   . Depression Daughter   . Anxiety disorder Daughter   . Stroke Daughter   . Transient ischemic attack Brother   . Emphysema Maternal Grandfather   .  Brain cancer Paternal Grandmother   . Heart attack Paternal Grandfather     Past Surgical History:  Procedure Laterality Date  . ACHILLES TENDON REPAIR  2003   Left  . CARDIAC CATHETERIZATION  2008   Normal coronary arteries; normal LV systolic function; mild dilation of aortic root; mild dilation of proximal ascending aorta; likely bicuspid aortic valve  . CHOLECYSTECTOMY  2001   "Poor EF at 17%"  . COLONOSCOPY N/A 07/17/2014   Procedure: COLONOSCOPY;  Surgeon: Malissa Hippo, MD;  Location: [facility];  Service: Endoscopy;  Laterality: N/A;  105  . ESOPHAGEAL DILATION N/A 06/11/2015   Procedure: ESOPHAGEAL DILATION;  Surgeon: Malissa Hippo, MD;  Location: [facility];  Service: Endoscopy;  Laterality: N/AElease Hashimoto 54/56/58  .  ESOPHAGOGASTRODUODENOSCOPY N/A 07/17/2014   Procedure: ESOPHAGOGASTRODUODENOSCOPY (EGD);  Surgeon: Malissa Hippo, MD;  Location: [facility];  Service: Endoscopy;  Laterality: N/A;  . ESOPHAGOGASTRODUODENOSCOPY (EGD) WITH PROPOFOL N/A 06/11/2015   Procedure: ESOPHAGOGASTRODUODENOSCOPY (EGD) WITH PROPOFOL;  Surgeon: Malissa Hippo, MD;  Location: [facility];  Service: Endoscopy;  Laterality: N/A;  730  . HERNIA REPAIR     umbilical   . KNEE ARTHROSCOPY WITH MEDIAL MENISECTOMY Right 07/02/2018   Procedure: RIGHT KNEE ARTHROSCOPY, DEBRIDEMENT, MEDIAL MENISECTOMY;  Surgeon: [name], MD;  Location: [facility];  Service: Orthopedics;  Laterality: Right;  . KNEE SURGERY    . MALONEY DILATION N/A 07/17/2014   Procedure: [name] DILATION;  Surgeon: Malissa Hippo, MD;  Location: [facility];  Service: Endoscopy;  Laterality: N/A;  . UMBILICAL HERNIA REPAIR  2003   Social History   Occupational History  . Occupation: Home maker  Tobacco Use  . Smoking status: Never Smoker  . Smokeless tobacco: Never Used  Substance and Sexual Activity  . Alcohol use: No    Alcohol/week: 0.0 standard drinks  . Drug use: No  . Sexual activity: Not on file     [name], MD   Note - This record has been created using [employer].  Chart creation errors have been sought, but may not always  have been located. Such creation errors do not reflect on  the standard of medical care.

## 2018-07-31 ENCOUNTER — Ambulatory Visit (INDEPENDENT_AMBULATORY_CARE_PROVIDER_SITE_OTHER): Payer: PRIVATE HEALTH INSURANCE | Admitting: Orthopaedic Surgery

## 2018-07-31 ENCOUNTER — Encounter (INDEPENDENT_AMBULATORY_CARE_PROVIDER_SITE_OTHER): Payer: Self-pay | Admitting: Orthopaedic Surgery

## 2018-07-31 VITALS — BP 87/55 | HR 66 | Ht 65.0 in | Wt 185.0 lb

## 2018-07-31 DIAGNOSIS — M1711 Unilateral primary osteoarthritis, right knee: Secondary | ICD-10-CM

## 2018-07-31 NOTE — Progress Notes (Signed)
Office Visit Note   Patient: Stacy Frost           Date of Birth: 02/25/1961           MRN: [id number] Visit Date: 07/31/2018              Requested by: Kirstie Peri, MD [address] [place], [place] [address] PCP: Kirstie Peri, MD   Assessment & Plan: Visit Diagnoses:  1. Unilateral primary osteoarthritis, right knee     Plan: 1 month status post right knee arthroscopy and doing exceptionally well.  No issues.  Not taking any medicines for her knee.  Will encourage exercises and return as needed  Follow-Up Instructions: Return if symptoms worsen or fail to improve.   Orders:  No orders of the defined types were placed in this encounter.  No orders of the defined types were placed in this encounter.     Procedures: No procedures performed   Clinical Data: No additional findings.   Subjective: Chief Complaint  Patient presents with  . Follow-up    07/02/18 R KNEE SCOPE THINGS GOING GREAT JUST HAS A LITTLE TROUBLE SQUATING  Kharlie relates that she is doing very well.  No related problems.  Very happy with the knee arthroscopy.  Not taking any medicines.  Does not work and ,therefore, does not need any notes  HPI  Review of Systems  Constitutional: Negative for fatigue and fever.  Eyes: Negative for pain.  Respiratory: Negative for cough and shortness of breath.   Cardiovascular: Negative for leg swelling.  Gastrointestinal: Negative for constipation and diarrhea.  Genitourinary: Negative for difficulty urinating.  Musculoskeletal: Negative for back pain and neck pain.  Skin: Negative for rash.  Allergic/Immunologic: Negative for food allergies.  Neurological: Negative for weakness and numbness.  Hematological: Does not bruise/bleed easily.  Psychiatric/Behavioral: Negative for sleep disturbance.     Objective: Vital Signs: BP (!) 87/55 (BP Location: Left Arm, Patient Position: Sitting, Cuff Size: Normal)   Pulse 66   Ht 5\' 5"  (1.651 m)   Wt 185 lb (83.9 kg)   BMI  30.79 kg/m   Physical Exam  Ortho Exam right knee without effusion.  No medial or lateral joint pain.  Full extension and flexion.  No limp.  No instability.  Knee was not hot warm or red.  No localized areas of tenderness  Specialty Comments:  No specialty comments available.  Imaging: No results found.   PMFS History: Patient Active Problem List   Diagnosis Date Noted  . Unilateral primary osteoarthritis, right knee 07/02/2018  . Osteoarthritis of knee 09/06/2016  . Dysphagia 06/23/2014  . Nausea 06/23/2014  . Weight loss 06/23/2014  . Abdominal pain, chronic, epigastric 09/05/2013  . Preoperative evaluation to rule out surgical contraindication 07/12/2012  . HYPERLIPIDEMIA 06/29/2008  . Essential hypertension, benign 06/01/2008  . Bicuspid aortic valve 06/01/2008   Past Medical History:  Diagnosis Date  . Anxiety   . Aortic stenosis    Mild to moderate  . Bicuspid aortic valve   . Essential hypertension, benign   . GERD (gastroesophageal reflux disease)   . History of cardiac catheterization    Normal coronaries 2008  . Hyperlipidemia   . Migraine headache   . MVP (mitral valve prolapse)   . Obesity   . Pain in joint, ankle and foot    Chronic  . Palpitations    Documented PACs by previous monitoring  . Restless leg syndrome   . Skin tag   .  Type 2 diabetes mellitus (HCC)   . Unilateral primary osteoarthritis, right knee 09/06/2016    Family History  Problem Relation Age of Onset  . Depression Mother   . Cancer Mother        Lung mets (unsure as to what kind)  . Diabetes Mother   . Hypertension Father   . Depression Father   . Heart attack Father   . Depression Daughter   . Anxiety disorder Daughter   . Stroke Daughter   . Transient ischemic attack Brother   . Emphysema Maternal Grandfather   . Brain cancer Paternal Grandmother   . Heart attack Paternal Grandfather     Past Surgical History:  Procedure Laterality Date  . ACHILLES TENDON REPAIR   2003   Left  . CARDIAC CATHETERIZATION  2008   Normal coronary arteries; normal LV systolic function; mild dilation of aortic root; mild dilation of proximal ascending aorta; likely bicuspid aortic valve  . CHOLECYSTECTOMY  2001   "Poor EF at 17%"  . COLONOSCOPY N/A 07/17/2014   Procedure: COLONOSCOPY;  Surgeon: Malissa Hippo, MD;  [facility];  Service: Endoscopy;  Laterality: N/A;  105  . ESOPHAGEAL DILATION N/A 06/11/2015   Procedure: ESOPHAGEAL DILATION;  Surgeon: Malissa Hippo, MD;  Location: [facility];  Service: Endoscopy;  Laterality: N/AElease Hashimoto 54/56/58  . ESOPHAGOGASTRODUODENOSCOPY N/A 07/17/2014   Procedure: ESOPHAGOGASTRODUODENOSCOPY (EGD);  Surgeon: Malissa Hippo, MD;  [facility];  Service: Endoscopy;  Laterality: N/A;  . ESOPHAGOGASTRODUODENOSCOPY (EGD) WITH PROPOFOL N/A 06/11/2015   Procedure: ESOPHAGOGASTRODUODENOSCOPY (EGD) WITH PROPOFOL;  Surgeon: Malissa Hippo, MD;  Location: [facility];  Service: Endoscopy;  Laterality: N/A;  730  . HERNIA REPAIR     umbilical   . KNEE ARTHROSCOPY WITH MEDIAL MENISECTOMY Right 07/02/2018   Procedure: RIGHT KNEE ARTHROSCOPY, DEBRIDEMENT, MEDIAL MENISECTOMY;  Surgeon: [name], MD;  Location: [facility];  Service: Orthopedics;  Laterality: Right;  . KNEE SURGERY    . MALONEY DILATION N/A 07/17/2014   Procedure: [name] DILATION;  Surgeon: Malissa Hippo, MD;  Location: [facility];  Service: Endoscopy;  Laterality: N/A;  . UMBILICAL HERNIA REPAIR  2003   Social History   Occupational History  . Occupation: Home maker  Tobacco Use  . Smoking status: Never Smoker  . Smokeless tobacco: Never Used  Substance and Sexual Activity  . Alcohol use: No    Alcohol/week: 0.0 standard drinks  . Drug use: No  . Sexual activity: Not on file

## 2019-08-09 IMAGING — NM NM MYOCAR MULTI W/SPECT W/WALL MOTION & EF
2 series · 12 of 12 positions shown · non-contrast
Comparison: none

[Series 1: rest · 6.51mm/px · 6 of 64 frames shown]
[frame 6/64]
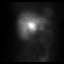
[frame 16/64]
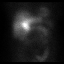
[frame 27/64]
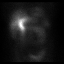
[frame 38/64]
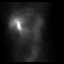
[frame 48/64]
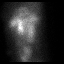
[frame 59/64]
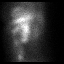

[Series 3: stress gated - perfusion · 6.51mm/px · 6 of 64 frames shown]
[frame 6/64]
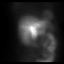
[frame 16/64]
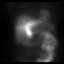
[frame 27/64]
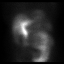
[frame 38/64]
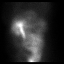
[frame 48/64]
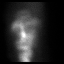
[frame 59/64]
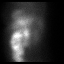

[12 of 12 positions shown; findings below may reference images not displayed]

Canned report from images found in remote index.

Refer to host system for actual result text.

## 2019-09-30 ENCOUNTER — Other Ambulatory Visit: Payer: Self-pay | Admitting: *Deleted

## 2019-09-30 DIAGNOSIS — Z20822 Contact with and (suspected) exposure to covid-19: Secondary | ICD-10-CM

## 2019-10-02 ENCOUNTER — Telehealth: Payer: Self-pay | Admitting: *Deleted

## 2019-10-02 LAB — NOVEL CORONAVIRUS, NAA: SARS-CoV-2, NAA: NOT DETECTED

## 2019-10-02 NOTE — Telephone Encounter (Signed)
Patient calling for COVID test results- notified negative for COVID

## 2020-02-19 ENCOUNTER — Other Ambulatory Visit: Payer: Self-pay | Admitting: *Deleted

## 2020-02-19 DIAGNOSIS — I83893 Varicose veins of bilateral lower extremities with other complications: Secondary | ICD-10-CM

## 2020-02-25 ENCOUNTER — Telehealth (HOSPITAL_COMMUNITY): Payer: Self-pay

## 2020-02-25 NOTE — Telephone Encounter (Signed)

## 2020-02-26 ENCOUNTER — Encounter: Payer: PRIVATE HEALTH INSURANCE | Admitting: Vascular Surgery

## 2020-02-26 ENCOUNTER — Encounter (HOSPITAL_COMMUNITY): Payer: PRIVATE HEALTH INSURANCE

## 2020-03-30 ENCOUNTER — Encounter (HOSPITAL_COMMUNITY): Payer: PRIVATE HEALTH INSURANCE

## 2020-03-30 ENCOUNTER — Encounter: Payer: PRIVATE HEALTH INSURANCE | Admitting: Vascular Surgery

## 2020-04-12 ENCOUNTER — Ambulatory Visit (HOSPITAL_COMMUNITY)
Admission: RE | Admit: 2020-04-12 | Discharge: 2020-04-12 | Disposition: A | Discharge status: Home / Self Care | Payer: PRIVATE HEALTH INSURANCE | Source: Ambulatory Visit | Attending: Vascular Surgery | Admitting: Vascular Surgery

## 2020-04-12 ENCOUNTER — Other Ambulatory Visit: Payer: Self-pay

## 2020-04-12 ENCOUNTER — Ambulatory Visit (INDEPENDENT_AMBULATORY_CARE_PROVIDER_SITE_OTHER): Payer: PRIVATE HEALTH INSURANCE | Admitting: Surgery

## 2020-04-12 ENCOUNTER — Encounter: Payer: Self-pay | Admitting: Surgery

## 2020-04-12 VITALS — BP 99/66 | HR 70 | Temp 97.3°F | Resp 20 | Ht 65.0 in | Wt 197.0 lb

## 2020-04-12 DIAGNOSIS — I83893 Varicose veins of bilateral lower extremities with other complications: Secondary | ICD-10-CM | POA: Diagnosis not present

## 2020-04-12 NOTE — Progress Notes (Signed)
[facility]  Patient name: Stacy Frost MRN: [id number] DOB: 08-29-1961 Sex: female   REQUESTING PROVIDER:    Dr. Sherryll Burger   REASON FOR CONSULT:    Varicose veins  HISTORY OF PRESENT ILLNESS:   Stacy Frost is a 59 y.o. female, who is referred for evaluation of leg swelling and varicose veins.  She states that she has been having problems for over a year now.  She is also concerned about discoloration and pain in her right leg.  She denies a history of DVT.  She is not wearing compression socks.  Patient suffers from type 2 diabetes.  She is a non-smoker.  She is medically managed for hypertension.  PAST MEDICAL HISTORY    Past Medical History:  Diagnosis Date   Anxiety    Aortic stenosis    Mild to moderate   Bicuspid aortic valve    Essential hypertension, benign    GERD (gastroesophageal reflux disease)    History of cardiac catheterization    Normal coronaries 2008   Hyperlipidemia    Migraine headache    MVP (mitral valve prolapse)    Obesity    Pain in joint, ankle and foot    Chronic   Palpitations    Documented PACs by previous monitoring   Restless leg syndrome    Skin tag    Type 2 diabetes mellitus (HCC)    Unilateral primary osteoarthritis, right knee 09/06/2016     FAMILY HISTORY   Family History  Problem Relation Age of Onset   Depression Mother    Cancer Mother        Lung mets (unsure as to what kind)   Diabetes Mother    Hypertension Father    Depression Father    Heart attack Father    Depression Daughter    Anxiety disorder Daughter    Stroke Daughter    Transient ischemic attack Brother    Emphysema Maternal Grandfather    Brain cancer Paternal Grandmother    Heart attack Paternal Grandfather     SOCIAL HISTORY:   Social History   Socioeconomic History   Marital status: Married    Spouse name: Harvie Heck   Number of children: 2   Years of  education: Not on file   Highest education level: Some college, no degree  Occupational History   Occupation: Arts development officer  Tobacco Use   Smoking status: Never Smoker   Smokeless tobacco: Never Used  Building services engineer Use: Never used  Substance and Sexual Activity   Alcohol use: No    Alcohol/week: 0.0 standard drinks   Drug use: No   Sexual activity: Not on file  Other Topics Concern   Not on file  Social History Narrative   Married, lives with husband in a one level home. They recently got a puppy. She avoids caffeine, does not exercise.    Social Determinants of Health   Financial Resource Strain:    Difficulty of Paying Living Expenses:   Food Insecurity:    Worried About Programme researcher, broadcasting/film/video in the Last Year:    Barista in the Last Year:   Transportation Needs:    Freight forwarder (Medical):    Lack of Transportation (Non-Medical):   Physical Activity:    Days of Exercise per Week:    Minutes of Exercise per Session:   Stress:    Feeling of Stress :   Social Connections:  Frequency of Communication with Friends and Family:    Frequency of Social Gatherings with Friends and Family:    Attends Religious Services:    Active Member of Clubs or Organizations:    Attends Archivist Meetings:    Marital Status:   Intimate Partner Violence:    Fear of Current or Ex-Partner:    Emotionally Abused:    Physically Abused:    Sexually Abused:     ALLERGIES:    Allergies  Allergen Reactions   Amlodipine Besylate Shortness Of Breath   Morphine And Related Shortness Of Breath and Other (See Comments)    Chest pain   Amitriptyline Other (See Comments)    Unknown reaction   Butorphanol Tartrate Other (See Comments)    REACTION: ED visit and got for migraine - not sure of response   Compazine [Prochlorperazine Edisylate] Other (See Comments)    Altered mental status   Rocephin [Ceftriaxone Sodium In Dextrose]  Other (See Comments)    Unknown   Sumatriptan Other (See Comments)    REACTION: HTN    CURRENT MEDICATIONS:    Current Outpatient Medications  Medication Sig Dispense Refill   acetaminophen-codeine (TYLENOL #3) 300-30 MG tablet Take 1 tablet by mouth 2 (two) times daily as needed.     aspirin EC 81 MG tablet Take 1 tablet (81 mg total) by mouth daily.     citalopram (CELEXA) 40 MG tablet Take 40 mg by mouth daily.     FARXIGA 5 MG TABS tablet Take 5 mg by mouth daily.     furosemide (LASIX) 20 MG tablet Take 40 mg by mouth daily.      lisinopril (PRINIVIL,ZESTRIL) 20 MG tablet Take 20 mg by mouth daily.      metFORMIN (GLUCOPHAGE-XR) 500 MG 24 hr tablet Take 500 mg by mouth daily.  2   metoprolol (TOPROL-XL) 200 MG 24 hr tablet Take 200 mg by mouth daily.       nitroGLYCERIN (NITROSTAT) 0.4 MG SL tablet Place 1 tablet (0.4 mg total) under the tongue every 5 (five) minutes as needed for chest pain. 25 tablet 3   potassium chloride SA (K-DUR,KLOR-CON) 20 MEQ tablet Take 20 mEq by mouth daily.      pramipexole (MIRAPEX) 0.5 MG tablet Take 1 mg by mouth at bedtime.     Semaglutide,0.25 or 0.5MG /DOS, (OZEMPIC, 0.25 OR 0.5 MG/DOSE,) 2 MG/1.5ML SOPN      simvastatin (ZOCOR) 40 MG tablet Take 40 mg by mouth daily.     traZODone (DESYREL) 100 MG tablet Take 100 mg by mouth at bedtime.     diphenhydrAMINE (BENADRYL) 25 mg capsule Take 75 mg by mouth at bedtime. (Patient not taking: Reported on 04/12/2020)     No current facility-administered medications for this visit.    REVIEW OF SYSTEMS:   [X]  denotes positive finding, [ ]  denotes negative finding Cardiac  Comments:  Chest pain or chest pressure:    Shortness of breath upon exertion:    Short of breath when lying flat:    Irregular heart rhythm:        Vascular    Pain in calf, thigh, or hip brought on by ambulation: x   Pain in feet at night that wakes you up from your sleep:     Blood clot in your veins:    Leg  swelling:  x       Pulmonary    Oxygen at home:    Productive cough:  Wheezing:         Neurologic    Sudden weakness in arms or legs:     Sudden numbness in arms or legs:     Sudden onset of difficulty speaking or slurred speech:    Temporary loss of vision in one eye:     Problems with dizziness:         Gastrointestinal    Blood in stool:      Vomited blood:         Genitourinary    Burning when urinating:     Blood in urine:        Psychiatric    Major depression:         Hematologic    Bleeding problems:    Problems with blood clotting too easily:        Skin    Rashes or ulcers:        Constitutional    Fever or chills:     PHYSICAL EXAM:   Vitals:   04/12/20 1410  BP: 99/66  Pulse: 70  Resp: 20  Temp: (!) 97.3 F (36.3 C)  SpO2: 97%  Weight: 197 lb (89.4 kg)  Height: 5\' 5"  (1.651 m)    GENERAL: The patient is a well-nourished female, in no acute distress. The vital signs are documented above. CARDIAC: There is a regular rate and rhythm.  VASCULAR: Prominent varicosities on the right medial calf.  Trace edema right leg with mild skin discoloration PULMONARY: Nonlabored respirations MUSCULOSKELETAL: There are no major deformities or cyanosis. NEUROLOGIC: No focal weakness or paresthesias are detected. SKIN: There are no ulcers or rashes noted. PSYCHIATRIC: The patient has a normal affect.  STUDIES:   I have reviewed the following duplex: Right:  - No evidence of deep vein thrombosis seen in the right lower extremity,  from the common femoral through the popliteal veins.  - No evidence of superficial venous thrombosis in the right lower  extremity.  - Venous reflux is noted in the right common femoral vein.  - Venous reflux is noted in the right sapheno-femoral junction.  - Venous reflux is noted in the right greater saphenous vein in the thigh.  - Venous reflux is noted in the right greater saphenous vein in the calf.  - Venous reflux is  noted in the right short saphenous vein.  - Cluster of varicosities originating off the GSV at the proximal calf  with gross reflux noted.    Left:  - No evidence of deep vein thrombosis seen in the left lower extremity,  from the common femoral through the popliteal veins.  - No evidence of superficial venous thrombosis in the left lower  extremity.  - No evidence of superficial venous reflux seen in the left short  saphenous vein.  - Venous reflux is noted in the left common femoral vein.  - Venous reflux is noted in the left sapheno-femoral junction.  - Venous reflux is noted in the left greater saphenous vein in the thigh.  - Venous reflux is noted in the left greater saphenous vein in the calf.   ASSESSMENT and PLAN   Varicose veins: The patient has bothersome varicose veins and leg swelling, the right leg bothers her more than the left.  Her duplex shows no significant deep vein refill box, however there was reflux in the saphenous vein.  Diameter measurements were greater than 0.5 cm.  I discussed with the patient that I would like to put her in 20-30  thigh-high compression stockings to see how this helps alleviate her symptoms.  She will be brought back in 3 months for repeat evaluation and consideration of laser ablation and stab phlebectomy of the right leg.   Charlena Cross, MD, FACS [facility] [phone] Pager [phone]

## 2020-07-15 ENCOUNTER — Ambulatory Visit: Payer: PRIVATE HEALTH INSURANCE | Admitting: Vascular Surgery

## 2020-08-26 ENCOUNTER — Ambulatory Visit: Payer: PRIVATE HEALTH INSURANCE | Admitting: Vascular Surgery

## 2022-03-30 LAB — HEMOGLOBIN A1C: Hemoglobin A1C: 8.6

## 2022-04-28 ENCOUNTER — Other Ambulatory Visit (HOSPITAL_COMMUNITY): Payer: Self-pay | Admitting: Family Medicine

## 2022-04-28 ENCOUNTER — Ambulatory Visit (HOSPITAL_COMMUNITY)
Admission: RE | Admit: 2022-04-28 | Discharge: 2022-04-28 | Disposition: A | Discharge status: Home / Self Care | Payer: No Typology Code available for payment source | Source: Ambulatory Visit | Attending: Family Medicine | Admitting: Family Medicine

## 2022-04-28 DIAGNOSIS — R1110 Vomiting, unspecified: Secondary | ICD-10-CM | POA: Insufficient documentation

## 2022-04-28 LAB — POCT I-STAT CREATININE: Creatinine, Ser: 0.7 mg/dL (ref 0.44–1.00)

## 2022-04-28 MED ORDER — IOHEXOL 300 MG/ML  SOLN
100.0000 mL | Freq: Once | INTRAMUSCULAR | Status: AC | PRN
  Administered 2022-04-28: 100 mL via INTRAVENOUS

## 2022-07-25 ENCOUNTER — Ambulatory Visit (INDEPENDENT_AMBULATORY_CARE_PROVIDER_SITE_OTHER): Payer: 59 | Admitting: Nurse Practitioner

## 2022-07-25 ENCOUNTER — Encounter: Payer: Self-pay | Admitting: Nurse Practitioner

## 2022-07-25 VITALS — BP 113/73 | HR 70 | Ht 65.0 in | Wt 197.0 lb

## 2022-07-25 DIAGNOSIS — E1165 Type 2 diabetes mellitus with hyperglycemia: Secondary | ICD-10-CM

## 2022-07-25 DIAGNOSIS — Z794 Long term (current) use of insulin: Secondary | ICD-10-CM

## 2022-07-25 LAB — POCT GLYCOSYLATED HEMOGLOBIN (HGB A1C): Hemoglobin A1C: 7.8 % — AB (ref 4.0–5.6)

## 2022-07-25 MED ORDER — TIRZEPATIDE 5 MG/0.5ML ~~LOC~~ SOAJ: 5.0000 mg | SUBCUTANEOUS | 3 refills | Status: DC

## 2022-07-25 NOTE — Progress Notes (Addendum)
Endocrinology Consult Note       07/25/2022, 1:02 PM   Subjective:    Patient ID: Stacy Frost, female    DOB: Feb 01, 1961.  Stacy Frost is being seen in consultation for management of currently uncontrolled symptomatic diabetes requested by  Monico Blitz, MD.   Past Medical History:  Diagnosis Date   Anxiety    Aortic stenosis    Mild to moderate   Bicuspid aortic valve    Essential hypertension, benign    GERD (gastroesophageal reflux disease)    History of cardiac catheterization    Normal coronaries 2008   Hyperlipidemia    Migraine headache    MVP (mitral valve prolapse)    Obesity    Pain in joint, ankle and foot    Chronic   Palpitations    Documented PACs by previous monitoring   Restless leg syndrome    Skin tag    Type 2 diabetes mellitus ([place])    Unilateral primary osteoarthritis, right knee 09/06/2016    Past Surgical History:  Procedure Laterality Date   ACHILLES TENDON REPAIR  2003   Left   CARDIAC CATHETERIZATION  2008   Normal coronary arteries; normal LV systolic function; mild dilation of aortic root; mild dilation of proximal ascending aorta; likely bicuspid aortic valve   CHOLECYSTECTOMY  2001   "Poor EF at 17%"   COLONOSCOPY N/A 07/17/2014   Procedure: COLONOSCOPY;  Surgeon: Rogene Houston, MD;  [facility];  Service: Endoscopy;  Laterality: N/A;  105   ESOPHAGEAL DILATION N/A 06/11/2015   Procedure: ESOPHAGEAL DILATION;  Surgeon: Rogene Houston, MD;  Location: [facility];  Service: Endoscopy;  Laterality: N/AVenia Minks 54/56/58   ESOPHAGOGASTRODUODENOSCOPY N/A 07/17/2014   Procedure: ESOPHAGOGASTRODUODENOSCOPY (EGD);  Surgeon: Rogene Houston, MD;  [facility]: [facility];  Service: Endoscopy;  Laterality: N/A;   ESOPHAGOGASTRODUODENOSCOPY (EGD) WITH PROPOFOL N/A 06/11/2015   Procedure: ESOPHAGOGASTRODUODENOSCOPY (EGD) WITH PROPOFOL;  Surgeon: Rogene Houston, MD;  Location:  [facility];  Service: Endoscopy;  Laterality: N/A;  730   HERNIA REPAIR     umbilical    KNEE ARTHROSCOPY WITH MEDIAL MENISECTOMY Right 07/02/2018   Procedure: RIGHT KNEE ARTHROSCOPY, DEBRIDEMENT, MEDIAL MENISECTOMY;  Surgeon: Garald Balding, MD;  Location: [place];  Service: Orthopedics;  Laterality: Right;   KNEE SURGERY     MALONEY DILATION N/A 07/17/2014   Procedure: Venia Minks DILATION;  Surgeon: Rogene Houston, MD;  [facility]: [facility];  Service: Endoscopy;  Laterality: N/A;   UMBILICAL HERNIA REPAIR  2003    Social History   Socioeconomic History   Marital status: Married    Spouse name: Louie Casa   Number of children: 2   Years of education: Not on file   Highest education level: Some college, no degree  Occupational History   Occupation: Materials engineer  Tobacco Use   Smoking status: Never   Smokeless tobacco: Never  Vaping Use   Vaping Use: Never used  Substance and Sexual Activity   Alcohol use: No    Alcohol/week: 0.0 standard drinks of alcohol   Drug use: No   Sexual activity: Not on file  Other Topics Concern  Not on file  Social History Narrative   Married, lives with husband in a one level home. They recently got a puppy. She avoids caffeine, does not exercise.    Social Determinants of Health   Financial Resource Strain: Not on file  Food Insecurity: Not on file  Transportation Needs: Not on file  Physical Activity: Not on file  Stress: Not on file  Social Connections: Not on file    Family History  Problem Relation Age of Onset   Depression Mother    Cancer Mother        Lung mets (unsure as to what kind)   Diabetes Mother    Hypertension Father    Depression Father    Heart attack Father    Depression Daughter    Anxiety disorder Daughter    Stroke Daughter    Transient ischemic attack Brother    Emphysema Maternal Grandfather    Brain cancer Paternal Grandmother    Heart attack Paternal Grandfather     Outpatient Encounter Medications as of  07/25/2022  Medication Sig   acetaminophen-codeine (TYLENOL #3) 300-30 MG tablet Take 1 tablet by mouth 2 (two) times daily as needed.   aspirin EC 81 MG tablet Take 1 tablet (81 mg total) by mouth daily.   citalopram (CELEXA) 40 MG tablet Take 40 mg by mouth daily.   Continuous Blood Gluc Receiver (DEXCOM G7 RECEIVER) DEVI USE 1 receiver continuously   Continuous Blood Gluc Sensor (DEXCOM G7 SENSOR) MISC APPLY 1 SENSOR EVERY 10 DAYS   furosemide (LASIX) 20 MG tablet Take 40 mg by mouth daily.    lisinopril (PRINIVIL,ZESTRIL) 20 MG tablet Take 20 mg by mouth daily.    metFORMIN (GLUCOPHAGE-XR) 500 MG 24 hr tablet Take 1,000 mg by mouth daily after breakfast.   metoprolol (TOPROL-XL) 200 MG 24 hr tablet Take 200 mg by mouth daily.     potassium chloride SA (K-DUR,KLOR-CON) 20 MEQ tablet Take 20 mEq by mouth daily.    pramipexole (MIRAPEX) 0.5 MG tablet Take 1 mg by mouth at bedtime.   SEMGLEE, YFGN, 100 UNIT/ML Pen Inject 20 Units into the skin at bedtime.   simvastatin (ZOCOR) 40 MG tablet Take 40 mg by mouth daily.   tirzepatide [facility]) 5 MG/0.5ML Pen Inject 5 mg into the skin once a week.   traZODone (DESYREL) 100 MG tablet Take 100 mg by mouth at bedtime.   [DISCONTINUED] FARXIGA 5 MG TABS tablet Take 5 mg by mouth daily.   [DISCONTINUED] MOUNJARO 2.5 MG/0.5ML Pen SMARTSIG:0.5 Milliliter(s) SUB-Q Once a Week   [DISCONTINUED] NOVOLOG FLEXPEN 100 UNIT/ML FlexPen Inject into the skin as directed.   diphenhydrAMINE (BENADRYL) 25 mg capsule Take 75 mg by mouth at bedtime. (Patient not taking: Reported on 04/12/2020)   nitroGLYCERIN (NITROSTAT) 0.4 MG SL tablet Place 1 tablet (0.4 mg total) under the tongue every 5 (five) minutes as needed for chest pain.   [DISCONTINUED] Semaglutide,0.25 or 0.5MG /DOS, (OZEMPIC, 0.25 OR 0.5 MG/DOSE,) 2 MG/1.5ML SOPN  (Patient not taking: Reported on 07/25/2022)   No facility-administered encounter medications on file as of 07/25/2022.    ALLERGIES: Allergies   Allergen Reactions   Amlodipine Besylate Shortness Of Breath   Morphine And Related Shortness Of Breath and Other (See Comments)    Chest pain   Amitriptyline Other (See Comments)    Unknown reaction   Butorphanol Tartrate Other (See Comments)    REACTION: ED visit and got for migraine - not sure of response   Compazine [  Prochlorperazine Edisylate] Other (See Comments)    Altered mental status   Rocephin [Ceftriaxone Sodium In Dextrose] Other (See Comments)    Unknown   Sumatriptan Other (See Comments)    REACTION: HTN    VACCINATION STATUS: Immunization History  Administered Date(s) Administered   H1N1 08/20/2008   Influenza Whole 08/20/2008   Pneumococcal Polysaccharide-23 08/20/2008   Td 05/30/1996    Diabetes She presents for her initial diabetic visit. She has type 2 diabetes mellitus. Onset time: Diagnosed at approx age of 61. Her disease course has been improving. Hypoglycemia symptoms include nervousness/anxiousness, sweats and tremors. There are no diabetic associated symptoms. Hypoglycemia complications include nocturnal hypoglycemia. Symptoms are stable. There are no diabetic complications. Risk factors for coronary artery disease include diabetes mellitus, dyslipidemia, family history, obesity, hypertension, sedentary lifestyle and post-menopausal. Current diabetic treatment includes oral agent (dual therapy) and intensive insulin program (and Mounjaro). She is compliant with treatment most of the time. Her weight is fluctuating minimally. She is following a generally unhealthy diet. When asked about meal planning, she reported none. She has not had a previous visit with a dietitian. She rarely participates in exercise. Her home blood glucose trend is decreasing steadily. Her overall blood glucose range is 110-130 mg/dl. (She presents today for her consultation with her CGM showing mostly at goal glycemic profile.  Her POCT A1c today is 7.8%, improving from last A1c on 8.6%.   She drinks flavored water and [place] mostly, eats generally only 2 meals per day (skipping lunch most days) with occasional snacks.  She does not engage in routine physical activity.  She is UTD on eye exam, has never seen podiatry in the past.  Analysis of her CGM shows TIR 93%, TAR 5%, TBR 2% with a GMI of 6.4%.) An ACE inhibitor/angiotensin II receptor blocker is being taken. She does not see a podiatrist.Eye exam is current.    Review of systems  Constitutional: + Minimally fluctuating body weight, current Body mass index is 32.78 kg/m., no fatigue, no subjective hyperthermia, no subjective hypothermia Eyes: no blurry vision, no xerophthalmia ENT: no sore throat, no nodules palpated in throat, no dysphagia/odynophagia, no hoarseness Cardiovascular: no chest pain, no shortness of breath, no palpitations, no leg swelling Respiratory: no cough, no shortness of breath Gastrointestinal: no nausea/vomiting/diarrhea Musculoskeletal: no muscle/joint aches Skin: no rashes, no hyperemia Neurological: no tremors, no numbness, no tingling, no dizziness Psychiatric: no depression, no anxiety  Objective:     BP 113/73 (BP Location: Left Arm, Patient Position: Sitting, Cuff Size: Large)   Pulse 70   Ht 5\' 5"  (1.651 m)   Wt 197 lb (89.4 kg)   BMI 32.78 kg/m   Wt Readings from Last 3 Encounters:  07/25/22 197 lb (89.4 kg)  04/12/20 197 lb (89.4 kg)  07/31/18 185 lb (83.9 kg)     BP Readings from Last 3 Encounters:  07/25/22 113/73  04/12/20 99/66  07/31/18 (!) 87/55     Physical Exam- Limited  Constitutional:  Body mass index is 32.78 kg/m. , not in acute distress, normal state of mind Eyes:  EOMI, no exophthalmos Neck: Supple Cardiovascular: RRR, + murmur, rubs, or gallops, no edema Respiratory: Adequate breathing efforts, no crackles, rales, rhonchi, or wheezing Musculoskeletal: no gross deformities, strength intact in all four extremities, no gross restriction of joint  movements Skin:  no rashes, no hyperemia Neurological: no tremor with outstretched hands   Diabetic Foot Exam - Simple   No data filed     CMP (  most recent) CMP     Component Value Date/Time   NA 139 06/27/2018 0929   K 3.8 06/27/2018 0929   CL 100 06/27/2018 0929   CO2 29 06/27/2018 0929   GLUCOSE 190 (H) 06/27/2018 0929   BUN 14 06/27/2018 0929   CREATININE 0.70 04/28/2022 1709   CALCIUM 9.6 06/27/2018 0929   PROT 8.2 (H) 06/16/2015 1535   ALBUMIN 4.8 06/16/2015 1535   AST 30 06/16/2015 1535   ALT 39 (H) 06/16/2015 1535   ALKPHOS 91 06/16/2015 1535   BILITOT 1.3 (H) 06/16/2015 1535   GFRNONAA >60 06/27/2018 0929   GFRAA >60 06/27/2018 0929     Diabetic Labs (most recent): Lab Results  Component Value Date   HGBA1C 7.8 (A) 07/25/2022   HGBA1C 8.6 03/30/2022   HGBA1C 6.7 (H) 06/27/2018     Lipid Panel ( most recent) Lipid Panel     Component Value Date/Time   CHOL 165 06/29/2008 0842   TRIG 206 06/29/2008 0842   HDL 42 06/29/2008 0842   LDLCALC 82 06/29/2008 0842      No results found for: "TSH", "FREET4"         Assessment & Plan:   1) Type 2 diabetes mellitus with hyperglycemia, with long-term current use of insulin (HCC)  She presents today for her consultation with her CGM showing mostly at goal glycemic profile.  Her POCT A1c today is 7.8%, improving from last A1c on 8.6%.  She drinks flavored water and [place] mostly, eats generally only 2 meals per day (skipping lunch most days) with occasional snacks.  She does not engage in routine physical activity.  She is UTD on eye exam, has never seen podiatry in the past.  Analysis of her CGM shows TIR 93%, TAR 5%, TBR 2% with a GMI of 6.4%.  - Stacy Frost has currently uncontrolled symptomatic type 2 DM since 61 years of age, with most recent A1c of 7.8 %.   -Recent labs reviewed.  - I had a long discussion with her about the progressive nature of diabetes and the pathology behind its  complications. -her diabetes is not currently complicated but she remains at a high risk for more acute and chronic complications which include CAD, CVA, CKD, retinopathy, and neuropathy. These are all discussed in detail with her.  The following Lifestyle Medicine recommendations according to American College of Lifestyle Medicine [facility]) were discussed and offered to patient and she agrees to start the journey:  A. Whole Foods, Plant-based plate comprising of fruits and vegetables, plant-based proteins, whole-grain carbohydrates was discussed in detail with the patient.   A list for source of those nutrients were also provided to the patient.  Patient will use only water or unsweetened tea for hydration. B.  The need to stay away from risky substances including alcohol, smoking; obtaining 7 to 9 hours of restorative sleep, at least 150 minutes of moderate intensity exercise weekly, the importance of healthy social connections,  and stress reduction techniques were discussed. C.  A full color page of  Calorie density of various food groups per pound showing examples of each food groups was provided to the patient.  - I have counseled her on diet and weight management by adopting a carbohydrate restricted/protein rich diet. Patient is encouraged to switch to unprocessed or minimally processed complex starch and increased protein intake (animal or plant source), fruits, and vegetables. -  she is advised to stick to a routine mealtimes to eat 3 meals a day  and avoid unnecessary snacks (to snack only to correct hypoglycemia).   - she acknowledges that there is a room for improvement in her food and drink choices. - Suggestion is made for her to avoid simple carbohydrates from her diet including Cakes, Sweet Desserts, Ice Cream, Soda (diet and regular), Sweet Tea, Candies, Chips, Cookies, Store Bought Juices, Alcohol in Excess of 1-2 drinks a day, Artificial Sweeteners, Coffee Creamer, and "Sugar-free"  Products. This will help patient to have more stable blood glucose profile and potentially avoid unintended weight gain.  - I have approached her with the following individualized plan to manage her diabetes and patient agrees:   -She can do well with her diabetes management with less medications.  I have de-escalated her regimen as follows.  She is advised to increase her Mounjaro to 5 mg SQ weekly (has been on the 2.5 since July without any negative SE), lower her Metformin to 1000 mg ER daily with breakfast, lower her Semglee to 20 units SQ nightly (instead of splitting doses), and stop [place].  -she is encouraged to start monitoring glucose 4 times daily, before meals and before bed, to log their readings on the clinic sheets provided, and bring them to review at follow up appointment in 2 weeks.  - she is warned not to take insulin without proper monitoring per orders. - Adjustment parameters are given to her for hypo and hyperglycemia in writing. - she is encouraged to call clinic for blood glucose levels less than 70 or above 300 mg /dl.  - her Marcelline Deist was be discontinued, due to polyuria and increased risk of UTI and yeast infections.  I also stopper her Novolog, to help decrease hypoglycemia.  - Specific targets for  A1c; LDL, HDL, and Triglycerides were discussed with the patient.  2) Blood Pressure /Hypertension:  her blood pressure is controlled to target.   she is advised to continue her current medications including Metoprolol 200 mg p.o. daily with breakfast, Lisinopril 20 mg po daily, Lasix 40 mg po daily.  3) Lipids/Hyperlipidemia:    There is no recent lipid panel to review.  she is advised to continue Simvastatin 40 mg daily at bedtime.  Side effects and precautions discussed with her.  4)  Weight/Diet:  her Body mass index is 32.78 kg/m.  -  clearly complicating her diabetes care.   she is a candidate for weight loss. I discussed with her the fact that loss of 5 - 10% of  her  current body weight will have the most impact on her diabetes management.  Exercise, and detailed carbohydrates information provided  -  detailed on discharge instructions.  5) Chronic Care/Health Maintenance: -she is on ACEI/ARB and Statin medications and is encouraged to initiate and continue to follow up with Ophthalmology, Dentist, Podiatrist at least yearly or according to recommendations, and advised to stay away from smoking. I have recommended yearly flu vaccine and pneumonia vaccine at least every 5 years; moderate intensity exercise for up to 150 minutes weekly; and sleep for at least 7 hours a day.  - she is advised to maintain close follow up with Kirstie Peri, MD for primary care needs, as well as her other providers for optimal and coordinated care.   - Time spent in this patient care: 60 min, of which > 50% was spent in counseling her about her diabetes and the rest reviewing her blood glucose logs, discussing her hypoglycemia and hyperglycemia episodes, reviewing her current and previous labs/studies (including abstraction from  other facilities) and medications doses and developing a long term treatment plan based on the latest standards of care/guidelines; and documenting her care.    Please refer to Patient Instructions for Blood Glucose Monitoring and Insulin/Medications Dosing Guide" in media tab for additional information. Please also refer to "Patient Self Inventory" in the Media tab for reviewed elements of pertinent patient history.  Stacy Frost participated in the discussions, expressed understanding, and voiced agreement with the above plans.  All questions were answered to her satisfaction. she is encouraged to contact clinic should she have any questions or concerns prior to her return visit.     Follow up plan: - Return in about 1 month (around 08/24/2022) for Diabetes F/U, Bring meter and logs.    Ronny Bacon, [facility] [address] [place], [place] [address] Phone: [phone] Fax: [phone]  07/25/2022, 1:02 PM

## 2022-07-25 NOTE — Patient Instructions (Signed)

## 2022-07-31 ENCOUNTER — Encounter: Payer: Self-pay | Admitting: Nurse Practitioner

## 2022-08-28 NOTE — Patient Instructions (Signed)

## 2022-08-29 ENCOUNTER — Encounter: Payer: Self-pay | Admitting: Nurse Practitioner

## 2022-08-29 ENCOUNTER — Ambulatory Visit (INDEPENDENT_AMBULATORY_CARE_PROVIDER_SITE_OTHER): Payer: 59 | Admitting: Nurse Practitioner

## 2022-08-29 VITALS — BP 114/70 | Ht 65.0 in | Wt 191.2 lb

## 2022-08-29 DIAGNOSIS — Z794 Long term (current) use of insulin: Secondary | ICD-10-CM | POA: Diagnosis not present

## 2022-08-29 DIAGNOSIS — E1165 Type 2 diabetes mellitus with hyperglycemia: Secondary | ICD-10-CM | POA: Diagnosis not present

## 2022-08-29 LAB — POCT UA - MICROALBUMIN
Albumin/Creatinine Ratio, Urine, POC: <30
Creatinine, POC: 200 mg/dL
Microalbumin Ur, POC: 30 mg/L

## 2022-08-29 MED ORDER — TIRZEPATIDE 7.5 MG/0.5ML ~~LOC~~ SOAJ: 7.5000 mg | SUBCUTANEOUS | 0 refills | Status: DC

## 2022-08-29 NOTE — Progress Notes (Signed)
Endocrinology Follow Up Note       08/29/2022, 9:36 AM   Subjective:    Patient ID: Stacy Frost, female    DOB: [birth year].  Stacy Frost is being seen in follow up after being seen in consultation for management of currently uncontrolled symptomatic diabetes requested by  Monico Blitz, MD.   Past Medical History:  Diagnosis Date   Anxiety    Aortic stenosis    Mild to moderate   Bicuspid aortic valve    Essential hypertension, benign    GERD (gastroesophageal reflux disease)    History of cardiac catheterization    Normal coronaries 2008   Hyperlipidemia    Migraine headache    MVP (mitral valve prolapse)    Obesity    Pain in joint, ankle and foot    Chronic   Palpitations    Documented PACs by previous monitoring   Restless leg syndrome    Skin tag    Type 2 diabetes mellitus ([place])    Unilateral primary osteoarthritis, right knee 09/06/2016    Past Surgical History:  Procedure Laterality Date   ACHILLES TENDON REPAIR  2003   Left   CARDIAC CATHETERIZATION  2008   Normal coronary arteries; normal LV systolic function; mild dilation of aortic root; mild dilation of proximal ascending aorta; likely bicuspid aortic valve   CHOLECYSTECTOMY  2001   "Poor EF at 17%"   COLONOSCOPY N/A 07/17/2014   Procedure: COLONOSCOPY;  Surgeon: Rogene Houston, MD;  [facility];  Service: Endoscopy;  Laterality: N/A;  105   ESOPHAGEAL DILATION N/A 06/11/2015   Procedure: ESOPHAGEAL DILATION;  Surgeon: Rogene Houston, MD;  Location: [facility];  Service: Endoscopy;  Laterality: N/AVenia Frost 54/56/58   ESOPHAGOGASTRODUODENOSCOPY N/A 07/17/2014   Procedure: ESOPHAGOGASTRODUODENOSCOPY (EGD);  Surgeon: Rogene Houston, MD;  [facility];  Service: Endoscopy;  Laterality: N/A;   ESOPHAGOGASTRODUODENOSCOPY (EGD) WITH PROPOFOL N/A 06/11/2015   Procedure: ESOPHAGOGASTRODUODENOSCOPY (EGD) WITH PROPOFOL;   Surgeon: Rogene Houston, MD;  Location: [facility];  Service: Endoscopy;  Laterality: N/A;  730   HERNIA REPAIR     umbilical    KNEE ARTHROSCOPY WITH MEDIAL MENISECTOMY Right 07/02/2018   Procedure: RIGHT KNEE ARTHROSCOPY, DEBRIDEMENT, MEDIAL MENISECTOMY;  Surgeon: Garald Balding, MD;  Location: [place];  Service: Orthopedics;  Laterality: Right;   KNEE SURGERY     MALONEY DILATION N/A 07/17/2014   Procedure: Stacy Frost DILATION;  Surgeon: Rogene Houston, MD;  [facility];  Service: Endoscopy;  Laterality: N/A;   UMBILICAL HERNIA REPAIR  2003    Social History   Socioeconomic History   Marital status: Married    Spouse name: Louie Casa   Number of children: 2   Years of education: Not on file   Highest education level: Some college, no degree  Occupational History   Occupation: Materials engineer  Tobacco Use   Smoking status: Never   Smokeless tobacco: Never  Vaping Use   Vaping Use: Never used  Substance and Sexual Activity   Alcohol use: No    Alcohol/week: 0.0 standard drinks of alcohol   Drug use: No   Sexual activity: Not on  file  Other Topics Concern   Not on file  Social History Narrative   Married, lives with husband in a one level home. They recently got a puppy. She avoids caffeine, does not exercise.    Social Determinants of Health   Financial Resource Strain: Not on file  Food Insecurity: Not on file  Transportation Needs: Not on file  Physical Activity: Not on file  Stress: Not on file  Social Connections: Not on file    Family History  Problem Relation Age of Onset   Depression Mother    Cancer Mother        Lung mets (unsure as to what kind)   Diabetes Mother    Hypertension Father    Depression Father    Heart attack Father    Depression Daughter    Anxiety disorder Daughter    Stroke Daughter    Transient ischemic attack Brother    Emphysema Maternal Grandfather    Brain cancer Paternal Grandmother    Heart attack Paternal Grandfather      Outpatient Encounter Medications as of 08/29/2022  Medication Sig   acetaminophen-codeine (TYLENOL #3) 300-30 MG tablet Take 1 tablet by mouth 2 (two) times daily as needed.   aspirin EC 81 MG tablet Take 1 tablet (81 mg total) by mouth daily.   citalopram (CELEXA) 40 MG tablet Take 40 mg by mouth daily.   Continuous Blood Gluc Receiver (DEXCOM G7 RECEIVER) DEVI USE 1 receiver continuously   Continuous Blood Gluc Sensor (DEXCOM G7 SENSOR) MISC APPLY 1 SENSOR EVERY 10 DAYS   furosemide (LASIX) 20 MG tablet Take 40 mg by mouth daily.    lisinopril (PRINIVIL,ZESTRIL) 20 MG tablet Take 20 mg by mouth daily.    metFORMIN (GLUCOPHAGE-XR) 500 MG 24 hr tablet Take 1,000 mg by mouth daily after breakfast.   metoprolol (TOPROL-XL) 200 MG 24 hr tablet Take 200 mg by mouth daily.     potassium chloride SA (K-DUR,KLOR-CON) 20 MEQ tablet Take 20 mEq by mouth daily.    pramipexole (MIRAPEX) 0.5 MG tablet Take 1 mg by mouth at bedtime.   SEMGLEE, YFGN, 100 UNIT/ML Pen Inject 10 Units into the skin at bedtime.   simvastatin (ZOCOR) 40 MG tablet Take 40 mg by mouth daily.   tirzepatide (MOUNJARO) 7.5 MG/0.5ML Pen Inject 7.5 mg into the skin once a week.   traZODone (DESYREL) 100 MG tablet Take 100 mg by mouth at bedtime.   [DISCONTINUED] tirzepatide [facility]) 5 MG/0.5ML Pen Inject 5 mg into the skin once a week.   diphenhydrAMINE (BENADRYL) 25 mg capsule Take 75 mg by mouth at bedtime. (Patient not taking: Reported on 04/12/2020)   nitroGLYCERIN (NITROSTAT) 0.4 MG SL tablet Place 1 tablet (0.4 mg total) under the tongue every 5 (five) minutes as needed for chest pain.   No facility-administered encounter medications on file as of 08/29/2022.    ALLERGIES: Allergies  Allergen Reactions   Amlodipine Besylate Shortness Of Breath   Morphine And Related Shortness Of Breath and Other (See Comments)    Chest pain   Amitriptyline Other (See Comments)    Unknown reaction   Butorphanol Tartrate Other (See  Comments)    REACTION: ED visit and got for migraine - not sure of response   Compazine [Prochlorperazine Edisylate] Other (See Comments)    Altered mental status   Rocephin [Ceftriaxone Sodium In Dextrose] Other (See Comments)    Unknown   Sumatriptan Other (See Comments)    REACTION: HTN  VACCINATION STATUS: Immunization History  Administered Date(s) Administered   H1N1 08/20/2008   Influenza Whole 08/20/2008   Pneumococcal Polysaccharide-23 08/20/2008   Td 05/30/1996    Diabetes She presents for her follow-up diabetic visit. She has type 2 diabetes mellitus. Onset time: Diagnosed at approx age of 61. Her disease course has been improving. There are no hypoglycemic associated symptoms. There are no diabetic associated symptoms. There are no hypoglycemic complications. Symptoms are stable. There are no diabetic complications. Risk factors for coronary artery disease include diabetes mellitus, dyslipidemia, family history, obesity, hypertension, sedentary lifestyle and post-menopausal. Current diabetic treatment includes insulin injections and oral agent (monotherapy) (and Mounjaro). She is compliant with treatment most of the time. Her weight is decreasing steadily. She is following a generally healthy diet. When asked about meal planning, she reported none. She has not had a previous visit with a dietitian. She rarely participates in exercise. Her home blood glucose trend is decreasing steadily. Her overall blood glucose range is 110-130 mg/dl. (She presents today with her CGM showing at goal glycemic profile.  She was not due for another A1c today.  Analysis of her CGM shows TIR 97%, TAR 2%, TBR <1% with a GMI of 6.4%.  She has even lost 6 lbs since last visit.) An ACE inhibitor/angiotensin II receptor blocker is being taken. She does not see a podiatrist.Eye exam is current.    Review of systems  Constitutional: + steadily decreasing body weight,  current Body mass index is 31.82  kg/m. , no fatigue, no subjective hyperthermia, no subjective hypothermia Eyes: no blurry vision, no xerophthalmia ENT: no sore throat, no nodules palpated in throat, no dysphagia/odynophagia, no hoarseness Cardiovascular: no chest pain, no shortness of breath, no palpitations, no leg swelling Respiratory: no cough, no shortness of breath Gastrointestinal: no nausea/vomiting/diarrhea Musculoskeletal: no muscle/joint aches Skin: no rashes, no hyperemia Neurological: no tremors, no numbness, no tingling, no dizziness Psychiatric: no depression, no anxiety  Objective:     BP 114/70 (BP Location: Left Arm, Patient Position: Sitting, Cuff Size: Normal)   Ht 5\' 5"  (1.651 m)   Wt 191 lb 3.2 oz (86.7 kg)   BMI 31.82 kg/m   Wt Readings from Last 3 Encounters:  08/29/22 191 lb 3.2 oz (86.7 kg)  07/25/22 197 lb (89.4 kg)  04/12/20 197 lb (89.4 kg)     BP Readings from Last 3 Encounters:  08/29/22 114/70  07/25/22 113/73  04/12/20 99/66     Physical Exam- Limited  Constitutional:  Body mass index is 31.82 kg/m. , not in acute distress, normal state of mind Eyes:  EOMI, no exophthalmos Neck: Supple Cardiovascular: RRR, + murmur, rubs, or gallops, no edema Respiratory: Adequate breathing efforts, no crackles, rales, rhonchi, or wheezing Musculoskeletal: no gross deformities, strength intact in all four extremities, no gross restriction of joint movements Skin:  no rashes, no hyperemia Neurological: no tremor with outstretched hands   Diabetic Foot Exam - Simple   No data filed     CMP ( most recent) CMP     Component Value Date/Time   NA 139 06/27/2018 0929   K 3.8 06/27/2018 0929   CL 100 06/27/2018 0929   CO2 29 06/27/2018 0929   GLUCOSE 190 (H) 06/27/2018 0929   BUN 14 06/27/2018 0929   CREATININE 0.70 04/28/2022 1709   CALCIUM 9.6 06/27/2018 0929   PROT 8.2 (H) 06/16/2015 1535   ALBUMIN 4.8 06/16/2015 1535   AST 30 06/16/2015 1535   ALT 39 (H) 06/16/2015  1535    ALKPHOS 91 06/16/2015 1535   BILITOT 1.3 (H) 06/16/2015 1535   GFRNONAA >60 06/27/2018 0929   GFRAA >60 06/27/2018 0929     Diabetic Labs (most recent): Lab Results  Component Value Date   HGBA1C 7.8 (A) 07/25/2022   HGBA1C 8.6 03/30/2022   HGBA1C 6.7 (H) 06/27/2018   MICROALBUR 30 mg/L 08/29/2022     Lipid Panel ( most recent) Lipid Panel     Component Value Date/Time   CHOL 165 06/29/2008 0842   TRIG 206 06/29/2008 0842   HDL 42 06/29/2008 0842   LDLCALC 82 06/29/2008 0842      No results found for: "TSH", "FREET4"         Assessment & Plan:   1) Type 2 diabetes mellitus with hyperglycemia, with long-term current use of insulin (HCC)  She presents today with her CGM showing at goal glycemic profile.  She was not due for another A1c today.  Analysis of her CGM shows TIR 97%, TAR 2%, TBR <1% with a GMI of 6.4%.  She has even lost 6 lbs since last visit.  - Stacy Frost has currently uncontrolled symptomatic type 2 DM since 61 years of age, with most recent A1c of 7.8 %.   -Recent labs reviewed.  - I had a long discussion with her about the progressive nature of diabetes and the pathology behind its complications. -her diabetes is not currently complicated but she remains at a high risk for more acute and chronic complications which include CAD, CVA, CKD, retinopathy, and neuropathy. These are all discussed in detail with her.  The following Lifestyle Medicine recommendations according to American College of Lifestyle Medicine [facility]) were discussed and offered to patient and she agrees to start the journey:  A. Whole Foods, Plant-based plate comprising of fruits and vegetables, plant-based proteins, whole-grain carbohydrates was discussed in detail with the patient.   A list for source of those nutrients were also provided to the patient.  Patient will use only water or unsweetened tea for hydration. B.  The need to stay away from risky substances including alcohol,  smoking; obtaining 7 to 9 hours of restorative sleep, at least 150 minutes of moderate intensity exercise weekly, the importance of healthy social connections,  and stress reduction techniques were discussed. C.  A full color page of  Calorie density of various food groups per pound showing examples of each food groups was provided to the patient.  - Nutritional counseling repeated at each appointment due to patients tendency to fall back in to old habits.  - The patient admits there is a room for improvement in their diet and drink choices. -  Suggestion is made for the patient to avoid simple carbohydrates from their diet including Cakes, Sweet Desserts / Pastries, Ice Cream, Soda (diet and regular), Sweet Tea, Candies, Chips, Cookies, Sweet Pastries, Store Bought Juices, Alcohol in Excess of 1-2 drinks a day, Artificial Sweeteners, Coffee Creamer, and "Sugar-free" Products. This will help patient to have stable blood glucose profile and potentially avoid unintended weight gain.   - I encouraged the patient to switch to unprocessed or minimally processed complex starch and increased protein intake (animal or plant source), fruits, and vegetables.   - Patient is advised to stick to a routine mealtimes to eat 3 meals a day and avoid unnecessary snacks (to snack only to correct hypoglycemia).  - I have approached her with the following individualized plan to manage her diabetes and patient agrees:   -  She has done well with less medications.  She is advised to use up her current supply of Mounjaro 5 mg weekly, then will increase to 7.5 mg SQ weekly to start working her off the [employer].  She will decrease her Semglee to 10 units SQ nightly once she starts the 7.5 mg of the Mounjaro weekly.  She can continue her Metformin 1000 mg ER PO daily with breakfast.    -she is encouraged to continue monitoring glucose 4 times daily (using her CGM), before meals and before bed, and to call the clinic if she has  readings less than 70 or above 200 for 3 tests in a row.   - she is warned not to take insulin without proper monitoring per orders. - Adjustment parameters are given to her for hypo and hyperglycemia in writing.  - her Marcelline Deist was discontinued, due to polyuria and increased risk of UTI and yeast infections.  I also stopped her Novolog to prevent hypoglycemia.  - Specific targets for  A1c; LDL, HDL, and Triglycerides were discussed with the patient.  2) Blood Pressure /Hypertension:  her blood pressure is controlled to target.   she is advised to continue her current medications including Metoprolol 200 mg p.o. daily with breakfast, Lisinopril 20 mg po daily, Lasix 40 mg po daily.  3) Lipids/Hyperlipidemia:    There is no recent lipid panel to review.  she is advised to continue Simvastatin 40 mg daily at bedtime.  Side effects and precautions discussed with her.  4)  Weight/Diet:  her Body mass index is 31.82 kg/m.  -  clearly complicating her diabetes care.   she is a candidate for weight loss. I discussed with her the fact that loss of 5 - 10% of her  current body weight will have the most impact on her diabetes management.  Exercise, and detailed carbohydrates information provided  -  detailed on discharge instructions.  5) Chronic Care/Health Maintenance: -she is on ACEI/ARB and Statin medications and is encouraged to initiate and continue to follow up with Ophthalmology, Dentist, Podiatrist at least yearly or according to recommendations, and advised to stay away from smoking. I have recommended yearly flu vaccine and pneumonia vaccine at least every 5 years; moderate intensity exercise for up to 150 minutes weekly; and sleep for at least 7 hours a day.  - she is advised to maintain close follow up with Kirstie Peri, MD for primary care needs, as well as her other providers for optimal and coordinated care.    I spent 31 minutes in the care of the patient today including review of  labs from CMP, Lipids, Thyroid Function, Hematology (current and previous including abstractions from other facilities); face-to-face time discussing  her blood glucose readings/logs, discussing hypoglycemia and hyperglycemia episodes and symptoms, medications doses, her options of short and long term treatment based on the latest standards of care / guidelines;  discussion about incorporating lifestyle medicine;  and documenting the encounter. Risk reduction counseling performed per USPSTF guidelines to reduce obesity and cardiovascular risk factors.     Please refer to Patient Instructions for Blood Glucose Monitoring and Insulin/Medications Dosing Guide"  in media tab for additional information. Please  also refer to " Patient Self Inventory" in the Media  tab for reviewed elements of pertinent patient history.  [name] participated in the discussions, expressed understanding, and voiced agreement with the above plans.  All questions were answered to her satisfaction. she is encouraged to contact clinic should she have  any questions or concerns prior to her return visit.     Follow up plan: - Return in about 3 months (around 11/29/2022) for Diabetes F/U with A1c in office, No previsit labs, Bring meter and logs.   Ronny Bacon, [facility] [address] [place], [place] [address] Phone: [phone] Fax: [phone]  08/29/2022, 9:36 AM

## 2022-09-10 ENCOUNTER — Encounter (INDEPENDENT_AMBULATORY_CARE_PROVIDER_SITE_OTHER): Payer: Self-pay | Admitting: Gastroenterology

## 2022-11-04 ENCOUNTER — Other Ambulatory Visit: Payer: Self-pay | Admitting: Nurse Practitioner

## 2022-11-29 ENCOUNTER — Encounter: Payer: Self-pay | Admitting: Nurse Practitioner

## 2022-11-29 ENCOUNTER — Ambulatory Visit (INDEPENDENT_AMBULATORY_CARE_PROVIDER_SITE_OTHER): Payer: 59 | Admitting: Nurse Practitioner

## 2022-11-29 VITALS — BP 102/67 | HR 83 | Ht 65.0 in | Wt 183.4 lb

## 2022-11-29 DIAGNOSIS — E1165 Type 2 diabetes mellitus with hyperglycemia: Secondary | ICD-10-CM | POA: Diagnosis not present

## 2022-11-29 DIAGNOSIS — Z794 Long term (current) use of insulin: Secondary | ICD-10-CM | POA: Diagnosis not present

## 2022-11-29 LAB — POCT GLYCOSYLATED HEMOGLOBIN (HGB A1C): Hemoglobin A1C: 6.2 % — AB (ref 4.0–5.6)

## 2022-11-29 NOTE — Progress Notes (Signed)
Endocrinology Follow Up Note       11/29/2022, 9:16 AM   Subjective:    Patient ID: Stacy Frost, female    DOB: [birth year].  Stacy Frost is being seen in follow up after being seen in consultation for management of currently uncontrolled symptomatic diabetes requested by  Monico Blitz, MD.   Past Medical History:  Diagnosis Date   Anxiety    Aortic stenosis    Mild to moderate   Bicuspid aortic valve    Essential hypertension, benign    GERD (gastroesophageal reflux disease)    History of cardiac catheterization    Normal coronaries 2008   Hyperlipidemia    Migraine headache    MVP (mitral valve prolapse)    Obesity    Pain in joint, ankle and foot    Chronic   Palpitations    Documented PACs by previous monitoring   Restless leg syndrome    Skin tag    Type 2 diabetes mellitus ([place])    Unilateral primary osteoarthritis, right knee 09/06/2016    Past Surgical History:  Procedure Laterality Date   ACHILLES TENDON REPAIR  2003   Left   CARDIAC CATHETERIZATION  2008   Normal coronary arteries; normal LV systolic function; mild dilation of aortic root; mild dilation of proximal ascending aorta; likely bicuspid aortic valve   CHOLECYSTECTOMY  2001   "Poor EF at 17%"   COLONOSCOPY N/A 07/17/2014   Procedure: COLONOSCOPY;  Surgeon: Rogene Houston, MD;  [facility];  Service: Endoscopy;  Laterality: N/A;  105   ESOPHAGEAL DILATION N/A 06/11/2015   Procedure: ESOPHAGEAL DILATION;  Surgeon: Rogene Houston, MD;  Location: [facility];  Service: Endoscopy;  Laterality: N/AVenia Minks 54/56/58   ESOPHAGOGASTRODUODENOSCOPY N/A 07/17/2014   Procedure: ESOPHAGOGASTRODUODENOSCOPY (EGD);  Surgeon: Rogene Houston, MD;  [facility];  Service: Endoscopy;  Laterality: N/A;   ESOPHAGOGASTRODUODENOSCOPY (EGD) WITH PROPOFOL N/A 06/11/2015   Procedure: ESOPHAGOGASTRODUODENOSCOPY (EGD) WITH PROPOFOL;  Surgeon:  Rogene Houston, MD;  Location: [facility];  Service: Endoscopy;  Laterality: N/A;  730   HERNIA REPAIR     umbilical    KNEE ARTHROSCOPY WITH MEDIAL MENISECTOMY Right 07/02/2018   Procedure: RIGHT KNEE ARTHROSCOPY, DEBRIDEMENT, MEDIAL MENISECTOMY;  Surgeon: Garald Balding, MD;  Location: [facility];  Service: Orthopedics;  Laterality: Right;   KNEE SURGERY     MALONEY DILATION N/A 07/17/2014   Procedure: Venia Minks DILATION;  Surgeon: Rogene Houston, MD;  [facility];  Service: Endoscopy;  Laterality: N/A;   UMBILICAL HERNIA REPAIR  2003    Social History   Socioeconomic History   Marital status: Married    Spouse name: Stacy Frost   Number of children: 2   Years of education: Not on file   Highest education level: Some college, no degree  Occupational History   Occupation: Materials engineer  Tobacco Use   Smoking status: Never   Smokeless tobacco: Never  Vaping Use   Vaping Use: Never used  Substance and Sexual Activity   Alcohol use: No    Alcohol/week: 0.0 standard drinks of alcohol   Drug use: No   Sexual activity: Not on  file  Other Topics Concern   Not on file  Social History Narrative   Married, lives with husband in a one level home. They recently got a puppy. She avoids caffeine, does not exercise.    Social Determinants of Health   Financial Resource Strain: Not on file  Food Insecurity: Not on file  Transportation Needs: Not on file  Physical Activity: Not on file  Stress: Not on file  Social Connections: Not on file    Family History  Problem Relation Age of Onset   Depression Mother    Cancer Mother        Lung mets (unsure as to what kind)   Diabetes Mother    Hypertension Father    Depression Father    Heart attack Father    Depression Daughter    Anxiety disorder Daughter    Stroke Daughter    Transient ischemic attack Brother    Emphysema Maternal Grandfather    Brain cancer Paternal Grandmother    Heart attack Paternal Grandfather     Outpatient  Encounter Medications as of 11/29/2022  Medication Sig   acetaminophen-codeine (TYLENOL #3) 300-30 MG tablet Take 1 tablet by mouth 2 (two) times daily as needed.   aspirin EC 81 MG tablet Take 1 tablet (81 mg total) by mouth daily.   citalopram (CELEXA) 40 MG tablet Take 40 mg by mouth daily.   Continuous Blood Gluc Receiver (DEXCOM G7 RECEIVER) DEVI USE 1 receiver continuously   Continuous Blood Gluc Sensor (DEXCOM G7 SENSOR) MISC APPLY 1 SENSOR EVERY 10 DAYS   furosemide (LASIX) 20 MG tablet Take 40 mg by mouth daily.    lisinopril (PRINIVIL,ZESTRIL) 20 MG tablet Take 20 mg by mouth daily.    metFORMIN (GLUCOPHAGE-XR) 500 MG 24 hr tablet Take 1,000 mg by mouth daily after breakfast.   metoprolol (TOPROL-XL) 200 MG 24 hr tablet Take 200 mg by mouth daily.     potassium chloride SA (K-DUR,KLOR-CON) 20 MEQ tablet Take 20 mEq by mouth daily.    pramipexole (MIRAPEX) 0.5 MG tablet Take 1 mg by mouth at bedtime.   simvastatin (ZOCOR) 40 MG tablet Take 40 mg by mouth daily.   tirzepatide (MOUNJARO) 7.5 MG/0.5ML Pen INJECT 7.5MG  INTO THE SKIN ONCE A WEEK   traZODone (DESYREL) 100 MG tablet Take 100 mg by mouth at bedtime.   [DISCONTINUED] SEMGLEE, YFGN, 100 UNIT/ML Pen Inject 10 Units into the skin at bedtime.   nitroGLYCERIN (NITROSTAT) 0.4 MG SL tablet Place 1 tablet (0.4 mg total) under the tongue every 5 (five) minutes as needed for chest pain.   [DISCONTINUED] diphenhydrAMINE (BENADRYL) 25 mg capsule Take 75 mg by mouth at bedtime. (Patient not taking: Reported on 04/12/2020)   No facility-administered encounter medications on file as of 11/29/2022.    ALLERGIES: Allergies  Allergen Reactions   Amlodipine Besylate Shortness Of Breath   Morphine And Related Shortness Of Breath and Other (See Comments)    Chest pain   Amitriptyline Other (See Comments)    Unknown reaction   Butorphanol Tartrate Other (See Comments)    REACTION: ED visit and got for migraine - not sure of response    Compazine [Prochlorperazine Edisylate] Other (See Comments)    Altered mental status   Rocephin [Ceftriaxone Sodium In Dextrose] Other (See Comments)    Unknown   Sumatriptan Other (See Comments)    REACTION: HTN    VACCINATION STATUS: Immunization History  Administered Date(s) Administered   H1N1 08/20/2008   Influenza  Whole 08/20/2008   Pneumococcal Polysaccharide-23 08/20/2008   Td 05/30/1996    Diabetes She presents for her follow-up diabetic visit. She has type 2 diabetes mellitus. Onset time: Diagnosed at approx age of 48. Her disease course has been improving. There are no hypoglycemic associated symptoms. Associated symptoms include weight loss. There are no hypoglycemic complications. Symptoms are stable. There are no diabetic complications. Risk factors for coronary artery disease include diabetes mellitus, dyslipidemia, family history, obesity, hypertension, sedentary lifestyle and post-menopausal. Current diabetic treatment includes oral agent (monotherapy) (and Mounjaro). She is compliant with treatment most of the time. Her weight is decreasing steadily. She is following a generally healthy diet. When asked about meal planning, she reported none. She has not had a previous visit with a dietitian. She rarely participates in exercise. Her home blood glucose trend is fluctuating minimally. Her overall blood glucose range is 140-180 mg/dl. (She presents today with her CGM showing at goal glycemic profile overall.  Her POCT A1c today is 6.2%, improving from last visit of 7.8%.  She continues to do well with diet and has lost some weight making her feel better.  She has even stopped her Semglee between visits!  Analysis of her CGM shows TIR 73%, TAR 27%, TBR <1% with a GMI of 7.2%.) An ACE inhibitor/angiotensin II receptor blocker is being taken. She does not see a podiatrist.Eye exam is current.    Review of systems  Constitutional: + steadily decreasing body weight,  current Body  mass index is 30.52 kg/m. , no fatigue, no subjective hyperthermia, no subjective hypothermia Eyes: no blurry vision, no xerophthalmia ENT: no sore throat, no nodules palpated in throat, no dysphagia/odynophagia, no hoarseness Cardiovascular: no chest pain, no shortness of breath, no palpitations, no leg swelling Respiratory: no cough, no shortness of breath Gastrointestinal: no nausea/vomiting/diarrhea Musculoskeletal: no muscle/joint aches Skin: no rashes, no hyperemia Neurological: no tremors, no numbness, no tingling, no dizziness Psychiatric: no depression, no anxiety  Objective:     BP 102/67 (BP Location: Right Arm, Patient Position: Sitting, Cuff Size: Normal)   Pulse 83   Ht 5\' 5"  (1.651 m)   Wt 183 lb 6.4 oz (83.2 kg)   BMI 30.52 kg/m   Wt Readings from Last 3 Encounters:  11/29/22 183 lb 6.4 oz (83.2 kg)  08/29/22 191 lb 3.2 oz (86.7 kg)  07/25/22 197 lb (89.4 kg)     BP Readings from Last 3 Encounters:  11/29/22 102/67  08/29/22 114/70  07/25/22 113/73     Physical Exam- Limited  Constitutional:  Body mass index is 30.52 kg/m. , not in acute distress, normal state of mind Eyes:  EOMI, no exophthalmos Musculoskeletal: no gross deformities, strength intact in all four extremities, no gross restriction of joint movements Skin:  no rashes, no hyperemia Neurological: no tremor with outstretched hands   Diabetic Foot Exam - Simple   No data filed     CMP ( most recent) CMP     Component Value Date/Time   NA 139 06/27/2018 0929   K 3.8 06/27/2018 0929   CL 100 06/27/2018 0929   CO2 29 06/27/2018 0929   GLUCOSE 190 (H) 06/27/2018 0929   BUN 14 06/27/2018 0929   CREATININE 0.70 04/28/2022 1709   CALCIUM 9.6 06/27/2018 0929   PROT 8.2 (H) 06/16/2015 1535   ALBUMIN 4.8 06/16/2015 1535   AST 30 06/16/2015 1535   ALT 39 (H) 06/16/2015 1535   ALKPHOS 91 06/16/2015 1535   BILITOT 1.3 (H) 06/16/2015  1535   GFRNONAA >60 06/27/2018 0929   GFRAA >60  06/27/2018 0929     Diabetic Labs (most recent): Lab Results  Component Value Date   HGBA1C 6.2 (A) 11/29/2022   HGBA1C 7.8 (A) 07/25/2022   HGBA1C 8.6 03/30/2022   MICROALBUR 30 mg/L 08/29/2022     Lipid Panel ( most recent) Lipid Panel     Component Value Date/Time   CHOL 165 06/29/2008 0842   TRIG 206 06/29/2008 0842   HDL 42 06/29/2008 0842   LDLCALC 82 06/29/2008 0842      No results found for: "TSH", "FREET4"         Assessment & Plan:   1) Type 2 diabetes mellitus with hyperglycemia, with long-term current use of insulin (HCC)  She presents today with her CGM showing at goal glycemic profile overall.  Her POCT A1c today is 6.2%, improving from last visit of 7.8%.  She continues to do well with diet and has lost some weight making her feel better.  She has even stopped her Semglee between visits!  Analysis of her CGM shows TIR 73%, TAR 27%, TBR <1% with a GMI of 7.2%.  - Kadeidra Coryell has currently uncontrolled symptomatic type 2 DM since 62 years of age.   -Recent labs reviewed.  - I had a long discussion with her about the progressive nature of diabetes and the pathology behind its complications. -her diabetes is not currently complicated but she remains at a high risk for more acute and chronic complications which include CAD, CVA, CKD, retinopathy, and neuropathy. These are all discussed in detail with her.  The following Lifestyle Medicine recommendations according to American College of Lifestyle Medicine [facility]) were discussed and offered to patient and she agrees to start the journey:  A. Whole Foods, Plant-based plate comprising of fruits and vegetables, plant-based proteins, whole-grain carbohydrates was discussed in detail with the patient.   A list for source of those nutrients were also provided to the patient.  Patient will use only water or unsweetened tea for hydration. B.  The need to stay away from risky substances including alcohol, smoking; obtaining  7 to 9 hours of restorative sleep, at least 150 minutes of moderate intensity exercise weekly, the importance of healthy social connections,  and stress reduction techniques were discussed. C.  A full color page of  Calorie density of various food groups per pound showing examples of each food groups was provided to the patient.  - Nutritional counseling repeated at each appointment due to patients tendency to fall back in to old habits.  - The patient admits there is a room for improvement in their diet and drink choices. -  Suggestion is made for the patient to avoid simple carbohydrates from their diet including Cakes, Sweet Desserts / Pastries, Ice Cream, Soda (diet and regular), Sweet Tea, Candies, Chips, Cookies, Sweet Pastries, Store Bought Juices, Alcohol in Excess of 1-2 drinks a day, Artificial Sweeteners, Coffee Creamer, and "Sugar-free" Products. This will help patient to have stable blood glucose profile and potentially avoid unintended weight gain.   - I encouraged the patient to switch to unprocessed or minimally processed complex starch and increased protein intake (animal or plant source), fruits, and vegetables.   - Patient is advised to stick to a routine mealtimes to eat 3 meals a day and avoid unnecessary snacks (to snack only to correct hypoglycemia).  - I have approached her with the following individualized plan to manage her diabetes and patient agrees:   -  She has done well with less medications.  She is advised to continue Mounjaro 7.5 mg SQ weekly to and continue her Metformin 1000 mg ER PO daily with breakfast.  She can stay off Semglee for now.  I did encourage her to work on eating routine meals, says she has not had much of an appetite, but for metabolism we discussed the importance of eating routinely.  -she is encouraged to continue monitoring glucose 4 times daily (using her CGM), before meals and before bed, and to call the clinic if she has readings less than 70 or  above 200 for 3 tests in a row.   - Adjustment parameters are given to her for hypo and hyperglycemia in writing.  - her Wilder Glade was discontinued, due to polyuria and increased risk of UTI and yeast infections.  I also stopped her Novolog to prevent hypoglycemia.  - Specific targets for  A1c; LDL, HDL, and Triglycerides were discussed with the patient.  2) Blood Pressure /Hypertension:  her blood pressure is controlled to target.   she is advised to continue her current medications including Metoprolol 200 mg p.o. daily with breakfast, Lisinopril 20 mg po daily, Lasix 40 mg po daily.  3) Lipids/Hyperlipidemia:    There is no recent lipid panel to review.  she is advised to continue Simvastatin 40 mg daily at bedtime.  Side effects and precautions discussed with her.  Will recheck lipid panel prior to next visit.  4)  Weight/Diet:  her Body mass index is 30.52 kg/m.  -  clearly complicating her diabetes care.   she is a candidate for weight loss. I discussed with her the fact that loss of 5 - 10% of her  current body weight will have the most impact on her diabetes management.  Exercise, and detailed carbohydrates information provided  -  detailed on discharge instructions.  5) Chronic Care/Health Maintenance: -she is on ACEI/ARB and Statin medications and is encouraged to initiate and continue to follow up with Ophthalmology, Dentist, Podiatrist at least yearly or according to recommendations, and advised to stay away from smoking. I have recommended yearly flu vaccine and pneumonia vaccine at least every 5 years; moderate intensity exercise for up to 150 minutes weekly; and sleep for at least 7 hours a day.  - she is advised to maintain close follow up with Monico Blitz, MD for primary care needs, as well as her other providers for optimal and coordinated care.     I spent 28 minutes in the care of the patient today including review of labs from [place], Lipids, Thyroid Function, Hematology  (current and previous including abstractions from other facilities); face-to-face time discussing  her blood glucose readings/logs, discussing hypoglycemia and hyperglycemia episodes and symptoms, medications doses, her options of short and long term treatment based on the latest standards of care / guidelines;  discussion about incorporating lifestyle medicine;  and documenting the encounter. Risk reduction counseling performed per USPSTF guidelines to reduce obesity and cardiovascular risk factors.     Please refer to Patient Instructions for Blood Glucose Monitoring and Insulin/Medications Dosing Guide"  in media tab for additional information. Please  also refer to " Patient Self Inventory" in the Media  tab for reviewed elements of pertinent patient history.  Leta Jungling participated in the discussions, expressed understanding, and voiced agreement with the above plans.  All questions were answered to her satisfaction. she is encouraged to contact clinic should she have any questions or concerns prior to her return  visit.     Follow up plan: - Return in about 4 months (around 03/30/2023) for Diabetes F/U with A1c in office, Previsit labs, Bring meter and logs.   Ronny Bacon, [facility] [address] [place], [place] [address] Phone: [phone] Fax: [phone]  11/29/2022, 9:16 AM

## 2023-01-05 ENCOUNTER — Telehealth: Payer: Self-pay | Admitting: Nurse Practitioner

## 2023-01-05 NOTE — Telephone Encounter (Signed)
Pt notified and she will monitor and call back if once her cold has passed her readings are still high

## 2023-01-05 NOTE — Telephone Encounter (Signed)
It sounds like a typical response to infection.  The sugar typically goes up during those times but comes back to normal with the resolution of the viral/bacterial infection.  I do not feel that she needs any additional medications at this time.  Continue to encourage 3 well balanced meals per day, no sugary drinks, and plenty of water.

## 2023-01-05 NOTE — Telephone Encounter (Signed)
Pt states that she has had some high readings for the past 1 1/2 weeks. She uses a Dexcom G7 with a receiver so she could not give exact readings over the phone. States she has had quite a few between 200-300. Some that she remembers are:     Date Before breakfast Before lunch Before supper Bedtime  3/7    8:30 pm 303 150 during the night  3/8 157 before breakfast 208 at 10:15                 She states she has had a cold but has not had any new medications and nothing out of the ordinary to eat. She states she has been dizzy but has not had any lows.  Pt taking: Metformin XR 1000 mg qam      Mounjaro 7.'5mg'$  weekly

## 2023-02-15 ENCOUNTER — Other Ambulatory Visit: Payer: Self-pay | Admitting: Nurse Practitioner

## 2023-03-30 ENCOUNTER — Ambulatory Visit: Payer: 59 | Admitting: Nurse Practitioner

## 2023-03-30 DIAGNOSIS — Z7984 Long term (current) use of oral hypoglycemic drugs: Secondary | ICD-10-CM

## 2023-03-30 DIAGNOSIS — Z7985 Long-term (current) use of injectable non-insulin antidiabetic drugs: Secondary | ICD-10-CM

## 2023-03-30 DIAGNOSIS — I10 Essential (primary) hypertension: Secondary | ICD-10-CM

## 2023-03-30 DIAGNOSIS — E1165 Type 2 diabetes mellitus with hyperglycemia: Secondary | ICD-10-CM

## 2023-03-30 DIAGNOSIS — E782 Mixed hyperlipidemia: Secondary | ICD-10-CM

## 2023-04-11 ENCOUNTER — Other Ambulatory Visit: Payer: Self-pay | Admitting: Nurse Practitioner

## 2023-04-26 ENCOUNTER — Other Ambulatory Visit (HOSPITAL_COMMUNITY): Payer: Self-pay

## 2023-05-01 ENCOUNTER — Ambulatory Visit (INDEPENDENT_AMBULATORY_CARE_PROVIDER_SITE_OTHER): Payer: 59 | Admitting: Internal Medicine

## 2023-05-01 ENCOUNTER — Ambulatory Visit
Admission: RE | Admit: 2023-05-01 | Discharge: 2023-05-01 | Disposition: A | Discharge status: Home / Self Care | Payer: 59 | Source: Ambulatory Visit | Attending: Internal Medicine | Admitting: Internal Medicine

## 2023-05-01 ENCOUNTER — Other Ambulatory Visit (HOSPITAL_COMMUNITY)
Admission: RE | Admit: 2023-05-01 | Discharge: 2023-05-01 | Disposition: A | Discharge status: Home / Self Care | Payer: 59 | Source: Ambulatory Visit | Attending: Internal Medicine | Admitting: Internal Medicine

## 2023-05-01 ENCOUNTER — Encounter: Payer: Self-pay | Admitting: Internal Medicine

## 2023-05-01 VITALS — BP 120/84 | HR 74 | Ht 65.0 in | Wt 177.0 lb

## 2023-05-01 DIAGNOSIS — I350 Nonrheumatic aortic (valve) stenosis: Secondary | ICD-10-CM | POA: Diagnosis not present

## 2023-05-01 DIAGNOSIS — Q231 Congenital insufficiency of aortic valve: Secondary | ICD-10-CM | POA: Insufficient documentation

## 2023-05-01 DIAGNOSIS — Z01818 Encounter for other preprocedural examination: Secondary | ICD-10-CM | POA: Insufficient documentation

## 2023-05-01 LAB — CBC
HCT: 37.9 % (ref 36.0–46.0)
Hemoglobin: 12.5 g/dL (ref 12.0–15.0)
MCH: 28.7 pg (ref 26.0–34.0)
MCHC: 33 g/dL (ref 30.0–36.0)
MCV: 87.1 fL (ref 80.0–100.0)
Platelets: 218 10*3/uL (ref 150–400)
RBC: 4.35 MIL/uL (ref 3.87–5.11)
RDW: 13.6 % (ref 11.5–15.5)
WBC: 6.6 10*3/uL (ref 4.0–10.5)
nRBC: 0 % (ref 0.0–0.2)

## 2023-05-01 LAB — ECHOCARDIOGRAM COMPLETE
AR max vel: 0.54 cm2
AV Area VTI: 0.7 cm2
AV Area mean vel: 0.46 cm2
AV Mean grad: 53 mmHg
AV Peak grad: 76.7 mmHg
Ao pk vel: 4.38 m/s
Area-P 1/2: 5.13 cm2
Height: 65 in
P 1/2 time: 329 msec
S' Lateral: 2.9 cm
Weight: 2832 oz

## 2023-05-01 LAB — BASIC METABOLIC PANEL
Anion gap: 9 (ref 5–15)
BUN: 28 mg/dL — ABNORMAL HIGH (ref 8–23)
CO2: 24 mmol/L (ref 22–32)
Calcium: 9.2 mg/dL (ref 8.9–10.3)
Chloride: 102 mmol/L (ref 98–111)
Creatinine, Ser: 1.42 mg/dL — ABNORMAL HIGH (ref 0.44–1.00)
GFR, Estimated: 42 mL/min — ABNORMAL LOW (ref >60)
Glucose, Bld: 147 mg/dL — ABNORMAL HIGH (ref 70–99)
Potassium: 5.3 mmol/L — ABNORMAL HIGH (ref 3.5–5.1)
Sodium: 135 mmol/L (ref 135–145)

## 2023-05-01 NOTE — Addendum Note (Signed)
Addended by: Marlyn Corporal A on: 05/01/2023 04:55 PM   Modules accepted: Orders

## 2023-05-01 NOTE — Patient Instructions (Addendum)
Medication Instructions:  Your physician recommends that you continue on your current medications as directed. Please refer to the Current Medication list given to you today.   Labwork: cbc,bmet today  Testing/Procedures: Your physician has requested that you have an echocardiogram TODAY. Echocardiography is a painless test that uses sound waves to create images of your heart. It provides your doctor with information about the size and shape of your heart and how well your heart's chambers and valves are working. This procedure takes approximately one hour. There are no restrictions for this procedure. Please do NOT wear cologne, perfume, aftershave, or lotions (deodorant is allowed). Please arrive 15 minutes prior to your appointment time.  Your physician has requested that you have a cardiac catheterization. Cardiac catheterization is used to diagnose and/or treat various heart conditions. Doctors may recommend this procedure for a number of different reasons. The most common reason is to evaluate chest pain. Chest pain can be a symptom of coronary artery disease (CAD), and cardiac catheterization can show whether plaque is narrowing or blocking your heart's arteries. This procedure is also used to evaluate the valves, as well as measure the blood flow and oxygen levels in different parts of your heart. For further information please visit [email or web address] Please follow instruction sheet, as given.   Follow-Up: To be determined  Any Other Special Instructions Will Be Listed Below (If Applicable).  If you need a refill on your cardiac medications before your next appointment, please call your pharmacy.

## 2023-05-01 NOTE — H&P (View-Only) (Signed)
Cardiology Office Note   Date:  05/01/2023   ID:  Stacy Frost, DOB [birth year], MRN [id number]  PCP:  Stacy Peri, MD  Cardiologist:   Dietrich Pates, MD   Patient referred for evaluation of DOE       History of Present Illness: Stacy Frost is a 62 y.o. female with a history of T2DM, migraine HA, bicuspid AV.  Last echo in 2019 showed mean gradietn 20 mm Hx   Moderate AS    The pt says that over the past few months she hsa been SOB with exertion.   Symptoms got particularly bad over the past couple weeks   She was in the [employer] parking lot recently, got out of car, took several steps then had to stop  Couldn't breathe   Had to grab onto a cart. She has had some chest tightness when she is SOB   None at rest    She has been dizzy but denies syncope       Current Meds  Medication Sig   acetaminophen-codeine (TYLENOL #3) 300-30 MG tablet Take 1 tablet by mouth 2 (two) times daily as needed.   amoxicillin-clavulanate (AUGMENTIN) 500-125 MG tablet Take 1 tablet by mouth. Before dental appointments   aspirin EC 81 MG tablet Take 1 tablet (81 mg total) by mouth daily.   celecoxib (CELEBREX) 200 MG capsule Take 200 mg by mouth 2 (two) times daily.   citalopram (CELEXA) 40 MG tablet Take 40 mg by mouth daily.   clonazePAM (KLONOPIN) 0.5 MG tablet Take 0.5 mg by mouth 2 (two) times daily as needed.   Continuous Blood Gluc Receiver (DEXCOM G7 RECEIVER) DEVI USE 1 receiver continuously   Continuous Blood Gluc Sensor (DEXCOM G7 SENSOR) MISC APPLY 1 SENSOR EVERY 10 DAYS   cyclobenzaprine (FLEXERIL) 10 MG tablet Take 10 mg by mouth 3 (three) times daily.   furosemide (LASIX) 20 MG tablet Take 40 mg by mouth daily.    lisinopril (PRINIVIL,ZESTRIL) 20 MG tablet Take 20 mg by mouth daily.    metFORMIN (GLUCOPHAGE) 1000 MG tablet Take 1,000 mg by mouth 2 (two) times daily.   metoprolol (TOPROL-XL) 200 MG 24 hr tablet Take 200 mg by mouth daily.     MOUNJARO 7.5 MG/0.5ML Pen INJECT 7.5MG  UNDER THE SKIN  ONCE A WEEK   mupirocin ointment (BACTROBAN) 2 % Apply 1 Application topically 2 (two) times daily.   nitroGLYCERIN (NITROSTAT) 0.4 MG SL tablet Place 1 tablet (0.4 mg total) under the tongue every 5 (five) minutes as needed for chest pain.   potassium chloride SA (K-DUR,KLOR-CON) 20 MEQ tablet Take 20 mEq by mouth daily.    pramipexole (MIRAPEX) 0.5 MG tablet Take 0.5 mg by mouth at bedtime.   promethazine (PHENERGAN) 25 MG tablet Take 25 mg by mouth 2 (two) times daily as needed.   simvastatin (ZOCOR) 40 MG tablet Take 40 mg by mouth daily.   sulfamethoxazole-trimethoprim (BACTRIM DS) 800-160 MG tablet Take 1 tablet by mouth 2 (two) times daily.   traZODone (DESYREL) 100 MG tablet Take 100 mg by mouth at bedtime.     Allergies:   Amlodipine besylate, Morphine and codeine, Amitriptyline, Butorphanol tartrate, Compazine [prochlorperazine edisylate], Rocephin [ceftriaxone sodium in dextrose], and Sumatriptan   Past Medical History:  Diagnosis Date   Anxiety    Aortic stenosis    Mild to moderate   Bicuspid aortic valve    Essential hypertension, benign    GERD (gastroesophageal reflux disease)    History of  cardiac catheterization    Normal coronaries 2008   Hyperlipidemia    Migraine headache    MVP (mitral valve prolapse)    Obesity    Pain in joint, ankle and foot    Chronic   Palpitations    Documented PACs by previous monitoring   Restless leg syndrome    Skin tag    Type 2 diabetes mellitus (HCC)    Unilateral primary osteoarthritis, right knee 09/06/2016    Past Surgical History:  Procedure Laterality Date   ACHILLES TENDON REPAIR  2003   Left   CARDIAC CATHETERIZATION  2008   Normal coronary arteries; normal LV systolic function; mild dilation of aortic root; mild dilation of proximal ascending aorta; likely bicuspid aortic valve   CHOLECYSTECTOMY  2001   "Poor EF at 17%"   COLONOSCOPY N/A 07/17/2014   Procedure: COLONOSCOPY;  Surgeon: Malissa Hippo, MD;   [facility];  Service: Endoscopy;  Laterality: N/A;  105   ESOPHAGEAL DILATION N/A 06/11/2015   Procedure: ESOPHAGEAL DILATION;  Surgeon: Malissa Hippo, MD;  Location: [facility];  Service: Endoscopy;  Laterality: N/AElease Hashimoto 54/56/58   ESOPHAGOGASTRODUODENOSCOPY N/A 07/17/2014   Procedure: ESOPHAGOGASTRODUODENOSCOPY (EGD);  Surgeon: Malissa Hippo, MD;  [facility];  Service: Endoscopy;  Laterality: N/A;   ESOPHAGOGASTRODUODENOSCOPY (EGD) WITH PROPOFOL N/A 06/11/2015   Procedure: ESOPHAGOGASTRODUODENOSCOPY (EGD) WITH PROPOFOL;  Surgeon: Malissa Hippo, MD;  Location: [facility];  Service: Endoscopy;  Laterality: N/A;  730   HERNIA REPAIR     umbilical    KNEE ARTHROSCOPY WITH MEDIAL MENISECTOMY Right 07/02/2018   Procedure: RIGHT KNEE ARTHROSCOPY, DEBRIDEMENT, MEDIAL MENISECTOMY;  Surgeon: [name], MD;  [facility];  Service: Orthopedics;  Laterality: Right;   KNEE SURGERY     MALONEY DILATION N/A 07/17/2014   Procedure: [name] DILATION;  Surgeon: Malissa Hippo, MD;  [facility];  Service: Endoscopy;  Laterality: N/A;   UMBILICAL HERNIA REPAIR  2003     Social History:  The patient  reports that she has never smoked. She has never used smokeless tobacco. She reports that she does not drink alcohol and does not use drugs.   Family History:  The patient's family history includes Anxiety disorder in her daughter; Brain cancer in her paternal grandmother; Cancer in her mother; Depression in her daughter, father, and mother; Diabetes in her mother; Emphysema in her maternal grandfather; Heart attack in her father and paternal grandfather; Hypertension in her father; Stroke in her daughter; Transient ischemic attack in her brother.    ROS:  Please see the history of present illness. All other systems are reviewed and  Negative to the above problem except as noted.    PHYSICAL EXAM: VS:  BP 120/84   Pulse 74   Ht 5\' 5"  (1.651 m)   Wt 177 lb (80.3  kg)   SpO2 95%   BMI 29.45 kg/m   GEN: Well nourished, well developed, in no acute distress  HEENT: normal  Neck: JVP is normal   Mild radiating murmur  Cardiac: RRR; Gr III/VI mid peaking systolic murmur   Decresaed S2  No LE edema  Respiratory:  clear to auscultation bilaterally,  GI: soft, nontender, No hepatomegaly  MS: no deformity Moving all extremities   Skin: warm and dry, no rash Neuro:  Strength and sensation are intact Psych: euthymic mood, full affect   EKG:  EKG is ordered today.  SR 82 bpm  Septal  MI   Echo   2019  - Left ventricle: The cavity size was normal. Wall thickness was    increased in a pattern of mild LVH. There was mild focal basal    hypertrophy of the septum. Systolic function was vigorous. The    estimated ejection fraction was in the range of 65% to 70%. Wall    motion was normal; there were no regional wall motion    abnormalities. Doppler parameters are consistent with abnormal    left ventricular relaxation (grade 1 diastolic dysfunction).  - Aortic valve: Mildly calcified annulus. Functionally bicuspid;    moderately thickened, moderately calcified leaflets. There was    trivial regurgitation. Mean gradient (S): 20 mm Hg. Peak gradient    (S): 46 mm Hg. VTI ratio of LVOT to aortic valve: 0.27. Valve    area (VTI): 0.93 cm^2. Difficult to get accurate valve planimetry    but aortic stenosis looks to be at least in the moderate range.    Gradients have increased compared to the previous study.  - Mitral valve: There was trivial regurgitation.  - Tricuspid valve: There was trivial regurgitation.  - Pulmonary arteries: PA peak pressure: 21 mm Hg (S).  - Pericardium, extracardiac: There was no pericardial effusion.   Impressions:   - Mild LVH with LVEF 65-70% and grade 1 diastolic dysfunction.    Functionally bicuspid aortic valve, thickened and calcified with    trivial aortic regurgitation and at least moderate aortic    stenosis. Trivial  tricuspid regurgitation with estimated PASP 21    mm&'s mercury.   Lipid Panel    Component Value Date/Time   CHOL 165 06/29/2008 0842   TRIG 206 06/29/2008 0842   HDL 42 06/29/2008 0842   LDLCALC 82 06/29/2008 0842      Wt Readings from Last 3 Encounters:  05/01/23 177 lb (80.3 kg)  11/29/22 183 lb 6.4 oz (83.2 kg)  08/29/22 191 lb 3.2 oz (86.7 kg)      ASSESSMENT AND PLAN:  1  Aortic stenosis/   Pt with reported bicuspid valve.  Moderate AS on echo in 2019   Now with symptoms of chest pressure, SOB, dizziness  Very concerning   AS is critical      Pt just had an echo done    LVEF and RVEF appear normal   AV is calcified with restricted motion.  MEan gradient through the valve is 53 mm Hg     I have reviewed with patient    She is symptomatic from AS   I would reocmm R/L heart cath to define anatomy and pressures.  Then get evaluated for AVR  Will get labs today     Minimal activiyt until evaluated     2  HTN  BP is contrlled on current regimen  3  HL  LDL 89 on last check   ON statin   Follow for right now   Will need to be advanced   4  T2DM   Last A1C 6.2        Current medicines are reviewed at length with the patient today.  The patient does not have concerns regarding medicines.  Signed, Dietrich Pates, MD  05/01/2023 2:37 PM    [facility] [address] [place], [place], [place]  [address] Phone: [phone]; Fax: [phone]

## 2023-05-01 NOTE — Progress Notes (Signed)
 Cardiology Office Note   Date:  05/01/2023   ID:  Stacy Frost, DOB 06/11/1961, MRN [id number]  PCP:  Shah, Ashish, MD  Cardiologist:   Mele Sylvester, MD   Patient referred for evaluation of DOE       History of Present Illness: Stacy Frost is a 62 y.o. female with a history of T2DM, migraine HA, bicuspid AV.  Last echo in 2019 showed mean gradietn 20 mm Hx   Moderate AS    The pt says that over the past few months she hsa been SOB with exertion.   Symptoms got particularly bad over the past couple weeks   She was in the [employer] parking lot recently, got out of car, took several steps then had to stop  Couldn't breathe   Had to grab onto a cart. She has had some chest tightness when she is SOB   None at rest    She has been dizzy but denies syncope       Current Meds  Medication Sig   acetaminophen-codeine (TYLENOL #3) 300-30 MG tablet Take 1 tablet by mouth 2 (two) times daily as needed.   amoxicillin-clavulanate (AUGMENTIN) 500-125 MG tablet Take 1 tablet by mouth. Before dental appointments   aspirin EC 81 MG tablet Take 1 tablet (81 mg total) by mouth daily.   celecoxib (CELEBREX) 200 MG capsule Take 200 mg by mouth 2 (two) times daily.   citalopram (CELEXA) 40 MG tablet Take 40 mg by mouth daily.   clonazePAM (KLONOPIN) 0.5 MG tablet Take 0.5 mg by mouth 2 (two) times daily as needed.   Continuous Blood Gluc Receiver (DEXCOM G7 RECEIVER) DEVI USE 1 receiver continuously   Continuous Blood Gluc Sensor (DEXCOM G7 SENSOR) MISC APPLY 1 SENSOR EVERY 10 DAYS   cyclobenzaprine (FLEXERIL) 10 MG tablet Take 10 mg by mouth 3 (three) times daily.   furosemide (LASIX) 20 MG tablet Take 40 mg by mouth daily.    lisinopril (PRINIVIL,ZESTRIL) 20 MG tablet Take 20 mg by mouth daily.    metFORMIN (GLUCOPHAGE) 1000 MG tablet Take 1,000 mg by mouth 2 (two) times daily.   metoprolol (TOPROL-XL) 200 MG 24 hr tablet Take 200 mg by mouth daily.     MOUNJARO 7.5 MG/0.5ML Pen INJECT 7.5MG UNDER THE SKIN  ONCE A WEEK   mupirocin ointment (BACTROBAN) 2 % Apply 1 Application topically 2 (two) times daily.   nitroGLYCERIN (NITROSTAT) 0.4 MG SL tablet Place 1 tablet (0.4 mg total) under the tongue every 5 (five) minutes as needed for chest pain.   potassium chloride SA (K-DUR,KLOR-CON) 20 MEQ tablet Take 20 mEq by mouth daily.    pramipexole (MIRAPEX) 0.5 MG tablet Take 0.5 mg by mouth at bedtime.   promethazine (PHENERGAN) 25 MG tablet Take 25 mg by mouth 2 (two) times daily as needed.   simvastatin (ZOCOR) 40 MG tablet Take 40 mg by mouth daily.   sulfamethoxazole-trimethoprim (BACTRIM DS) 800-160 MG tablet Take 1 tablet by mouth 2 (two) times daily.   traZODone (DESYREL) 100 MG tablet Take 100 mg by mouth at bedtime.     Allergies:   Amlodipine besylate, Morphine and codeine, Amitriptyline, Butorphanol tartrate, Compazine [prochlorperazine edisylate], Rocephin [ceftriaxone sodium in dextrose], and Sumatriptan   Past Medical History:  Diagnosis Date   Anxiety    Aortic stenosis    Mild to moderate   Bicuspid aortic valve    Essential hypertension, benign    GERD (gastroesophageal reflux disease)    History of   cardiac catheterization    Normal coronaries 2008   Hyperlipidemia    Migraine headache    MVP (mitral valve prolapse)    Obesity    Pain in joint, ankle and foot    Chronic   Palpitations    Documented PACs by previous monitoring   Restless leg syndrome    Skin tag    Type 2 diabetes mellitus (HCC)    Unilateral primary osteoarthritis, right knee 09/06/2016    Past Surgical History:  Procedure Laterality Date   ACHILLES TENDON REPAIR  2003   Left   CARDIAC CATHETERIZATION  2008   Normal coronary arteries; normal LV systolic function; mild dilation of aortic root; mild dilation of proximal ascending aorta; likely bicuspid aortic valve   CHOLECYSTECTOMY  2001   "Poor EF at 17%"   COLONOSCOPY N/A 07/17/2014   Procedure: COLONOSCOPY;  Surgeon: Najeeb U Rehman, MD;   [facility];  Service: Endoscopy;  Laterality: N/A;  105   ESOPHAGEAL DILATION N/A 06/11/2015   Procedure: ESOPHAGEAL DILATION;  Surgeon: Najeeb U Rehman, MD;  Location: [facility];  Service: Endoscopy;  Laterality: N/A;  Maloney 54/56/58   ESOPHAGOGASTRODUODENOSCOPY N/A 07/17/2014   Procedure: ESOPHAGOGASTRODUODENOSCOPY (EGD);  Surgeon: Najeeb U Rehman, MD;  [facility];  Service: Endoscopy;  Laterality: N/A;   ESOPHAGOGASTRODUODENOSCOPY (EGD) WITH PROPOFOL N/A 06/11/2015   Procedure: ESOPHAGOGASTRODUODENOSCOPY (EGD) WITH PROPOFOL;  Surgeon: Najeeb U Rehman, MD;  Location: [facility];  Service: Endoscopy;  Laterality: N/A;  730   HERNIA REPAIR     umbilical    KNEE ARTHROSCOPY WITH MEDIAL MENISECTOMY Right 07/02/2018   Procedure: RIGHT KNEE ARTHROSCOPY, DEBRIDEMENT, MEDIAL MENISECTOMY;  Surgeon: Whitfield, Peter W, MD;  [facility];  Service: Orthopedics;  Laterality: Right;   KNEE SURGERY     MALONEY DILATION N/A 07/17/2014   Procedure: MALONEY DILATION;  Surgeon: Najeeb U Rehman, MD;  [facility];  Service: Endoscopy;  Laterality: N/A;   UMBILICAL HERNIA REPAIR  2003     Social History:  The patient  reports that she has never smoked. She has never used smokeless tobacco. She reports that she does not drink alcohol and does not use drugs.   Family History:  The patient's family history includes Anxiety disorder in her daughter; Brain cancer in her paternal grandmother; Cancer in her mother; Depression in her daughter, father, and mother; Diabetes in her mother; Emphysema in her maternal grandfather; Heart attack in her father and paternal grandfather; Hypertension in her father; Stroke in her daughter; Transient ischemic attack in her brother.    ROS:  Please see the history of present illness. All other systems are reviewed and  Negative to the above problem except as noted.    PHYSICAL EXAM: VS:  BP 120/84   Pulse 74   Ht 5' 5" (1.651 m)   Wt 177 lb (80.3  kg)   SpO2 95%   BMI 29.45 kg/m   GEN: Well nourished, well developed, in no acute distress  HEENT: normal  Neck: JVP is normal   Mild radiating murmur  Cardiac: RRR; Gr III/VI mid peaking systolic murmur   Decresaed S2  No LE edema  Respiratory:  clear to auscultation bilaterally,  GI: soft, nontender, No hepatomegaly  MS: no deformity Moving all extremities   Skin: warm and dry, no rash Neuro:  Strength and sensation are intact Psych: euthymic mood, full affect   EKG:  EKG is ordered today.  SR 82 bpm  Septal   MI   Echo   2019  - Left ventricle: The cavity size was normal. Wall thickness was    increased in a pattern of mild LVH. There was mild focal basal    hypertrophy of the septum. Systolic function was vigorous. The    estimated ejection fraction was in the range of 65% to 70%. Wall    motion was normal; there were no regional wall motion    abnormalities. Doppler parameters are consistent with abnormal    left ventricular relaxation (grade 1 diastolic dysfunction).  - Aortic valve: Mildly calcified annulus. Functionally bicuspid;    moderately thickened, moderately calcified leaflets. There was    trivial regurgitation. Mean gradient (S): 20 mm Hg. Peak gradient    (S): 46 mm Hg. VTI ratio of LVOT to aortic valve: 0.27. Valve    area (VTI): 0.93 cm^2. Difficult to get accurate valve planimetry    but aortic stenosis looks to be at least in the moderate range.    Gradients have increased compared to the previous study.  - Mitral valve: There was trivial regurgitation.  - Tricuspid valve: There was trivial regurgitation.  - Pulmonary arteries: PA peak pressure: 21 mm Hg (S).  - Pericardium, extracardiac: There was no pericardial effusion.   Impressions:   - Mild LVH with LVEF 65-70% and grade 1 diastolic dysfunction.    Functionally bicuspid aortic valve, thickened and calcified with    trivial aortic regurgitation and at least moderate aortic    stenosis. Trivial  tricuspid regurgitation with estimated PASP 21    mm&'s mercury.   Lipid Panel    Component Value Date/Time   CHOL 165 06/29/2008 0842   TRIG 206 06/29/2008 0842   HDL 42 06/29/2008 0842   LDLCALC 82 06/29/2008 0842      Wt Readings from Last 3 Encounters:  05/01/23 177 lb (80.3 kg)  11/29/22 183 lb 6.4 oz (83.2 kg)  08/29/22 191 lb 3.2 oz (86.7 kg)      ASSESSMENT AND PLAN:  1  Aortic stenosis/   Pt with reported bicuspid valve.  Moderate AS on echo in 2019   Now with symptoms of chest pressure, SOB, dizziness  Very concerning   AS is critical      Pt just had an echo done    LVEF and RVEF appear normal   AV is calcified with restricted motion.  MEan gradient through the valve is 53 mm Hg     I have reviewed with patient    She is symptomatic from AS   I would reocmm R/L heart cath to define anatomy and pressures.  Then get evaluated for AVR  Will get labs today     Minimal activiyt until evaluated     2  HTN  BP is contrlled on current regimen  3  HL  LDL 89 on last check   ON statin   Follow for right now   Will need to be advanced   4  T2DM   Last A1C 6.2        Current medicines are reviewed at length with the patient today.  The patient does not have concerns regarding medicines.  Signed, Malisa Ruggiero, MD  05/01/2023 2:37 PM    [facility] [address], [place], [place]  [address] Phone: [phone]; Fax: [phone]    

## 2023-05-02 ENCOUNTER — Encounter (HOSPITAL_COMMUNITY)
Admission: RE | Disposition: A | Discharge status: Home / Self Care | Payer: Self-pay | Source: Home / Self Care | Attending: Cardiology

## 2023-05-02 ENCOUNTER — Telehealth: Payer: Self-pay | Admitting: Internal Medicine

## 2023-05-02 ENCOUNTER — Ambulatory Visit (HOSPITAL_COMMUNITY)
Admission: RE | Admit: 2023-05-02 | Discharge: 2023-05-02 | Disposition: A | Discharge status: Home / Self Care | Payer: 59 | Attending: Cardiology | Admitting: Cardiology

## 2023-05-02 ENCOUNTER — Other Ambulatory Visit: Payer: Self-pay

## 2023-05-02 DIAGNOSIS — E785 Hyperlipidemia, unspecified: Secondary | ICD-10-CM | POA: Insufficient documentation

## 2023-05-02 DIAGNOSIS — Z7985 Long-term (current) use of injectable non-insulin antidiabetic drugs: Secondary | ICD-10-CM | POA: Diagnosis not present

## 2023-05-02 DIAGNOSIS — Z01818 Encounter for other preprocedural examination: Secondary | ICD-10-CM

## 2023-05-02 DIAGNOSIS — R0609 Other forms of dyspnea: Secondary | ICD-10-CM | POA: Diagnosis not present

## 2023-05-02 DIAGNOSIS — E119 Type 2 diabetes mellitus without complications: Secondary | ICD-10-CM | POA: Diagnosis not present

## 2023-05-02 DIAGNOSIS — I10 Essential (primary) hypertension: Secondary | ICD-10-CM | POA: Diagnosis not present

## 2023-05-02 DIAGNOSIS — Z7984 Long term (current) use of oral hypoglycemic drugs: Secondary | ICD-10-CM | POA: Insufficient documentation

## 2023-05-02 DIAGNOSIS — Z79899 Other long term (current) drug therapy: Secondary | ICD-10-CM | POA: Insufficient documentation

## 2023-05-02 DIAGNOSIS — I350 Nonrheumatic aortic (valve) stenosis: Secondary | ICD-10-CM

## 2023-05-02 HISTORY — PX: RIGHT/LEFT HEART CATH AND CORONARY ANGIOGRAPHY: CATH118266

## 2023-05-02 LAB — POCT I-STAT 7, (LYTES, BLD GAS, ICA,H+H)
Acid-base deficit: 4 mmol/L — ABNORMAL HIGH (ref 0.0–2.0)
Bicarbonate: 20.9 mmol/L (ref 20.0–28.0)
Calcium, Ion: 1.16 mmol/L (ref 1.15–1.40)
HCT: 32 % — ABNORMAL LOW (ref 36.0–46.0)
Hemoglobin: 10.9 g/dL — ABNORMAL LOW (ref 12.0–15.0)
O2 Saturation: 94 %
Potassium: 5 mmol/L (ref 3.5–5.1)
Sodium: 136 mmol/L (ref 135–145)
TCO2: 22 mmol/L (ref 22–32)
pCO2 arterial: 35.7 mmHg (ref 32–48)
pH, Arterial: 7.376 (ref 7.35–7.45)
pO2, Arterial: 70 mmHg — ABNORMAL LOW (ref 83–108)

## 2023-05-02 LAB — BASIC METABOLIC PANEL
Anion gap: 11 (ref 5–15)
BUN: 27 mg/dL — ABNORMAL HIGH (ref 8–23)
CO2: 24 mmol/L (ref 22–32)
Calcium: 9.2 mg/dL (ref 8.9–10.3)
Chloride: 100 mmol/L (ref 98–111)
Creatinine, Ser: 1.47 mg/dL — ABNORMAL HIGH (ref 0.44–1.00)
GFR, Estimated: 40 mL/min — ABNORMAL LOW (ref >60)
Glucose, Bld: 156 mg/dL — ABNORMAL HIGH (ref 70–99)
Potassium: 5.4 mmol/L — ABNORMAL HIGH (ref 3.5–5.1)
Sodium: 135 mmol/L (ref 135–145)

## 2023-05-02 LAB — GLUCOSE, CAPILLARY
Glucose-Capillary: 138 mg/dL — ABNORMAL HIGH (ref 70–99)
Glucose-Capillary: 107 mg/dL — ABNORMAL HIGH (ref 70–99)

## 2023-05-02 LAB — POCT I-STAT EG7
Acid-base deficit: 3 mmol/L — ABNORMAL HIGH (ref 0.0–2.0)
Acid-base deficit: 3 mmol/L — ABNORMAL HIGH (ref 0.0–2.0)
Bicarbonate: 22.3 mmol/L (ref 20.0–28.0)
Bicarbonate: 22.4 mmol/L (ref 20.0–28.0)
Calcium, Ion: 1.16 mmol/L (ref 1.15–1.40)
Calcium, Ion: 1.2 mmol/L (ref 1.15–1.40)
HCT: 32 % — ABNORMAL LOW (ref 36.0–46.0)
HCT: 33 % — ABNORMAL LOW (ref 36.0–46.0)
Hemoglobin: 10.9 g/dL — ABNORMAL LOW (ref 12.0–15.0)
Hemoglobin: 11.2 g/dL — ABNORMAL LOW (ref 12.0–15.0)
O2 Saturation: 66 %
O2 Saturation: 67 %
Potassium: 4.9 mmol/L (ref 3.5–5.1)
Potassium: 5 mmol/L (ref 3.5–5.1)
Sodium: 136 mmol/L (ref 135–145)
Sodium: 137 mmol/L (ref 135–145)
TCO2: 24 mmol/L (ref 22–32)
TCO2: 24 mmol/L (ref 22–32)
pCO2, Ven: 39.9 mmHg — ABNORMAL LOW (ref 44–60)
pCO2, Ven: 40 mmHg — ABNORMAL LOW (ref 44–60)
pH, Ven: 7.355 (ref 7.25–7.43)
pH, Ven: 7.357 (ref 7.25–7.43)
pO2, Ven: 36 mmHg (ref 32–45)
pO2, Ven: 36 mmHg (ref 32–45)

## 2023-05-02 SURGERY — RIGHT/LEFT HEART CATH AND CORONARY ANGIOGRAPHY
Anesthesia: LOCAL

## 2023-05-02 MED ORDER — VERAPAMIL HCL 2.5 MG/ML IV SOLN
INTRAVENOUS | Status: AC
  Filled 2023-05-02: qty 2

## 2023-05-02 MED ORDER — FENTANYL CITRATE (PF) 100 MCG/2ML IJ SOLN
INTRAMUSCULAR | Status: DC | PRN
  Administered 2023-05-02: 25 ug via INTRAVENOUS

## 2023-05-02 MED ORDER — HEPARIN SODIUM (PORCINE) 1000 UNIT/ML IJ SOLN
INTRAMUSCULAR | Status: DC | PRN
  Administered 2023-05-02: 4000 [IU] via INTRAVENOUS

## 2023-05-02 MED ORDER — LIDOCAINE HCL (PF) 1 % IJ SOLN
INTRAMUSCULAR | Status: AC
  Filled 2023-05-02: qty 30

## 2023-05-02 MED ORDER — SODIUM CHLORIDE 0.9 % WEIGHT BASED INFUSION: 1.0000 mL/kg/h | INTRAVENOUS | Status: DC

## 2023-05-02 MED ORDER — SODIUM CHLORIDE 0.9% FLUSH: 3.0000 mL | Freq: Two times a day (BID) | INTRAVENOUS | Status: DC

## 2023-05-02 MED ORDER — SODIUM CHLORIDE 0.9% FLUSH: 3.0000 mL | INTRAVENOUS | Status: DC | PRN

## 2023-05-02 MED ORDER — FENTANYL CITRATE (PF) 100 MCG/2ML IJ SOLN
INTRAMUSCULAR | Status: AC
  Filled 2023-05-02: qty 2

## 2023-05-02 MED ORDER — MIDAZOLAM HCL 2 MG/2ML IJ SOLN
INTRAMUSCULAR | Status: DC | PRN
  Administered 2023-05-02: 1 mg via INTRAVENOUS

## 2023-05-02 MED ORDER — MIDAZOLAM HCL 2 MG/2ML IJ SOLN
INTRAMUSCULAR | Status: AC
  Filled 2023-05-02: qty 2

## 2023-05-02 MED ORDER — SODIUM CHLORIDE 0.9 % IV SOLN: 250.0000 mL | INTRAVENOUS | Status: DC | PRN

## 2023-05-02 MED ORDER — ASPIRIN 81 MG PO CHEW: 81.0000 mg | CHEWABLE_TABLET | ORAL | Status: DC

## 2023-05-02 MED ORDER — LIDOCAINE HCL (PF) 1 % IJ SOLN
INTRAMUSCULAR | Status: DC | PRN
  Administered 2023-05-02 (×2): 2 mL via INTRADERMAL

## 2023-05-02 MED ORDER — METFORMIN HCL 1000 MG PO TABS: 1000.0000 mg | ORAL_TABLET | Freq: Two times a day (BID) | ORAL | Status: DC

## 2023-05-02 MED ORDER — HEPARIN SODIUM (PORCINE) 1000 UNIT/ML IJ SOLN
INTRAMUSCULAR | Status: AC
  Filled 2023-05-02: qty 10

## 2023-05-02 MED ORDER — ACETAMINOPHEN 325 MG PO TABS: 650.0000 mg | ORAL_TABLET | ORAL | Status: DC | PRN

## 2023-05-02 MED ORDER — IOHEXOL 350 MG/ML SOLN
INTRAVENOUS | Status: DC | PRN
  Administered 2023-05-02: 70 mL

## 2023-05-02 MED ORDER — ONDANSETRON HCL 4 MG/2ML IJ SOLN: 4.0000 mg | Freq: Four times a day (QID) | INTRAMUSCULAR | Status: DC | PRN

## 2023-05-02 MED ORDER — VERAPAMIL HCL 2.5 MG/ML IV SOLN
INTRAVENOUS | Status: DC | PRN
  Administered 2023-05-02: 10 mL via INTRA_ARTERIAL

## 2023-05-02 MED ORDER — SODIUM CHLORIDE 0.9 % WEIGHT BASED INFUSION
3.0000 mL/kg/h | INTRAVENOUS | Status: AC
  Administered 2023-05-02: 3 mL/kg/h via INTRAVENOUS

## 2023-05-02 MED ORDER — HEPARIN (PORCINE) IN NACL 1000-0.9 UT/500ML-% IV SOLN
INTRAVENOUS | Status: DC | PRN
  Administered 2023-05-02 (×2): 500 mL

## 2023-05-02 SURGICAL SUPPLY — 14 items
CATH 5FR JL3.5 JR4 ANG PIG MP (CATHETERS) IMPLANT
CATH BALLN WEDGE 5F 110CM (CATHETERS) IMPLANT
CATH INFINITI 5FR AL1 (CATHETERS) IMPLANT
DEVICE RAD COMP TR BAND LRG (VASCULAR PRODUCTS) IMPLANT
GLIDESHEATH SLEND SS 6F .021 (SHEATH) IMPLANT
GUIDEWIRE INQWIRE 1.5J.035X260 (WIRE) IMPLANT
INQWIRE 1.5J .035X260CM (WIRE) ×1
KIT HEART LEFT (KITS) ×1 IMPLANT
PACK CARDIAC CATHETERIZATION (CUSTOM PROCEDURE TRAY) ×1 IMPLANT
SHEATH GLIDE SLENDER 4/5FR (SHEATH) IMPLANT
SHEATH PROBE COVER 6X72 (BAG) IMPLANT
TRANSDUCER W/STOPCOCK (MISCELLANEOUS) ×1 IMPLANT
TUBING CIL FLEX 10 FLL-RA (TUBING) ×1 IMPLANT
WIRE EMERALD ST .035X150CM (WIRE) IMPLANT

## 2023-05-02 NOTE — Telephone Encounter (Signed)
Pt requesting a callback from provider herself regarding Heart Cath that was done yesterday. Please advise

## 2023-05-02 NOTE — Interval H&P Note (Signed)
History and Physical Interval Note:  05/02/2023 10:03 AM  Stacy Frost  has presented today for surgery, with the diagnosis of pre op - aortic stenosis.  The various methods of treatment have been discussed with the patient and family. After consideration of risks, benefits and other options for treatment, the patient has consented to  Procedure(s): RIGHT/LEFT HEART CATH AND CORONARY ANGIOGRAPHY (N/A) as a surgical intervention.  The patient's history has been reviewed, patient examined, no change in status, stable for surgery.  I have reviewed the patient's chart and labs.  Questions were answered to the patient's satisfaction.   Cath Lab Visit (complete for each Cath Lab visit)     Theron Arista [facility] 05/02/2023 10:03 AM

## 2023-05-02 NOTE — Telephone Encounter (Signed)
Family states they were told her f/u appointment made in a couple weeks is ok, nothing is emergent.   They want to go to the beach as planned in 3 days.    I sent staff message and secure chat to Dr.Ross

## 2023-05-02 NOTE — Progress Notes (Signed)
Nicholos Johns PA notified of b/p's 90's systolic and she was in to see client and client advised to drink lots of fluids

## 2023-05-04 ENCOUNTER — Encounter (HOSPITAL_COMMUNITY): Payer: Self-pay | Admitting: Cardiology

## 2023-05-04 NOTE — Telephone Encounter (Signed)
I spoke with spouse, Dr.Ross did speak with them.

## 2023-05-09 ENCOUNTER — Telehealth: Payer: Self-pay

## 2023-05-09 NOTE — Telephone Encounter (Signed)
Pharmacy Patient Advocate Encounter  Prior Authorization for Stacy Frost has been APPROVED   Effective through 05/03/24

## 2023-05-11 ENCOUNTER — Encounter (HOSPITAL_COMMUNITY): Payer: Self-pay

## 2023-05-11 ENCOUNTER — Other Ambulatory Visit: Payer: Self-pay

## 2023-05-11 ENCOUNTER — Institutional Professional Consult (permissible substitution) (INDEPENDENT_AMBULATORY_CARE_PROVIDER_SITE_OTHER): Payer: 59 | Admitting: Thoracic Surgery (Cardiothoracic Vascular Surgery)

## 2023-05-11 ENCOUNTER — Inpatient Hospital Stay (HOSPITAL_COMMUNITY): Payer: 59

## 2023-05-11 ENCOUNTER — Inpatient Hospital Stay (HOSPITAL_COMMUNITY)
Admission: AD | Admit: 2023-05-11 | Discharge: 2023-05-21 | DRG: 220 | Disposition: A | Discharge status: Home / Self Care | Payer: 59 | Source: Ambulatory Visit | Attending: Thoracic Surgery (Cardiothoracic Vascular Surgery) | Admitting: Thoracic Surgery (Cardiothoracic Vascular Surgery)

## 2023-05-11 ENCOUNTER — Encounter (HOSPITAL_COMMUNITY): Payer: Self-pay | Admitting: Thoracic Surgery (Cardiothoracic Vascular Surgery)

## 2023-05-11 VITALS — BP 101/68 | HR 89 | Resp 20 | Ht 66.0 in | Wt 177.0 lb

## 2023-05-11 DIAGNOSIS — I9581 Postprocedural hypotension: Secondary | ICD-10-CM | POA: Diagnosis not present

## 2023-05-11 DIAGNOSIS — I10 Essential (primary) hypertension: Secondary | ICD-10-CM | POA: Diagnosis present

## 2023-05-11 DIAGNOSIS — G2581 Restless legs syndrome: Secondary | ICD-10-CM | POA: Diagnosis present

## 2023-05-11 DIAGNOSIS — E8770 Fluid overload, unspecified: Secondary | ICD-10-CM | POA: Diagnosis not present

## 2023-05-11 DIAGNOSIS — Z825 Family history of asthma and other chronic lower respiratory diseases: Secondary | ICD-10-CM

## 2023-05-11 DIAGNOSIS — I350 Nonrheumatic aortic (valve) stenosis: Secondary | ICD-10-CM | POA: Diagnosis not present

## 2023-05-11 DIAGNOSIS — E1165 Type 2 diabetes mellitus with hyperglycemia: Secondary | ICD-10-CM | POA: Diagnosis present

## 2023-05-11 DIAGNOSIS — G43909 Migraine, unspecified, not intractable, without status migrainosus: Secondary | ICD-10-CM | POA: Diagnosis present

## 2023-05-11 DIAGNOSIS — K5900 Constipation, unspecified: Secondary | ICD-10-CM | POA: Diagnosis present

## 2023-05-11 DIAGNOSIS — Z7985 Long-term (current) use of injectable non-insulin antidiabetic drugs: Secondary | ICD-10-CM | POA: Diagnosis not present

## 2023-05-11 DIAGNOSIS — I358 Other nonrheumatic aortic valve disorders: Secondary | ICD-10-CM | POA: Diagnosis not present

## 2023-05-11 DIAGNOSIS — Z6832 Body mass index (BMI) 32.0-32.9, adult: Secondary | ICD-10-CM

## 2023-05-11 DIAGNOSIS — Z818 Family history of other mental and behavioral disorders: Secondary | ICD-10-CM

## 2023-05-11 DIAGNOSIS — Z7982 Long term (current) use of aspirin: Secondary | ICD-10-CM | POA: Diagnosis not present

## 2023-05-11 DIAGNOSIS — Z888 Allergy status to other drugs, medicaments and biological substances status: Secondary | ICD-10-CM

## 2023-05-11 DIAGNOSIS — I341 Nonrheumatic mitral (valve) prolapse: Secondary | ICD-10-CM | POA: Diagnosis present

## 2023-05-11 DIAGNOSIS — R296 Repeated falls: Secondary | ICD-10-CM | POA: Diagnosis present

## 2023-05-11 DIAGNOSIS — G8929 Other chronic pain: Secondary | ICD-10-CM | POA: Diagnosis present

## 2023-05-11 DIAGNOSIS — E785 Hyperlipidemia, unspecified: Secondary | ICD-10-CM | POA: Diagnosis present

## 2023-05-11 DIAGNOSIS — R55 Syncope and collapse: Secondary | ICD-10-CM | POA: Diagnosis present

## 2023-05-11 DIAGNOSIS — M1711 Unilateral primary osteoarthritis, right knee: Secondary | ICD-10-CM | POA: Diagnosis present

## 2023-05-11 DIAGNOSIS — D62 Acute posthemorrhagic anemia: Secondary | ICD-10-CM | POA: Diagnosis not present

## 2023-05-11 DIAGNOSIS — R0602 Shortness of breath: Secondary | ICD-10-CM | POA: Diagnosis not present

## 2023-05-11 DIAGNOSIS — Z79899 Other long term (current) drug therapy: Secondary | ICD-10-CM

## 2023-05-11 DIAGNOSIS — F419 Anxiety disorder, unspecified: Secondary | ICD-10-CM | POA: Diagnosis present

## 2023-05-11 DIAGNOSIS — Q231 Congenital insufficiency of aortic valve: Secondary | ICD-10-CM

## 2023-05-11 DIAGNOSIS — Z8249 Family history of ischemic heart disease and other diseases of the circulatory system: Secondary | ICD-10-CM

## 2023-05-11 DIAGNOSIS — Z808 Family history of malignant neoplasm of other organs or systems: Secondary | ICD-10-CM

## 2023-05-11 DIAGNOSIS — K219 Gastro-esophageal reflux disease without esophagitis: Secondary | ICD-10-CM | POA: Diagnosis present

## 2023-05-11 DIAGNOSIS — Z885 Allergy status to narcotic agent status: Secondary | ICD-10-CM

## 2023-05-11 DIAGNOSIS — R911 Solitary pulmonary nodule: Secondary | ICD-10-CM | POA: Diagnosis present

## 2023-05-11 DIAGNOSIS — Z823 Family history of stroke: Secondary | ICD-10-CM

## 2023-05-11 DIAGNOSIS — Z7984 Long term (current) use of oral hypoglycemic drugs: Secondary | ICD-10-CM | POA: Diagnosis not present

## 2023-05-11 DIAGNOSIS — D696 Thrombocytopenia, unspecified: Secondary | ICD-10-CM | POA: Diagnosis not present

## 2023-05-11 DIAGNOSIS — Z953 Presence of xenogenic heart valve: Secondary | ICD-10-CM

## 2023-05-11 DIAGNOSIS — E669 Obesity, unspecified: Secondary | ICD-10-CM | POA: Diagnosis present

## 2023-05-11 DIAGNOSIS — Z833 Family history of diabetes mellitus: Secondary | ICD-10-CM

## 2023-05-11 DIAGNOSIS — E119 Type 2 diabetes mellitus without complications: Secondary | ICD-10-CM | POA: Diagnosis not present

## 2023-05-11 LAB — URINALYSIS, ROUTINE W REFLEX MICROSCOPIC
Bilirubin Urine: NEGATIVE
Glucose, UA: 50 mg/dL — AB
Hgb urine dipstick: NEGATIVE
Ketones, ur: NEGATIVE mg/dL
Leukocytes,Ua: NEGATIVE
Nitrite: NEGATIVE
Protein, ur: NEGATIVE mg/dL
Specific Gravity, Urine: 1.008 (ref 1.005–1.030)
pH: 5 (ref 5.0–8.0)

## 2023-05-11 LAB — GLUCOSE, CAPILLARY
Glucose-Capillary: 254 mg/dL — ABNORMAL HIGH (ref 70–99)
Glucose-Capillary: 190 mg/dL — ABNORMAL HIGH (ref 70–99)
Glucose-Capillary: 168 mg/dL — ABNORMAL HIGH (ref 70–99)

## 2023-05-11 MED ORDER — HEPARIN 30,000 UNITS/1000 ML (OHS) CELLSAVER SOLUTION
Status: DC
  Filled 2023-05-11: qty 1000

## 2023-05-11 MED ORDER — VANCOMYCIN HCL 1250 MG/250ML IV SOLN
1250.0000 mg | INTRAVENOUS | Status: AC
  Administered 2023-05-14: 1250 mg via INTRAVENOUS
  Filled 2023-05-11: qty 250

## 2023-05-11 MED ORDER — PLASMA-LYTE A IV SOLN
INTRAVENOUS | Status: DC
  Filled 2023-05-11: qty 2.5

## 2023-05-11 MED ORDER — NOREPINEPHRINE 4 MG/250ML-% IV SOLN
0.0000 ug/min | INTRAVENOUS | Status: DC
  Filled 2023-05-11: qty 250

## 2023-05-11 MED ORDER — PHENYLEPHRINE HCL-NACL 20-0.9 MG/250ML-% IV SOLN
30.0000 ug/min | INTRAVENOUS | Status: AC
  Administered 2023-05-14: 70 ug/min via INTRAVENOUS
  Administered 2023-05-14: 80 ug/min via INTRAVENOUS
  Administered 2023-05-14: 100 ug/min via INTRAVENOUS
  Filled 2023-05-11: qty 250

## 2023-05-11 MED ORDER — DEXMEDETOMIDINE HCL IN NACL 400 MCG/100ML IV SOLN
0.1000 ug/kg/h | INTRAVENOUS | Status: AC
  Administered 2023-05-14: .7 ug/kg/h via INTRAVENOUS
  Filled 2023-05-11: qty 100

## 2023-05-11 MED ORDER — PRAMIPEXOLE DIHYDROCHLORIDE 0.25 MG PO TABS
0.5000 mg | ORAL_TABLET | Freq: Every day | ORAL | Status: DC
  Administered 2023-05-11 – 2023-05-20 (×10): 0.5 mg via ORAL
  Filled 2023-05-11 (×11): qty 2

## 2023-05-11 MED ORDER — ACETAMINOPHEN-CODEINE 300-30 MG PO TABS
1.0000 | ORAL_TABLET | ORAL | Status: DC | PRN
  Administered 2023-05-12 (×3): 1 via ORAL
  Filled 2023-05-11 (×4): qty 1

## 2023-05-11 MED ORDER — CITALOPRAM HYDROBROMIDE 20 MG PO TABS
40.0000 mg | ORAL_TABLET | Freq: Every day | ORAL | Status: DC
  Administered 2023-05-12 – 2023-05-21 (×9): 40 mg via ORAL
  Filled 2023-05-11 (×9): qty 2

## 2023-05-11 MED ORDER — CYCLOBENZAPRINE HCL 10 MG PO TABS
10.0000 mg | ORAL_TABLET | Freq: Three times a day (TID) | ORAL | Status: DC
  Administered 2023-05-11 – 2023-05-18 (×22): 10 mg via ORAL
  Filled 2023-05-11 (×23): qty 1

## 2023-05-11 MED ORDER — CLONAZEPAM 0.5 MG PO TABS
0.5000 mg | ORAL_TABLET | Freq: Two times a day (BID) | ORAL | Status: DC | PRN
  Administered 2023-05-14: 0.5 mg via ORAL
  Filled 2023-05-11 (×2): qty 1

## 2023-05-11 MED ORDER — EPINEPHRINE HCL 5 MG/250ML IV SOLN IN NS
0.0000 ug/min | INTRAVENOUS | Status: DC
  Filled 2023-05-11: qty 250

## 2023-05-11 MED ORDER — NITROGLYCERIN IN D5W 200-5 MCG/ML-% IV SOLN
2.0000 ug/min | INTRAVENOUS | Status: DC
  Filled 2023-05-11: qty 250

## 2023-05-11 MED ORDER — TRANEXAMIC ACID (OHS) BOLUS VIA INFUSION
15.0000 mg/kg | INTRAVENOUS | Status: AC
  Administered 2023-05-14: 1204.5 mg via INTRAVENOUS
  Filled 2023-05-11: qty 1205

## 2023-05-11 MED ORDER — INSULIN REGULAR(HUMAN) IN NACL 100-0.9 UT/100ML-% IV SOLN
INTRAVENOUS | Status: AC
  Administered 2023-05-14: 5.5 [IU]/h via INTRAVENOUS
  Filled 2023-05-11: qty 100

## 2023-05-11 MED ORDER — ASPIRIN 81 MG PO CHEW
81.0000 mg | CHEWABLE_TABLET | Freq: Every day | ORAL | Status: DC
  Administered 2023-05-12 – 2023-05-13 (×2): 81 mg via ORAL
  Filled 2023-05-11 (×2): qty 1

## 2023-05-11 MED ORDER — INSULIN ASPART 100 UNIT/ML IJ SOLN
0.0000 [IU] | Freq: Every day | INTRAMUSCULAR | Status: DC
  Administered 2023-05-15: 2 [IU] via SUBCUTANEOUS
  Administered 2023-05-17: 1 [IU] via SUBCUTANEOUS
  Administered 2023-05-20: 2 [IU] via SUBCUTANEOUS

## 2023-05-11 MED ORDER — POTASSIUM CHLORIDE 2 MEQ/ML IV SOLN
80.0000 meq | INTRAVENOUS | Status: DC
  Filled 2023-05-11: qty 40

## 2023-05-11 MED ORDER — MANNITOL 20 % IV SOLN
INTRAVENOUS | Status: DC
  Filled 2023-05-11: qty 13

## 2023-05-11 MED ORDER — TRANEXAMIC ACID (OHS) PUMP PRIME SOLUTION
2.0000 mg/kg | INTRAVENOUS | Status: DC
  Filled 2023-05-11: qty 1.61

## 2023-05-11 MED ORDER — PROMETHAZINE HCL 25 MG PO TABS: 25.0000 mg | ORAL_TABLET | Freq: Four times a day (QID) | ORAL | Status: DC | PRN

## 2023-05-11 MED ORDER — TRANEXAMIC ACID 1000 MG/10ML IV SOLN
1.5000 mg/kg/h | INTRAVENOUS | Status: AC
  Administered 2023-05-14: 1.5 mg/kg/h via INTRAVENOUS
  Filled 2023-05-11: qty 25

## 2023-05-11 MED ORDER — IOHEXOL 350 MG/ML SOLN
60.0000 mL | Freq: Once | INTRAVENOUS | Status: AC | PRN
  Administered 2023-05-11: 60 mL via INTRAVENOUS

## 2023-05-11 MED ORDER — INSULIN ASPART 100 UNIT/ML IJ SOLN
0.0000 [IU] | Freq: Three times a day (TID) | INTRAMUSCULAR | Status: DC
  Administered 2023-05-11 – 2023-05-12 (×2): 2 [IU] via SUBCUTANEOUS
  Administered 2023-05-12: 1 [IU] via SUBCUTANEOUS
  Administered 2023-05-12 – 2023-05-13 (×2): 3 [IU] via SUBCUTANEOUS
  Administered 2023-05-13 (×2): 1 [IU] via SUBCUTANEOUS
  Administered 2023-05-15: 5 [IU] via SUBCUTANEOUS
  Administered 2023-05-15: 2 [IU] via SUBCUTANEOUS
  Administered 2023-05-15 – 2023-05-16 (×2): 3 [IU] via SUBCUTANEOUS
  Administered 2023-05-16 (×2): 2 [IU] via SUBCUTANEOUS
  Administered 2023-05-17: 1 [IU] via SUBCUTANEOUS
  Administered 2023-05-17: 2 [IU] via SUBCUTANEOUS
  Administered 2023-05-18: 1 [IU] via SUBCUTANEOUS
  Administered 2023-05-18: 2 [IU] via SUBCUTANEOUS
  Administered 2023-05-18: 1 [IU] via SUBCUTANEOUS
  Administered 2023-05-19: 2 [IU] via SUBCUTANEOUS
  Administered 2023-05-20 (×2): 1 [IU] via SUBCUTANEOUS
  Administered 2023-05-21: 2 [IU] via SUBCUTANEOUS

## 2023-05-11 MED ORDER — MILRINONE LACTATE IN DEXTROSE 20-5 MG/100ML-% IV SOLN
0.3000 ug/kg/min | INTRAVENOUS | Status: DC
  Filled 2023-05-11: qty 100

## 2023-05-11 MED ORDER — LEVOFLOXACIN IN D5W 500 MG/100ML IV SOLN
500.0000 mg | INTRAVENOUS | Status: AC
  Administered 2023-05-14: 500 mg via INTRAVENOUS
  Filled 2023-05-11: qty 100

## 2023-05-11 MED ORDER — METOPROLOL SUCCINATE ER 100 MG PO TB24
100.0000 mg | ORAL_TABLET | Freq: Every day | ORAL | Status: DC
  Filled 2023-05-11: qty 1

## 2023-05-11 MED ORDER — FUROSEMIDE 40 MG PO TABS
40.0000 mg | ORAL_TABLET | Freq: Every day | ORAL | Status: DC
  Administered 2023-05-12 – 2023-05-13 (×2): 40 mg via ORAL
  Filled 2023-05-11 (×2): qty 1

## 2023-05-11 MED ORDER — NITROGLYCERIN 0.4 MG SL SUBL: 0.4000 mg | SUBLINGUAL_TABLET | SUBLINGUAL | Status: DC | PRN

## 2023-05-11 MED ORDER — SIMVASTATIN 20 MG PO TABS
40.0000 mg | ORAL_TABLET | Freq: Every day | ORAL | Status: DC
  Administered 2023-05-12 – 2023-05-17 (×6): 40 mg via ORAL
  Filled 2023-05-11 (×6): qty 2

## 2023-05-11 MED ORDER — TRAZODONE HCL 100 MG PO TABS
100.0000 mg | ORAL_TABLET | Freq: Every day | ORAL | Status: DC
  Administered 2023-05-11 – 2023-05-20 (×10): 100 mg via ORAL
  Filled 2023-05-11 (×3): qty 1
  Filled 2023-05-11: qty 2
  Filled 2023-05-11 (×2): qty 1
  Filled 2023-05-11 (×2): qty 2
  Filled 2023-05-11 (×2): qty 1

## 2023-05-11 MED ORDER — POTASSIUM CHLORIDE CRYS ER 20 MEQ PO TBCR
20.0000 meq | EXTENDED_RELEASE_TABLET | Freq: Every day | ORAL | Status: DC
  Administered 2023-05-12 – 2023-05-17 (×5): 20 meq via ORAL
  Filled 2023-05-11 (×6): qty 1

## 2023-05-11 NOTE — Plan of Care (Signed)
  Problem: Education: Goal: Ability to describe self-care measures that may prevent or decrease complications (Diabetes Survival Skills Education) will improve Outcome: Progressing Goal: Individualized Educational Video(s) Outcome: Progressing   Problem: Coping: Goal: Ability to adjust to condition or change in health will improve Outcome: Progressing   Problem: Fluid Volume: Goal: Ability to maintain a balanced intake and output will improve Outcome: Progressing   Problem: Health Behavior/Discharge Planning: Goal: Ability to identify and utilize available resources and services will improve Outcome: Progressing Goal: Ability to manage health-related needs will improve Outcome: Progressing   Problem: Metabolic: Goal: Ability to maintain appropriate glucose levels will improve Outcome: Progressing   Problem: Nutritional: Goal: Maintenance of adequate nutrition will improve Outcome: Progressing Goal: Progress toward achieving an optimal weight will improve Outcome: Progressing   Problem: Skin Integrity: Goal: Risk for impaired skin integrity will decrease Outcome: Progressing   Problem: Tissue Perfusion: Goal: Adequacy of tissue perfusion will improve Outcome: Progressing   Problem: Education: Goal: Understanding of CV disease, CV risk reduction, and recovery process will improve Outcome: Progressing Goal: Individualized Educational Video(s) Outcome: Progressing   Problem: Activity: Goal: Ability to return to baseline activity level will improve Outcome: Progressing   Problem: Cardiovascular: Goal: Ability to achieve and maintain adequate cardiovascular perfusion will improve Outcome: Progressing Goal: Vascular access site(s) Level 0-1 will be maintained Outcome: Progressing   Problem: Health Behavior/Discharge Planning: Goal: Ability to safely manage health-related needs after discharge will improve Outcome: Progressing   Problem: Education: Goal: Knowledge  of General Education information will improve Description: Including pain rating scale, medication(s)/side effects and non-pharmacologic comfort measures Outcome: Progressing   Problem: Health Behavior/Discharge Planning: Goal: Ability to manage health-related needs will improve Outcome: Progressing   Problem: Clinical Measurements: Goal: Ability to maintain clinical measurements within normal limits will improve Outcome: Progressing Goal: Will remain free from infection Outcome: Progressing Goal: Diagnostic test results will improve Outcome: Progressing Goal: Respiratory complications will improve Outcome: Progressing Goal: Cardiovascular complication will be avoided Outcome: Progressing   Problem: Activity: Goal: Risk for activity intolerance will decrease Outcome: Progressing   Problem: Nutrition: Goal: Adequate nutrition will be maintained Outcome: Progressing   Problem: Coping: Goal: Level of anxiety will decrease Outcome: Progressing   Problem: Elimination: Goal: Will not experience complications related to bowel motility Outcome: Progressing Goal: Will not experience complications related to urinary retention Outcome: Progressing   Problem: Pain Managment: Goal: General experience of comfort will improve Outcome: Progressing   Problem: Safety: Goal: Ability to remain free from injury will improve Outcome: Progressing   Problem: Skin Integrity: Goal: Risk for impaired skin integrity will decrease Outcome: Progressing   

## 2023-05-11 NOTE — H&P (Signed)
[address]       [place] [address]             [phone]        Maryjean Quamme [facility] Medical Record #[id number] Date of Birth: 1961-01-18  History of Present Illness:      Stacy Frost is a 62 yo female with history of diabetes mellitus (type 2), migraine headache, and bicuspid aortic valve.  She was referred to Dr. Cliffton Asters for evaluation for severe aortic stenosis and was seen in our office today.  Upon evaluation the patient admitted to suffering syncopal episodes at home while using the bathroom.  Due to this, it was felt patient required urgent admission and surgical intervention to decrease risk of sudden cardiac death.  Currently, the patient is sitting on the edge of the bed, awaiting nurse to obtain IV. On the monitor, SR with HR in the 80's. She denies chest pain or shortness of breath. Past Medical History:  Diagnosis Date   Anxiety    Aortic stenosis    Mild to moderate   Bicuspid aortic valve    Essential hypertension, benign    GERD (gastroesophageal reflux disease)    History of cardiac catheterization    Normal coronaries 2008   Hyperlipidemia    Migraine headache    MVP (mitral valve prolapse)    Obesity    Pain in joint, ankle and foot    Chronic   Palpitations    Documented PACs by previous monitoring   Restless leg syndrome    Skin tag    Type 2 diabetes mellitus (HCC)    Unilateral primary osteoarthritis, right knee 09/06/2016    Past Surgical History:  Procedure Laterality Date   ACHILLES TENDON REPAIR  2003   Left   CARDIAC CATHETERIZATION  2008   Normal coronary arteries; normal LV systolic function; mild dilation of aortic root; mild dilation of proximal ascending aorta; likely bicuspid aortic valve   CHOLECYSTECTOMY  2001   "Poor EF at 17%"   COLONOSCOPY N/A 07/17/2014   Procedure: COLONOSCOPY;  Surgeon: Malissa Hippo, MD;  Location: [facility];  Service: Endoscopy;  Laterality: N/A;  105   ESOPHAGEAL DILATION N/A  06/11/2015   Procedure: ESOPHAGEAL DILATION;  Surgeon: Malissa Hippo, MD;  Location: [facility] ORS;  Service: Endoscopy;  Laterality: N/AElease Hashimoto 54/56/58   ESOPHAGOGASTRODUODENOSCOPY N/A 07/17/2014   Procedure: ESOPHAGOGASTRODUODENOSCOPY (EGD);  Surgeon: Malissa Hippo, MD;  Location: [facility];  Service: Endoscopy;  Laterality: N/A;   ESOPHAGOGASTRODUODENOSCOPY (EGD) WITH PROPOFOL N/A 06/11/2015   Procedure: ESOPHAGOGASTRODUODENOSCOPY (EGD) WITH PROPOFOL;  Surgeon: Malissa Hippo, MD;  Location: [facility] OR[facility];  Service: Endoscopy;  Laterality: N/A;  730   HERNIA REPAIR     umbilical    KNEE ARTHROSCOPY WITH MEDIAL MENISECTOMY Right 07/02/2018   Procedure: RIGHT KNEE ARTHROSCOPY, DEBRIDEMENT, MEDIAL MENISECTOMY;  Surgeon: [name], MD;  Location: [facility] OR;  Service: Orthopedics;  Laterality: Right;   KNEE SURGERY     MALONEY DILATION N/A 07/17/2014   Procedure: [name] DILATION;  Surgeon: Malissa Hippo, MD;  Location: [facility];  Service: Endoscopy;  Laterality: N/A;   RIGHT/LEFT HEART CATH AND CORONARY ANGIOGRAPHY N/A 05/02/2023   Procedure: RIGHT/LEFT HEART CATH AND CORONARY ANGIOGRAPHY;  Surgeon: [place], Peter M, MD;  Location: [facility] INVASIVE CV LAB;  Service: Cardiovascular;  Laterality: N/A;   UMBILICAL HERNIA REPAIR  2003    Social History   Tobacco  Use  Smoking Status Never  Smokeless Tobacco Never    Social History   Substance and Sexual Activity  Alcohol Use No   Alcohol/week: 0.0 standard drinks of alcohol     Allergies  Allergen Reactions   Amlodipine Besylate Shortness Of Breath   Morphine And Codeine Shortness Of Breath and Other (See Comments)    Chest pain   Amitriptyline Other (See Comments)    Unknown reaction   Butorphanol Tartrate Other (See Comments)    REACTION: ED visit and got for migraine - not sure of response   Compazine [Prochlorperazine Edisylate] Other (See Comments)    Altered mental status   Rocephin [Ceftriaxone Sodium In Dextrose] Other (See  Comments)    Unknown   Sumatriptan Other (See Comments)    REACTION: HTN   Current Outpatient Medications  Medication Sig Dispense Refill   acetaminophen-codeine (TYLENOL #3) 300-30 MG tablet Take 1 tablet by mouth 2 (two) times daily as needed.     aspirin EC 81 MG tablet Take 1 tablet (81 mg total) by mouth daily.     celecoxib (CELEBREX) 200 MG capsule Take 200 mg by mouth 2 (two) times daily.     citalopram (CELEXA) 40 MG tablet Take 40 mg by mouth daily.     clonazePAM (KLONOPIN) 0.5 MG tablet Take 0.5 mg by mouth 2 (two) times daily as needed.     Continuous Blood Gluc Receiver (DEXCOM G7 RECEIVER) DEVI USE 1 receiver continuously     Continuous Blood Gluc Sensor (DEXCOM G7 SENSOR) MISC APPLY 1 SENSOR EVERY 10 DAYS     cyclobenzaprine (FLEXERIL) 10 MG tablet Take 10 mg by mouth 3 (three) times daily.     furosemide (LASIX) 20 MG tablet Take 40 mg by mouth daily.      lisinopril (PRINIVIL,ZESTRIL) 20 MG tablet Take 20 mg by mouth daily.      metFORMIN (GLUCOPHAGE) 1000 MG tablet Take 1 tablet (1,000 mg total) by mouth 2 (two) times daily. (Patient taking differently: Take 1,000 mg by mouth daily with breakfast.)     metoprolol (TOPROL-XL) 200 MG 24 hr tablet Take 200 mg by mouth daily.       MOUNJARO 7.5 MG/0.5ML Pen INJECT 7.5MG  UNDER THE SKIN ONCE A WEEK 6 mL 0   nitroGLYCERIN (NITROSTAT) 0.4 MG SL tablet Place 1 tablet (0.4 mg total) under the tongue every 5 (five) minutes as needed for chest pain. 25 tablet 3   potassium chloride SA (K-DUR,KLOR-CON) 20 MEQ tablet Take 20 mEq by mouth daily.      pramipexole (MIRAPEX) 0.5 MG tablet Take 0.5 mg by mouth at bedtime.     promethazine (PHENERGAN) 25 MG tablet Take 25 mg by mouth 2 (two) times daily as needed.     simvastatin (ZOCOR) 40 MG tablet Take 40 mg by mouth daily.     traZODone (DESYREL) 100 MG tablet Take 100 mg by mouth at bedtime.        Family History  Problem Relation Age of Onset   Depression Mother    Cancer Mother         Lung mets (unsure as to what kind)   Diabetes Mother    Hypertension Father    Depression Father    Heart attack Father    Depression Daughter    Anxiety disorder Daughter    Stroke Daughter    Transient ischemic attack Brother    Emphysema Maternal Grandfather    Brain cancer Paternal Grandmother  Heart attack Paternal Grandfather     Review of Systems:    Cardiac Review of Systems: Y or  [  N  ]= no  Chest tightness [  Y;usually occurs with shortness of breath with exertion  ]  Syncope  [ N ]     General Review of Systems: [Y] = yes [ N ]=no Constitional:  fatigue [ Y ]; nausea [ N ]; fever [  N];                                                          Eye : things appear whited out at times-occurs without migraine headache at times. Resp: cough [  N];  wheezing[ N ];  hemoptysis[ N ];  GI:  vomiting[ N ]; melena[N  ];  hematochezia [  N];  GU: hematuria[ N ];                Skin: rash, swelling[N  ];,   Heme/Lymph:anemia[ N ];  Neuro: TIA[ N ];  headaches[ Y-migraine ];  stroke[ N ];    Endocrine: diabetes[Y  ];                             Physical Exam: General appearance: alert, cooperative, and no distress Head: Normocephalic, without obvious abnormality, atraumatic Neck: no carotid bruit and supple, symmetrical, trachea midline Resp: clear to auscultation bilaterally Cardio: RRR, Grade III/VI systolic murmur heard best along left sternal border with radiation to the carotid arteries GI: Soft, non tender, bowel sounds present Extremities: No LE edema Neurologic: Grossly normal  Diagnostic Studies & Laboratory data:  Cardiac Catheterization done by 05/02/2023:   Normal coronary anatomy Normal LV filling pressure. PCWP 19/22 mean 13 mm Hg Normal right heart pressures. PAP 31/13 with mean 22 mm Hg Normal cardiac output 5.54 L/min, index 2.90  Echo done 05/01/2023:  IMPRESSIONS     1. Left ventricular ejection fraction, by estimation, is 50 to 55%.  The  left ventricle has low normal function. The left ventricle has no regional  wall motion abnormalities. There is moderate left ventricular hypertrophy.  Left ventricular diastolic parameters are indeterminate. Elevated left atrial pressure.   2. Right ventricular systolic function is normal. The right ventricular  size is normal.   3. Left atrial size was mildly dilated.   4. The mitral valve is abnormal. Mild mitral valve regurgitation. No  evidence of mitral stenosis.   5. Severe aortic stenosis, highest mean gradient measured with Pedhoff  probe at 53 mmHg. AVA VTI 0.7, DI 0.18. Marland Kitchen The aortic valve has an  indeterminant number of cusps. There is severe calcifcation of the aortic  valve. There is severe thickening of the aortic valve. Aortic valve regurgitation is mild. Severe aortic valve stenosis.   6. There is mild dilatation of the ascending aorta, measuring 41 mm.   7. The inferior vena cava is normal in size with greater than 50%  respiratory variability, suggesting right atrial pressure of 3 mmHg.   FINDINGS   Left Ventricle: Left ventricular ejection fraction, by estimation, is 50  to 55%. The left ventricle has low normal function. The left ventricle has  no regional wall motion abnormalities. The left ventricular internal  cavity size was normal in  size.  There is moderate left ventricular hypertrophy. Left ventricular diastolic  parameters are indeterminate. Elevated left atrial pressure.   Right Ventricle: The right ventricular size is normal. Right vetricular  wall thickness was not well visualized. Right ventricular systolic  function is normal.   Left Atrium: Left atrial size was mildly dilated.   Right Atrium: Right atrial size was normal in size.   Pericardium: There is no evidence of pericardial effusion.   Mitral Valve: The mitral valve is abnormal. Mild mitral valve  regurgitation. No evidence of mitral valve stenosis.   Tricuspid Valve: The tricuspid  valve is normal in structure. Tricuspid  valve regurgitation is not demonstrated. No evidence of tricuspid  stenosis.   Aortic Valve: Severe aortic stenosis, highest mean gradient measured with  Pedhoff probe at 53 mmHg. AVA VTI 0.7, DI 0.18. The aortic valve has an  indeterminant number of cusps. There is severe calcifcation of the aortic  valve. There is severe thickening of the aortic valve. There is severe aortic valve annular calcification.  Aortic valve regurgitation is mild. Aortic regurgitation PHT measures 329  msec. Severe aortic stenosis is present. Aortic valve mean gradient  measures 53.0 mmHg. Aortic valve peak  gradient measures 76.7 mmHg. Aortic valve area, by VTI measures 0.70 cm.   Pulmonic Valve: The pulmonic valve was not well visualized. Pulmonic valve  regurgitation is not visualized. No evidence of pulmonic stenosis.   Aorta: The aortic root is normal in size and structure. There is mild  dilatation of the ascending aorta, measuring 41 mm.   Venous: The inferior vena cava is normal in size with greater than 50%  respiratory variability, suggesting right atrial pressure of 3 mmHg.   IAS/Shunts: The interatrial septum was not well visualized.    Recent Radiology Findings:   No results found.   I have independently reviewed the above radiologic studies and discussed with the patient   Recent Lab Findings: Lab Results  Component Value Date   WBC 6.6 05/01/2023   HGB 10.9 (L) 05/02/2023   HCT 32.0 (L) 05/02/2023   PLT 218 05/01/2023   GLUCOSE 156 (H) 05/02/2023   CHOL 165 06/29/2008   TRIG 206 06/29/2008   HDL 42 06/29/2008   LDLCALC 82 06/29/2008   ALT 39 (H) 06/16/2015   AST 30 06/16/2015   NA 137 05/02/2023   K 4.9 05/02/2023   CL 100 05/02/2023   CREATININE 1.47 (H) 05/02/2023   BUN 27 (H) 05/02/2023   CO2 24 05/02/2023   HGBA1C 6.2 (A) 11/29/2022   Assessment / Plan:      Severe Aortic Stenosis, known bicuspid valve- patient experiences  syncope at home, urgent admission.We will obtain CTA of chest to assess aorta to ensure no aneurysm is present DM- well controlled- hold Monjuaro/Metformin prior to surgery, will cover with SSIP and can add Semglee if needed History of hyperlipidemia- continue Zocor History of hypertension- continue Toprol XL, hold Lisinopril for now, will add prn Hydralazine Chronic Joint pain- medications per home regimen 6.  Admit to inpatient, complete preoperative workup. Plan for AVR +/- intervention on aorta if indicated by CT on  Monday with Dr. Cliffton Asters  I  spent 20 minutes counseling the patient face to face.   Doree Fudge PA-C 05/11/2023 12:23 PM

## 2023-05-12 ENCOUNTER — Inpatient Hospital Stay (HOSPITAL_COMMUNITY): Payer: 59

## 2023-05-12 DIAGNOSIS — R55 Syncope and collapse: Secondary | ICD-10-CM | POA: Diagnosis not present

## 2023-05-12 LAB — CBC
HCT: 33.4 % — ABNORMAL LOW (ref 36.0–46.0)
Hemoglobin: 10.9 g/dL — ABNORMAL LOW (ref 12.0–15.0)
MCH: 28 pg (ref 26.0–34.0)
MCHC: 32.6 g/dL (ref 30.0–36.0)
MCV: 85.9 fL (ref 80.0–100.0)
Platelets: 191 10*3/uL (ref 150–400)
RBC: 3.89 MIL/uL (ref 3.87–5.11)
RDW: 13.3 % (ref 11.5–15.5)
WBC: 6.2 10*3/uL (ref 4.0–10.5)
nRBC: 0 % (ref 0.0–0.2)

## 2023-05-12 LAB — BASIC METABOLIC PANEL
Anion gap: 7 (ref 5–15)
BUN: 20 mg/dL (ref 8–23)
CO2: 27 mmol/L (ref 22–32)
Calcium: 9.2 mg/dL (ref 8.9–10.3)
Chloride: 101 mmol/L (ref 98–111)
Creatinine, Ser: 0.98 mg/dL (ref 0.44–1.00)
GFR, Estimated: >60 mL/min (ref >60)
Glucose, Bld: 167 mg/dL — ABNORMAL HIGH (ref 70–99)
Potassium: 4.8 mmol/L (ref 3.5–5.1)
Sodium: 135 mmol/L (ref 135–145)

## 2023-05-12 LAB — GLUCOSE, CAPILLARY
Glucose-Capillary: 249 mg/dL — ABNORMAL HIGH (ref 70–99)
Glucose-Capillary: 146 mg/dL — ABNORMAL HIGH (ref 70–99)
Glucose-Capillary: 155 mg/dL — ABNORMAL HIGH (ref 70–99)
Glucose-Capillary: 166 mg/dL — ABNORMAL HIGH (ref 70–99)

## 2023-05-12 LAB — SURGICAL PCR SCREEN
MRSA, PCR: NEGATIVE
Staphylococcus aureus: NEGATIVE

## 2023-05-12 MED ORDER — OXYCODONE-ACETAMINOPHEN 7.5-325 MG PO TABS
1.0000 | ORAL_TABLET | Freq: Four times a day (QID) | ORAL | Status: DC | PRN
  Administered 2023-05-12 – 2023-05-13 (×5): 1 via ORAL
  Filled 2023-05-12 (×5): qty 1

## 2023-05-12 MED ORDER — INSULIN DETEMIR 100 UNIT/ML ~~LOC~~ SOLN
6.0000 [IU] | Freq: Two times a day (BID) | SUBCUTANEOUS | Status: DC
  Administered 2023-05-12: 6 [IU] via SUBCUTANEOUS
  Filled 2023-05-12 (×3): qty 0.06

## 2023-05-12 NOTE — Progress Notes (Signed)
CARDIAC REHAB PHASE I   PRE:  Rate/Rhythm: 77 SR    Orthostatics:  BP:  Supine: 102/48   Sitting: 104/68   Standing: 105/74     Pt has been having bouts of severe dizziness upon standing. Orthostatics done today with nurse. First attempt she was unable to stand and returned to sitting because of sever dizziness. Second attempt success, but still symptomatic. Pt states she does not feel leg weakness, but does endorse dizziness and vision changes that cause lack of balance. May want to repeat post Op orthostatics. No ambulation today.   Reviewed Valve replacement pre - op education. Pt and family received information well and gave resources on move in the tube for review. Pt left in the bed with call bell in reach. Pt will have family to stay upon discharge for 1 week, and seems interested in CRP2 for aftercare. Pt verbalize understanding and all questions were answered.  [id number] Harrie Jeans ACSM-CEP 05/12/2023 10:03 AM

## 2023-05-12 NOTE — Progress Notes (Signed)
VASCULAR LAB    Carotid duplex has been performed.  See CV proc for preliminary results.   Lizzeth Meder, RVT 05/12/2023, 11:06 AM

## 2023-05-12 NOTE — Progress Notes (Signed)
      [address]       [place] [address]             [phone]          Procedure(s) (LRB): AORTIC VALVE REPLACEMENT (AVR) (N/A) TRANSESOPHAGEAL ECHOCARDIOGRAM (N/A)  Subjective: Patient felt a little light headed earlier this am with standing. She denies chest pain.   Objective: Vital signs in last 24 hours: Temp:  [97.4 F (36.3 C)-98.3 F (36.8 C)] 97.7 F (36.5 C) (07/13 0825) Pulse Rate:  [75-89] 78 (07/13 0825) Cardiac Rhythm: Normal sinus rhythm (07/13 0812) Resp:  [18-20] 18 (07/13 0825) BP: (87-113)/(51-69) 99/60 (07/13 0825) SpO2:  [94 %-98 %] 94 % (07/13 0410) Weight:  [80.3 kg] 80.3 kg (07/12 1423)   Current Weight  05/11/23 80.3 kg      Intake/Output from previous day: No intake/output data recorded.   Physical Exam:  Cardiovascular: RRR,grade III/VI systolic murmur heard best along left sternal border with radiation to the carotid arteries Pulmonary: Clear to auscultation bilaterally Extremities: No lower extremity edema.   Lab Results: CBC: Recent Labs    05/12/23 0152  WBC 6.2  HGB 10.9*  HCT 33.4*  PLT 191   BMET:  Recent Labs    05/12/23 0152  NA 135  K 4.8  CL 101  CO2 27  GLUCOSE 167*  BUN 20  CREATININE 0.98  CALCIUM 9.2    PT/INR: No results found for: "INR", "PROTIME" ABG:  INR: Will add last result for INR, ABG once components are confirmed Will add last 4 CBG results once components are confirmed  Assessment/Plan:  1. CV - SR. On Toprol XL 100 mg daily. 2.  Pulmonary - On room air. 3. DM-CBGs 190/168/249. She was on Metformin 1000 mg bid and Mounjaro. Will start scheduled Insulin as to OR on Monday.  4. Anemia-H and H this am stable at 10.9 and 33.4. 5.  CTA done yesterday showed no evidence of thoracic aortic aneurysm. She has a bicuspid valve. To OR Monday for AVR. 6. Left pulmonary nodule 6 mm also seen on CTA and will continue surveillance as outpatien  Shaneice Barsanti M  ZimmermanPA-C 8:37 AM

## 2023-05-12 NOTE — Progress Notes (Signed)
     [address]       [place] [address]             [phone]       No events Vitals:   05/12/23 0410 05/12/23 0825  BP: (!) 93/54 99/60  Pulse: 75 78  Resp: 18 18  Temp: (!) 97.5 F (36.4 C) 97.7 F (36.5 C)  SpO2: 94%    Alert NAD Sinus  EWOB  OR on Monday for AVR  Stacy Frost Stacy Frost

## 2023-05-13 LAB — HEMOGLOBIN A1C
Hgb A1c MFr Bld: 7.4 % — ABNORMAL HIGH (ref 4.8–5.6)
Mean Plasma Glucose: 165.68 mg/dL

## 2023-05-13 LAB — GLUCOSE, CAPILLARY
Glucose-Capillary: 244 mg/dL — ABNORMAL HIGH (ref 70–99)
Glucose-Capillary: 150 mg/dL — ABNORMAL HIGH (ref 70–99)
Glucose-Capillary: 130 mg/dL — ABNORMAL HIGH (ref 70–99)
Glucose-Capillary: 167 mg/dL — ABNORMAL HIGH (ref 70–99)

## 2023-05-13 LAB — PREPARE RBC (CROSSMATCH)

## 2023-05-13 LAB — ABO/RH: ABO/RH(D): A NEG

## 2023-05-13 MED ORDER — CHLORHEXIDINE GLUCONATE 0.12 % MT SOLN
15.0000 mL | Freq: Once | OROMUCOSAL | Status: AC
  Administered 2023-05-14: 15 mL via OROMUCOSAL
  Filled 2023-05-13: qty 15

## 2023-05-13 MED ORDER — CHLORHEXIDINE GLUCONATE CLOTH 2 % EX PADS: 6.0000 | MEDICATED_PAD | Freq: Once | CUTANEOUS | Status: DC

## 2023-05-13 MED ORDER — INSULIN DETEMIR 100 UNIT/ML ~~LOC~~ SOLN
8.0000 [IU] | Freq: Two times a day (BID) | SUBCUTANEOUS | Status: DC
  Administered 2023-05-13 – 2023-05-21 (×15): 8 [IU] via SUBCUTANEOUS
  Filled 2023-05-13 (×21): qty 0.08

## 2023-05-13 MED ORDER — TEMAZEPAM 7.5 MG PO CAPS
15.0000 mg | ORAL_CAPSULE | Freq: Once | ORAL | Status: AC | PRN
  Administered 2023-05-13: 15 mg via ORAL
  Filled 2023-05-13: qty 2

## 2023-05-13 MED ORDER — METOPROLOL TARTRATE 12.5 MG HALF TABLET
12.5000 mg | ORAL_TABLET | Freq: Once | ORAL | Status: AC
  Administered 2023-05-14: 12.5 mg via ORAL
  Filled 2023-05-13: qty 1

## 2023-05-13 MED ORDER — METOPROLOL SUCCINATE ER 25 MG PO TB24
25.0000 mg | ORAL_TABLET | Freq: Every day | ORAL | Status: DC
  Administered 2023-05-13: 25 mg via ORAL
  Filled 2023-05-13: qty 1

## 2023-05-13 MED ORDER — BISACODYL 5 MG PO TBEC
5.0000 mg | DELAYED_RELEASE_TABLET | Freq: Once | ORAL | Status: AC
  Administered 2023-05-13: 5 mg via ORAL
  Filled 2023-05-13: qty 1

## 2023-05-13 MED ORDER — CHLORHEXIDINE GLUCONATE CLOTH 2 % EX PADS
6.0000 | MEDICATED_PAD | Freq: Once | CUTANEOUS | Status: AC
  Administered 2023-05-14: 6 via TOPICAL

## 2023-05-13 MED ORDER — CHLORHEXIDINE GLUCONATE CLOTH 2 % EX PADS
6.0000 | MEDICATED_PAD | Freq: Once | CUTANEOUS | Status: AC
  Administered 2023-05-13: 6 via TOPICAL

## 2023-05-13 NOTE — Progress Notes (Signed)
      [address]       [name] [address]             [phone]          Procedure(s) (LRB): AORTIC VALVE REPLACEMENT (AVR) (N/A) TRANSESOPHAGEAL ECHOCARDIOGRAM (N/A)  Subjective: Patient just finished breakfast. Her son is with her this am. She has no specific complaint this am.  Objective: Vital signs in last 24 hours: Temp:  [97.7 F (36.5 C)-98.5 F (36.9 C)] 98.5 F (36.9 C) (07/14 0322) Pulse Rate:  [76-82] 82 (07/14 0322) Cardiac Rhythm: Normal sinus rhythm (07/13 1907) Resp:  [18] 18 (07/14 0322) BP: (90-99)/(50-60) 95/50 (07/14 0322) SpO2:  [95 %-97 %] 95 % (07/14 0322)   Current Weight  05/11/23 80.3 kg      Intake/Output from previous day: No intake/output data recorded.   Physical Exam:  Cardiovascular: RRR,grade III/VI systolic murmur heard best along left sternal border with radiation to the carotid arteries Pulmonary: Clear to auscultation bilaterally Extremities: No lower extremity edema.   Lab Results: CBC: Recent Labs    05/12/23 0152  WBC 6.2  HGB 10.9*  HCT 33.4*  PLT 191   BMET:  Recent Labs    05/12/23 0152  NA 135  K 4.8  CL 101  CO2 27  GLUCOSE 167*  BUN 20  CREATININE 0.98  CALCIUM 9.2    PT/INR: No results found for: "INR", "PROTIME" ABG:  INR: Will add last result for INR, ABG once components are confirmed Will add last 4 CBG results once components are confirmed  Assessment/Plan:  1. CV - SR. On Toprol XL 100 mg daily. She did not receive yesterday secondary to SBP in the 90's. Will decrease dose of Toprol XL for now and there are already parameters in place. 2.  Pulmonary - On room air. 3. DM-CBGs 146/155/166. She was on Metformin 1000 mg bid and Mounjaro.Continue scheduled low dose Insulin as to OR on Monday.  4. Anemia-Last H and H stable at 10.9 and 33.4. 5. OR in am for AVR  Tyrann Donaho M ZimmermanPA-C 7:49 AM

## 2023-05-13 NOTE — Progress Notes (Signed)
[address]       [place] [address]             [phone]        Stacy Frost [facility] Medical Record #[id number] Date of Birth: 1961-04-02  Referring: [place], Stacy M, MD Primary Care: Stacy Peri, MD Primary Cardiologist:None  Chief Complaint:   No chief complaint on file.   History of Present Illness:     Stacy Frost 62 y.Frost. female presents for surgical evaluation of severe aortic stenosis.  She has a known hx of a bicuspid aortic valve, and has had progressive exertional dyspnea, chest pain, and presyncopal episodes.  Recently, she has also had multiple falls at home due to balance issues, and regularly had pre-syncopy while on the commode.     Past Medical and Surgical History: Previous Chest Surgery: no Previous Chest Radiation: no Diabetes Mellitus: yes.  HbA1C 6.2 Creatinine:  Lab Results  Component Value Date   CREATININE 0.98 05/12/2023   CREATININE 1.47 (H) 05/02/2023   CREATININE 1.42 (H) 05/01/2023     Past Medical History:  Diagnosis Date   Anxiety    Aortic stenosis    Mild to moderate   Bicuspid aortic valve    Essential hypertension, benign    GERD (gastroesophageal reflux disease)    History of cardiac catheterization    Normal coronaries 2008   Hyperlipidemia    Migraine headache    MVP (mitral valve prolapse)    Obesity    Pain in joint, ankle and foot    Chronic   Palpitations    Documented PACs by previous monitoring   Restless leg syndrome    Skin tag    Type 2 diabetes mellitus (HCC)    Unilateral primary osteoarthritis, right knee 09/06/2016    Past Surgical History:  Procedure Laterality Date   ACHILLES TENDON REPAIR  2003   Left   CARDIAC CATHETERIZATION  2008   Normal coronary arteries; normal LV systolic function; mild dilation of aortic root; mild dilation of proximal ascending aorta; likely bicuspid aortic valve   CHOLECYSTECTOMY  2001   "Poor EF at 17%"   COLONOSCOPY N/A 07/17/2014   Procedure:  COLONOSCOPY;  Surgeon: Stacy Hippo, MD;  Location: [facility];  Service: Endoscopy;  Laterality: N/A;  105   ESOPHAGEAL DILATION N/A 06/11/2015   Procedure: ESOPHAGEAL DILATION;  Surgeon: Stacy Hippo, MD;  Location: [facility] ORS;  Service: Endoscopy;  Laterality: N/AElease Frost 54/56/58   ESOPHAGOGASTRODUODENOSCOPY N/A 07/17/2014   Procedure: ESOPHAGOGASTRODUODENOSCOPY (EGD);  Surgeon: Stacy Hippo, MD;  Location: [facility];  Service: Endoscopy;  Laterality: N/A;   ESOPHAGOGASTRODUODENOSCOPY (EGD) WITH PROPOFOL N/A 06/11/2015   Procedure: ESOPHAGOGASTRODUODENOSCOPY (EGD) WITH PROPOFOL;  Surgeon: Stacy Hippo, MD;  Location: [facility] ORS;  Service: Endoscopy;  Laterality: N/A;  730   HERNIA REPAIR     umbilical    KNEE ARTHROSCOPY WITH MEDIAL MENISECTOMY Right 07/02/2018   Procedure: RIGHT KNEE ARTHROSCOPY, DEBRIDEMENT, MEDIAL MENISECTOMY;  Surgeon: Stacy Batman, MD;  Location: [facility] OR;  Service: Orthopedics;  Laterality: Right;   KNEE SURGERY     MALONEY DILATION N/A 07/17/2014   Procedure: Stacy Frost DILATION;  Surgeon: Stacy Hippo, MD;  Location: [facility];  Service: Endoscopy;  Laterality: N/A;   RIGHT/LEFT HEART CATH AND CORONARY ANGIOGRAPHY N/A 05/02/2023   Procedure: RIGHT/LEFT HEART CATH AND CORONARY ANGIOGRAPHY;  Surgeon: [place], Stacy M, MD;  Location: [facility] INVASIVE CV LAB;  Service: Cardiovascular;  Laterality: N/A;   UMBILICAL HERNIA REPAIR  2003    Social History:  Social History   Tobacco Use  Smoking Status Never  Smokeless Tobacco Never    Social History   Substance and Sexual Activity  Alcohol Use No   Alcohol/week: 0.0 standard drinks of alcohol     Allergies  Allergen Reactions   Amlodipine Besylate Shortness Of Breath   Morphine And Codeine Shortness Of Breath and Other (See Comments)    Chest pain   Amitriptyline Other (See Comments)    Unknown reaction   Butorphanol Tartrate Other (See Comments)    REACTION: ED visit and got for migraine - not  sure of response   Compazine [Prochlorperazine Edisylate] Other (See Comments)    Altered mental status   Rocephin [Ceftriaxone Sodium In Dextrose]     Given at GYN for infection - nauseated, dizziness (w/in last 7-8 years)   Sumatriptan Other (See Comments)    REACTION: HTN      No current facility-administered medications for this visit.   No current outpatient medications on file.   Facility-Administered Medications Ordered in Other Visits  Medication Dose Route Frequency Provider Last Rate Last Admin   acetaminophen-codeine (TYLENOL #3) 300-30 MG per tablet 1 tablet  1 tablet Oral Q4H PRN Barrett, Stacy R, PA-C   1 tablet at 05/12/23 2123   aspirin chewable tablet 81 mg  81 mg Oral Daily Barrett, Stacy R, PA-C   81 mg at 05/13/23 0813   bisacodyl (DULCOLAX) EC tablet 5 mg  5 mg Oral Once Stacy Skains, MD       [START ON 05/14/2023] chlorhexidine (PERIDEX) 0.12 % solution 15 mL  15 mL Mouth/Throat Once Stacy Winther, Stacy Lofts, MD       Chlorhexidine Gluconate Cloth 2 % PADS 6 each  6 each Topical Once Stacy Lovelady, Stacy Lofts, MD       And   Chlorhexidine Gluconate Cloth 2 % PADS 6 each  6 each Topical Once Stacy Templer, Stacy Lofts, MD       citalopram (CELEXA) tablet 40 mg  40 mg Oral Daily Barrett, Stacy R, PA-C   40 mg at 05/13/23 0813   clonazePAM (KLONOPIN) tablet 0.5 mg  0.5 mg Oral BID PRN Barrett, Stacy R, PA-C       cyclobenzaprine (FLEXERIL) tablet 10 mg  10 mg Oral TID Barrett, Stacy R, PA-C   10 mg at 05/13/23 0814   [START ON 05/14/2023] dexmedetomidine (PRECEDEX) 400 MCG/100ML (4 mcg/mL) infusion  0.1-0.7 mcg/kg/hr Intravenous To OR Stacy Thomley, Stacy Lofts, MD       [START ON 05/14/2023] EPINEPHrine (ADRENALIN) 5 mg in NS 250 mL (0.02 mg/mL) premix infusion  0-10 mcg/min Intravenous To OR Stacy Kenneth O, MD       furosemide (LASIX) tablet 40 mg  40 mg Oral Daily Barrett, Stacy R, PA-C   40 mg at 05/13/23 0814   [START ON 05/14/2023] heparin 30,000 units/NS 1000 mL solution for  CELLSAVER   Other To OR Stacy Skains, MD       [START ON 05/14/2023] heparin sodium (porcine) 2,500 Units, papaverine 30 mg in electrolyte-A (PLASMALYTE-A PH 7.4) 500 mL irrigation   Irrigation To OR Meggin Ola O, MD       insulin aspart (novoLOG) injection 0-5 Units  0-5 Units Subcutaneous QHS Barrett, Stacy R, PA-C       insulin aspart (novoLOG) injection 0-9 Units  0-9 Units Subcutaneous TID WC Barrett, Stacy R, PA-C  1 Units at 05/13/23 1236   insulin detemir (LEVEMIR) injection 8 Units  8 Units Subcutaneous BID Ardelle Balls, PA-C   8 Units at 05/13/23 0901   [START ON 05/14/2023] insulin regular, human (MYXREDLIN) 100 units/ 100 mL infusion   Intravenous To OR Stacy Skains, MD       [START ON 05/14/2023] [facility] Blood Cardioplegia vial (lidocaine/magnesium/mannitol 0.26g-4g-6.4g)   Intracoronary To OR Stacy Skains, MD       [START ON 05/14/2023] levofloxacin (LEVAQUIN) IVPB 500 mg  500 mg Intravenous To OR Messi Twedt, Stacy Lofts, MD       metoprolol succinate (TOPROL-XL) 24 hr tablet 25 mg  25 mg Oral Daily Doree Fudge M, PA-C   25 mg at 05/13/23 0813   [START ON 05/14/2023] metoprolol tartrate (LOPRESSOR) tablet 12.5 mg  12.5 mg Oral Once Stacy Skains, MD       [START ON 05/14/2023] milrinone (PRIMACOR) 20 MG/100 ML (0.2 mg/mL) infusion  0.3 mcg/kg/min Intravenous To OR Lorretta Kerce, Stacy Lofts, MD       nitroGLYCERIN (NITROSTAT) SL tablet 0.4 mg  0.4 mg Sublingual Q5 min PRN Barrett, Stacy R, PA-C       [START ON 05/14/2023] nitroGLYCERIN 50 mg in dextrose 5 % 250 mL (0.2 mg/mL) infusion  2-200 mcg/min Intravenous To OR Rollan Roger, Stacy Lofts, MD       [START ON 05/14/2023] norepinephrine (LEVOPHED) 4mg  in (0.016 mg/mL) premix infusion  0-40 mcg/min Intravenous To OR Kymani Laursen, Stacy Lofts, MD       oxyCODONE-acetaminophen (PERCOCET) 7.5-325 MG per tablet 1 tablet  1 tablet Oral Q6H PRN Ardelle Balls, PA-C   1 tablet at 05/13/23 0813    [START ON 05/14/2023] phenylephrine (NEO-SYNEPHRINE) 20mg /NS premix infusion  30-200 mcg/min Intravenous To OR Stacy Skains, MD       [START ON 05/14/2023] potassium chloride injection 80 mEq  80 mEq Other To OR Jasen Hartstein, Stacy Lofts, MD       potassium chloride SA (KLOR-CON Frost) CR tablet 20 mEq  20 mEq Oral Daily Barrett, Stacy R, PA-C   20 mEq at 05/13/23 0813   pramipexole (MIRAPEX) tablet 0.5 mg  0.5 mg Oral QHS Barrett, Stacy R, PA-C   0.5 mg at 05/12/23 2123   promethazine (PHENERGAN) tablet 25 mg  25 mg Oral Q6H PRN Barrett, Stacy R, PA-C       simvastatin (ZOCOR) tablet 40 mg  40 mg Oral q1800 Barrett, Stacy R, PA-C   40 mg at 05/12/23 1708   temazepam (RESTORIL) capsule 15 mg  15 mg Oral Once PRN Stacy Skains, MD       [START ON 05/14/2023] tranexamic acid (CYKLOKAPRON) 2,500 mg in sodium chloride 0.9 % 250 mL (10 mg/mL) infusion  1.5 mg/kg/hr Intravenous To OR Stacy Skains, MD       [START ON 05/14/2023] tranexamic acid (CYKLOKAPRON) bolus via infusion - over 30 minutes 1,204.5 mg  15 mg/kg Intravenous To OR Stacy Skains, MD       [START ON 05/14/2023] tranexamic acid (CYKLOKAPRON) pump prime solution 161 mg  2 mg/kg Intracatheter To OR Dean Wonder, Stacy Lofts, MD       traZODone (DESYREL) tablet 100 mg  100 mg Oral QHS Barrett, Stacy R, PA-C   100 mg at 05/12/23 2123   [START ON 05/14/2023] vancomycin (VANCOREADY) IVPB 1250 mg/250 mL  1,250 mg Intravenous To OR Durell Lofaso, Stacy Lofts, MD        (Not in  a hospital admission)   Family History  Problem Relation Age of Onset   Depression Mother    Cancer Mother        Lung mets (unsure as to what kind)   Diabetes Mother    Hypertension Father    Depression Father    Heart attack Father    Depression Daughter    Anxiety disorder Daughter    Stroke Daughter    Transient ischemic attack Brother    Emphysema Maternal Grandfather    Brain cancer Paternal Grandmother    Heart attack Paternal Grandfather       Review of Systems:   Review of Systems  Constitutional:  Positive for malaise/fatigue. Negative for weight loss.  Respiratory:  Positive for shortness of breath.   Cardiovascular:  Positive for chest pain.  Neurological:  Positive for dizziness and weakness.      Physical Exam: BP 101/68 (BP Location: Left Arm, Patient Position: Sitting)   Pulse 89   Resp 20   Ht 5\' 6"  (1.676 Frost)   Wt 177 lb (80.3 kg)   SpO2 94% Comment: RA  BMI 28.57 kg/Frost  Physical Exam Constitutional:      General: She is not in acute distress.    Appearance: She is not ill-appearing.  HENT:     Head: Normocephalic and atraumatic.  Cardiovascular:     Rate and Rhythm: Normal rate.  Pulmonary:     Effort: Pulmonary effort is normal. No respiratory distress.  Abdominal:     General: Abdomen is flat. There is no distension.  Musculoskeletal:        General: Normal range of motion.  Skin:    General: Skin is warm and dry.  Neurological:     General: No focal deficit present.     Mental Status: She is alert and oriented to person, place, and time.        I have independently reviewed the above radiologic studies and discussed with the patient   Recent Lab Findings: Lab Results  Component Value Date   WBC 6.2 05/12/2023   HGB 10.9 (L) 05/12/2023   HCT 33.4 (L) 05/12/2023   PLT 191 05/12/2023   GLUCOSE 167 (H) 05/12/2023   CHOL 165 06/29/2008   TRIG 206 06/29/2008   HDL 42 06/29/2008   LDLCALC 82 06/29/2008   ALT 39 (H) 06/16/2015   AST 30 06/16/2015   NA 135 05/12/2023   K 4.8 05/12/2023   CL 101 05/12/2023   CREATININE 0.98 05/12/2023   BUN 20 05/12/2023   CO2 27 05/12/2023   HGBA1C 6.2 (A) 11/29/2022      Assessment / Plan:   62yo female with severe AS.  She is quite symptomatic, and requires transport in a wheelchair today.  She has also had several pre-syncopal episodes with minimal activity of late.  Her left heart cath is clean.  I have admitted her to the hospital given  her symptoms, and recent falls.  We will plan on bAVR on 7/15     I  spent 40 minutes counseling the patient face to face.   Stacy Frost 05/13/2023 12:41 PM

## 2023-05-14 ENCOUNTER — Inpatient Hospital Stay (HOSPITAL_COMMUNITY): Payer: 59 | Admitting: Anesthesiology

## 2023-05-14 ENCOUNTER — Inpatient Hospital Stay (HOSPITAL_COMMUNITY): Payer: 59

## 2023-05-14 ENCOUNTER — Other Ambulatory Visit: Payer: Self-pay

## 2023-05-14 ENCOUNTER — Other Ambulatory Visit: Payer: Self-pay | Admitting: Cardiology

## 2023-05-14 ENCOUNTER — Encounter (HOSPITAL_COMMUNITY): Payer: Self-pay | Admitting: Thoracic Surgery (Cardiothoracic Vascular Surgery)

## 2023-05-14 ENCOUNTER — Encounter (HOSPITAL_COMMUNITY)
Admission: AD | Disposition: A | Discharge status: Home / Self Care | Payer: Self-pay | Source: Ambulatory Visit | Attending: Thoracic Surgery (Cardiothoracic Vascular Surgery)

## 2023-05-14 DIAGNOSIS — I358 Other nonrheumatic aortic valve disorders: Secondary | ICD-10-CM

## 2023-05-14 DIAGNOSIS — Z953 Presence of xenogenic heart valve: Secondary | ICD-10-CM

## 2023-05-14 DIAGNOSIS — Z952 Presence of prosthetic heart valve: Secondary | ICD-10-CM

## 2023-05-14 DIAGNOSIS — I10 Essential (primary) hypertension: Secondary | ICD-10-CM

## 2023-05-14 DIAGNOSIS — E119 Type 2 diabetes mellitus without complications: Secondary | ICD-10-CM

## 2023-05-14 DIAGNOSIS — I350 Nonrheumatic aortic (valve) stenosis: Secondary | ICD-10-CM

## 2023-05-14 HISTORY — PX: AORTIC VALVE REPLACEMENT: SHX41

## 2023-05-14 HISTORY — PX: TEE WITHOUT CARDIOVERSION: SHX5443

## 2023-05-14 LAB — CBC
HCT: 33.4 % — ABNORMAL LOW (ref 36.0–46.0)
HCT: 26.2 % — ABNORMAL LOW (ref 36.0–46.0)
Hemoglobin: 11.3 g/dL — ABNORMAL LOW (ref 12.0–15.0)
Hemoglobin: 8.8 g/dL — ABNORMAL LOW (ref 12.0–15.0)
MCH: 29.1 pg (ref 26.0–34.0)
MCH: 29.6 pg (ref 26.0–34.0)
MCHC: 33.8 g/dL (ref 30.0–36.0)
MCHC: 33.6 g/dL (ref 30.0–36.0)
MCV: 86.1 fL (ref 80.0–100.0)
MCV: 88.2 fL (ref 80.0–100.0)
Platelets: 176 10*3/uL (ref 150–400)
Platelets: 139 10*3/uL — ABNORMAL LOW (ref 150–400)
RBC: 3.88 MIL/uL (ref 3.87–5.11)
RBC: 2.97 MIL/uL — ABNORMAL LOW (ref 3.87–5.11)
RDW: 13.2 % (ref 11.5–15.5)
RDW: 13.1 % (ref 11.5–15.5)
WBC: 5.2 10*3/uL (ref 4.0–10.5)
WBC: 15.5 10*3/uL — ABNORMAL HIGH (ref 4.0–10.5)
nRBC: 0 % (ref 0.0–0.2)
nRBC: 0 % (ref 0.0–0.2)

## 2023-05-14 LAB — POCT I-STAT 7, (LYTES, BLD GAS, ICA,H+H)
Acid-Base Excess: 2 mmol/L (ref 0.0–2.0)
Acid-Base Excess: 1 mmol/L (ref 0.0–2.0)
Acid-Base Excess: 1 mmol/L (ref 0.0–2.0)
Acid-Base Excess: 0 mmol/L (ref 0.0–2.0)
Acid-base deficit: 1 mmol/L (ref 0.0–2.0)
Acid-base deficit: 3 mmol/L — ABNORMAL HIGH (ref 0.0–2.0)
Acid-base deficit: 4 mmol/L — ABNORMAL HIGH (ref 0.0–2.0)
Bicarbonate: 27.6 mmol/L (ref 20.0–28.0)
Bicarbonate: 26.3 mmol/L (ref 20.0–28.0)
Bicarbonate: 26.1 mmol/L (ref 20.0–28.0)
Bicarbonate: 24.4 mmol/L (ref 20.0–28.0)
Bicarbonate: 24.5 mmol/L (ref 20.0–28.0)
Bicarbonate: 22.1 mmol/L (ref 20.0–28.0)
Bicarbonate: 20.7 mmol/L (ref 20.0–28.0)
Calcium, Ion: 1.19 mmol/L (ref 1.15–1.40)
Calcium, Ion: 0.98 mmol/L — ABNORMAL LOW (ref 1.15–1.40)
Calcium, Ion: 1.04 mmol/L — ABNORMAL LOW (ref 1.15–1.40)
Calcium, Ion: 1.3 mmol/L (ref 1.15–1.40)
Calcium, Ion: 1.03 mmol/L — ABNORMAL LOW (ref 1.15–1.40)
Calcium, Ion: 1.16 mmol/L (ref 1.15–1.40)
Calcium, Ion: 1.17 mmol/L (ref 1.15–1.40)
HCT: 34 % — ABNORMAL LOW (ref 36.0–46.0)
HCT: 22 % — ABNORMAL LOW (ref 36.0–46.0)
HCT: 23 % — ABNORMAL LOW (ref 36.0–46.0)
HCT: 23 % — ABNORMAL LOW (ref 36.0–46.0)
HCT: 25 % — ABNORMAL LOW (ref 36.0–46.0)
HCT: 18 % — ABNORMAL LOW (ref 36.0–46.0)
HCT: 17 % — ABNORMAL LOW (ref 36.0–46.0)
Hemoglobin: 11.6 g/dL — ABNORMAL LOW (ref 12.0–15.0)
Hemoglobin: 7.5 g/dL — ABNORMAL LOW (ref 12.0–15.0)
Hemoglobin: 7.8 g/dL — ABNORMAL LOW (ref 12.0–15.0)
Hemoglobin: 7.8 g/dL — ABNORMAL LOW (ref 12.0–15.0)
Hemoglobin: 8.5 g/dL — ABNORMAL LOW (ref 12.0–15.0)
Hemoglobin: 6.1 g/dL — CL (ref 12.0–15.0)
Hemoglobin: 5.8 g/dL — CL (ref 12.0–15.0)
O2 Saturation: 99 %
O2 Saturation: 100 %
O2 Saturation: 100 %
O2 Saturation: 100 %
O2 Saturation: 98 %
O2 Saturation: 98 %
O2 Saturation: 88 %
Patient temperature: 36.7
Patient temperature: 37.3
Patient temperature: 37.7
Potassium: 3.8 mmol/L (ref 3.5–5.1)
Potassium: 3.6 mmol/L (ref 3.5–5.1)
Potassium: 3.9 mmol/L (ref 3.5–5.1)
Potassium: 4 mmol/L (ref 3.5–5.1)
Potassium: 4.6 mmol/L (ref 3.5–5.1)
Potassium: 4.5 mmol/L (ref 3.5–5.1)
Potassium: 4.4 mmol/L (ref 3.5–5.1)
Sodium: 136 mmol/L (ref 135–145)
Sodium: 137 mmol/L (ref 135–145)
Sodium: 134 mmol/L — ABNORMAL LOW (ref 135–145)
Sodium: 136 mmol/L (ref 135–145)
Sodium: 145 mmol/L (ref 135–145)
Sodium: 139 mmol/L (ref 135–145)
Sodium: 139 mmol/L (ref 135–145)
TCO2: 29 mmol/L (ref 22–32)
TCO2: 28 mmol/L (ref 22–32)
TCO2: 27 mmol/L (ref 22–32)
TCO2: 26 mmol/L (ref 22–32)
TCO2: 26 mmol/L (ref 22–32)
TCO2: 23 mmol/L (ref 22–32)
TCO2: 22 mmol/L (ref 22–32)
pCO2 arterial: 46.1 mmHg (ref 32–48)
pCO2 arterial: 43.6 mmHg (ref 32–48)
pCO2 arterial: 41.5 mmHg (ref 32–48)
pCO2 arterial: 36.3 mmHg (ref 32–48)
pCO2 arterial: 41.5 mmHg (ref 32–48)
pCO2 arterial: 41.5 mmHg (ref 32–48)
pCO2 arterial: 37.6 mmHg (ref 32–48)
pH, Arterial: 7.385 (ref 7.35–7.45)
pH, Arterial: 7.389 (ref 7.35–7.45)
pH, Arterial: 7.405 (ref 7.35–7.45)
pH, Arterial: 7.436 (ref 7.35–7.45)
pH, Arterial: 7.378 (ref 7.35–7.45)
pH, Arterial: 7.336 — ABNORMAL LOW (ref 7.35–7.45)
pH, Arterial: 7.351 (ref 7.35–7.45)
pO2, Arterial: 149 mmHg — ABNORMAL HIGH (ref 83–108)
pO2, Arterial: 431 mmHg — ABNORMAL HIGH (ref 83–108)
pO2, Arterial: 232 mmHg — ABNORMAL HIGH (ref 83–108)
pO2, Arterial: 387 mmHg — ABNORMAL HIGH (ref 83–108)
pO2, Arterial: 111 mmHg — ABNORMAL HIGH (ref 83–108)
pO2, Arterial: 106 mmHg (ref 83–108)
pO2, Arterial: 58 mmHg — ABNORMAL LOW (ref 83–108)

## 2023-05-14 LAB — BASIC METABOLIC PANEL
Anion gap: 9 (ref 5–15)
Anion gap: 9 (ref 5–15)
BUN: 20 mg/dL (ref 8–23)
BUN: 15 mg/dL (ref 8–23)
CO2: 26 mmol/L (ref 22–32)
CO2: 22 mmol/L (ref 22–32)
Calcium: 8.8 mg/dL — ABNORMAL LOW (ref 8.9–10.3)
Calcium: 8 mg/dL — ABNORMAL LOW (ref 8.9–10.3)
Chloride: 99 mmol/L (ref 98–111)
Chloride: 106 mmol/L (ref 98–111)
Creatinine, Ser: 0.89 mg/dL (ref 0.44–1.00)
Creatinine, Ser: 0.75 mg/dL (ref 0.44–1.00)
GFR, Estimated: >60 mL/min (ref >60)
GFR, Estimated: >60 mL/min (ref >60)
Glucose, Bld: 176 mg/dL — ABNORMAL HIGH (ref 70–99)
Glucose, Bld: 144 mg/dL — ABNORMAL HIGH (ref 70–99)
Potassium: 3.6 mmol/L (ref 3.5–5.1)
Potassium: 4.4 mmol/L (ref 3.5–5.1)
Sodium: 134 mmol/L — ABNORMAL LOW (ref 135–145)
Sodium: 137 mmol/L (ref 135–145)

## 2023-05-14 LAB — PROTIME-INR
INR: 1.1 (ref 0.8–1.2)
INR: 1.6 — ABNORMAL HIGH (ref 0.8–1.2)
Prothrombin Time: 13.9 seconds (ref 11.4–15.2)
Prothrombin Time: 19.4 seconds — ABNORMAL HIGH (ref 11.4–15.2)

## 2023-05-14 LAB — POCT I-STAT, CHEM 8
BUN: 20 mg/dL (ref 8–23)
BUN: 20 mg/dL (ref 8–23)
BUN: 19 mg/dL (ref 8–23)
BUN: 19 mg/dL (ref 8–23)
BUN: 19 mg/dL (ref 8–23)
Calcium, Ion: 1.14 mmol/L — ABNORMAL LOW (ref 1.15–1.40)
Calcium, Ion: 1.2 mmol/L (ref 1.15–1.40)
Calcium, Ion: 1.07 mmol/L — ABNORMAL LOW (ref 1.15–1.40)
Calcium, Ion: 1.07 mmol/L — ABNORMAL LOW (ref 1.15–1.40)
Calcium, Ion: 1.32 mmol/L (ref 1.15–1.40)
Chloride: 97 mmol/L — ABNORMAL LOW (ref 98–111)
Chloride: 98 mmol/L (ref 98–111)
Chloride: 98 mmol/L (ref 98–111)
Chloride: 100 mmol/L (ref 98–111)
Chloride: 98 mmol/L (ref 98–111)
Creatinine, Ser: 0.9 mg/dL (ref 0.44–1.00)
Creatinine, Ser: 0.9 mg/dL (ref 0.44–1.00)
Creatinine, Ser: 0.8 mg/dL (ref 0.44–1.00)
Creatinine, Ser: 0.8 mg/dL (ref 0.44–1.00)
Creatinine, Ser: 0.8 mg/dL (ref 0.44–1.00)
Glucose, Bld: 188 mg/dL — ABNORMAL HIGH (ref 70–99)
Glucose, Bld: 159 mg/dL — ABNORMAL HIGH (ref 70–99)
Glucose, Bld: 163 mg/dL — ABNORMAL HIGH (ref 70–99)
Glucose, Bld: 185 mg/dL — ABNORMAL HIGH (ref 70–99)
Glucose, Bld: 196 mg/dL — ABNORMAL HIGH (ref 70–99)
HCT: 33 % — ABNORMAL LOW (ref 36.0–46.0)
HCT: 31 % — ABNORMAL LOW (ref 36.0–46.0)
HCT: 23 % — ABNORMAL LOW (ref 36.0–46.0)
HCT: 22 % — ABNORMAL LOW (ref 36.0–46.0)
HCT: 23 % — ABNORMAL LOW (ref 36.0–46.0)
Hemoglobin: 11.2 g/dL — ABNORMAL LOW (ref 12.0–15.0)
Hemoglobin: 10.5 g/dL — ABNORMAL LOW (ref 12.0–15.0)
Hemoglobin: 7.8 g/dL — ABNORMAL LOW (ref 12.0–15.0)
Hemoglobin: 7.5 g/dL — ABNORMAL LOW (ref 12.0–15.0)
Hemoglobin: 7.8 g/dL — ABNORMAL LOW (ref 12.0–15.0)
Potassium: 3.8 mmol/L (ref 3.5–5.1)
Potassium: 3.6 mmol/L (ref 3.5–5.1)
Potassium: 3.9 mmol/L (ref 3.5–5.1)
Potassium: 4.1 mmol/L (ref 3.5–5.1)
Potassium: 4.2 mmol/L (ref 3.5–5.1)
Sodium: 135 mmol/L (ref 135–145)
Sodium: 136 mmol/L (ref 135–145)
Sodium: 136 mmol/L (ref 135–145)
Sodium: 135 mmol/L (ref 135–145)
Sodium: 136 mmol/L (ref 135–145)
TCO2: 29 mmol/L (ref 22–32)
TCO2: 29 mmol/L (ref 22–32)
TCO2: 30 mmol/L (ref 22–32)
TCO2: 27 mmol/L (ref 22–32)
TCO2: 29 mmol/L (ref 22–32)

## 2023-05-14 LAB — GLUCOSE, CAPILLARY
Glucose-Capillary: 170 mg/dL — ABNORMAL HIGH (ref 70–99)
Glucose-Capillary: 147 mg/dL — ABNORMAL HIGH (ref 70–99)
Glucose-Capillary: 148 mg/dL — ABNORMAL HIGH (ref 70–99)
Glucose-Capillary: 138 mg/dL — ABNORMAL HIGH (ref 70–99)
Glucose-Capillary: 105 mg/dL — ABNORMAL HIGH (ref 70–99)
Glucose-Capillary: 156 mg/dL — ABNORMAL HIGH (ref 70–99)
Glucose-Capillary: 139 mg/dL — ABNORMAL HIGH (ref 70–99)
Glucose-Capillary: 137 mg/dL — ABNORMAL HIGH (ref 70–99)
Glucose-Capillary: 166 mg/dL — ABNORMAL HIGH (ref 70–99)
Glucose-Capillary: 155 mg/dL — ABNORMAL HIGH (ref 70–99)
Glucose-Capillary: 145 mg/dL — ABNORMAL HIGH (ref 70–99)
Glucose-Capillary: 142 mg/dL — ABNORMAL HIGH (ref 70–99)

## 2023-05-14 LAB — CBC WITH DIFFERENTIAL/PLATELET
Abs Immature Granulocytes: 0.06 10*3/uL (ref 0.00–0.07)
Basophils Absolute: 0 10*3/uL (ref 0.0–0.1)
Basophils Relative: 0 %
Eosinophils Absolute: 0 10*3/uL (ref 0.0–0.5)
Eosinophils Relative: 0 %
HCT: 19.8 % — ABNORMAL LOW (ref 36.0–46.0)
Hemoglobin: 6.6 g/dL — CL (ref 12.0–15.0)
Immature Granulocytes: 1 %
Lymphocytes Relative: 6 %
Lymphs Abs: 0.6 10*3/uL — ABNORMAL LOW (ref 0.7–4.0)
MCH: 29.2 pg (ref 26.0–34.0)
MCHC: 33.3 g/dL (ref 30.0–36.0)
MCV: 87.6 fL (ref 80.0–100.0)
Monocytes Absolute: 0.6 10*3/uL (ref 0.1–1.0)
Monocytes Relative: 5 %
Neutro Abs: 9.5 10*3/uL — ABNORMAL HIGH (ref 1.7–7.7)
Neutrophils Relative %: 88 %
Platelets: 155 10*3/uL (ref 150–400)
RBC: 2.26 MIL/uL — ABNORMAL LOW (ref 3.87–5.11)
RDW: 13.2 % (ref 11.5–15.5)
WBC: 10.8 10*3/uL — ABNORMAL HIGH (ref 4.0–10.5)
nRBC: 0 % (ref 0.0–0.2)

## 2023-05-14 LAB — POCT I-STAT EG7
Acid-Base Excess: 2 mmol/L (ref 0.0–2.0)
Bicarbonate: 27.9 mmol/L (ref 20.0–28.0)
Calcium, Ion: 1.02 mmol/L — ABNORMAL LOW (ref 1.15–1.40)
HCT: 22 % — ABNORMAL LOW (ref 36.0–46.0)
Hemoglobin: 7.5 g/dL — ABNORMAL LOW (ref 12.0–15.0)
O2 Saturation: 84 %
Potassium: 3.5 mmol/L (ref 3.5–5.1)
Sodium: 137 mmol/L (ref 135–145)
TCO2: 29 mmol/L (ref 22–32)
pCO2, Ven: 47.8 mmHg (ref 44–60)
pH, Ven: 7.373 (ref 7.25–7.43)
pO2, Ven: 50 mmHg — ABNORMAL HIGH (ref 32–45)

## 2023-05-14 LAB — BLOOD GAS, ARTERIAL
Acid-Base Excess: 5.5 mmol/L — ABNORMAL HIGH (ref 0.0–2.0)
Bicarbonate: 28.9 mmol/L — ABNORMAL HIGH (ref 20.0–28.0)
O2 Saturation: 96.4 %
Patient temperature: 37
pCO2 arterial: 37 mmHg (ref 32–48)
pH, Arterial: 7.5 — ABNORMAL HIGH (ref 7.35–7.45)
pO2, Arterial: 71 mmHg — ABNORMAL LOW (ref 83–108)

## 2023-05-14 LAB — BPAM FFP: Blood Product Expiration Date: 202407162359

## 2023-05-14 LAB — HEMOGLOBIN AND HEMATOCRIT, BLOOD
HCT: 22.4 % — ABNORMAL LOW (ref 36.0–46.0)
Hemoglobin: 7.2 g/dL — ABNORMAL LOW (ref 12.0–15.0)

## 2023-05-14 LAB — PREPARE RBC (CROSSMATCH)

## 2023-05-14 LAB — HEMOGLOBIN A1C
Hgb A1c MFr Bld: 7.4 % — ABNORMAL HIGH (ref 4.8–5.6)
Mean Plasma Glucose: 165.68 mg/dL

## 2023-05-14 LAB — PREPARE PLATELET PHERESIS

## 2023-05-14 LAB — PLATELET COUNT: Platelets: 127 10*3/uL — ABNORMAL LOW (ref 150–400)

## 2023-05-14 LAB — APTT
aPTT: 29 seconds (ref 24–36)
aPTT: 40 seconds — ABNORMAL HIGH (ref 24–36)

## 2023-05-14 LAB — PREPARE FRESH FROZEN PLASMA: Unit division: 0

## 2023-05-14 LAB — BPAM PLATELET PHERESIS: Unit Type and Rh: 600

## 2023-05-14 LAB — MAGNESIUM: Magnesium: 2.8 mg/dL — ABNORMAL HIGH (ref 1.7–2.4)

## 2023-05-14 LAB — FIBRINOGEN: Fibrinogen: 227 mg/dL (ref 210–475)

## 2023-05-14 SURGERY — REPLACEMENT, AORTIC VALVE, OPEN
Anesthesia: General | Site: Chest

## 2023-05-14 MED ORDER — SODIUM CHLORIDE 0.9% IV SOLUTION: Freq: Once | INTRAVENOUS | Status: AC

## 2023-05-14 MED ORDER — HEPARIN SODIUM (PORCINE) 1000 UNIT/ML IJ SOLN
INTRAMUSCULAR | Status: DC | PRN
  Administered 2023-05-14: 27000 [IU] via INTRAVENOUS

## 2023-05-14 MED ORDER — ACETAMINOPHEN 160 MG/5ML PO SOLN: 1000.0000 mg | Freq: Four times a day (QID) | ORAL | Status: AC

## 2023-05-14 MED ORDER — METOPROLOL TARTRATE 25 MG/10 ML ORAL SUSPENSION: 12.5000 mg | Freq: Two times a day (BID) | ORAL | Status: DC

## 2023-05-14 MED ORDER — VANCOMYCIN HCL IN DEXTROSE 1-5 GM/200ML-% IV SOLN
1000.0000 mg | Freq: Once | INTRAVENOUS | Status: AC
  Administered 2023-05-14: 1000 mg via INTRAVENOUS
  Filled 2023-05-14: qty 200

## 2023-05-14 MED ORDER — CHLORHEXIDINE GLUCONATE 0.12 % MT SOLN
15.0000 mL | OROMUCOSAL | Status: AC
  Administered 2023-05-14: 15 mL via OROMUCOSAL
  Filled 2023-05-14: qty 15

## 2023-05-14 MED ORDER — BISACODYL 10 MG RE SUPP: 10.0000 mg | Freq: Every day | RECTAL | Status: DC

## 2023-05-14 MED ORDER — OXYCODONE HCL 5 MG PO TABS
5.0000 mg | ORAL_TABLET | ORAL | Status: DC | PRN
  Administered 2023-05-14 – 2023-05-17 (×11): 10 mg via ORAL
  Filled 2023-05-14 (×11): qty 2

## 2023-05-14 MED ORDER — SODIUM CHLORIDE (PF) 0.9 % IJ SOLN: OROMUCOSAL | Status: DC | PRN

## 2023-05-14 MED ORDER — PHENYLEPHRINE 80 MCG/ML (10ML) SYRINGE FOR IV PUSH (FOR BLOOD PRESSURE SUPPORT)
PREFILLED_SYRINGE | INTRAVENOUS | Status: DC | PRN
  Administered 2023-05-14: 240 ug via INTRAVENOUS
  Administered 2023-05-14 (×2): 80 ug via INTRAVENOUS
  Administered 2023-05-14 (×2): 160 ug via INTRAVENOUS
  Administered 2023-05-14: 80 ug via INTRAVENOUS
  Administered 2023-05-14: 120 ug via INTRAVENOUS
  Administered 2023-05-14 (×2): 80 ug via INTRAVENOUS

## 2023-05-14 MED ORDER — METOPROLOL TARTRATE 12.5 MG HALF TABLET: 12.5000 mg | ORAL_TABLET | Freq: Two times a day (BID) | ORAL | Status: DC

## 2023-05-14 MED ORDER — PROPOFOL 10 MG/ML IV BOLUS
INTRAVENOUS | Status: DC | PRN
  Administered 2023-05-14: 100 mg via INTRAVENOUS
  Administered 2023-05-14: 40 mg via INTRAVENOUS

## 2023-05-14 MED ORDER — INSULIN REGULAR(HUMAN) IN NACL 100-0.9 UT/100ML-% IV SOLN
INTRAVENOUS | Status: DC
  Administered 2023-05-14: 3.6 [IU]/h via INTRAVENOUS
  Filled 2023-05-14: qty 100

## 2023-05-14 MED ORDER — ROCURONIUM BROMIDE 10 MG/ML (PF) SYRINGE
PREFILLED_SYRINGE | INTRAVENOUS | Status: DC | PRN
  Administered 2023-05-14 (×2): 50 mg via INTRAVENOUS
  Administered 2023-05-14: 70 mg via INTRAVENOUS
  Administered 2023-05-14: 30 mg via INTRAVENOUS

## 2023-05-14 MED ORDER — PHENYLEPHRINE 80 MCG/ML (10ML) SYRINGE FOR IV PUSH (FOR BLOOD PRESSURE SUPPORT)
PREFILLED_SYRINGE | INTRAVENOUS | Status: AC
  Filled 2023-05-14: qty 20

## 2023-05-14 MED ORDER — DEXTROSE 50 % IV SOLN: 0.0000 mL | INTRAVENOUS | Status: DC | PRN

## 2023-05-14 MED ORDER — NOREPINEPHRINE 4 MG/250ML-% IV SOLN: 0.0000 ug/min | INTRAVENOUS | Status: DC

## 2023-05-14 MED ORDER — METOCLOPRAMIDE HCL 5 MG/ML IJ SOLN
10.0000 mg | Freq: Four times a day (QID) | INTRAMUSCULAR | Status: AC
  Administered 2023-05-14 – 2023-05-15 (×6): 10 mg via INTRAVENOUS
  Filled 2023-05-14 (×6): qty 2

## 2023-05-14 MED ORDER — PHENYLEPHRINE HCL-NACL 20-0.9 MG/250ML-% IV SOLN
0.0000 ug/min | INTRAVENOUS | Status: DC
  Administered 2023-05-14 (×2): 50 ug/min via INTRAVENOUS
  Administered 2023-05-14: 90 ug/min via INTRAVENOUS
  Filled 2023-05-14 (×2): qty 250

## 2023-05-14 MED ORDER — FENTANYL CITRATE (PF) 250 MCG/5ML IJ SOLN
INTRAMUSCULAR | Status: DC | PRN
  Administered 2023-05-14: 100 ug via INTRAVENOUS
  Administered 2023-05-14: 50 ug via INTRAVENOUS
  Administered 2023-05-14 (×4): 100 ug via INTRAVENOUS
  Administered 2023-05-14: 200 ug via INTRAVENOUS
  Administered 2023-05-14: 250 ug via INTRAVENOUS
  Administered 2023-05-14: 150 ug via INTRAVENOUS
  Administered 2023-05-14: 100 ug via INTRAVENOUS

## 2023-05-14 MED ORDER — ASPIRIN 81 MG PO CHEW
324.0000 mg | CHEWABLE_TABLET | Freq: Once | ORAL | Status: AC
  Administered 2023-05-14: 324 mg via ORAL
  Filled 2023-05-14: qty 4

## 2023-05-14 MED ORDER — SODIUM CHLORIDE 0.9% FLUSH
3.0000 mL | Freq: Two times a day (BID) | INTRAVENOUS | Status: DC
  Administered 2023-05-14 – 2023-05-17 (×6): 3 mL via INTRAVENOUS

## 2023-05-14 MED ORDER — SODIUM CHLORIDE 0.9 % IV SOLN: INTRAVENOUS | Status: DC | PRN

## 2023-05-14 MED ORDER — FENTANYL CITRATE (PF) 250 MCG/5ML IJ SOLN
INTRAMUSCULAR | Status: AC
  Filled 2023-05-14: qty 5

## 2023-05-14 MED ORDER — HEPARIN SODIUM (PORCINE) 1000 UNIT/ML IJ SOLN
INTRAMUSCULAR | Status: AC
  Filled 2023-05-14: qty 1

## 2023-05-14 MED ORDER — SODIUM CHLORIDE 0.9% FLUSH
10.0000 mL | Freq: Two times a day (BID) | INTRAVENOUS | Status: DC
  Administered 2023-05-14 – 2023-05-15 (×2): 10 mL
  Administered 2023-05-15: 30 mL
  Administered 2023-05-16: 10 mL
  Administered 2023-05-16: 40 mL
  Administered 2023-05-17 (×2): 10 mL

## 2023-05-14 MED ORDER — ASPIRIN 81 MG PO CHEW: 324.0000 mg | CHEWABLE_TABLET | Freq: Every day | ORAL | Status: DC

## 2023-05-14 MED ORDER — ARTIFICIAL TEARS OPHTHALMIC OINT
TOPICAL_OINTMENT | OPHTHALMIC | Status: AC
  Filled 2023-05-14: qty 3.5

## 2023-05-14 MED ORDER — LACTATED RINGERS IV SOLN: INTRAVENOUS | Status: DC

## 2023-05-14 MED ORDER — POTASSIUM CHLORIDE 10 MEQ/50ML IV SOLN: 10.0000 meq | INTRAVENOUS | Status: AC

## 2023-05-14 MED ORDER — ACETAMINOPHEN 500 MG PO TABS
1000.0000 mg | ORAL_TABLET | Freq: Four times a day (QID) | ORAL | Status: AC
  Administered 2023-05-14 – 2023-05-19 (×19): 1000 mg via ORAL
  Filled 2023-05-14 (×19): qty 2

## 2023-05-14 MED ORDER — PANTOPRAZOLE SODIUM 40 MG PO TBEC
40.0000 mg | DELAYED_RELEASE_TABLET | Freq: Every day | ORAL | Status: DC
  Administered 2023-05-16 – 2023-05-21 (×6): 40 mg via ORAL
  Filled 2023-05-14 (×6): qty 1

## 2023-05-14 MED ORDER — MIDAZOLAM HCL (PF) 10 MG/2ML IJ SOLN
INTRAMUSCULAR | Status: AC
  Filled 2023-05-14: qty 2

## 2023-05-14 MED ORDER — SODIUM CHLORIDE 0.9 % IV SOLN: 250.0000 mL | INTRAVENOUS | Status: DC

## 2023-05-14 MED ORDER — SODIUM CHLORIDE 0.45 % IV SOLN: INTRAVENOUS | Status: DC | PRN

## 2023-05-14 MED ORDER — DOCUSATE SODIUM 100 MG PO CAPS
200.0000 mg | ORAL_CAPSULE | Freq: Every day | ORAL | Status: DC
  Administered 2023-05-15 – 2023-05-21 (×7): 200 mg via ORAL
  Filled 2023-05-14 (×7): qty 2

## 2023-05-14 MED ORDER — MAGNESIUM SULFATE 4 GM/100ML IV SOLN
4.0000 g | Freq: Once | INTRAVENOUS | Status: AC
  Administered 2023-05-14: 4 g via INTRAVENOUS
  Filled 2023-05-14: qty 100

## 2023-05-14 MED ORDER — ROCURONIUM BROMIDE 10 MG/ML (PF) SYRINGE
PREFILLED_SYRINGE | INTRAVENOUS | Status: AC
  Filled 2023-05-14: qty 20

## 2023-05-14 MED ORDER — TRAMADOL HCL 50 MG PO TABS
50.0000 mg | ORAL_TABLET | ORAL | Status: DC | PRN
  Administered 2023-05-14 – 2023-05-18 (×9): 100 mg via ORAL
  Filled 2023-05-14 (×11): qty 2

## 2023-05-14 MED ORDER — SODIUM CHLORIDE 0.9 % IV SOLN: INTRAVENOUS | Status: DC

## 2023-05-14 MED ORDER — ALBUMIN HUMAN 5 % IV SOLN: INTRAVENOUS | Status: DC | PRN

## 2023-05-14 MED ORDER — BISACODYL 5 MG PO TBEC
10.0000 mg | DELAYED_RELEASE_TABLET | Freq: Every day | ORAL | Status: DC
  Administered 2023-05-15 – 2023-05-21 (×7): 10 mg via ORAL
  Filled 2023-05-14 (×7): qty 2

## 2023-05-14 MED ORDER — LACTATED RINGERS IV SOLN: INTRAVENOUS | Status: DC | PRN

## 2023-05-14 MED ORDER — EPHEDRINE SULFATE-NACL 50-0.9 MG/10ML-% IV SOSY
PREFILLED_SYRINGE | INTRAVENOUS | Status: DC | PRN
  Administered 2023-05-14 (×2): 5 mg via INTRAVENOUS

## 2023-05-14 MED ORDER — PROTAMINE SULFATE 10 MG/ML IV SOLN
INTRAVENOUS | Status: DC | PRN
  Administered 2023-05-14: 270 mg via INTRAVENOUS

## 2023-05-14 MED ORDER — CHLORHEXIDINE GLUCONATE CLOTH 2 % EX PADS
6.0000 | MEDICATED_PAD | Freq: Every day | CUTANEOUS | Status: DC
  Administered 2023-05-14 – 2023-05-17 (×4): 6 via TOPICAL

## 2023-05-14 MED ORDER — ONDANSETRON HCL 4 MG/2ML IJ SOLN: 4.0000 mg | Freq: Four times a day (QID) | INTRAMUSCULAR | Status: DC | PRN

## 2023-05-14 MED ORDER — PANTOPRAZOLE SODIUM 40 MG IV SOLR
40.0000 mg | Freq: Every day | INTRAVENOUS | Status: AC
  Administered 2023-05-14 – 2023-05-15 (×2): 40 mg via INTRAVENOUS
  Filled 2023-05-14 (×2): qty 10

## 2023-05-14 MED ORDER — SODIUM CHLORIDE 0.9% IV SOLUTION
Freq: Once | INTRAVENOUS | Status: AC
  Administered 2023-05-14: 30 mL via INTRAVENOUS

## 2023-05-14 MED ORDER — SODIUM CHLORIDE 0.9% FLUSH: 10.0000 mL | INTRAVENOUS | Status: DC | PRN

## 2023-05-14 MED ORDER — ALBUMIN HUMAN 5 % IV SOLN
250.0000 mL | INTRAVENOUS | Status: DC | PRN
  Administered 2023-05-14 (×4): 12.5 g via INTRAVENOUS
  Filled 2023-05-14 (×2): qty 250

## 2023-05-14 MED ORDER — PHENYLEPHRINE HCL-NACL 20-0.9 MG/250ML-% IV SOLN
INTRAVENOUS | Status: AC
  Filled 2023-05-14: qty 250

## 2023-05-14 MED ORDER — FENTANYL CITRATE PF 50 MCG/ML IJ SOSY
50.0000 ug | PREFILLED_SYRINGE | INTRAMUSCULAR | Status: DC | PRN
  Administered 2023-05-14 – 2023-05-16 (×4): 50 ug via INTRAVENOUS
  Filled 2023-05-14 (×4): qty 1

## 2023-05-14 MED ORDER — MIDAZOLAM HCL 2 MG/2ML IJ SOLN: 2.0000 mg | INTRAMUSCULAR | Status: DC | PRN

## 2023-05-14 MED ORDER — DEXMEDETOMIDINE HCL IN NACL 400 MCG/100ML IV SOLN
0.0000 ug/kg/h | INTRAVENOUS | Status: DC
  Administered 2023-05-14: 0.4 ug/kg/h via INTRAVENOUS
  Filled 2023-05-14: qty 100

## 2023-05-14 MED ORDER — LEVOFLOXACIN IN D5W 750 MG/150ML IV SOLN
750.0000 mg | INTRAVENOUS | Status: AC
  Administered 2023-05-15: 750 mg via INTRAVENOUS
  Filled 2023-05-14: qty 150

## 2023-05-14 MED ORDER — MIDAZOLAM HCL (PF) 5 MG/ML IJ SOLN
INTRAMUSCULAR | Status: DC | PRN
  Administered 2023-05-14: 3 mg via INTRAVENOUS
  Administered 2023-05-14: 2 mg via INTRAVENOUS
  Administered 2023-05-14 (×2): 1 mg via INTRAVENOUS

## 2023-05-14 MED ORDER — ASPIRIN 325 MG PO TBEC
325.0000 mg | DELAYED_RELEASE_TABLET | Freq: Every day | ORAL | Status: DC
  Administered 2023-05-15 – 2023-05-21 (×7): 325 mg via ORAL
  Filled 2023-05-14 (×7): qty 1

## 2023-05-14 MED ORDER — ACETAMINOPHEN 160 MG/5ML PO SOLN
650.0000 mg | Freq: Once | ORAL | Status: AC
  Administered 2023-05-14: 650 mg
  Filled 2023-05-14: qty 20.3

## 2023-05-14 MED ORDER — NITROGLYCERIN IN D5W 200-5 MCG/ML-% IV SOLN: 0.0000 ug/min | INTRAVENOUS | Status: DC

## 2023-05-14 MED ORDER — PROPOFOL 10 MG/ML IV BOLUS
INTRAVENOUS | Status: AC
  Filled 2023-05-14: qty 20

## 2023-05-14 MED ORDER — SODIUM CHLORIDE 0.9% FLUSH: 3.0000 mL | INTRAVENOUS | Status: DC | PRN

## 2023-05-14 MED ORDER — PROTAMINE SULFATE 10 MG/ML IV SOLN
INTRAVENOUS | Status: AC
  Filled 2023-05-14: qty 25

## 2023-05-14 MED ORDER — METOPROLOL TARTRATE 5 MG/5ML IV SOLN: 2.5000 mg | INTRAVENOUS | Status: DC | PRN

## 2023-05-14 MED ORDER — PLASMA-LYTE A IV SOLN: INTRAVENOUS | Status: DC | PRN

## 2023-05-14 MED ORDER — EPHEDRINE 5 MG/ML INJ
INTRAVENOUS | Status: AC
  Filled 2023-05-14: qty 5

## 2023-05-14 SURGICAL SUPPLY — 74 items
BAG DECANTER FOR FLEXI CONT (MISCELLANEOUS) ×2 IMPLANT
BLADE STERNUM SYSTEM 6 (BLADE) ×2 IMPLANT
BLADE SURG 15 STRL LF DISP TIS (BLADE) ×2 IMPLANT
BLADE SURG 15 STRL SS (BLADE) ×2
CANISTER SUCT 3000ML PPV (MISCELLANEOUS) ×2 IMPLANT
CANNULA MC2 2 STG 29/37 NON-V (CANNULA) IMPLANT
CANNULA NON VENT 20FR 12 (CANNULA) ×2 IMPLANT
CANNULA SUMP PERICARDIAL (CANNULA) IMPLANT
CATH HEART VENT LEFT (CATHETERS) ×2 IMPLANT
CATH ROBINSON RED A/P 18FR (CATHETERS) ×6 IMPLANT
CNTNR URN SCR LID CUP LEK RST (MISCELLANEOUS) ×2 IMPLANT
CONNECTOR BLAKE 2:1 CARIO BLK (MISCELLANEOUS) IMPLANT
CONT SPEC 4OZ STRL OR WHT (MISCELLANEOUS) ×2
CONTAINER PROTECT SURGISLUSH (MISCELLANEOUS) ×4 IMPLANT
COVER SURGICAL LIGHT HANDLE (MISCELLANEOUS) ×2 IMPLANT
DEVICE SUT CK QUICK LOAD INDV (Prosthesis & Implant Heart) IMPLANT
DEVICE SUT CK QUICK LOAD MINI (Prosthesis & Implant Heart) IMPLANT
DRAIN CHANNEL 19F RND (DRAIN) ×2 IMPLANT
DRAPE CARDIOVASCULAR INCISE (DRAPES) ×2
DRAPE SRG 135X102X78XABS (DRAPES) ×2 IMPLANT
DRAPE WARM FLUID 44X44 (DRAPES) ×2 IMPLANT
DRESSING AQUACEL AG SP 3.5X10 (GAUZE/BANDAGES/DRESSINGS) IMPLANT
DRSG AQUACEL AG SP 3.5X10 (GAUZE/BANDAGES/DRESSINGS) ×2
ELECT CAUTERY BLADE 6.4 (BLADE) ×2 IMPLANT
ELECT REM PT RETURN 9FT ADLT (ELECTROSURGICAL) ×4
ELECTRODE REM PT RTRN 9FT ADLT (ELECTROSURGICAL) ×4 IMPLANT
FELT TEFLON 1X6 (MISCELLANEOUS) ×4 IMPLANT
GAUZE SPONGE 4X4 12PLY STRL (GAUZE/BANDAGES/DRESSINGS) ×2 IMPLANT
GLOVE BIO SURGEON STRL SZ 6 (GLOVE) IMPLANT
GLOVE BIO SURGEON STRL SZ 6.5 (GLOVE) IMPLANT
GLOVE BIO SURGEON STRL SZ7.5 (GLOVE) IMPLANT
GLOVE SURG SS PI 7.5 STRL IVOR (GLOVE) IMPLANT
GOWN STRL REUS W/ TWL LRG LVL3 (GOWN DISPOSABLE) ×8 IMPLANT
GOWN STRL REUS W/ TWL XL LVL3 (GOWN DISPOSABLE) ×4 IMPLANT
GOWN STRL REUS W/TWL LRG LVL3 (GOWN DISPOSABLE) ×8
GOWN STRL REUS W/TWL XL LVL3 (GOWN DISPOSABLE) ×4
HEMOSTAT POWDER SURGIFOAM 1G (HEMOSTASIS) ×4 IMPLANT
HEMOSTAT SURGICEL 2X14 (HEMOSTASIS) ×2 IMPLANT
INSERT FOGARTY XLG (MISCELLANEOUS) IMPLANT
INSERT SUTURE HOLDER (MISCELLANEOUS) ×2 IMPLANT
KIT BASIN OR (CUSTOM PROCEDURE TRAY) ×2 IMPLANT
KIT SUCTION CATH 14FR (SUCTIONS) ×2 IMPLANT
KIT SUT CK MINI COMBO 4X17 (Prosthesis & Implant Heart) IMPLANT
KIT TURNOVER KIT B (KITS) ×2 IMPLANT
LEAD PACING MYOCARDI (MISCELLANEOUS) IMPLANT
LINE VENT (MISCELLANEOUS) IMPLANT
NS IRRIG 1000ML POUR BTL (IV SOLUTION) ×12 IMPLANT
ORGANIZER SUTURE GABBAY-FRATER (MISCELLANEOUS) ×2 IMPLANT
PACK E OPEN HEART (SUTURE) ×2 IMPLANT
PACK OPEN HEART (CUSTOM PROCEDURE TRAY) ×2 IMPLANT
PAD ARMBOARD 7.5X6 YLW CONV (MISCELLANEOUS) ×4 IMPLANT
POSITIONER HEAD DONUT 9IN (MISCELLANEOUS) ×2 IMPLANT
SET MPS 3-ND DEL (MISCELLANEOUS) IMPLANT
SUT BONE WAX W31G (SUTURE) ×2 IMPLANT
SUT EB EXC GRN/WHT 2-0 V-5 (SUTURE) ×4 IMPLANT
SUT ETHIBOND X763 2 0 SH 1 (SUTURE) IMPLANT
SUT PDS AB 1 CTX 36 (SUTURE) IMPLANT
SUT PROLENE 3 0 SH DA (SUTURE) IMPLANT
SUT PROLENE 3 0 SH1 36 (SUTURE) ×2 IMPLANT
SUT PROLENE 4 0 RB 1 (SUTURE) ×8
SUT PROLENE 4 0 SH DA (SUTURE) IMPLANT
SUT PROLENE 4-0 RB1 .5 CRCL 36 (SUTURE) ×6 IMPLANT
SUT STEEL 6MS V (SUTURE) IMPLANT
SUT STEEL STERNAL CCS#1 18IN (SUTURE) IMPLANT
SYSTEM SAHARA CHEST DRAIN ATS (WOUND CARE) ×2 IMPLANT
TAPE CLOTH SURG 4X10 WHT LF (GAUZE/BANDAGES/DRESSINGS) IMPLANT
TAPE PAPER 2X10 WHT MICROPORE (GAUZE/BANDAGES/DRESSINGS) IMPLANT
TOWEL GREEN STERILE (TOWEL DISPOSABLE) ×2 IMPLANT
TOWEL GREEN STERILE FF (TOWEL DISPOSABLE) ×2 IMPLANT
TRAY FOLEY SLVR 16FR TEMP STAT (SET/KITS/TRAYS/PACK) ×2 IMPLANT
UNDERPAD 30X36 HEAVY ABSORB (UNDERPADS AND DIAPERS) ×2 IMPLANT
VALVE AORTIC SZ25 INSP/RESIL (Prosthesis & Implant Heart) IMPLANT
VENT LEFT HEART 12002 (CATHETERS) ×2
WATER STERILE IRR 1000ML POUR (IV SOLUTION) ×4 IMPLANT

## 2023-05-14 NOTE — Progress Notes (Signed)
      [address]       [name] [address]             [phone]      S/p AVR  Recently extubated  BP (!) 95/57   Pulse 100   Temp 99.3 F (37.4 C)   Resp 15   Ht 5\' 6"  (1.676 m)   Wt 80.3 kg   SpO2 94%   BMI 28.57 kg/m  CVP 14 CI 3 by FloTrac Neo at 80  Intake/Output Summary (Last 24 hours) at 05/14/2023 1758 Last data filed at 05/14/2023 1757 Gross per 24 hour  Intake 5760.44 ml  Output 2790 ml  Net 2970.44 ml   CT 690 since OR CT output high initially but < 100 ml/hr last 2 hours HGb 6.1 on ABG- await CBC result which is in progress, may need transfusion  Viviann Spare C. Dorris Fetch, MD [facility] [phone]

## 2023-05-14 NOTE — Procedures (Signed)
Extubation Procedure Note  Patient Details:   Name: Stacy Frost DOB: 1961/01/29 MRN: [id number]   Airway Documentation:    Vent end date: 05/14/23 Vent end time: 1734   Evaluation  O2 sats: stable throughout Complications: No apparent complications Patient did tolerate procedure well. Bilateral Breath Sounds: Clear, Diminished   Yes  Pt extubated to 2L [place], Pt tolerating well at this time. Cuff leak present, no stridor noted, RN at bedside, Vitals stable, RT will monitor as needed.   Thornell Mule 05/14/2023, 5:48 PM

## 2023-05-14 NOTE — Anesthesia Procedure Notes (Signed)
Central Venous Catheter Insertion Performed by: Achille Rich, MD, anesthesiologist Start/End7/15/2024 7:01 AM, 05/14/2023 7:11 AM Patient location: Pre-op. Preanesthetic checklist: patient identified, IV checked, site marked, risks and benefits discussed, surgical consent, monitors and equipment checked, pre-op evaluation, timeout performed and anesthesia consent Lidocaine 1% used for infiltration and patient sedated Hand hygiene performed  and maximum sterile barriers used  Catheter size: 8.5 Fr Sheath introducer Procedure performed using ultrasound guided technique. Ultrasound Notes:anatomy identified, needle tip was noted to be adjacent to the nerve/plexus identified, no ultrasound evidence of intravascular and/or intraneural injection and image(s) printed for medical record Attempts: 1 Following insertion, line sutured and dressing applied. Post procedure assessment: blood return through all ports, free fluid flow and no air  Patient tolerated the procedure well with no immediate complications.

## 2023-05-14 NOTE — Anesthesia Procedure Notes (Signed)
Arterial Line Insertion Start/End7/15/2024 6:45 AM, 05/14/2023 6:55 AM Performed by: CRNA  Patient location: Pre-op. Preanesthetic checklist: patient identified, IV checked, site marked, risks and benefits discussed, surgical consent, monitors and equipment checked, pre-op evaluation, timeout performed and anesthesia consent Lidocaine 1% used for infiltration Left, radial was placed Catheter size: 20 G Hand hygiene performed  and maximum sterile barriers used  Allen's test indicative of satisfactory collateral circulation Attempts: 1 Procedure performed without using ultrasound guided technique. Following insertion, dressing applied and Biopatch. Post procedure assessment: normal and unchanged  Post procedure complications: local hematoma. Patient tolerated the procedure well with no immediate complications.

## 2023-05-14 NOTE — Progress Notes (Signed)
Pt achieved a VC of 1.42 and NIF of -15 with great pt effort on all attempts. Pt tolerated well,RN at bedside, RT will monitor as needed.      05/14/23 1734  Daily Weaning Assessment  Daily Assessment of Readiness to Wean Wean protocol criteria met (SBT performed)  SBT Method CPAP 5 cm H20 and PS 5 cm H20 (10/5)  Weaning Start Time 0718  VC Weaning (S)  1.42 L/min  NIF (S)  -40 cmH2O  Patient response Passed (Tolerated well)

## 2023-05-14 NOTE — Anesthesia Procedure Notes (Signed)
Procedure Name: Intubation Date/Time: 05/14/2023 7:55 AM  Performed by: Randon Goldsmith, CRNAPre-anesthesia Checklist: Patient identified, Emergency Drugs available, Suction available and Patient being monitored Patient Re-evaluated:Patient Re-evaluated prior to induction Oxygen Delivery Method: Circle system utilized Preoxygenation: Pre-oxygenation with 100% oxygen Induction Type: IV induction Ventilation: Oral airway inserted - appropriate to patient size and Two handed mask ventilation required Laryngoscope Size: Mac and 3 Grade View: Grade I Tube type: Oral Tube size: 8.0 mm Number of attempts: 1 Airway Equipment and Method: Stylet and Oral airway Placement Confirmation: ETT inserted through vocal cords under direct vision, positive ETCO2 and breath sounds checked- equal and bilateral Secured at: 22 cm Tube secured with: Tape Dental Injury: Teeth and Oropharynx as per pre-operative assessment  Comments: Performed by Earleen Reaper, SRNA

## 2023-05-14 NOTE — Transfer of Care (Signed)
Immediate Anesthesia Transfer of Care Note  Patient: Stacy Frost  Procedure(s) Performed: AORTIC VALVE REPLACEMENT (AVR) (Chest) TRANSESOPHAGEAL ECHOCARDIOGRAM  Patient Location: ICU  Anesthesia Type:General  Level of Consciousness: Patient remains intubated per anesthesia plan  Airway & Oxygen Therapy: Patient remains intubated per anesthesia plan and Patient placed on Ventilator (see vital sign flow sheet for setting)  Post-op Assessment: Report given to RN and Post -op Vital signs reviewed and stable  Post vital signs: Reviewed and stable  Last Vitals:  Vitals Value Taken Time  BP    Temp 36.7 C 05/14/23 1216  Pulse 73 05/14/23 1216  Resp 16 05/14/23 1216  SpO2 98 % 05/14/23 1216  Vitals shown include unfiled device data.  Last Pain:  Vitals:   05/14/23 0357  TempSrc: Oral  PainSc:       Patients Stated Pain Goal: 2 (05/13/23 2203)  Complications: No notable events documented.

## 2023-05-14 NOTE — Progress Notes (Signed)
Admitted 7/12 for urgent AVR eval. Underwent AVR today with Dr. Cliffton Asters.  Patient on precedex, insulin, and phenylephrine drip. TOC following.

## 2023-05-14 NOTE — Op Note (Signed)
[address]       [name] [address]             [phone]                                           05/14/2023 Patient:  Stacy Frost Pre-Op Dx: Severe Aortic Stenosis   Post-op Dx:  same Procedure: Aortic valve replacement with a 25mm Inspiris Valve     Surgeon and Role:      * Alvita Fana, Eliezer Lofts, MD - Primary    * Gaynelle Arabian - PA-C  An experienced assistant was required given the complexity of this surgery and the standard of surgical care. The assistant was needed for exposure, dissection, suctioning, retraction of delicate tissues and sutures, instrument exchange and for overall help during this procedure.    Anesthesia  general EBL:  Blood Administration: none Xclamp Time:  92 min Pump Time:   Drains: 47 F blake drain: mediastinal  X 2 Wires: ventricular Counts: correct   Indications: 62yo female with severe AS. She is quite symptomatic, and requires transport in a wheelchair today. She has also had several pre-syncopal episodes with minimal activity of late. Her left heart cath is clean. I have admitted her to the hospital given her symptoms, and recent falls. We will plan on bAVR on 7/15   Findings: Bicuspid aortic valve with fusion of the right and left commissure.  Heavily calcified annulus.  Operative Technique: All invasive lines were placed in pre-op holding.  After the risks, benefits and alternatives were thoroughly discussed, the patient was brought to the operative theatre.  Anesthesia was induced, and the patient was prepped and draped in normal sterile fashion.  An appropriate surgical pause was performed, and pre-operative antibiotics were dosed accordingly.  We began with an incision over the chest for the sternotomy.  This was carried down with bovie cautery, and the sternum was divided with a reciprocating saw.  Meticulous hemostasis was obtained.  The patient was systemically heparinized.   The sternal retractor  was placed.  The pericardium was divided in the midline and fashioned into a cradle with pericardial stitches.   After we confirmed an appropriate ACT, the ascending aorta was cannulated in standard fashion.  The right atrial appendage was used for venous cannulation site.  Cardiopulmonary bypass was initiated and we began to cool the patient to 32 degrees. The cross clamp was applied, and a dose of anterograde cardioplegia was given with good arrest of the heart.  Our aortotomy was made and directed toward the non coronary cusp.  The valve was inspected.  All leaflets were excised.  The annulus was sized to a 25mm Inspiris.  The left ventricle was then copiously irrigated.  Pledgeted mattress sutures were placed circumferentially through the annulus.  These sutures were then passed through the sewing ring of the valve.  Once the valve was seated in the annulus, it was secured with Core-knot sutures.  We began to rewarm, and close our aortotomy in 2 layers.  A re-animation dose of cardioplegia was given.  After de-airing the heart, the aortic cross clamp was removed.  We checked our valve function, and for air using the TEE.  Once we were satisfying, we separated from cardiopulmonary bypass without event.    The heparin was reversed with protamine, and hemostasis was  obtained.  Chest tubes and wires were placed, and the sternum was re-approximated with with sternal wires.  The soft tissue and skin were re-approximated wth absorbable suture.    The patient tolerated the procedure without any immediate complications, and was transferred to the ICU in guarded condition.  Khris Jansson Keane Scrape

## 2023-05-14 NOTE — Hospital Course (Addendum)
History of Present Illness:       Stacy Frost is a 61 yo female with history of diabetes mellitus (type 2), migraine headache, and bicuspid aortic valve.  She was referred to Dr. Cliffton Asters for evaluation for severe aortic stenosis and was seen in our office today.  Upon evaluation the patient admitted to suffering syncopal episodes at home while using the bathroom.  Due to this, it was felt patient required urgent admission and surgical intervention to decrease risk of sudden cardiac death.  Currently, the patient is sitting on the edge of the bed, awaiting nurse to obtain IV. On the monitor, SR with HR in the 80's. She denies chest pain or shortness of breath.  Hospital Course:  [name] remained stable following hospital admission.  She was taken to the operative room on 05/14/2023 where the aortic valve was replaced with a 25 mm [employer] Inspiris bovine pericardial tissue valve.  Following procedure, she separated from cardiopulmonary bypass without difficulty.  She was transferred to the ICU in stable condition.  Respiratory status and hemodynamics remained stable.  She was weaned from mechanical ventilation and extubated on the day of surgery.  Monitoring lines were removed.  She was mobilized. She had an expected acute blood loss anemia with Hct 22% on post op day 2 so was transfused with 1 unit PRBC's.  Diabetes mellitus was managed initially with an insulin drip and later transitioned to Levemir and SSI. She will be restarted on Metformin closer to discharge and [facility] after discharge. Her pre op HGA1C was 7.4. Persistent vasoplegia was managed with oral Midodrine. She was started on low-dose metoprolol, ASA, and a statin after surgery. She was diuresed routinely for expected volume excess. DVT prophylaxis was addressed with daily subQ enoxaparin and ambulation. She was ready for transfer to [facility] on post-op day 3. Diuresis was continued. With Midodrine already started,  Lopressor was titrated to 25 mg bid (sinus tachycardia) on 07/19.  She has been tolerating a diet. She was given a laxative to assist with bowel movement. Her sternal wound is clean, dry, healing without signs of infection. She had expiratory wheezing so Xopenex was given. She is ambulating with good oxygenation on room air.

## 2023-05-14 NOTE — Consult Note (Signed)
NAME:  Stacy Frost, MRN:  [id number], DOB:  Mar 21, 1961, LOS: 3 ADMISSION DATE:  05/11/2023, CONSULTATION DATE:  05/14/23 REFERRING MD:  Cliffton Asters, CHIEF COMPLAINT:  Syncope   History of Present Illness:  62 year old woman who presented to [facility] clinic with progressive DOE, presyncopal symptoms found to have severe aortic stenosis with bicuspid aortic valve.  Admitted 7/12 for urgent AVR eval.  Underwent AVR today with Dr. Cliffton Asters.  Pump time 113 min, crossclamp 92, EBL 500cc.  Arrives to unit sedated on vent. PCCM consulted for further management.  Pertinent  Medical History  DM HLD GERD HTN  Significant Hospital Events: Including procedures, antibiotic start and stop dates in addition to other pertinent events   7/12 admit 7/15 AVR  Interim History / Subjective:  Consult  Objective   Blood pressure 92/60, pulse 77, temperature 98.4 F (36.9 C), temperature source Oral, resp. rate 12, height 5\' 6"  (1.676 m), weight 80.3 kg, SpO2 92%.        Intake/Output Summary (Last 24 hours) at 05/14/2023 1212 Last data filed at 05/14/2023 1202 Gross per 24 hour  Intake 3090 ml  Output 1625 ml  Net 1465 ml   Filed Weights   05/11/23 1423 05/14/23 0652  Weight: 80.3 kg 80.3 kg    Examination: General: no distress, intubated, sedated paralyzed HENT: small, equal, reactive Lungs: clear and passive on vent Cardiovascular: RRR, +murmur, ext warm Abdomen: soft, hypoactive BS Extremities: no edema Neuro: paralyzed Skin: sternotomy site dressed without strikethrough; mediastinal drain small bloody output  CXR pending Periop ABG looks good Preop labs benign A1c 7.4%  Resolved Hospital Problem list   N/A  Assessment & Plan:  Severe aortic valve stenosis s/p mechanical AVR 05/14/23 Postop vent management Postop ABLA expected Postop vasoplegia expected DM2  RLS HTN Chronic pain Class 3 obesity  - Vent support, rapid wean pathway - Avoid acidemia, hypothermia, coagulopathy -  Neo for MAP 65 - Insulin gtt for now targeting 140-180 - Usual transfusion thresholds - Monitor mediastinal drain output - Will follow while in ICU  Best Practice (right click and "Reselect all SmartList Selections" daily)   Diet/type: NPO DVT prophylaxis: SCD GI prophylaxis: PPI Lines: Central line Foley:  Yes, and it is still needed Code Status:  full code Last date of multidisciplinary goals of care discussion [per primary]  Labs   CBC: Recent Labs  Lab 05/12/23 0152 05/14/23 0704 05/14/23 0809 05/14/23 1010 05/14/23 1017 05/14/23 1048 05/14/23 1117 05/14/23 1120  WBC 6.2 5.2  --   --   --   --   --   --   HGB 10.9* 11.3*   < > 7.2* 7.8* 7.8* 7.5* 7.8*  HCT 33.4* 33.4*   < > 22.4* 23.0* 23.0* 22.0* 23.0*  MCV 85.9 86.1  --   --   --   --   --   --   PLT 191 176  --  127*  --   --   --   --    < > = values in this interval not displayed.    Basic Metabolic Panel: Recent Labs  Lab 05/12/23 0152 05/14/23 0704 05/14/23 0809 05/14/23 0814 05/14/23 0855 05/14/23 0915 05/14/23 0954 05/14/23 1017 05/14/23 1048 05/14/23 1117 05/14/23 1120  NA 135 134* 135   < > 136   < > 136 134* 135 136 136  K 4.8 3.6 3.8   < > 3.6   < > 3.9 3.9 4.2 4.1 4.0  CL 101  99 97*  --  98  --  98  --  98 100  --   CO2 27 26  --   --   --   --   --   --   --   --   --   GLUCOSE 167* 176* 188*  --  159*  --  163*  --  196* 185*  --   BUN 20 20 20   --  20  --  19  --  19 19  --   CREATININE 0.98 0.89 0.90  --  0.90  --  0.80  --  0.80 0.80  --   CALCIUM 9.2 8.8*  --   --   --   --   --   --   --   --   --    < > = values in this interval not displayed.   GFR: Estimated Creatinine Clearance: 76.9 mL/min (by C-G formula based on SCr of 0.8 mg/dL). Recent Labs  Lab 05/12/23 0152 05/14/23 0704  WBC 6.2 5.2    Liver Function Tests: No results for input(s): "AST", "ALT", "ALKPHOS", "BILITOT", "PROT", "ALBUMIN" in the last 168 hours. No results for input(s): "LIPASE", "AMYLASE" in the  last 168 hours. No results for input(s): "AMMONIA" in the last 168 hours.  ABG    Component Value Date/Time   PHART 7.436 05/14/2023 1120   PCO2ART 36.3 05/14/2023 1120   PO2ART 387 (H) 05/14/2023 1120   HCO3 24.4 05/14/2023 1120   TCO2 26 05/14/2023 1120   ACIDBASEDEF 3.0 (H) 05/02/2023 1059   O2SAT 100 05/14/2023 1120     Coagulation Profile: Recent Labs  Lab 05/14/23 0704  INR 1.1    Cardiac Enzymes: No results for input(s): "CKTOTAL", "CKMB", "CKMBINDEX", "TROPONINI" in the last 168 hours.  HbA1C: Hemoglobin A1C  Date/Time Value Ref Range Status  11/29/2022 09:05 AM 6.2 (A) 4.0 - 5.6 % Final  07/25/2022 09:08 AM 7.8 (A) 4.0 - 5.6 % Final  03/30/2022 12:00 AM 8.6  Final   Hgb A1c MFr Bld  Date/Time Value Ref Range Status  05/13/2023 04:21 PM 7.4 (H) 4.8 - 5.6 % Final    Comment:    (NOTE) Pre diabetes:          5.7%-6.4%  Diabetes:              >6.4%  Glycemic control for   <7.0% adults with diabetes   06/27/2018 09:30 AM 6.7 (H) 4.8 - 5.6 % Final    Comment:    (NOTE) Pre diabetes:          5.7%-6.4% Diabetes:              >6.4% Glycemic control for   <7.0% adults with diabetes     CBG: Recent Labs  Lab 05/13/23 0846 05/13/23 1156 05/13/23 1615 05/13/23 2159 05/14/23 0653  GLUCAP 244* 150* 130* 167* 170*    Review of Systems:   Intubated/sedated  Past Medical History:  She,  has a past medical history of Anxiety, Aortic stenosis, Bicuspid aortic valve, Essential hypertension, benign, GERD (gastroesophageal reflux disease), History of cardiac catheterization, Hyperlipidemia, Migraine headache, MVP (mitral valve prolapse), Obesity, Pain in joint, ankle and foot, Palpitations, Restless leg syndrome, Skin tag, Type 2 diabetes mellitus (HCC), and Unilateral primary osteoarthritis, right knee (09/06/2016).   Surgical History:   Past Surgical History:  Procedure Laterality Date   ACHILLES TENDON REPAIR  2003   Left   CARDIAC CATHETERIZATION  2008   Normal coronary arteries; normal LV systolic function; mild dilation of aortic root; mild dilation of proximal ascending aorta; likely bicuspid aortic valve   CHOLECYSTECTOMY  2001   "Poor EF at 17%"   COLONOSCOPY N/A 07/17/2014   Procedure: COLONOSCOPY;  Surgeon: Malissa Hippo, MD;  Location: [facility];  Service: Endoscopy;  Laterality: N/A;  105   ESOPHAGEAL DILATION N/A 06/11/2015   Procedure: ESOPHAGEAL DILATION;  Surgeon: Malissa Hippo, MD;  Location: [facility];  Service: Endoscopy;  Laterality: N/AElease Hashimoto 54/56/58   ESOPHAGOGASTRODUODENOSCOPY N/A 07/17/2014   Procedure: ESOPHAGOGASTRODUODENOSCOPY (EGD);  Surgeon: Malissa Hippo, MD;  Location: [facility];  Service: Endoscopy;  Laterality: N/A;   ESOPHAGOGASTRODUODENOSCOPY (EGD) WITH PROPOFOL N/A 06/11/2015   Procedure: ESOPHAGOGASTRODUODENOSCOPY (EGD) WITH PROPOFOL;  Surgeon: Malissa Hippo, MD;  Location: [facility];  Service: Endoscopy;  Laterality: N/A;  730   HERNIA REPAIR     umbilical    KNEE ARTHROSCOPY WITH MEDIAL MENISECTOMY Right 07/02/2018   Procedure: RIGHT KNEE ARTHROSCOPY, DEBRIDEMENT, MEDIAL MENISECTOMY;  Surgeon: [name], MD;  Location: [facility];  Service: Orthopedics;  Laterality: Right;   KNEE SURGERY     MALONEY DILATION N/A 07/17/2014   Procedure: [name] DILATION;  Surgeon: Malissa Hippo, MD;  Location: [facility];  Service: Endoscopy;  Laterality: N/A;   RIGHT/LEFT HEART CATH AND CORONARY ANGIOGRAPHY N/A 05/02/2023   Procedure: RIGHT/LEFT HEART CATH AND CORONARY ANGIOGRAPHY;  Surgeon: Swaziland, Peter M, MD;  Location: [facility] INVASIVE CV LAB;  Service: Cardiovascular;  Laterality: N/A;   UMBILICAL HERNIA REPAIR  2003     Social History:   reports that she has never smoked. She has never used smokeless tobacco. She reports that she does not drink alcohol and does not use drugs.   Family History:  Her family history includes Anxiety disorder in her daughter; Brain cancer in her paternal grandmother;  Cancer in her mother; Depression in her daughter, father, and mother; Diabetes in her mother; Emphysema in her maternal grandfather; Heart attack in her father and paternal grandfather; Hypertension in her father; Stroke in her daughter; Transient ischemic attack in her brother.   Allergies Allergies  Allergen Reactions   Amlodipine Besylate Shortness Of Breath   Morphine And Codeine Shortness Of Breath and Other (See Comments)    Chest pain   Amitriptyline Other (See Comments)    Unknown reaction   Butorphanol Tartrate Other (See Comments)    REACTION: ED visit and got for migraine - not sure of response   Compazine [Prochlorperazine Edisylate] Other (See Comments)    Altered mental status   Rocephin [Ceftriaxone Sodium In Dextrose]     Given at GYN for infection - nauseated, dizziness (w/in last 7-8 years)   Sumatriptan Other (See Comments)    REACTION: HTN     Home Medications  Prior to Admission medications   Medication Sig Start Date End Date Taking? Authorizing Provider  acetaminophen-codeine (TYLENOL #3) 300-30 MG tablet Take 1 tablet by mouth 2 (two) times daily as needed. 04/05/20  Yes [provider]  aspirin EC 81 MG tablet Take 1 tablet (81 mg total) by mouth daily. 07/03/18  Yes Petrarca, Oris Drone, PA-C  celecoxib (CELEBREX) 200 MG capsule Take 200 mg by mouth 2 (two) times daily. 04/20/23  Yes [provider]  citalopram (CELEXA) 40 MG tablet Take 40 mg by mouth daily. 03/03/20  Yes [provider]  clonazePAM (KLONOPIN) 0.5 MG tablet Take 0.5 mg  by mouth 2 (two) times daily as needed. 04/24/23  Yes [provider]  cyclobenzaprine (FLEXERIL) 10 MG tablet Take 10 mg by mouth 3 (three) times daily. 04/04/23  Yes [provider]  furosemide (LASIX) 20 MG tablet Take 40 mg by mouth daily.    Yes [provider]  lisinopril (PRINIVIL,ZESTRIL) 20 MG tablet Take 20 mg by mouth daily.    Yes [provider]  metFORMIN  (GLUCOPHAGE) 1000 MG tablet Take 1 tablet (1,000 mg total) by mouth 2 (two) times daily. Patient taking differently: Take 1,000 mg by mouth daily with breakfast. 05/05/23  Yes [place], Peter M, MD  metoprolol (TOPROL-XL) 200 MG 24 hr tablet Take 200 mg by mouth daily.     Yes [provider]  MOUNJARO 7.5 MG/0.5ML Pen INJECT 7.5MG  UNDER THE SKIN ONCE A WEEK 04/11/23  Yes Reardon, Freddi Starr, NP  nitroGLYCERIN (NITROSTAT) 0.4 MG SL tablet Place 1 tablet (0.4 mg total) under the tongue every 5 (five) minutes as needed for chest pain. 11/23/17 05/11/23 Yes Jonelle Sidle, MD  potassium chloride SA (K-DUR,KLOR-CON) 20 MEQ tablet Take 20 mEq by mouth daily.    Yes [provider]  pramipexole (MIRAPEX) 0.5 MG tablet Take 0.5 mg by mouth at bedtime. 04/05/20  Yes [provider]  promethazine (PHENERGAN) 25 MG tablet Take 25 mg by mouth 2 (two) times daily as needed.   Yes [provider]  simvastatin (ZOCOR) 40 MG tablet Take 40 mg by mouth daily.   Yes [provider]  traZODone (DESYREL) 100 MG tablet Take 100 mg by mouth at bedtime. 03/03/20  Yes [provider]  Continuous Blood Gluc Receiver (DEXCOM G7 RECEIVER) DEVI USE 1 receiver continuously 07/05/22   [provider]  Continuous Blood Gluc Sensor (DEXCOM G7 SENSOR) MISC APPLY 1 SENSOR EVERY 10 DAYS 07/05/22   [provider]     Critical care time: 31 mins independent of procedures

## 2023-05-14 NOTE — Brief Op Note (Signed)
05/11/2023 - 05/14/2023  11:14 AM  PATIENT:  Stacy Frost  62 y.o. female  PRE-OPERATIVE DIAGNOSIS:  CRITICAL AORTIC VALVE STENOSIS  POST-OPERATIVE DIAGNOSIS:  CRITICAL AORTIC VALVE STENOSIS  PROCEDURE:   AORTIC VALVE REPLACEMENT  TRANSESOPHAGEAL ECHOCARDIOGRAM   SURGEON: Corliss Skains, MD - Primary  PHYSICIAN ASSISTANT: Deandria Klute  ASSISTANTS: Farrel Demark, Scrub Person         Waldron Labs, RN, RN First Assistant   ANESTHESIA:   general  EBL:   BLOOD ADMINISTERED:none  DRAINS:  Mediastinal Blake drains    LOCAL MEDICATIONS USED:  NONE  SPECIMEN: Aortic valve leaflets  DISPOSITION OF SPECIMEN:  PATHOLOGY  COUNTS: Correct  DICTATION: .Dragon Dictation  PLAN OF CARE: Admit to inpatient   PATIENT DISPOSITION:  ICU - intubated and hemodynamically stable.   Delay start of Pharmacological VTE agent (>24hrs) due to surgical blood loss or risk of bleeding: yes

## 2023-05-14 NOTE — Progress Notes (Signed)
Dr. Cliffton Asters ok with patient not being covid tested.  Patient is asymptomatic and has had no recent exposure

## 2023-05-14 NOTE — Progress Notes (Signed)
1730 - Hgb 6.1 on ABG. Dr Dorris Fetch aware. Verbal orders to await for CBC results and give 2 units RBC if hgb < 6.5 and 1 unit if hgb < 7.5   1857 - critical hgb 6.6, 1 unit RBC ordered per Dr Hendrickson's orders

## 2023-05-14 NOTE — Anesthesia Preprocedure Evaluation (Signed)
Anesthesia Evaluation  Patient identified by MRN, date of birth, ID band Patient awake    Reviewed: Allergy & Precautions, H&P , NPO status , Patient's Chart, lab work & pertinent test results  Airway Mallampati: II   Neck ROM: full    Dental   Pulmonary neg pulmonary ROS   breath sounds clear to auscultation       Cardiovascular hypertension, + Valvular Problems/Murmurs AS  Rhythm:regular Rate:Normal  Severe AS. Bicuspid aortic valve. Normal LVEF   Neuro/Psych  Headaches  Anxiety        GI/Hepatic   Endo/Other  diabetes, Type 2    Renal/GU      Musculoskeletal  (+) Arthritis ,    Abdominal   Peds  Hematology   Anesthesia Other Findings   Reproductive/Obstetrics                             Anesthesia Physical Anesthesia Plan  ASA: 3  Anesthesia Plan: General   Post-op Pain Management:    Induction: Intravenous  PONV Risk Score and Plan: 3 and Ondansetron, Dexamethasone, Midazolam and Treatment may vary due to age or medical condition  Airway Management Planned: Oral ETT  Additional Equipment: Arterial line, CVP, TEE and Ultrasound Guidance Line Placement  Intra-op Plan:   Post-operative Plan: Post-operative intubation/ventilation  Informed Consent: I have reviewed the patients History and Physical, chart, labs and discussed the procedure including the risks, benefits and alternatives for the proposed anesthesia with the patient or authorized representative who has indicated his/her understanding and acceptance.     Dental advisory given  Plan Discussed with: CRNA, Anesthesiologist and Surgeon  Anesthesia Plan Comments:        Anesthesia Quick Evaluation

## 2023-05-14 NOTE — Progress Notes (Signed)
     [address]       [place] [address]             [phone]       No events Vitals:   05/14/23 0357 05/14/23 0637  BP: 105/67   Pulse: 85 91  Resp: 18 16  Temp: 98.4 F (36.9 C)   SpO2:  97%   Alert NAD Sinus  EWOB  OR today for AVR  Stacy Frost Stacy Frost

## 2023-05-15 ENCOUNTER — Encounter (HOSPITAL_COMMUNITY): Payer: Self-pay | Admitting: Thoracic Surgery (Cardiothoracic Vascular Surgery)

## 2023-05-15 ENCOUNTER — Inpatient Hospital Stay (HOSPITAL_COMMUNITY): Payer: 59

## 2023-05-15 DIAGNOSIS — I350 Nonrheumatic aortic (valve) stenosis: Secondary | ICD-10-CM | POA: Diagnosis not present

## 2023-05-15 LAB — BASIC METABOLIC PANEL
Anion gap: 10 (ref 5–15)
Anion gap: 8 (ref 5–15)
BUN: 15 mg/dL (ref 8–23)
BUN: 23 mg/dL (ref 8–23)
CO2: 21 mmol/L — ABNORMAL LOW (ref 22–32)
CO2: 21 mmol/L — ABNORMAL LOW (ref 22–32)
Calcium: 8.3 mg/dL — ABNORMAL LOW (ref 8.9–10.3)
Calcium: 8 mg/dL — ABNORMAL LOW (ref 8.9–10.3)
Chloride: 103 mmol/L (ref 98–111)
Chloride: 104 mmol/L (ref 98–111)
Creatinine, Ser: 0.81 mg/dL (ref 0.44–1.00)
Creatinine, Ser: 1.3 mg/dL — ABNORMAL HIGH (ref 0.44–1.00)
GFR, Estimated: >60 mL/min (ref >60)
GFR, Estimated: 46 mL/min — ABNORMAL LOW (ref >60)
Glucose, Bld: 131 mg/dL — ABNORMAL HIGH (ref 70–99)
Glucose, Bld: 279 mg/dL — ABNORMAL HIGH (ref 70–99)
Potassium: 4.2 mmol/L (ref 3.5–5.1)
Potassium: 4.7 mmol/L (ref 3.5–5.1)
Sodium: 134 mmol/L — ABNORMAL LOW (ref 135–145)
Sodium: 133 mmol/L — ABNORMAL LOW (ref 135–145)

## 2023-05-15 LAB — CBC
HCT: 23.3 % — ABNORMAL LOW (ref 36.0–46.0)
HCT: 22 % — ABNORMAL LOW (ref 36.0–46.0)
Hemoglobin: 8 g/dL — ABNORMAL LOW (ref 12.0–15.0)
Hemoglobin: 7.1 g/dL — ABNORMAL LOW (ref 12.0–15.0)
MCH: 30 pg (ref 26.0–34.0)
MCH: 29 pg (ref 26.0–34.0)
MCHC: 34.3 g/dL (ref 30.0–36.0)
MCHC: 32.3 g/dL (ref 30.0–36.0)
MCV: 87.3 fL (ref 80.0–100.0)
MCV: 89.8 fL (ref 80.0–100.0)
Platelets: 145 10*3/uL — ABNORMAL LOW (ref 150–400)
Platelets: 179 10*3/uL (ref 150–400)
RBC: 2.67 MIL/uL — ABNORMAL LOW (ref 3.87–5.11)
RBC: 2.45 MIL/uL — ABNORMAL LOW (ref 3.87–5.11)
RDW: 14 % (ref 11.5–15.5)
RDW: 14.7 % (ref 11.5–15.5)
WBC: 9.3 10*3/uL (ref 4.0–10.5)
WBC: 14.8 10*3/uL — ABNORMAL HIGH (ref 4.0–10.5)
nRBC: 0 % (ref 0.0–0.2)
nRBC: 0 % (ref 0.0–0.2)

## 2023-05-15 LAB — PREPARE FRESH FROZEN PLASMA

## 2023-05-15 LAB — BPAM PLATELET PHERESIS
Blood Product Expiration Date: 202407182359
ISSUE DATE / TIME: 202407151533

## 2023-05-15 LAB — GLUCOSE, CAPILLARY
Glucose-Capillary: 111 mg/dL — ABNORMAL HIGH (ref 70–99)
Glucose-Capillary: 129 mg/dL — ABNORMAL HIGH (ref 70–99)
Glucose-Capillary: 130 mg/dL — ABNORMAL HIGH (ref 70–99)
Glucose-Capillary: 119 mg/dL — ABNORMAL HIGH (ref 70–99)
Glucose-Capillary: 190 mg/dL — ABNORMAL HIGH (ref 70–99)
Glucose-Capillary: 162 mg/dL — ABNORMAL HIGH (ref 70–99)
Glucose-Capillary: 216 mg/dL — ABNORMAL HIGH (ref 70–99)
Glucose-Capillary: 272 mg/dL — ABNORMAL HIGH (ref 70–99)
Glucose-Capillary: 213 mg/dL — ABNORMAL HIGH (ref 70–99)

## 2023-05-15 LAB — PREPARE PLATELET PHERESIS: Unit division: 0

## 2023-05-15 LAB — BPAM FFP
ISSUE DATE / TIME: 202407151533
Unit Type and Rh: 6200

## 2023-05-15 LAB — SURGICAL PATHOLOGY

## 2023-05-15 LAB — MAGNESIUM
Magnesium: 2.4 mg/dL (ref 1.7–2.4)
Magnesium: 2.2 mg/dL (ref 1.7–2.4)

## 2023-05-15 LAB — PREPARE RBC (CROSSMATCH)

## 2023-05-15 MED ORDER — MIDODRINE HCL 5 MG PO TABS
10.0000 mg | ORAL_TABLET | Freq: Three times a day (TID) | ORAL | Status: DC
  Administered 2023-05-15 – 2023-05-18 (×10): 10 mg via ORAL
  Filled 2023-05-15 (×10): qty 2

## 2023-05-15 MED ORDER — ENOXAPARIN SODIUM 40 MG/0.4ML IJ SOSY
40.0000 mg | PREFILLED_SYRINGE | Freq: Every day | INTRAMUSCULAR | Status: DC
  Administered 2023-05-15 – 2023-05-20 (×6): 40 mg via SUBCUTANEOUS
  Filled 2023-05-15 (×6): qty 0.4

## 2023-05-15 MED ORDER — ALBUMIN HUMAN 5 % IV SOLN
25.0000 g | Freq: Once | INTRAVENOUS | Status: AC
  Administered 2023-05-15: 25 g via INTRAVENOUS

## 2023-05-15 MED ORDER — SODIUM CHLORIDE 0.9% IV SOLUTION: Freq: Once | INTRAVENOUS | Status: DC

## 2023-05-15 MED ORDER — METOPROLOL TARTRATE 25 MG PO TABS
25.0000 mg | ORAL_TABLET | Freq: Two times a day (BID) | ORAL | Status: DC
  Administered 2023-05-15: 25 mg via ORAL
  Filled 2023-05-15 (×2): qty 1

## 2023-05-15 MED ORDER — PHENYLEPHRINE HCL-NACL 20-0.9 MG/250ML-% IV SOLN
0.0000 ug/min | INTRAVENOUS | Status: DC
  Administered 2023-05-15: 75 ug/min via INTRAVENOUS
  Filled 2023-05-15: qty 250

## 2023-05-15 MED ORDER — MIDODRINE HCL 5 MG PO TABS: 10.0000 mg | ORAL_TABLET | Freq: Three times a day (TID) | ORAL | Status: DC

## 2023-05-15 MED ORDER — FUROSEMIDE 10 MG/ML IJ SOLN
40.0000 mg | Freq: Once | INTRAMUSCULAR | Status: AC
  Administered 2023-05-15: 40 mg via INTRAVENOUS
  Filled 2023-05-15: qty 4

## 2023-05-15 NOTE — Progress Notes (Signed)
   NAME:  Stacy Frost, MRN:  [id number], DOB:  25-Nov-1960, LOS: 4 ADMISSION DATE:  05/11/2023, CONSULTATION DATE:  05/14/23 REFERRING MD:  Cliffton Asters, CHIEF COMPLAINT:  Syncope   History of Present Illness:  62 year old woman who presented to [facility] clinic with progressive DOE, presyncopal symptoms found to have severe aortic stenosis with bicuspid aortic valve.  Admitted 7/12 for urgent AVR eval.  Underwent AVR today with Dr. Cliffton Asters.  Pump time 113 min, crossclamp 92, EBL 500cc.  Arrives to unit sedated on vent. PCCM consulted for further management.  Pertinent  Medical History  DM HLD GERD HTN  Significant Hospital Events: Including procedures, antibiotic start and stop dates in addition to other pertinent events   7/12 admit 7/15 AVR  Interim History / Subjective:  Extubated, doing well  Objective   Blood pressure 126/66, pulse (!) 108, temperature 100.2 F (37.9 C), resp. rate (!) 24, height 5\' 6"  (1.676 m), weight 92.7 kg, SpO2 94%. CVP:  [3 mmHg-33 mmHg] 33 mmHg  Vent Mode: PSV;CPAP FiO2 (%):  [40 %-50 %] 40 % Set Rate:  [4 bmp-16 bmp] 4 bmp Vt Set:  [470 mL] 470 mL PEEP:  [5 cmH20] 5 cmH20 Pressure Support:  [10 cmH20] 10 cmH20   Intake/Output Summary (Last 24 hours) at 05/15/2023 0915 Last data filed at 05/15/2023 0700 Gross per 24 hour  Intake 6390.6 ml  Output 3470 ml  Net 2920.6 ml   Filed Weights   05/11/23 1423 05/14/23 0652 05/15/23 0535  Weight: 80.3 kg 80.3 kg 92.7 kg    Examination: No distress Reduced breath sound bases Minimal mediastinal drain output IS around [phone] Ext warm Foley in place Aox3 Sternotomy dressed without strikethrough  CBG looks good, to transition to basal bolus Got a unit of blood last night with good response  Resolved Hospital Problem list   N/A  Assessment & Plan:  Severe aortic valve stenosis s/p mechanical AVR 05/14/23 Postop vent management- resolved Postop ABLA expected- 1 unit 7/16 am Postop vasoplegia  expected DM2 w/ hyperglycemia RLS HTN Chronic pain Class 3 obesity  - Encourage IS - Transition to levemir SSI - Line management and GDMT per TCTS - Advance diet - Will follow while in ICU  Myrla Halsted MD PCCM

## 2023-05-15 NOTE — Progress Notes (Signed)
      [address]       [place] [address]             [phone]                 1 Day Post-Op Procedure(s) (LRB): AORTIC VALVE REPLACEMENT (AVR) (N/A) TRANSESOPHAGEAL ECHOCARDIOGRAM (N/A)   Events: No events extubated _______________________________________________________________ Vitals: BP (!) 96/53   Pulse (!) 107   Temp (!) 100.9 F (38.3 C)   Resp 17   Ht 5\' 6"  (1.676 m)   Wt 92.7 kg   SpO2 94%   BMI 32.99 kg/m  Filed Weights   05/11/23 1423 05/14/23 0652 05/15/23 0535  Weight: 80.3 kg 80.3 kg 92.7 kg     - Neuro: alert NAD  - Cardiovascular: sinus tach  Drips: none.   CVP:  [3 mmHg-24 mmHg] 14 mmHg  - Pulm: EWOB Vent Mode: PSV;CPAP FiO2 (%):  [40 %-50 %] 40 % Set Rate:  [4 bmp-16 bmp] 4 bmp Vt Set:  [470 mL] 470 mL PEEP:  [5 cmH20] 5 cmH20 Pressure Support:  [10 cmH20] 10 cmH20  ABG    Component Value Date/Time   PHART 7.351 05/14/2023 1833   PCO2ART 37.6 05/14/2023 1833   PO2ART 58 (L) 05/14/2023 1833   HCO3 20.7 05/14/2023 1833   TCO2 22 05/14/2023 1833   ACIDBASEDEF 4.0 (H) 05/14/2023 1833   O2SAT 88 05/14/2023 1833    - Abd: ND - Extremity: warm  .Intake/Output      07/15 0701 07/16 0700 07/16 0701 07/17 0700   I.V. (mL/kg) 4173.5 (45)    Blood 1327.8    IV Piggyback 2039.2    Total Intake(mL/kg) 7540.6 (81.3)    Urine (mL/kg/hr) 1970 (0.9)    Blood 825    Chest Tube 1000    Total Output 3795    Net +3745.6            _______________________________________________________________ Labs:    Latest Ref Rng & Units 05/15/2023    4:02 AM 05/14/2023    6:33 PM 05/14/2023    5:31 PM  CBC  WBC 4.0 - 10.5 K/uL 9.3     Hemoglobin 12.0 - 15.0 g/dL 8.0  5.8  6.1   Hematocrit 36.0 - 46.0 % 23.3  17.0  18.0   Platelets 150 - 400 K/uL 145         Latest Ref Rng & Units 05/15/2023    4:02 AM 05/14/2023    6:33 PM 05/14/2023    5:31 PM  CMP  Glucose 70 - 99 mg/dL 213     BUN 8 - 23 mg/dL 15     Creatinine  0.86 - 1.00 mg/dL 5.78     Sodium 469 - 629 mmol/L 134  139  139   Potassium 3.5 - 5.1 mmol/L 4.2  4.4  4.5   Chloride 98 - 111 mmol/L 103     CO2 22 - 32 mmol/L 21     Calcium 8.9 - 10.3 mg/dL 8.3       CXR: PV congestion  _______________________________________________________________  Assessment and Plan: POD 1 s/p bAVR  Neuro: pain controlled CV: Will titrated BB for HR and BP Pulm: IS, ambulation Renal: will diurese today GI: on diet Heme: stable ID: afebrile Endo: SSI Dispo: continue ICU care.  Possible floor today.   Corliss Skains 05/15/2023 7:53 AM

## 2023-05-15 NOTE — Progress Notes (Signed)
     [address]       [name] [address]             [phone]       EVENING ROUNDS  POD #1 SP AVR Needing volume and neo Foley pulled earlier. Awaiting void

## 2023-05-16 ENCOUNTER — Inpatient Hospital Stay (HOSPITAL_COMMUNITY): Payer: 59

## 2023-05-16 DIAGNOSIS — I350 Nonrheumatic aortic (valve) stenosis: Secondary | ICD-10-CM | POA: Diagnosis not present

## 2023-05-16 LAB — BASIC METABOLIC PANEL
Anion gap: 9 (ref 5–15)
BUN: 24 mg/dL — ABNORMAL HIGH (ref 8–23)
CO2: 23 mmol/L (ref 22–32)
Calcium: 8.4 mg/dL — ABNORMAL LOW (ref 8.9–10.3)
Chloride: 102 mmol/L (ref 98–111)
Creatinine, Ser: 1.07 mg/dL — ABNORMAL HIGH (ref 0.44–1.00)
GFR, Estimated: 58 mL/min — ABNORMAL LOW (ref >60)
Glucose, Bld: 196 mg/dL — ABNORMAL HIGH (ref 70–99)
Potassium: 3.9 mmol/L (ref 3.5–5.1)
Sodium: 134 mmol/L — ABNORMAL LOW (ref 135–145)

## 2023-05-16 LAB — GLUCOSE, CAPILLARY
Glucose-Capillary: 196 mg/dL — ABNORMAL HIGH (ref 70–99)
Glucose-Capillary: 218 mg/dL — ABNORMAL HIGH (ref 70–99)
Glucose-Capillary: 160 mg/dL — ABNORMAL HIGH (ref 70–99)
Glucose-Capillary: 170 mg/dL — ABNORMAL HIGH (ref 70–99)
Glucose-Capillary: 160 mg/dL — ABNORMAL HIGH (ref 70–99)

## 2023-05-16 LAB — HEMOGLOBIN AND HEMATOCRIT, BLOOD
HCT: 29.2 % — ABNORMAL LOW (ref 36.0–46.0)
Hemoglobin: 9.7 g/dL — ABNORMAL LOW (ref 12.0–15.0)

## 2023-05-16 LAB — PREPARE RBC (CROSSMATCH)

## 2023-05-16 LAB — CBC
HCT: 23.3 % — ABNORMAL LOW (ref 36.0–46.0)
Hemoglobin: 7.8 g/dL — ABNORMAL LOW (ref 12.0–15.0)
MCH: 29.7 pg (ref 26.0–34.0)
MCHC: 33.5 g/dL (ref 30.0–36.0)
MCV: 88.6 fL (ref 80.0–100.0)
Platelets: 114 10*3/uL — ABNORMAL LOW (ref 150–400)
RBC: 2.63 MIL/uL — ABNORMAL LOW (ref 3.87–5.11)
RDW: 14.6 % (ref 11.5–15.5)
WBC: 8.4 10*3/uL (ref 4.0–10.5)
nRBC: 0 % (ref 0.0–0.2)

## 2023-05-16 MED ORDER — FUROSEMIDE 10 MG/ML IJ SOLN
40.0000 mg | Freq: Once | INTRAMUSCULAR | Status: AC
  Administered 2023-05-16: 40 mg via INTRAVENOUS
  Filled 2023-05-16: qty 4

## 2023-05-16 MED ORDER — SODIUM CHLORIDE 0.9% IV SOLUTION: Freq: Once | INTRAVENOUS | Status: AC

## 2023-05-16 NOTE — Progress Notes (Signed)
      [address]       [place] [address]             [phone]                 2 Days Post-Op Procedure(s) (LRB): AORTIC VALVE REPLACEMENT (AVR) (N/A) TRANSESOPHAGEAL ECHOCARDIOGRAM (N/A)   Events: No events Some orthostasis _______________________________________________________________ Vitals: BP 106/60   Pulse 100   Temp (!) 97.5 F (36.4 C)   Resp 17   Ht 5\' 6"  (1.676 m)   Wt 90.3 kg   SpO2 95%   BMI 32.13 kg/m  Filed Weights   05/14/23 0652 05/15/23 0535 05/16/23 0540  Weight: 80.3 kg 92.7 kg 90.3 kg     - Neuro: alert NAD  - Cardiovascular: sinus   Drips: none.      - Pulm: EWOB    ABG    Component Value Date/Time   PHART 7.351 05/14/2023 1833   PCO2ART 37.6 05/14/2023 1833   PO2ART 58 (L) 05/14/2023 1833   HCO3 20.7 05/14/2023 1833   TCO2 22 05/14/2023 1833   ACIDBASEDEF 4.0 (H) 05/14/2023 1833   O2SAT 88 05/14/2023 1833    - Abd: ND - Extremity: warm  .Intake/Output      07/16 0701 07/17 0700 07/17 0701 07/18 0700   I.V. (mL/kg) 726.4 (8)    Blood 523.6    IV Piggyback 149.9    Total Intake(mL/kg) 1400 (15.5)    Urine (mL/kg/hr) 860 (0.4)    Blood     Chest Tube 170    Total Output 1030    Net +370         Urine Occurrence 2 x       _______________________________________________________________ Labs:    Latest Ref Rng & Units 05/16/2023    4:39 AM 05/15/2023    4:32 PM 05/15/2023    4:02 AM  CBC  WBC 4.0 - 10.5 K/uL 8.4  14.8  9.3   Hemoglobin 12.0 - 15.0 g/dL 7.8  7.1  8.0   Hematocrit 36.0 - 46.0 % 23.3  22.0  23.3   Platelets 150 - 400 K/uL 114  179  145       Latest Ref Rng & Units 05/16/2023    4:39 AM 05/15/2023    4:32 PM 05/15/2023    4:02 AM  CMP  Glucose 70 - 99 mg/dL 536  644  034   BUN 8 - 23 mg/dL 24  23  15    Creatinine 0.44 - 1.00 mg/dL 7.42  5.95  6.38   Sodium 135 - 145 mmol/L 134  133  134   Potassium 3.5 - 5.1 mmol/L 3.9  4.7  4.2   Chloride 98 - 111 mmol/L 102  104  103    CO2 22 - 32 mmol/L 23  21  21    Calcium 8.9 - 10.3 mg/dL 8.4  8.0  8.3     CXR: stable  _______________________________________________________________  Assessment and Plan: POD 2 s/p bAVR  Neuro: pain controlled CV: Will remove wires Pulm: IS, ambulation Renal: will diurese today GI: on diet Heme: stable.  Will transfuse today ID: afebrile Endo: SSI Dispo: continue ICU care.  Zenda Alpers 05/16/2023 9:13 AM

## 2023-05-16 NOTE — Progress Notes (Signed)
   NAME:  Stacy Frost, MRN:  [id number], DOB:  [birth year], LOS: 5 ADMISSION DATE:  05/11/2023, CONSULTATION DATE:  05/14/23 REFERRING MD:  Cliffton Asters, CHIEF COMPLAINT:  Syncope   History of Present Illness:  62 year old woman who presented to TCTS clinic with progressive DOE, presyncopal symptoms found to have severe aortic stenosis with bicuspid aortic valve.  Admitted 7/12 for urgent AVR eval.  Underwent AVR today with Dr. Cliffton Asters.  Pump time 113 min, crossclamp 92, EBL 500cc.  Arrives to unit sedated on vent. PCCM consulted for further management.  Pertinent  Medical History  DM HLD GERD HTN  Significant Hospital Events: Including procedures, antibiotic start and stop dates in addition to other pertinent events   7/12 admit 7/15 AVR  Interim History / Subjective:  Not much urine overnight. Needed another unit of blood for dropping BP last night. Neo when standing up, midodrine started.  Objective   Blood pressure 106/60, pulse 100, temperature (!) 97.5 F (36.4 C), resp. rate 17, height 5\' 6"  (1.676 m), weight 90.3 kg, SpO2 95%.        Intake/Output Summary (Last 24 hours) at 05/16/2023 0845 Last data filed at 05/16/2023 0600 Gross per 24 hour  Intake 1378.05 ml  Output 1020 ml  Net 358.05 ml   Filed Weights   05/14/23 4098 05/15/23 0535 05/16/23 0540  Weight: 80.3 kg 92.7 kg 90.3 kg    Examination: No distress Sternum wrapped Drains in place minimal bloody output Aox3 Moves to command Abd soft  Cr looks okay Plts down slightly Hgb 7.1 >> 1 unit >> 7.8  Resolved Hospital Problem list   N/A  Assessment & Plan:  Severe aortic valve stenosis s/p mechanical AVR 05/14/23 Postop vent management- resolved Postop ABLA expected- 1 unit 7/16 am Postop vasoplegia expected DM2 w/ hyperglycemia RLS HTN Chronic pain Class 3 obesity  - Encourage IS - Levemir/SSI - Line management and GDMT per TCTS - Advance diet - Lasix x 1 - Midodrine as ordered - Progressive  mobility  Myrla Halsted MD PCCM

## 2023-05-17 ENCOUNTER — Telehealth: Payer: Self-pay

## 2023-05-17 DIAGNOSIS — I350 Nonrheumatic aortic (valve) stenosis: Secondary | ICD-10-CM | POA: Diagnosis not present

## 2023-05-17 LAB — BASIC METABOLIC PANEL
Anion gap: 5 (ref 5–15)
BUN: 17 mg/dL (ref 8–23)
CO2: 24 mmol/L (ref 22–32)
Calcium: 8.4 mg/dL — ABNORMAL LOW (ref 8.9–10.3)
Chloride: 103 mmol/L (ref 98–111)
Creatinine, Ser: 0.8 mg/dL (ref 0.44–1.00)
GFR, Estimated: >60 mL/min (ref >60)
Glucose, Bld: 166 mg/dL — ABNORMAL HIGH (ref 70–99)
Potassium: 3.9 mmol/L (ref 3.5–5.1)
Sodium: 132 mmol/L — ABNORMAL LOW (ref 135–145)

## 2023-05-17 LAB — BPAM RBC
Blood Product Expiration Date: 202407212359
Blood Product Expiration Date: 202407302359
Blood Product Expiration Date: 202408092359
ISSUE DATE / TIME: 202407152100
ISSUE DATE / TIME: 202407161748
ISSUE DATE / TIME: 202407170922
Unit Type and Rh: 600
Unit Type and Rh: 600
Unit Type and Rh: 600

## 2023-05-17 LAB — MAGNESIUM: Magnesium: 2 mg/dL (ref 1.7–2.4)

## 2023-05-17 LAB — TYPE AND SCREEN
ABO/RH(D): A NEG
Antibody Screen: NEGATIVE
Unit division: 0
Unit division: 0
Unit division: 0

## 2023-05-17 LAB — GLUCOSE, CAPILLARY
Glucose-Capillary: 142 mg/dL — ABNORMAL HIGH (ref 70–99)
Glucose-Capillary: 191 mg/dL — ABNORMAL HIGH (ref 70–99)
Glucose-Capillary: 117 mg/dL — ABNORMAL HIGH (ref 70–99)
Glucose-Capillary: 138 mg/dL — ABNORMAL HIGH (ref 70–99)

## 2023-05-17 LAB — CBC
HCT: 27.8 % — ABNORMAL LOW (ref 36.0–46.0)
Hemoglobin: 9.2 g/dL — ABNORMAL LOW (ref 12.0–15.0)
MCH: 30.5 pg (ref 26.0–34.0)
MCHC: 33.1 g/dL (ref 30.0–36.0)
MCV: 92.1 fL (ref 80.0–100.0)
Platelets: 143 10*3/uL — ABNORMAL LOW (ref 150–400)
RBC: 3.02 MIL/uL — ABNORMAL LOW (ref 3.87–5.11)
RDW: 14.9 % (ref 11.5–15.5)
WBC: 10.2 10*3/uL (ref 4.0–10.5)
nRBC: 0 % (ref 0.0–0.2)

## 2023-05-17 LAB — ECHO INTRAOPERATIVE TEE
AV Mean grad: 66 mmHg
AV Peak grad: 94.9 mmHg
Ao pk vel: 4.87 m/s
Height: 66 in
Weight: 2832.01 oz

## 2023-05-17 MED ORDER — SODIUM CHLORIDE 0.9 % IV SOLN: 250.0000 mL | INTRAVENOUS | Status: DC | PRN

## 2023-05-17 MED ORDER — SODIUM CHLORIDE 0.9% FLUSH
3.0000 mL | Freq: Two times a day (BID) | INTRAVENOUS | Status: DC
  Administered 2023-05-17: 3 mL via INTRAVENOUS

## 2023-05-17 MED ORDER — SODIUM CHLORIDE 0.9% FLUSH: 3.0000 mL | INTRAVENOUS | Status: DC | PRN

## 2023-05-17 MED ORDER — FUROSEMIDE 40 MG PO TABS
40.0000 mg | ORAL_TABLET | Freq: Every day | ORAL | Status: DC
  Administered 2023-05-17 – 2023-05-21 (×5): 40 mg via ORAL
  Filled 2023-05-17 (×5): qty 1

## 2023-05-17 MED ORDER — METOPROLOL TARTRATE 12.5 MG HALF TABLET
12.5000 mg | ORAL_TABLET | Freq: Two times a day (BID) | ORAL | Status: DC
  Administered 2023-05-17: 12.5 mg via ORAL
  Filled 2023-05-17 (×4): qty 1

## 2023-05-17 MED ORDER — OXYCODONE HCL 5 MG PO TABS
5.0000 mg | ORAL_TABLET | Freq: Four times a day (QID) | ORAL | Status: DC | PRN
  Administered 2023-05-17: 10 mg via ORAL
  Administered 2023-05-17: 5 mg via ORAL
  Administered 2023-05-19 – 2023-05-21 (×4): 10 mg via ORAL
  Filled 2023-05-17 (×2): qty 2
  Filled 2023-05-17: qty 1
  Filled 2023-05-17 (×6): qty 2

## 2023-05-17 MED ORDER — ~~LOC~~ CARDIAC SURGERY, PATIENT & FAMILY EDUCATION: Freq: Once | Status: AC

## 2023-05-17 NOTE — Anesthesia Postprocedure Evaluation (Signed)
Anesthesia Post Note  Patient: Stacy Frost  Procedure(s) Performed: AORTIC VALVE REPLACEMENT (AVR) (Chest) TRANSESOPHAGEAL ECHOCARDIOGRAM     Patient location during evaluation: SICU Anesthesia Type: General Level of consciousness: sedated Pain management: pain level controlled Vital Signs Assessment: post-procedure vital signs reviewed and stable Respiratory status: patient remains intubated per anesthesia plan Cardiovascular status: stable Postop Assessment: no apparent nausea or vomiting Anesthetic complications: no   No notable events documented.  Last Vitals:  Vitals:   05/17/23 0900 05/17/23 1000  BP: 114/62 127/65  Pulse: (!) 106 (!) 104  Resp: (!) 21 18  Temp:    SpO2: 97% 96%    Last Pain:  Vitals:   05/17/23 0800  TempSrc:   PainSc: 7                  Prisha Hiley S

## 2023-05-17 NOTE — Progress Notes (Signed)
   NAME:  Stacy Frost, MRN:  [id number], DOB:  05-19-1961, LOS: 6 ADMISSION DATE:  05/11/2023, CONSULTATION DATE:  05/14/23 REFERRING MD:  Cliffton Asters, CHIEF COMPLAINT:  Syncope   History of Present Illness:  62 year old woman who presented to [facility] clinic with progressive DOE, presyncopal symptoms found to have severe aortic stenosis with bicuspid aortic valve.  Admitted 7/12 for urgent AVR eval.  Underwent AVR today with Dr. Cliffton Asters.  Pump time 113 min, crossclamp 92, EBL 500cc.  Arrives to unit sedated on vent. PCCM consulted for further management.  Pertinent  Medical History  DM HLD GERD HTN  Significant Hospital Events: Including procedures, antibiotic start and stop dates in addition to other pertinent events   7/12 admit 7/15 AVR  Interim History / Subjective:  No events. Some dizziness with walking late last night. A little tachy.  Objective   Blood pressure 119/60, pulse (!) 116, temperature 98.5 F (36.9 C), temperature source Axillary, resp. rate (!) 26, height 5\' 6"  (1.676 m), weight 94.1 kg, SpO2 94%.        Intake/Output Summary (Last 24 hours) at 05/17/2023 0839 Last data filed at 05/17/2023 5366 Gross per 24 hour  Intake 392.34 ml  Output 2015 ml  Net -1622.66 ml   Filed Weights   05/15/23 0535 05/16/23 0540 05/17/23 0500  Weight: 92.7 kg 94.3 kg 94.1 kg    Examination: No distress Sternum wrapped Lungs diminished bases AO x 3 Ext warm  BMP looks okay Plts improved on CBC Hgb improved after transfusion yesterday  Resolved Hospital Problem list   N/A  Assessment & Plan:  Severe aortic valve stenosis s/p mechanical AVR 05/14/23 Postop vent management- resolved Postop ABLA expected- 1 unit 7/16 am, 1 unit 7/17 Postop vasoplegia expected- started on midodrine DM2 w/ hyperglycemia RLS HTN Chronic pain Class 3 obesity  - Encourage IS - Levemir/SSI - Lasix as ordered - Midodrine and metoprolol as ordered - Encourage mobility  Myrla Halsted MD  PCCM

## 2023-05-17 NOTE — Telephone Encounter (Signed)
FMLA form completed and faxed to [phone] per pt's request Beginning LOA 05/11/23 through 08/13/23. Patient's son Sharia Reeve) will pick up form from front desk

## 2023-05-17 NOTE — Discharge Summary (Signed)
Physician Discharge Summary  Patient ID: Stacy Frost MRN: [id number] DOB/AGE: 1961-06-15 62 y.o.  Admit date: 05/11/2023 Discharge date: 05/21/2023  Admission Diagnoses:  Severe aortic stenosis Bicuspid aortic valve Dyslipidemia History of hypertension Dyslipidemia  Discharge Diagnoses:   Severe aortic stenosis Bicuspid aortic valve Dyslipidemia History of hypertension Post-operative hypotension Dyslipidemia S/P aortic valve replacement with bioprosthetic valve Expected acute blood loss anemia Thrombocytopenia Volume excess    Discharged Condition: stable  History of Present Illness:       Stacy Frost is a 62 yo female with history of diabetes mellitus (type 2), migraine headache, and bicuspid aortic valve.  She was referred to Dr. Cliffton Asters for evaluation for severe aortic stenosis and was seen in our office today.  Upon evaluation the patient admitted to suffering syncopal episodes at home while using the bathroom.  Due to this, it was felt patient required urgent admission and surgical intervention to decrease risk of sudden cardiac death.  Currently, the patient is sitting on the edge of the bed, awaiting nurse to obtain IV. On the monitor, SR with HR in the 80's. She denies chest pain or shortness of breath.  Hospital Course:  Ms. Messman remained stable following hospital admission.  She was taken to the operative room on 05/14/2023 where the aortic valve was replaced with a 25 mm [employer] Inspiris bovine pericardial tissue valve.  Following procedure, she separated from cardiopulmonary bypass without difficulty.  She was transferred to the ICU in stable condition.  Respiratory status and hemodynamics remained stable.  She was weaned from mechanical ventilation and extubated on the day of surgery.  Monitoring lines were removed.  She was mobilized. She had an expected acute blood loss anemia with Hct 22% on post op day 2 so was transfused with 1 unit PRBC's.   Diabetes mellitus was managed initially with an insulin drip and later transitioned to Levemir and SSI. She will be restarted on Metformin closer to discharge and [facility] after discharge. Her pre op HGA1C was 7.4. Persistent vasoplegia was managed with oral Midodrine. She was started on low-dose metoprolol, ASA, and a statin after surgery. She was diuresed routinely for expected volume excess. DVT prophylaxis was addressed with daily subQ enoxaparin and ambulation. She was ready for transfer to 4E [facility] on post-op day 3. Diuresis was continued. With Midodrine already started, Lopressor was titrated to 25 mg bid (sinus tachycardia) on 07/19.  She has been tolerating a diet. She was given a laxative to assist with bowel movement. Her sternal wound is clean, dry, healing without signs of infection. She had expiratory wheezing so Xopenex was given. She is ambulating with good oxygenation on room air.   Consults: pulmonary/intensive care  Significant Diagnostic Studies:   CLINICAL DATA:  Pleural effusion   EXAM: CHEST - 2 VIEW   COMPARISON:  05/16/2023   FINDINGS: Right IJ line has been removed.   Aortic valve prosthesis noted.   Low lung volumes are present, causing crowding of the pulmonary vasculature.   Small to moderate bilateral pleural effusions with passive atelectasis.   Mild enlargement of the cardiopericardial silhouette without overt edema.   Thoracic spondylosis.   IMPRESSION: 1. Small to moderate bilateral pleural effusions with passive atelectasis. 2. Mild enlargement of the cardiopericardial silhouette without overt edema. 3. Aortic valve prosthesis.     Electronically Signed   By: Gaylyn Rong M.D.   On: 05/18/2023 12:08  Treatments:  Surgery 05/14/2023 Patient:  Stacy Frost Pre-Op Dx: Severe Aortic Stenosis  Post-op Dx:  same Procedure: Aortic valve replacement with a 25mm Inspiris Valve       Surgeon and Role:      * Lightfoot,  Eliezer Lofts, MD - Primary    * M. Zykeria Laguardia - PA-C     Anesthesia  general EBL:  Blood Administration: none Xclamp Time:  92 min Pump Time:    Drains: 11 F blake drain: mediastinal  X 2 Wires: ventricular Counts: correct     Indications: 62yo female with severe AS. She is quite symptomatic, and requires transport in a wheelchair today. She has also had several pre-syncopal episodes with minimal activity of late. Her left heart cath is clean. I have admitted her to the hospital given her symptoms, and recent falls. We will plan on bAVR on 7/15    Findings: Bicuspid aortic valve with fusion of the right and left commissure.  Heavily calcified annulus.  Discharge Exam: Blood pressure (!) 150/69, pulse (!) 102, temperature 98.4 F (36.9 C), temperature source Oral, resp. rate 20, height 5\' 6"  (1.676 m), weight 89.5 kg, SpO2 96%.  Cardiovascular: Sinus rhythm / mild sinus tach, no murmur Pulmonary: breath sounds clear.  CXR showing better aeration, effusions improving.  Abdomen: Soft, non tender Extremities: well perfused, mild LE edema Wound: Clean and dry.  No erythema or signs of infection.  Disposition:  Discharged to home in stable condition    Discharge Instructions     Amb Referral to Cardiac Rehabilitation   Complete by: As directed    Refer to [facility] for CRPH2   Diagnosis: Valve Replacement   Valve: Aortic   After initial evaluation and assessments completed: Virtual Based Care may be provided alone or in conjunction with Phase 2 Cardiac Rehab based on patient barriers.: Yes   Intensive Cardiac Rehabilitation (ICR) [facility] location only OR Traditional Cardiac Rehabilitation (TCR) *If criteria for ICR are not met will enroll in TCR [facility] only): Yes      Allergies as of 05/21/2023       Reactions   Amlodipine Besylate Shortness Of Breath   Morphine And Codeine Shortness Of Breath, Other (See Comments)   Chest pain   Amitriptyline Other (See  Comments)   Unknown reaction   Butorphanol Tartrate Other (See Comments)   REACTION: ED visit and got for migraine - not sure of response   Compazine [prochlorperazine Edisylate] Other (See Comments)   Altered mental status   Rocephin [ceftriaxone Sodium In Dextrose]    Given at GYN for infection - nauseated, dizziness (w/in last 7-8 years)   Sumatriptan Other (See Comments)   REACTION: HTN        Medication List     STOP taking these medications    acetaminophen-codeine 300-30 MG tablet Commonly known as: TYLENOL #3   lisinopril 20 MG tablet Commonly known as: ZESTRIL   nitroGLYCERIN 0.4 MG SL tablet Commonly known as: NITROSTAT   simvastatin 40 MG tablet Commonly known as: ZOCOR       TAKE these medications    aspirin EC 325 MG tablet Take 1 tablet (325 mg total) by mouth daily. What changed:  medication strength how much to take   atorvastatin 80 MG tablet Commonly known as: LIPITOR Take 1 tablet (80 mg total) by mouth daily.   celecoxib 200 MG capsule Commonly known as: CELEBREX Take 200 mg by mouth 2 (two) times daily.   citalopram 40 MG tablet Commonly known as: CELEXA Take 40 mg by mouth daily.  clonazePAM 0.5 MG tablet Commonly known as: KLONOPIN Take 0.5 mg by mouth 2 (two) times daily as needed.   cyclobenzaprine 10 MG tablet Commonly known as: FLEXERIL Take 10 mg by mouth 3 (three) times daily.   Dexcom G7 Receiver Devi USE 1 receiver continuously   Dexcom G7 Sensor Misc APPLY 1 SENSOR EVERY 10 DAYS   ferrous sulfate 325 (65 FE) MG tablet Take 1 tablet (325 mg total) by mouth daily with breakfast.   furosemide 40 MG tablet Commonly known as: Lasix Take 1 tablet (40 mg total) by mouth daily. What changed: medication strength   metFORMIN 1000 MG tablet Commonly known as: GLUCOPHAGE Take 1 tablet (1,000 mg total) by mouth 2 (two) times daily. What changed: when to take this   metoprolol 200 MG 24 hr tablet Commonly known as:  TOPROL-XL Take 200 mg by mouth daily.   Mounjaro 7.5 MG/0.5ML Pen Generic drug: tirzepatide INJECT 7.5MG  UNDER THE SKIN ONCE A WEEK   oxyCODONE 5 MG immediate release tablet Commonly known as: Oxy IR/ROXICODONE Take 1 tablet (5 mg total) by mouth every 4 (four) hours as needed for up to 7 days for moderate pain.   potassium chloride SA 20 MEQ tablet Commonly known as: KLOR-CON M Take 1 tablet (20 mEq total) by mouth daily for 7 days.   pramipexole 0.5 MG tablet Commonly known as: MIRAPEX Take 0.5 mg by mouth at bedtime.   promethazine 25 MG tablet Commonly known as: PHENERGAN Take 25 mg by mouth 2 (two) times daily as needed.   traZODone 100 MG tablet Commonly known as: DESYREL Take 100 mg by mouth at bedtime.         Follow-up Information     Corliss Skains, MD Follow up on 05/25/2023.   Specialty: Cardiothoracic Surgery Why: This is a virtual visit (phone call) .  Do not go to the office. Dr. Cliffton Asters will call you around 2:20pm. Contact information: [address] [place] [address] [phone]         Sharlene Dory, NP. Go on 06/04/2023.   Specialty: Cardiology Why: Your appointment is at 10am. Contact information: [address] [place] [address] [phone]         [facility] Follow up.   Why: Iantha Fallen) T[facility] office referral for any [facility] needs Contact information: [address] [place] [address] [phone]         [facility]. Go on 06/26/2023.   Why: Appointment time is at 9:30 am Contact information: [facility] [address] [place], [place], [place] [address]                The patient has been discharged on:   1.Beta Blocker:  Yes [ x  ]                              No   [   ]                              If No, reason:  2.Ace Inhibitor/ARB: Yes [   ]                                     No  [  x  ]  If No, reason:  3.Statin:   Yes [  x  ]                  No  [   ]                  If No, reason:  4.Ecasa:  Yes  [  x ]                  No   [   ]                  If No, reason:  5. ACS on Admission?  P2Y12 Inhibitor:  Yes  [   ]                                No  [ x ]    Signed: Leary Roca, PA-C  05/21/2023, 8:06 AM

## 2023-05-17 NOTE — Progress Notes (Signed)
      [address]       [place] [address]             [phone]                 3 Days Post-Op Procedure(s) (LRB): AORTIC VALVE REPLACEMENT (AVR) (N/A) TRANSESOPHAGEAL ECHOCARDIOGRAM (N/A)   Events: No events  _______________________________________________________________ Vitals: BP 119/60   Pulse (!) 116   Temp 98.7 F (37.1 C) (Oral)   Resp (!) 26   Ht 5\' 6"  (1.676 m)   Wt 94.1 kg   SpO2 94%   BMI 33.48 kg/m  Filed Weights   05/15/23 0535 05/16/23 0540 05/17/23 0500  Weight: 92.7 kg 94.3 kg 94.1 kg     - Neuro: alert NAD  - Cardiovascular: sinus   Drips: none.      - Pulm: EWOB    ABG    Component Value Date/Time   PHART 7.351 05/14/2023 1833   PCO2ART 37.6 05/14/2023 1833   PO2ART 58 (L) 05/14/2023 1833   HCO3 20.7 05/14/2023 1833   TCO2 22 05/14/2023 1833   ACIDBASEDEF 4.0 (H) 05/14/2023 1833   O2SAT 88 05/14/2023 1833    - Abd: ND - Extremity: warm  .Intake/Output      07/17 0701 07/18 0700 07/18 0701 07/19 0700   I.V. (mL/kg) 36.3 (0.4)    Blood 356    IV Piggyback     Total Intake(mL/kg) 392.3 (4.2)    Urine (mL/kg/hr) 1975 (0.9)    Chest Tube 40    Total Output 2015    Net -1622.7            _______________________________________________________________ Labs:    Latest Ref Rng & Units 05/17/2023    4:11 AM 05/16/2023    5:51 PM 05/16/2023    4:39 AM  CBC  WBC 4.0 - 10.5 K/uL 10.2   8.4   Hemoglobin 12.0 - 15.0 g/dL 9.2  9.7  7.8   Hematocrit 36.0 - 46.0 % 27.8  29.2  23.3   Platelets 150 - 400 K/uL 143   114       Latest Ref Rng & Units 05/17/2023    4:11 AM 05/16/2023    4:39 AM 05/15/2023    4:32 PM  CMP  Glucose 70 - 99 mg/dL 914  782  956   BUN 8 - 23 mg/dL 17  24  23    Creatinine 0.44 - 1.00 mg/dL 2.13  0.86  5.78   Sodium 135 - 145 mmol/L 132  134  133   Potassium 3.5 - 5.1 mmol/L 3.9  3.9  4.7   Chloride 98 - 111 mmol/L 103  102  104   CO2 22 - 32 mmol/L 24  23  21    Calcium 8.9 - 10.3  mg/dL 8.4  8.4  8.0     CXR: stable  _______________________________________________________________  Assessment and Plan: POD 3 s/p bAVR  Neuro: pain controlled CV: restarting BB Pulm: IS, ambulation Renal: will diurese today GI: on diet Heme: stable.   ID: afebrile Endo: SSI Dispo: floor today   Stacy Frost O Stacy Frost 05/17/2023 8:05 AM

## 2023-05-17 NOTE — Discharge Instructions (Addendum)
Discharge Instructions:  1. You may shower, please wash incisions daily with soap and water and keep dry.  If you wish to cover wounds with dressing you may do so but please keep clean and change daily.  No tub baths or swimming until incisions have completely healed.  If your incisions become red or develop any drainage please call our office at [phone]  2. No Driving until cleared by Dr. Lightfoot's office and you are no longer using narcotic pain medications  3. Monitor your weight daily.. Please use the same scale and weigh at same time... If you gain 5-10 lbs in 48 hours with associated lower extremity swelling, please contact our office at [phone]  4. Fever of 101.5 for at least 24 hours with no source, please contact our office at [phone]  5. Activity- up as tolerated, please walk at least 3 times per day.  Avoid strenuous activity, no lifting, pushing, or pulling with your arms over 8-10 lbs for a minimum of 6 weeks  6. If any questions or concerns arise, please do not hesitate to contact our office at [phone] 

## 2023-05-17 NOTE — Evaluation (Signed)
Physical Therapy Evaluation Patient Details Name: Stacy Frost MRN: [id number] DOB: 10-12-1961 Today's Date: 05/17/2023  History of Present Illness  Patient is a 62 y/o female admitted 05/11/23 with severe aortic stenosis now s/p AVR 05/14/23.  PMH positive for DM, GERD, HTN, MVP, RLS, HLD and obesity.  Clinical Impression  Patient presents with decreased mobility due to pain in chest, decreased activity tolerance, decreased balance, decreased strength.  She previously mobilized independently with ADL's/IADL's.  Patient needing min to mod A for transfers and ambulation with Carley Hammed walker in hallway.  She tolerated distance of entire unit with VSS on 2L O2.  She will benefit from skilled PT in the acute setting with recommendations for HHPT at d/c.         Assistance Recommended at Discharge Intermittent Supervision/Assistance  If plan is discharge home, recommend the following:  Can travel by private vehicle  A little help with walking and/or transfers;A little help with bathing/dressing/bathroom;Assistance with cooking/housework;Direct supervision/assist for medications management;Assist for transportation;Help with stairs or ramp for entrance        Equipment Recommendations None recommended by PT  Recommendations for Other Services       Functional Status Assessment Patient has had a recent decline in their functional status and demonstrates the ability to make significant improvements in function in a reasonable and predictable amount of time.     Precautions / Restrictions Precautions Precautions: Fall;Sternal      Mobility  Bed Mobility Overal bed mobility: Needs Assistance             General bed mobility comments: up in recliner    Transfers Overall transfer level: Needs assistance Equipment used: None Transfers: Sit to/from Stand, Bed to chair/wheelchair/BSC Sit to Stand: Min assist, +2 physical assistance   Step pivot transfers: Mod assist, +2 physical  assistance       General transfer comment: up with use of momentum and assist for balance pt using heart pillow to secure chest, spouse also assisting with up to [facility] with help for balance while stepping pt with posterior bias and lowered for safety to [facility]    Ambulation/Gait Ambulation/Gait assistance: Min assist Gait Distance (Feet): 400 Feet Assistive device: Fara Boros Gait Pattern/deviations: Step-through pattern, Decreased stride length, Wide base of support, Drifts right/left       General Gait Details: assist to control Eva walker and for balance  Stairs            Wheelchair Mobility     Tilt Bed    Modified Rankin (Stroke Patients Only)       Balance Overall balance assessment: Needs assistance Sitting-balance support: Feet supported Sitting balance-Leahy Scale: Good   Postural control: Posterior lean Standing balance support: Bilateral upper extremity supported Standing balance-Leahy Scale: Poor Standing balance comment: mild posterior bias esp during transitions, needs support for safety & balance                             Pertinent Vitals/Pain Pain Assessment Pain Assessment: 0-10 Pain Score: 8  Pain Location: chest Pain Descriptors / Indicators: Aching, Discomfort Pain Intervention(s): Monitored during session, RN gave pain meds during session    Home Living Family/patient expects to be discharged to:: Private residence Living Arrangements: Spouse/significant other;Children (disabled daughter) Available Help at Discharge: Family Type of Home: House Home Access: Stairs to enter   Secretary/administrator of Steps: 1&1   Home Layout: One level Home Equipment: Agricultural consultant (2 wheels);Shower  seat;BSC/3in1      Prior Function                       Hand Dominance        Extremity/Trunk Assessment   Upper Extremity Assessment Upper Extremity Assessment: Generalized weakness (limited by sternal precautions)     Lower Extremity Assessment Lower Extremity Assessment: Generalized weakness       Communication   Communication: No difficulties  Cognition Arousal/Alertness: Awake/alert Behavior During Therapy: WFL for tasks assessed/performed Overall Cognitive Status: Within Functional Limits for tasks assessed                                 General Comments: but not formally assessed        General Comments General comments (skin integrity, edema, etc.): Spouse present and supportive. VSS on 2L O2 throughout.  Toileted prior to mobility    Exercises     Assessment/Plan    PT Assessment Patient needs continued PT services  PT Problem List Decreased strength;Decreased balance;Pain;Decreased mobility;Decreased activity tolerance;Cardiopulmonary status limiting activity;Decreased knowledge of use of DME       PT Treatment Interventions DME instruction;Functional mobility training;Balance training;Patient/family education;Therapeutic activities;Gait training;Therapeutic exercise;Stair training    PT Goals (Current goals can be found in the Care Plan section)  Acute Rehab PT Goals Patient Stated Goal: return to independent PT Goal Formulation: With patient/family Time For Goal Achievement: 05/31/23    Frequency Min 1X/week     Co-evaluation               AM-PAC PT "6 Clicks" Mobility  Outcome Measure Help needed turning from your back to your side while in a flat bed without using bedrails?: A Lot Help needed moving from lying on your back to sitting on the side of a flat bed without using bedrails?: A Lot Help needed moving to and from a bed to a chair (including a wheelchair)?: A Lot Help needed standing up from a chair using your arms (e.g., wheelchair or bedside chair)?: A Lot Help needed to walk in hospital room?: A Lot Help needed climbing 3-5 steps with a railing? : Total 6 Click Score: 11    End of Session Equipment Utilized During Treatment: Gait  belt;Oxygen Activity Tolerance: Patient tolerated treatment well Patient left: in chair;with call bell/phone within reach;with family/visitor present   PT Visit Diagnosis: Other abnormalities of gait and mobility (R26.89);Muscle weakness (generalized) (M62.81)    Time: 0981-1914 PT Time Calculation (min) (ACUTE ONLY): 28 min   Charges:   PT Evaluation $PT Eval Moderate Complexity: 1 Mod PT Treatments $Gait Training: 8-22 mins PT General Charges $$ ACUTE PT VISIT: 1 Visit         Sheran Lawless, PT Acute Rehabilitation Services Office:[phone] 05/17/2023   Elray Mcgregor 05/17/2023, 5:13 PM

## 2023-05-17 NOTE — Progress Notes (Signed)
Pt admitted to rm 7 from [facility]. Initiated tele. Oriented pt to the unit. VSS. Call bell within reach.   Lawson Radar, RN

## 2023-05-18 ENCOUNTER — Ambulatory Visit: Payer: 59 | Admitting: Nurse Practitioner

## 2023-05-18 ENCOUNTER — Inpatient Hospital Stay (HOSPITAL_COMMUNITY): Payer: 59

## 2023-05-18 LAB — CBC
HCT: 26.7 % — ABNORMAL LOW (ref 36.0–46.0)
Hemoglobin: 8.8 g/dL — ABNORMAL LOW (ref 12.0–15.0)
MCH: 29.7 pg (ref 26.0–34.0)
MCHC: 33 g/dL (ref 30.0–36.0)
MCV: 90.2 fL (ref 80.0–100.0)
Platelets: 174 10*3/uL (ref 150–400)
RBC: 2.96 MIL/uL — ABNORMAL LOW (ref 3.87–5.11)
RDW: 14.9 % (ref 11.5–15.5)
WBC: 7.9 10*3/uL (ref 4.0–10.5)
nRBC: 0 % (ref 0.0–0.2)

## 2023-05-18 LAB — BASIC METABOLIC PANEL
Anion gap: 9 (ref 5–15)
BUN: 14 mg/dL (ref 8–23)
CO2: 24 mmol/L (ref 22–32)
Calcium: 8.5 mg/dL — ABNORMAL LOW (ref 8.9–10.3)
Chloride: 100 mmol/L (ref 98–111)
Creatinine, Ser: 0.8 mg/dL (ref 0.44–1.00)
GFR, Estimated: >60 mL/min (ref >60)
Glucose, Bld: 137 mg/dL — ABNORMAL HIGH (ref 70–99)
Potassium: 3.7 mmol/L (ref 3.5–5.1)
Sodium: 133 mmol/L — ABNORMAL LOW (ref 135–145)

## 2023-05-18 LAB — GLUCOSE, CAPILLARY
Glucose-Capillary: 138 mg/dL — ABNORMAL HIGH (ref 70–99)
Glucose-Capillary: 153 mg/dL — ABNORMAL HIGH (ref 70–99)
Glucose-Capillary: 145 mg/dL — ABNORMAL HIGH (ref 70–99)
Glucose-Capillary: 99 mg/dL (ref 70–99)

## 2023-05-18 MED ORDER — ATORVASTATIN CALCIUM 80 MG PO TABS
80.0000 mg | ORAL_TABLET | Freq: Every day | ORAL | Status: DC
  Administered 2023-05-18 – 2023-05-21 (×4): 80 mg via ORAL
  Filled 2023-05-18 (×4): qty 1

## 2023-05-18 MED ORDER — LEVALBUTEROL HCL 0.63 MG/3ML IN NEBU
0.6300 mg | INHALATION_SOLUTION | RESPIRATORY_TRACT | Status: AC
  Administered 2023-05-18: 0.63 mg via RESPIRATORY_TRACT

## 2023-05-18 MED ORDER — FERROUS SULFATE 325 (65 FE) MG PO TABS
ORAL_TABLET | ORAL | Status: AC
  Filled 2023-05-18: qty 1

## 2023-05-18 MED ORDER — LEVALBUTEROL HCL 0.63 MG/3ML IN NEBU: 0.6300 mg | INHALATION_SOLUTION | Freq: Three times a day (TID) | RESPIRATORY_TRACT | Status: DC | PRN

## 2023-05-18 MED ORDER — LEVALBUTEROL HCL 0.63 MG/3ML IN NEBU
INHALATION_SOLUTION | RESPIRATORY_TRACT | Status: AC
  Filled 2023-05-18: qty 3

## 2023-05-18 MED ORDER — LACTULOSE 10 GM/15ML PO SOLN
ORAL | Status: AC
  Filled 2023-05-18: qty 15

## 2023-05-18 MED ORDER — MIDODRINE HCL 5 MG PO TABS
5.0000 mg | ORAL_TABLET | Freq: Three times a day (TID) | ORAL | Status: DC
  Administered 2023-05-18: 5 mg via ORAL
  Filled 2023-05-18: qty 1

## 2023-05-18 MED ORDER — POTASSIUM CHLORIDE CRYS ER 20 MEQ PO TBCR
30.0000 meq | EXTENDED_RELEASE_TABLET | Freq: Two times a day (BID) | ORAL | Status: DC
  Administered 2023-05-18 – 2023-05-21 (×7): 30 meq via ORAL
  Filled 2023-05-18 (×6): qty 1

## 2023-05-18 MED ORDER — METOPROLOL TARTRATE 25 MG PO TABS
25.0000 mg | ORAL_TABLET | Freq: Two times a day (BID) | ORAL | Status: DC
  Administered 2023-05-18 – 2023-05-21 (×7): 25 mg via ORAL
  Filled 2023-05-18 (×6): qty 1

## 2023-05-18 MED ORDER — LACTULOSE 10 GM/15ML PO SOLN
20.0000 g | Freq: Once | ORAL | Status: AC
  Administered 2023-05-18: 20 g via ORAL

## 2023-05-18 MED ORDER — FERROUS SULFATE 325 (65 FE) MG PO TABS
325.0000 mg | ORAL_TABLET | Freq: Every day | ORAL | Status: DC
  Administered 2023-05-18 – 2023-05-21 (×4): 325 mg via ORAL
  Filled 2023-05-18 (×4): qty 1

## 2023-05-18 MED ORDER — POTASSIUM CHLORIDE CRYS ER 10 MEQ PO TBCR
EXTENDED_RELEASE_TABLET | ORAL | Status: AC
  Filled 2023-05-18: qty 1

## 2023-05-18 MED ORDER — POTASSIUM CHLORIDE CRYS ER 20 MEQ PO TBCR: 20.0000 meq | EXTENDED_RELEASE_TABLET | Freq: Every day | ORAL | Status: DC

## 2023-05-18 NOTE — Progress Notes (Signed)
Mobility Specialist Progress Note:   05/18/23 1557  Mobility  Activity Ambulated with assistance in hallway  Level of Assistance Minimal assist, patient does 75% or more  Assistive Device Front wheel walker  Distance Ambulated (ft) 130 ft  RUE Weight Bearing NWB  LUE Weight Bearing NWB  Activity Response Tolerated well  Mobility Referral Yes  $Mobility charge 1 Mobility  Mobility Specialist Start Time (ACUTE ONLY) 1425  Mobility Specialist Stop Time (ACUTE ONLY) 1442  Mobility Specialist Time Calculation (min) (ACUTE ONLY) 17 min    Post Mobility: 101 HR ,  94% SpO2 2 L  Pt received on BSC with NT assisting pericare, agreeable to mobility. Ambulated on 2 L with chair follow. Pt c/o leg weakness and slight SOB during ambulation but rest break was not needed. Pt left in bed with call bell near and VSS. Family present.   Leory Plowman  Mobility Specialist Please contact via [employer] office at [phone]

## 2023-05-18 NOTE — Progress Notes (Signed)
Pt ambulated x 200 feet in hall with front wheel walker on room air pt tolerated well.

## 2023-05-18 NOTE — Progress Notes (Signed)
Physical Therapy Treatment Patient Details Name: Stacy Frost MRN: [id number] DOB: 1961-03-15 Today's Date: 05/18/2023   History of Present Illness Patient is a 62 y/o female admitted 05/11/23 with severe aortic stenosis now s/p AVR 05/14/23.  PMH positive for DM, GERD, HTN, MVP, RLS, HLD and obesity.    PT Comments  Pt received sitting in the chair and agreeable to session with family present. Pt reporting increased fatigue from recent hygiene tasks performed, but is motivated to improve mobility. Pt demonstrating improved ability to stand with hands placed on knees and cues for anterior lean. Pt able to tolerate short hallway ambulation distance and is limited by increased DOE and wheezing noted. Pt demonstrating increased NBOS during gait with slight unsteadiness, but no physical assist needed. Pt continues to benefit from PT services to progress toward functional mobility goals.     Assistance Recommended at Discharge Intermittent Supervision/Assistance  If plan is discharge home, recommend the following:  Can travel by private vehicle    A little help with walking and/or transfers;A little help with bathing/dressing/bathroom;Assistance with cooking/housework;Direct supervision/assist for medications management;Assist for transportation;Help with stairs or ramp for entrance      Equipment Recommendations  None recommended by PT    Recommendations for Other Services       Precautions / Restrictions Precautions Precautions: Fall;Sternal Restrictions Weight Bearing Restrictions: Yes Other Position/Activity Restrictions: sternal     Mobility  Bed Mobility               General bed mobility comments: Pt beginning and ending session in recliner    Transfers Overall transfer level: Needs assistance Equipment used: None Transfers: Sit to/from Stand Sit to Stand: Min guard           General transfer comment: Pt using momentum and placed hands on knees. Pt with two  unsuccessful attempts, but is able to demonstrate improved power up with cues for increased anterior lean. min guard for safety and intermittent min A for balance when initially standing    Ambulation/Gait Ambulation/Gait assistance: Min guard Gait Distance (Feet): 60 Feet (+5) Assistive device: Rolling walker (2 wheels) Gait Pattern/deviations: Step-through pattern, Decreased stride length, Drifts right/left, Narrow base of support, Trunk flexed       General Gait Details: Pt demonstrating slow step-through pattern with narrow BOS and decreased foot clearance with increased distance. Cues for upright posture.       Balance Overall balance assessment: Needs assistance Sitting-balance support: Feet supported Sitting balance-Leahy Scale: Good     Standing balance support: Bilateral upper extremity supported, Reliant on assistive device for balance, During functional activity Standing balance-Leahy Scale: Poor Standing balance comment: with RW support. min A when initially standing due to slight posterior bias                            Cognition Arousal/Alertness: Awake/alert Behavior During Therapy: WFL for tasks assessed/performed Overall Cognitive Status: Within Functional Limits for tasks assessed                                          Exercises      General Comments General comments (skin integrity, edema, etc.): Family present and supportive throughout. VSS on 2L throughout. Pt demonstrating increased wheezing with family reporting this began yesterday and is worse today.      Pertinent Vitals/Pain Pain Assessment  Pain Assessment: 0-10 Pain Score: 5  Pain Location: chest Pain Descriptors / Indicators: Aching, Discomfort Pain Intervention(s): Monitored during session, Limited activity within patient's tolerance, Repositioned     PT Goals (current goals can now be found in the care plan section) Acute Rehab PT Goals Patient Stated  Goal: return to independent PT Goal Formulation: With patient/family Time For Goal Achievement: 05/31/23 Progress towards PT goals: Progressing toward goals    Frequency    Min 1X/week      PT Plan Current plan remains appropriate       AM-PAC PT "6 Clicks" Mobility   Outcome Measure  Help needed turning from your back to your side while in a flat bed without using bedrails?: A Lot Help needed moving from lying on your back to sitting on the side of a flat bed without using bedrails?: A Lot Help needed moving to and from a bed to a chair (including a wheelchair)?: A Little Help needed standing up from a chair using your arms (e.g., wheelchair or bedside chair)?: A Little Help needed to walk in hospital room?: A Little Help needed climbing 3-5 steps with a railing? : Total 6 Click Score: 14    End of Session Equipment Utilized During Treatment: Gait belt;Oxygen Activity Tolerance: Patient tolerated treatment well Patient left: in chair;with call bell/phone within reach;with family/visitor present Nurse Communication: Mobility status PT Visit Diagnosis: Other abnormalities of gait and mobility (R26.89);Muscle weakness (generalized) (M62.81)     Time: 1022-1050 PT Time Calculation (min) (ACUTE ONLY): 28 min  Charges:    $Gait Training: 8-22 mins $Therapeutic Activity: 8-22 mins PT General Charges $$ ACUTE PT VISIT: 1 Visit                     Johny Shock, PTA Acute Rehabilitation Services Secure Chat Preferred  [phone]    Johny Shock 05/18/2023, 11:56 AM

## 2023-05-18 NOTE — Progress Notes (Addendum)
      [address]       [name] [address]             [phone]        4 Days Post-Op Procedure(s) (LRB): AORTIC VALVE REPLACEMENT (AVR) (N/A) TRANSESOPHAGEAL ECHOCARDIOGRAM (N/A)  Subjective: Patient 's daughter in law at bedside. She has not had a bowel movement yet.  Objective: Vital signs in last 24 hours: Temp:  [97.6 F (36.4 C)-98.5 F (36.9 C)] 98.2 F (36.8 C) (07/18 2002) Pulse Rate:  [104-116] 109 (07/18 2002) Cardiac Rhythm: Normal sinus rhythm (07/19 0021) Resp:  [17-27] 20 (07/18 2002) BP: (107-139)/(60-99) 128/66 (07/18 2002) SpO2:  [93 %-100 %] 94 % (07/18 2002)  Pre op weight 80.3 kg Current Weight  05/17/23 94.1 kg      Intake/Output from previous day: 07/18 0701 - 07/19 0700 In: -  Out: 650 [Urine:650]   Physical Exam:  Cardiovascular: Tachycardic, no murmur Pulmonary: Expiratory wheezing Abdomen: Soft, non tender, bowel sounds present. Extremities: SCDs in place Wound: Clean and dry.  No erythema or signs of infection.  Lab Results: CBC: Recent Labs    05/17/23 0411 05/18/23 0013  WBC 10.2 7.9  HGB 9.2* 8.8*  HCT 27.8* 26.7*  PLT 143* 174   BMET:  Recent Labs    05/16/23 0439 05/17/23 0411  NA 134* 132*  K 3.9 3.9  CL 102 103  CO2 23 24  GLUCOSE 196* 166*  BUN 24* 17  CREATININE 1.07* 0.80  CALCIUM 8.4* 8.4*    PT/INR:  Lab Results  Component Value Date   INR 1.6 (H) 05/14/2023   INR 1.1 05/14/2023   ABG:  INR: Will add last result for INR, ABG once components are confirmed Will add last 4 CBG results once components are confirmed  Assessment/Plan:  1. CV - ST with HR in the low 100's. On Lopressor 12.5 mg bid and Midodrine 10 mg tid. . Will increase Lopressor for better HR/BP control. 2.  Pulmonary - On room air. Xopenx for wheezing. Will order PA/lAT CXR this am. Encourage incentive spirometer. 3. Volume Overload - On Lasix 40 mg daily. 4.  Expected post op acute blood loss anemia - H  and H this am slightly decreased to 8.8 and 26.7. Will start oral iron. 5. CBGs 117/138/138. Pre op HGA1C 7.4. On Insulin. Metformin will be restarted if BMET results stable this am. 6. Thrombocytopenia resolved-platelets this am up to 174,000 7. LOC constipation 8. Hopefully, home this weekend  Gwendolyn Nishi M ZimmermanPA-C 7:12 AM

## 2023-05-18 NOTE — Progress Notes (Signed)
Pt walked with PT earlier, reports some dizziness (orthostatics?) with exertion. Will f/u if able.

## 2023-05-19 LAB — GLUCOSE, CAPILLARY
Glucose-Capillary: 101 mg/dL — ABNORMAL HIGH (ref 70–99)
Glucose-Capillary: 178 mg/dL — ABNORMAL HIGH (ref 70–99)
Glucose-Capillary: 92 mg/dL (ref 70–99)
Glucose-Capillary: 114 mg/dL — ABNORMAL HIGH (ref 70–99)

## 2023-05-19 MED ORDER — METOLAZONE 5 MG PO TABS
2.5000 mg | ORAL_TABLET | Freq: Once | ORAL | Status: AC
  Administered 2023-05-19: 2.5 mg via ORAL
  Filled 2023-05-19: qty 1

## 2023-05-19 MED ORDER — POLYETHYLENE GLYCOL 3350 17 G PO PACK
17.0000 g | PACK | Freq: Every day | ORAL | Status: DC
  Administered 2023-05-19 – 2023-05-21 (×3): 17 g via ORAL
  Filled 2023-05-19 (×3): qty 1

## 2023-05-19 MED ORDER — CYCLOBENZAPRINE HCL 10 MG PO TABS
10.0000 mg | ORAL_TABLET | Freq: Three times a day (TID) | ORAL | Status: DC | PRN
  Administered 2023-05-19: 10 mg via ORAL
  Filled 2023-05-19: qty 1

## 2023-05-19 NOTE — Progress Notes (Signed)
Pt ambulated x 330 feet with front wheel walker

## 2023-05-19 NOTE — Progress Notes (Signed)
      [address]       [name] [address]             [phone]        5 Days Post-Op Procedure(s) (LRB): AORTIC VALVE REPLACEMENT (AVR) (N/A) TRANSESOPHAGEAL ECHOCARDIOGRAM (N/A)  Subjective:  Husband at the bedside today.  No new concerns.  Having some shortness of breath with activity.  BM x 2 yesterday.   Objective: Vital signs in last 24 hours: Temp:  [98.1 F (36.7 C)-99.2 F (37.3 C)] 98.3 F (36.8 C) (07/20 0713) Pulse Rate:  [90-104] 99 (07/20 0713) Cardiac Rhythm: Normal sinus rhythm (07/20 0737) Resp:  [18-22] 22 (07/20 0713) BP: (89-167)/(51-86) 132/79 (07/20 0713) SpO2:  [93 %-99 %] 93 % (07/20 0713) Weight:  [91.6 kg] 91.6 kg (07/20 0510)  Pre op weight 80.3 kg Current Weight  05/19/23 91.6 kg      Intake/Output from previous day: 07/19 0701 - 07/20 0700 In: 120 [P.O.:120] Out: -    Physical Exam:  Cardiovascular: Sinus tach, no murmur Pulmonary: Expiratory wheezing Abdomen: Soft, non tender, bowel sounds present. Extremities: well perfused, mild LE edema Wound: Clean and dry.  No erythema or signs of infection.  Lab Results: CBC: Recent Labs    05/17/23 0411 05/18/23 0013  WBC 10.2 7.9  HGB 9.2* 8.8*  HCT 27.8* 26.7*  PLT 143* 174   BMET:  Recent Labs    05/17/23 0411 05/18/23 0013  NA 132* 133*  K 3.9 3.7  CL 103 100  CO2 24 24  GLUCOSE 166* 137*  BUN 17 14  CREATININE 0.80 0.80  CALCIUM 8.4* 8.5*    PT/INR:  Lab Results  Component Value Date   INR 1.6 (H) 05/14/2023   INR 1.1 05/14/2023   ABG:  INR: Will add last result for INR, ABG once components are confirmed Will add last 4 CBG results once components are confirmed  Assessment/Plan:  1. CV - ST with HR in the low 100's. On Lopressor 25 mg bid  . BP has recovered to 130's- 150's.  Will stop the Midodrine today 2.  Pulmonary - On room air. Xopenx for wheezing. CXR yesterday showed small to moderate bilateral effusions and LL ATX.    Encourage incentive spirometer and flutter valve. 3. Volume Overload - Wt is about 10kg + if accurate. Will continue Lasix 40mg  daily and add Zaroxolyn x 1 dose this morning.  4.  Expected post op acute blood loss anemia - H and H stable, on oral iron supplement. 5. CBGs controlled. Pre op HGA1C 7.4. On Insulin. Metformin restarted  6. Thrombocytopenia resolved-platelets this am up to 174,000 7. Disposition- planning for eventual discharge to home with her family.  She needs more work on mobility (not yet independent) and more diuresis.    Cecille Amsterdam RoddenberryPA-C 8:15 AM

## 2023-05-19 NOTE — Progress Notes (Signed)
Mobility Specialist Progress Note    05/19/23 1251  Mobility  Activity Ambulated with assistance in hallway  Level of Assistance Minimal assist, patient does 75% or more  Assistive Device Front wheel walker  Distance Ambulated (ft) 350 ft  Activity Response Tolerated well  Mobility Referral Yes  $Mobility charge 1 Mobility  Mobility Specialist Start Time (ACUTE ONLY) 1230  Mobility Specialist Stop Time (ACUTE ONLY) 1250  Mobility Specialist Time Calculation (min) (ACUTE ONLY) 20 min   Pre-Mobility: 96 HR During Mobility: 114 HR Post-Mobility: 105 HR  Pt received in chair and agreeable. Took x2 standing rest breaks. Pt did have x1 LOB requiring minA to recover. Pt stated she felt drunk still. Returned to bathroom to void. RN aware.   [place] Mobility Specialist  Please Neurosurgeon or Rehab Office at [phone]

## 2023-05-19 NOTE — Progress Notes (Signed)
CARDIAC REHAB PHASE I   PRE:  Rate/Rhythm: 102 ST  BP:  Sitting: 125/72      SaO2: 94 RA  MODE:  Ambulation: 420 ft   POST:  Rate/Rhythm: 122 ST  BP:  Sitting: 158/76      SaO2: 98 RA  Pt tolerated exercise well and amb 420 ft with 2 wheeled walker, and stand-by assist. Pt denies CP, SOB, or dizziness throughout walk. Pt did take 2 standing rest breaks without dizziness or SOB. Pt felt somewhat fatigued. Pt went to bathroom after walk and then to recliner without walker. Ed given to pt and family. Discussed heart healthy diet, sternal precautions, IS use, wound care, and exercise guidelines. Will refer to [facility]. Pt left in the bed w/ call bell in reach. Discussed family care upon discharge w/ pt. Pt has spouse and son. Will continue to follow  [id number] Joya San, MS, ACSM-CEP 05/19/2023 9:02 AM

## 2023-05-20 LAB — GLUCOSE, CAPILLARY
Glucose-Capillary: 138 mg/dL — ABNORMAL HIGH (ref 70–99)
Glucose-Capillary: 135 mg/dL — ABNORMAL HIGH (ref 70–99)
Glucose-Capillary: 208 mg/dL — ABNORMAL HIGH (ref 70–99)
Glucose-Capillary: 210 mg/dL — ABNORMAL HIGH (ref 70–99)

## 2023-05-20 LAB — BASIC METABOLIC PANEL
Anion gap: 9 (ref 5–15)
BUN: 12 mg/dL (ref 8–23)
CO2: 26 mmol/L (ref 22–32)
Calcium: 8.9 mg/dL (ref 8.9–10.3)
Chloride: 100 mmol/L (ref 98–111)
Creatinine, Ser: 0.82 mg/dL (ref 0.44–1.00)
GFR, Estimated: >60 mL/min (ref >60)
Glucose, Bld: 168 mg/dL — ABNORMAL HIGH (ref 70–99)
Potassium: 4.1 mmol/L (ref 3.5–5.1)
Sodium: 135 mmol/L (ref 135–145)

## 2023-05-20 MED ORDER — METOLAZONE 5 MG PO TABS
2.5000 mg | ORAL_TABLET | Freq: Once | ORAL | Status: AC
  Administered 2023-05-20: 2.5 mg via ORAL
  Filled 2023-05-20: qty 1

## 2023-05-20 NOTE — Progress Notes (Signed)
Mobility Specialist Progress Note    05/20/23 1316  Mobility  Activity Ambulated with assistance in hallway  Level of Assistance Contact guard assist, steadying assist  Assistive Device Front wheel walker  Distance Ambulated (ft) 350 ft  Activity Response Tolerated well  Mobility Referral Yes  $Mobility charge 1 Mobility  Mobility Specialist Start Time (ACUTE ONLY) 1257  Mobility Specialist Stop Time (ACUTE ONLY) 1315  Mobility Specialist Time Calculation (min) (ACUTE ONLY) 18 min   Post-Mobility: 97 HR  Pt received in chair and agreeable. No complaints on walk. Took x2 short standing rest breaks. Returned to chair with call bell in reach.   [place] Mobility Specialist  Please Neurosurgeon or Rehab Office at [phone]

## 2023-05-20 NOTE — Progress Notes (Addendum)
      [address]       [name] [address]             [phone]        6 Days Post-Op Procedure(s) (LRB): AORTIC VALVE REPLACEMENT (AVR) (N/A) TRANSESOPHAGEAL ECHOCARDIOGRAM (N/A)  Subjective:  Husband at the bedside. Said she became short of breath with walking in the hall this morning but symptoms definitely better than prior to surgery.  Said she voided "a lot" yesterday but urine volumes not measured.  O2 sat 93-94% on RA at rest during visit today.  Objective: Vital signs in last 24 hours: Temp:  [98.2 F (36.8 C)-98.8 F (37.1 C)] 98.4 F (36.9 C) (07/21 0455) Pulse Rate:  [96-104] 103 (07/21 0455) Cardiac Rhythm: Sinus tachycardia (07/21 0732) Resp:  [15-20] 20 (07/21 0455) BP: (117-154)/(68-83) 154/76 (07/21 0455) SpO2:  [92 %-99 %] 94 % (07/21 0455) Weight:  [90.6 kg] 90.6 kg (07/21 0520)  Pre op weight 80.3 kg Current Weight  05/20/23 90.6 kg      Intake/Output from previous day: 07/20 0701 - 07/21 0700 In: 960 [P.O.:960] Out: -    Physical Exam:  Cardiovascular: Sinus rhythm / mild sinus tach, no murmur Pulmonary: breath sounds clear Abdomen: Soft, non tender Extremities: well perfused, mild LE edema Wound: Clean and dry.  No erythema or signs of infection.  Lab Results: CBC: Recent Labs    05/18/23 0013  WBC 7.9  HGB 8.8*  HCT 26.7*  PLT 174   BMET:  Recent Labs    05/18/23 0013 05/20/23 0129  NA 133* 135  K 3.7 4.1  CL 100 100  CO2 24 26  GLUCOSE 137* 168*  BUN 14 12  CREATININE 0.80 0.82  CALCIUM 8.5* 8.9    PT/INR:  Lab Results  Component Value Date   INR 1.6 (H) 05/14/2023   INR 1.1 05/14/2023   ABG:  INR: Will add last result for INR, ABG once components are confirmed Will add last 4 CBG results once components are confirmed  Assessment/Plan:  1. CV - SR / ST with HR in the low 100's. On Lopressor 25 mg bid  . BP remains in 130's- 150's off of the Midodrine. Will resume lisinopril  today. 2.  Pulmonary - On room air. Xopenx for wheezing. CXR 7/19 showed small to moderate bilateral effusions and LL ATX. Follow up CXR in AM.  She is using the incentive spirometer and flutter valve. 3. Volume Overload - Wt is still about 10kg + if accurate. Will continue Lasix 40mg  daily and repeat the Zaroxolyn x 1 dose this morning. F/U BMP in AM 4.  Expected post op acute blood loss anemia - H and H stable, on oral iron supplement. 5. CBGs controlled. Pre op HGA1C 7.4. On Insulin. Metformin restarted  6. Thrombocytopenia resolved 7. Disposition- planning for eventual discharge to home with her family.  She needs more work on mobility (not yet independent), lung work, and more diuresis.    Cecille Amsterdam RoddenberryPA-C 8:08 AM Agree with above

## 2023-05-20 NOTE — Progress Notes (Signed)
Pt ambulated x 470 feet with front  wheel walker, pt took 1 standing rest brake, tolerated well

## 2023-05-21 ENCOUNTER — Inpatient Hospital Stay (HOSPITAL_COMMUNITY): Payer: 59

## 2023-05-21 ENCOUNTER — Encounter: Payer: 59 | Admitting: Thoracic Surgery (Cardiothoracic Vascular Surgery)

## 2023-05-21 LAB — BASIC METABOLIC PANEL
Anion gap: 11 (ref 5–15)
BUN: 9 mg/dL (ref 8–23)
CO2: 27 mmol/L (ref 22–32)
Calcium: 9.1 mg/dL (ref 8.9–10.3)
Chloride: 95 mmol/L — ABNORMAL LOW (ref 98–111)
Creatinine, Ser: 0.8 mg/dL (ref 0.44–1.00)
GFR, Estimated: >60 mL/min (ref >60)
Glucose, Bld: 159 mg/dL — ABNORMAL HIGH (ref 70–99)
Potassium: 4.8 mmol/L (ref 3.5–5.1)
Sodium: 133 mmol/L — ABNORMAL LOW (ref 135–145)

## 2023-05-21 LAB — GLUCOSE, CAPILLARY
Glucose-Capillary: 169 mg/dL — ABNORMAL HIGH (ref 70–99)
Glucose-Capillary: 98 mg/dL (ref 70–99)

## 2023-05-21 MED ORDER — FERROUS SULFATE 325 (65 FE) MG PO TABS: 325.0000 mg | ORAL_TABLET | Freq: Every day | ORAL | 1 refills | Status: DC

## 2023-05-21 MED ORDER — OXYCODONE HCL 5 MG PO TABS: 5.0000 mg | ORAL_TABLET | ORAL | 0 refills | Status: AC | PRN

## 2023-05-21 MED ORDER — ASPIRIN 325 MG PO TBEC: 325.0000 mg | DELAYED_RELEASE_TABLET | Freq: Every day | ORAL | Status: DC

## 2023-05-21 MED ORDER — ASPIRIN 325 MG PO TBEC: 325.0000 mg | DELAYED_RELEASE_TABLET | Freq: Every day | ORAL | Status: AC

## 2023-05-21 MED ORDER — FUROSEMIDE 40 MG PO TABS: 40.0000 mg | ORAL_TABLET | Freq: Every day | ORAL | 0 refills | Status: DC

## 2023-05-21 MED ORDER — POTASSIUM CHLORIDE CRYS ER 20 MEQ PO TBCR: 20.0000 meq | EXTENDED_RELEASE_TABLET | Freq: Every day | ORAL | 0 refills | Status: DC

## 2023-05-21 MED ORDER — ATORVASTATIN CALCIUM 80 MG PO TABS: 80.0000 mg | ORAL_TABLET | Freq: Every day | ORAL | 5 refills | Status: DC

## 2023-05-21 MED FILL — Heparin Sodium (Porcine) Inj 1000 Unit/ML: Qty: 1000 | Status: AC

## 2023-05-21 MED FILL — Potassium Chloride Inj 2 mEq/ML: INTRAVENOUS | Qty: 40 | Status: AC

## 2023-05-21 MED FILL — Lidocaine HCl Local Preservative Free (PF) Inj 2%: INTRAMUSCULAR | Qty: 14 | Status: AC

## 2023-05-21 NOTE — Progress Notes (Signed)
Pt ambulated x 470 feet with front wheel walker tolerated well

## 2023-05-21 NOTE — Progress Notes (Signed)
CARDIAC REHAB PHASE I    Pt ready for discharge home today. All educational needs met. No questions or concerns. Referral sent to [facility] for CRP[facility].   [id number] Woodroe Chen, RN BSN 05/21/2023 10:03 AM

## 2023-05-21 NOTE — Plan of Care (Signed)
Discharge instructions discussed with patient.  Patient instructed on home medications, restrictions, and follow up appointments. Belongings gathered and sent with patient.  Patients medications sent to pharmacy

## 2023-05-21 NOTE — Progress Notes (Signed)
Physical Therapy Treatment Patient Details Name: Stacy Frost MRN: [id number] DOB: 03-Dec-1960 Today's Date: 05/21/2023   History of Present Illness Patient is a 62 y/o female admitted 05/11/23 with severe aortic stenosis now s/p AVR 05/14/23.  PMH positive for DM, GERD, HTN, MVP, RLS, HLD and obesity.    PT Comments  Reviewed plans for cardiac rehab at d/c and discussed a goal with heart walk coming up in Oct.  Patient mobilizing in room without device to bathroom so discussed using it initially but ok to wean off when feeling more stable on her feet.  Note for d/c home today so follow up through cardiac rehab recommended.  No further skilled PT needs at this time.     Assistance Recommended at Discharge Intermittent Supervision/Assistance  If plan is discharge home, recommend the following:  Can travel by private vehicle    A little help with walking and/or transfers;A little help with bathing/dressing/bathroom;Assistance with cooking/housework;Direct supervision/assist for medications management;Assist for transportation;Help with stairs or ramp for entrance      Equipment Recommendations  None recommended by PT    Recommendations for Other Services       Precautions / Restrictions Precautions Precautions: Fall;Sternal     Mobility  Bed Mobility               General bed mobility comments: up in bathroom    Transfers Overall transfer level: Needs assistance Equipment used: None Transfers: Sit to/from Stand Sit to Stand: Supervision           General transfer comment: increased time, cues for technique    Ambulation/Gait Ambulation/Gait assistance: Supervision Gait Distance (Feet): 300 Feet Assistive device: Rolling walker (2 wheels) Gait Pattern/deviations: Step-through pattern, Decreased stride length, Drifts right/left, Trunk flexed       General Gait Details: S for safety, noted some knee genuvarus deformities. Discussed walking at home   Stairs              Wheelchair Mobility     Tilt Bed    Modified Rankin (Stroke Patients Only)       Balance Overall balance assessment: Needs assistance   Sitting balance-Leahy Scale: Good     Standing balance support: No upper extremity supported, During functional activity Standing balance-Leahy Scale: Fair Standing balance comment: washing hands in sink with S                            Cognition Arousal/Alertness: Awake/alert Behavior During Therapy: WFL for tasks assessed/performed Overall Cognitive Status: Within Functional Limits for tasks assessed                                          Exercises      General Comments General comments (skin integrity, edema, etc.): spouse present, noted plans for d/c.  Discussed slow activity progression and plans for Cardiac rehab phase 2.  VSS following ambulation      Pertinent Vitals/Pain Pain Assessment Pain Score: 6  Pain Location: chest Pain Descriptors / Indicators: Aching, Discomfort Pain Intervention(s): Repositioned, Monitored during session    Home Living                          Prior Function            PT Goals (current goals can now be  found in the care plan section) Progress towards PT goals: Progressing toward goals    Frequency           PT Plan Current plan remains appropriate    Co-evaluation              AM-PAC PT "6 Clicks" Mobility   Outcome Measure  Help needed turning from your back to your side while in a flat bed without using bedrails?: A Little Help needed moving from lying on your back to sitting on the side of a flat bed without using bedrails?: A Little Help needed moving to and from a bed to a chair (including a wheelchair)?: A Little Help needed standing up from a chair using your arms (e.g., wheelchair or bedside chair)?: None Help needed to walk in hospital room?: A Little Help needed climbing 3-5 steps with a railing? :  Total 6 Click Score: 17    End of Session   Activity Tolerance: Patient tolerated treatment well Patient left: in bed;with family/visitor present   PT Visit Diagnosis: Other abnormalities of gait and mobility (R26.89);Muscle weakness (generalized) (M62.81)     Time: 1610-9604 PT Time Calculation (min) (ACUTE ONLY): 17 min  Charges:    $Gait Training: 8-22 mins PT General Charges $$ ACUTE PT VISIT: 1 Visit                     Sheran Lawless, PT Acute Rehabilitation Services Office:[phone] 05/21/2023    Elray Mcgregor 05/21/2023, 1:28 PM

## 2023-05-21 NOTE — Progress Notes (Addendum)
      [address]       [place] [address]             [phone]        7 Days Post-Op Procedure(s) (LRB): AORTIC VALVE REPLACEMENT (AVR) (N/A) TRANSESOPHAGEAL ECHOCARDIOGRAM (N/A)  Subjective:  Had a good day yesterday. Walked in the hall a couple of times. No nausea. Remains on RA.   Objective: Vital signs in last 24 hours: Temp:  [98.4 F (36.9 C)-98.8 F (37.1 C)] 98.4 F (36.9 C) (07/22 0540) Pulse Rate:  [89-102] 102 (07/22 0540) Cardiac Rhythm: Sinus tachycardia;Bundle branch block;Heart block (07/21 1924) Resp:  [18-25] 20 (07/22 0540) BP: (120-158)/(65-83) 150/69 (07/22 0540) SpO2:  [95 %-100 %] 96 % (07/22 0540) Weight:  [89.5 kg] 89.5 kg (07/22 0540)  Pre op weight 80.3 kg Current Weight  05/21/23 89.5 kg      Intake/Output from previous day: 07/21 0701 - 07/22 0700 In: 840 [P.O.:840] Out: -    Physical Exam:  Cardiovascular: Sinus rhythm / mild sinus tach, no murmur Pulmonary: breath sounds clear.  CXR showing better aeration, effusions improving.  Abdomen: Soft, non tender Extremities: well perfused, mild LE edema Wound: Clean and dry.  No erythema or signs of infection.  Lab Results: CBC: No results for input(s): "WBC", "HGB", "HCT", "PLT" in the last 72 hours.  BMET:  Recent Labs    05/20/23 0129 05/21/23 0203  NA 135 133*  K 4.1 4.8  CL 100 95*  CO2 26 27  GLUCOSE 168* 159*  BUN 12 9  CREATININE 0.82 0.80  CALCIUM 8.9 9.1    PT/INR:  Lab Results  Component Value Date   INR 1.6 (H) 05/14/2023   INR 1.1 05/14/2023   ABG:  INR: Will add last result for INR, ABG once components are confirmed Will add last 4 CBG results once components are confirmed  Assessment/Plan:  1. CV - SR with less tachycardia past 24 hours. On Lopressor 25 mg bid  . BP remains in 130's- 150's off of the Midodrine. Will resume lisinopril at discharge. 2.  Pulmonary - On room air. Less wheezing. CXR improved.   Advised to continue  using the incentive spirometer and flutter valve for the next week at home. 3. Volume Overload - I&O not recorded but Wt down 4lbs from yesterday 4.  Expected post op acute blood loss anemia - H and H stable, on oral iron supplement. 5. CBGs controlled. Pre op HGA1C 7.4. Resume her home regimen of metformin and Mounjaro at discharge.  6. Thrombocytopenia resolved 7. Disposition- discharge to home today. Follow up arranged and instructions given to Stacy Frost and her husband.   Leary Roca, PA-C 7:42 AM  Patient examined, discharge instructions reviewed. patient examined and medical record reviewed,agree with above note. Lovett Sox 05/21/2023

## 2023-05-22 ENCOUNTER — Ambulatory Visit: Payer: 59 | Admitting: Nurse Practitioner

## 2023-05-22 MED FILL — Sodium Bicarbonate IV Soln 8.4%: INTRAVENOUS | Qty: 50 | Status: AC

## 2023-05-22 MED FILL — Calcium Chloride Inj 10%: INTRAVENOUS | Qty: 10 | Status: AC

## 2023-05-22 MED FILL — Sodium Chloride IV Soln 0.9%: INTRAVENOUS | Qty: 1000 | Status: AC

## 2023-05-22 MED FILL — Heparin Sodium (Porcine) Inj 1000 Unit/ML: INTRAMUSCULAR | Qty: 10 | Status: AC

## 2023-05-22 MED FILL — Electrolyte-R (PH 7.4) Solution: INTRAVENOUS | Qty: 4000 | Status: AC

## 2023-05-22 MED FILL — Mannitol IV Soln 20%: INTRAVENOUS | Qty: 500 | Status: AC

## 2023-05-23 ENCOUNTER — Ambulatory Visit (INDEPENDENT_AMBULATORY_CARE_PROVIDER_SITE_OTHER): Payer: Self-pay | Admitting: Thoracic Surgery (Cardiothoracic Vascular Surgery)

## 2023-05-23 DIAGNOSIS — Z953 Presence of xenogenic heart valve: Secondary | ICD-10-CM

## 2023-05-23 NOTE — Progress Notes (Signed)
     [address]       [name] [address]             [phone]       Patient: Home Provider: Office Consent for Telemedicine visit obtained.  Today's visit was completed via a real-time telehealth (see specific modality noted below). The patient/authorized person provided oral consent at the time of the visit to engage in a telemedicine encounter with the present provider at [facility]. The patient/authorized person was informed of the potential benefits, limitations, and risks of telemedicine. The patient/authorized person expressed understanding that the laws that protect confidentiality also apply to telemedicine. The patient/authorized person acknowledged understanding that telemedicine does not provide emergency services and that he or she would need to call 911 or proceed to the nearest hospital for help if such a need arose.   Total time spent in the clinical discussion 10 minutes.  Telehealth Modality: Phone visit (audio only)  I had a telephone visit with  Stacy Frost who is s/p AVR.  Overall doing well.  Pain is minimal.  Ambulating well. Vitals have been stable.  Stacy Frost will see [place] back in 1 month with a chest x-ray for cardiac rehab clearance.  [name]

## 2023-05-25 ENCOUNTER — Telehealth: Payer: Self-pay | Admitting: Thoracic Surgery (Cardiothoracic Vascular Surgery)

## 2023-05-28 ENCOUNTER — Telehealth: Payer: Self-pay | Admitting: *Deleted

## 2023-05-28 NOTE — Telephone Encounter (Signed)
error 

## 2023-05-28 NOTE — Telephone Encounter (Signed)
Patient's son contacted the office stating patient gained 5lbs in one day. States her BP is slightly elevated and she is SOB with exertion. Recent weights starting 7/23: 190, 188.6, 185, 185, 183, and 188lb this morning. States patient weighs herself daily at the same time. States she has maintained a no salt diet. Appetite has not improved since surgery. States bilateral lower ankles are swollen. States she has been elevating legs at rest. Per Jacques Earthly, PA, Josh advised to have patient take Lasix 40mg  twice daily for 3 days (one at 8am and another at 2pm). Advised to take lasix daily after day 3. Advised to continue to take 1 potassium daily. Advised to continue to monitor weight daily and to call our office is SOB worsens. Josh verbalized understanding.

## 2023-05-31 ENCOUNTER — Ambulatory Visit: Payer: PRIVATE HEALTH INSURANCE | Admitting: Internal Medicine

## 2023-06-01 ENCOUNTER — Encounter: Payer: Self-pay | Admitting: Nurse Practitioner

## 2023-06-01 ENCOUNTER — Ambulatory Visit (INDEPENDENT_AMBULATORY_CARE_PROVIDER_SITE_OTHER): Payer: 59 | Admitting: Nurse Practitioner

## 2023-06-01 VITALS — BP 101/66 | HR 90 | Ht 65.0 in | Wt 183.2 lb

## 2023-06-01 DIAGNOSIS — Z7985 Long-term (current) use of injectable non-insulin antidiabetic drugs: Secondary | ICD-10-CM

## 2023-06-01 DIAGNOSIS — Z794 Long term (current) use of insulin: Secondary | ICD-10-CM | POA: Diagnosis not present

## 2023-06-01 DIAGNOSIS — Z7984 Long term (current) use of oral hypoglycemic drugs: Secondary | ICD-10-CM

## 2023-06-01 DIAGNOSIS — E1165 Type 2 diabetes mellitus with hyperglycemia: Secondary | ICD-10-CM

## 2023-06-01 MED ORDER — METFORMIN HCL ER 500 MG PO TB24
1000.0000 mg | ORAL_TABLET | Freq: Every day | ORAL | 1 refills | Status: DC
Start: 2023-06-01 — End: 2023-12-18

## 2023-06-01 MED ORDER — MOUNJARO 7.5 MG/0.5ML ~~LOC~~ SOAJ
7.5000 mg | SUBCUTANEOUS | 1 refills | Status: DC
Start: 2023-06-01 — End: 2023-11-16

## 2023-06-01 NOTE — Progress Notes (Signed)
Endocrinology Follow Up Note       06/01/2023, 11:07 AM   Subjective:    Patient ID: Stacy Frost, female    DOB: 07/28/1961.  Stacy Frost is being seen in follow up after being seen in consultation for management of currently uncontrolled symptomatic diabetes requested by  Kirstie Peri, MD.   Past Medical History:  Diagnosis Date   Anxiety    Aortic stenosis    Mild to moderate   Bicuspid aortic valve    Essential hypertension, benign    GERD (gastroesophageal reflux disease)    History of cardiac catheterization    Normal coronaries 2008   Hyperlipidemia    Migraine headache    MVP (mitral valve prolapse)    Obesity    Pain in joint, ankle and foot    Chronic   Palpitations    Documented PACs by previous monitoring   Restless leg syndrome    Skin tag    Type 2 diabetes mellitus (HCC)    Unilateral primary osteoarthritis, right knee 09/06/2016    Past Surgical History:  Procedure Laterality Date   ACHILLES TENDON REPAIR  2003   Left   AORTIC VALVE REPLACEMENT N/A 05/14/2023   Procedure: AORTIC VALVE REPLACEMENT (AVR);  Surgeon: Corliss Skains, MD;  Location: [facility] OR;  Service: Open Heart Surgery;  Laterality: N/A;   CARDIAC CATHETERIZATION  2008   Normal coronary arteries; normal LV systolic function; mild dilation of aortic root; mild dilation of proximal ascending aorta; likely bicuspid aortic valve   CHOLECYSTECTOMY  2001   "Poor EF at 17%"   COLONOSCOPY N/A 07/17/2014   Procedure: COLONOSCOPY;  Surgeon: Malissa Hippo, MD;  Location: [facility] ENDO SUITE;  Service: Endoscopy;  Laterality: N/A;  105   ESOPHAGEAL DILATION N/A 06/11/2015   Procedure: ESOPHAGEAL DILATION;  Surgeon: Malissa Hippo, MD;  Location: [facility] ORS;  Service: Endoscopy;  Laterality: N/AElease Hashimoto 63/56/58   ESOPHAGOGASTRODUODENOSCOPY N/A 07/17/2014   Procedure: ESOPHAGOGASTRODUODENOSCOPY (EGD);  Surgeon: Malissa Hippo, MD;  Location:  [facility];  Service: Endoscopy;  Laterality: N/A;   ESOPHAGOGASTRODUODENOSCOPY (EGD) WITH PROPOFOL N/A 06/11/2015   Procedure: ESOPHAGOGASTRODUODENOSCOPY (EGD) WITH PROPOFOL;  Surgeon: Malissa Hippo, MD;  Location: [facility] ORS;  Service: Endoscopy;  Laterality: N/A;  730   HERNIA REPAIR     umbilical    KNEE ARTHROSCOPY WITH MEDIAL MENISECTOMY Right 07/02/2018   Procedure: RIGHT KNEE ARTHROSCOPY, DEBRIDEMENT, MEDIAL MENISECTOMY;  Surgeon: [name], MD;  Location: [facility] OR;  Service: Orthopedics;  Laterality: Right;   KNEE SURGERY     MALONEY DILATION N/A 07/17/2014   Procedure: [name] DILATION;  Surgeon: Malissa Hippo, MD;  Location: [facility];  Service: Endoscopy;  Laterality: N/A;   RIGHT/LEFT HEART CATH AND CORONARY ANGIOGRAPHY N/A 05/02/2023   Procedure: RIGHT/LEFT HEART CATH AND CORONARY ANGIOGRAPHY;  Surgeon: [place], Peter M, MD;  Location: [facility] INVASIVE CV LAB;  Service: Cardiovascular;  Laterality: N/A;   TEE WITHOUT CARDIOVERSION N/A 05/14/2023   Procedure: TRANSESOPHAGEAL ECHOCARDIOGRAM;  Surgeon: Corliss Skains, MD;  Location: [facility] OR;  Service: Open Heart Surgery;  Laterality: N/A;   UMBILICAL HERNIA REPAIR  2003    Social  History   Socioeconomic History   Marital status: Married    Spouse name: Stacy Frost   Number of children: 2   Years of education: Not on file   Highest education level: Some college, no degree  Occupational History   Occupation: Arts development officer  Tobacco Use   Smoking status: Never   Smokeless tobacco: Never  Vaping Use   Vaping status: Never Used  Substance and Sexual Activity   Alcohol use: No    Alcohol/week: 0.0 standard drinks of alcohol   Drug use: No   Sexual activity: Not on file  Other Topics Concern   Not on file  Social History Narrative   Married, lives with husband in a one level home. They recently got a puppy. She avoids caffeine, does not exercise.    Social Determinants of Health   Financial Resource Strain: Not on file   Food Insecurity: No Food Insecurity (05/11/2023)   Hunger Vital Sign    Worried About Running Out of Food in the Last Year: Never true    Ran Out of Food in the Last Year: Never true  Transportation Needs: No Transportation Needs (05/11/2023)   PRAPARE - Administrator, Civil Service (Medical): No    Lack of Transportation (Non-Medical): No  Physical Activity: Not on file  Stress: No Stress Concern Present (11/27/2019)   Received from [facility], [facility] of Occupational Health - Occupational Stress Questionnaire    Feeling of Stress : Not at all  Social Connections: Not on file    Family History  Problem Relation Age of Onset   Depression Mother    Cancer Mother        Lung mets (unsure as to what kind)   Diabetes Mother    Hypertension Father    Depression Father    Heart attack Father    Depression Daughter    Anxiety disorder Daughter    Stroke Daughter    Transient ischemic attack Brother    Emphysema Maternal Grandfather    Brain cancer Paternal Grandmother    Heart attack Paternal Grandfather     Outpatient Encounter Medications as of 06/01/2023  Medication Sig   aspirin EC 325 MG tablet Take 1 tablet (325 mg total) by mouth daily.   celecoxib (CELEBREX) 200 MG capsule Take 200 mg by mouth 2 (two) times daily.   citalopram (CELEXA) 40 MG tablet Take 40 mg by mouth daily.   clonazePAM (KLONOPIN) 0.5 MG tablet Take 0.5 mg by mouth 2 (two) times daily as needed.   Continuous Blood Gluc Receiver (DEXCOM G7 RECEIVER) DEVI USE 1 receiver continuously   Continuous Blood Gluc Sensor (DEXCOM G7 SENSOR) MISC APPLY 1 SENSOR EVERY 10 DAYS   cyclobenzaprine (FLEXERIL) 10 MG tablet Take 10 mg by mouth 3 (three) times daily.   ferrous sulfate 325 (65 FE) MG tablet Take 1 tablet (325 mg total) by mouth daily with breakfast.   furosemide (LASIX) 40 MG tablet Take 1 tablet (40 mg total) by mouth daily.   metFORMIN (GLUCOPHAGE-XR) 500 MG 24 hr  tablet Take 2 tablets (1,000 mg total) by mouth daily with breakfast.   metoprolol (TOPROL-XL) 200 MG 24 hr tablet Take 200 mg by mouth daily.     potassium chloride (KLOR-CON M) 20 MEQ tablet Take 1 tablet (20 mEq total) by mouth daily for 7 days.   pramipexole (MIRAPEX) 0.5 MG tablet Take 0.5 mg by mouth at bedtime.   promethazine (  PHENERGAN) 25 MG tablet Take 25 mg by mouth 2 (two) times daily as needed.   traZODone (DESYREL) 100 MG tablet Take 100 mg by mouth at bedtime.   [DISCONTINUED] metFORMIN (GLUCOPHAGE) 1000 MG tablet Take 1 tablet (1,000 mg total) by mouth 2 (two) times daily. (Patient taking differently: Take 1,000 mg by mouth daily with breakfast.)   [DISCONTINUED] MOUNJARO 7.5 MG/0.5ML Pen INJECT 7.5MG  UNDER THE SKIN ONCE A WEEK   atorvastatin (LIPITOR) 80 MG tablet Take 1 tablet (80 mg total) by mouth daily. (Patient not taking: Reported on 06/01/2023)   tirzepatide (MOUNJARO) 7.5 MG/0.5ML Pen Inject 7.5 mg into the skin once a week.   No facility-administered encounter medications on file as of 06/01/2023.    ALLERGIES: Allergies  Allergen Reactions   Amlodipine Besylate Shortness Of Breath   Morphine And Codeine Shortness Of Breath and Other (See Comments)    Chest pain   Amitriptyline Other (See Comments)    Unknown reaction   Butorphanol Tartrate Other (See Comments)    REACTION: ED visit and got for migraine - not sure of response   Compazine [Prochlorperazine Edisylate] Other (See Comments)    Altered mental status   Rocephin [Ceftriaxone Sodium In Dextrose]     Given at GYN for infection - nauseated, dizziness (w/in last 7-8 years)   Sumatriptan Other (See Comments)    REACTION: HTN    VACCINATION STATUS: Immunization History  Administered Date(s) Administered   H1N1 08/20/2008   Influenza Whole 08/20/2008   Pneumococcal Polysaccharide-23 08/20/2008   Td 05/30/1996    Diabetes She presents for her follow-up diabetic visit. She has type 2 diabetes mellitus.  Onset time: Diagnosed at approx age of 74. Her disease course has been improving. There are no hypoglycemic associated symptoms. Associated symptoms include weight loss. There are no hypoglycemic complications. Symptoms are stable. There are no diabetic complications. Risk factors for coronary artery disease include diabetes mellitus, dyslipidemia, family history, obesity, hypertension, sedentary lifestyle and post-menopausal. Current diabetic treatment includes oral agent (monotherapy) (and Mounjaro). She is compliant with treatment most of the time. Her weight is decreasing steadily. She is following a generally healthy diet. When asked about meal planning, she reported none. She has not had a previous visit with a dietitian. She rarely participates in exercise. Her home blood glucose trend is decreasing steadily. Her overall blood glucose range is 140-180 mg/dl. (She presents today with her CGM showing at goal glycemic profile overall.  Her most recent A1c (done prior to recent surgery) was 7.4% on 7/15.  Analysis of her CGM shows TIR 88%, TAR 12%, TBR 0% with a GMI of 7%.  She notes while hospitalized her medications were adjusted.  She is still on Metformin but now on regular strength 1000 mg twice daily which does cause some nausea/vomiting.  ) An ACE inhibitor/angiotensin II receptor blocker is being taken. She does not see a podiatrist.Eye exam is current.    Review of systems  Constitutional: + steadily decreasing body weight,  current Body mass index is 30.49 kg/m. , no fatigue, no subjective hyperthermia, no subjective hypothermia Eyes: no blurry vision, no xerophthalmia ENT: no sore throat, no nodules palpated in throat, no dysphagia/odynophagia, no hoarseness Cardiovascular: no chest pain, no shortness of breath, no palpitations, no leg swelling Respiratory: no cough, no shortness of breath Gastrointestinal: no nausea/vomiting/diarrhea Musculoskeletal: no muscle/joint aches Skin: no rashes,  no hyperemia Neurological: no tremors, no numbness, no tingling, no dizziness Psychiatric: no depression, no anxiety  Objective:  BP 101/66 (BP Location: Right Arm, Patient Position: Sitting, Cuff Size: Large)   Pulse 90   Ht 5\' 5"  (1.651 m)   Wt 183 lb 3.2 oz (83.1 kg)   BMI 30.49 kg/m   Wt Readings from Last 3 Encounters:  06/01/23 183 lb 3.2 oz (83.1 kg)  05/21/23 197 lb 5 oz (89.5 kg)  05/11/23 177 lb (80.3 kg)     BP Readings from Last 3 Encounters:  06/01/23 101/66  05/21/23 139/74  05/11/23 101/68     Physical Exam- Limited  Constitutional:  Body mass index is 30.49 kg/m. , not in acute distress, normal state of mind Eyes:  EOMI, no exophthalmos Musculoskeletal: no gross deformities, strength intact in all four extremities, no gross restriction of joint movements Skin:  no rashes, no hyperemia Neurological: no tremor with outstretched hands   Diabetic Foot Exam - Simple   No data filed     CMP ( most recent) CMP     Component Value Date/Time   NA 133 (L) 05/21/2023 0203   K 4.8 05/21/2023 0203   CL 95 (L) 05/21/2023 0203   CO2 27 05/21/2023 0203   GLUCOSE 159 (H) 05/21/2023 0203   BUN 9 05/21/2023 0203   CREATININE 0.80 05/21/2023 0203   CALCIUM 9.1 05/21/2023 0203   PROT 8.2 (H) 06/16/2015 1535   ALBUMIN 4.8 06/16/2015 1535   AST 30 06/16/2015 1535   ALT 39 (H) 06/16/2015 1535   ALKPHOS 91 06/16/2015 1535   BILITOT 1.3 (H) 06/16/2015 1535   GFRNONAA >60 05/21/2023 0203   GFRAA >60 06/27/2018 0929     Diabetic Labs (most recent): Lab Results  Component Value Date   HGBA1C 7.4 (H) 05/14/2023   HGBA1C 7.4 (H) 05/13/2023   HGBA1C 6.2 (A) 11/29/2022   MICROALBUR 30 mg/L 08/29/2022     Lipid Panel ( most recent) Lipid Panel     Component Value Date/Time   CHOL 165 06/29/2008 0842   TRIG 206 06/29/2008 0842   HDL 42 06/29/2008 0842   LDLCALC 82 06/29/2008 0842      No results found for: "TSH", "FREET4"         Assessment &  Plan:   1) Type 2 diabetes mellitus with hyperglycemia, with long-term current use of insulin (HCC)  She presents today with her CGM showing at goal glycemic profile overall.  Her most recent A1c (done prior to recent surgery) was 7.4% on 7/15.  Analysis of her CGM shows TIR 88%, TAR 12%, TBR 0% with a GMI of 7%.  She notes while hospitalized her medications were adjusted.  She is still on Metformin but now on regular strength 1000 mg twice daily which does cause some nausea/vomiting.    - Jazmeen Axtell has currently uncontrolled symptomatic type 2 DM since 62 years of age.   -Recent labs reviewed.  - I had a long discussion with her about the progressive nature of diabetes and the pathology behind its complications. -her diabetes is not currently complicated but she remains at a high risk for more acute and chronic complications which include CAD, CVA, CKD, retinopathy, and neuropathy. These are all discussed in detail with her.  The following Lifestyle Medicine recommendations according to American College of Lifestyle Medicine [facility]) were discussed and offered to patient and she agrees to start the journey:  A. Whole Foods, Plant-based plate comprising of fruits and vegetables, plant-based proteins, whole-grain carbohydrates was discussed in detail with the patient.   A list for source  of those nutrients were also provided to the patient.  Patient will use only water or unsweetened tea for hydration. B.  The need to stay away from risky substances including alcohol, smoking; obtaining 7 to 9 hours of restorative sleep, at least 150 minutes of moderate intensity exercise weekly, the importance of healthy social connections,  and stress reduction techniques were discussed. C.  A full color page of  Calorie density of various food groups per pound showing examples of each food groups was provided to the patient.  - Nutritional counseling repeated at each appointment due to patients tendency to fall  back in to old habits.  - The patient admits there is a room for improvement in their diet and drink choices. -  Suggestion is made for the patient to avoid simple carbohydrates from their diet including Cakes, Sweet Desserts / Pastries, Ice Cream, Soda (diet and regular), Sweet Tea, Candies, Chips, Cookies, Sweet Pastries, Store Bought Juices, Alcohol in Excess of 1-2 drinks a day, Artificial Sweeteners, Coffee Creamer, and "Sugar-free" Products. This will help patient to have stable blood glucose profile and potentially avoid unintended weight gain.   - I encouraged the patient to switch to unprocessed or minimally processed complex starch and increased protein intake (animal or plant source), fruits, and vegetables.   - Patient is advised to stick to a routine mealtimes to eat 3 meals a day and avoid unnecessary snacks (to snack only to correct hypoglycemia).  - I have approached her with the following individualized plan to manage her diabetes and patient agrees:   -She is advised to continue Mounjaro 7.5 mg SQ weekly to and continue her Metformin 1000 mg ER PO daily with breakfast (will change her back to this rather than regular strength due to GI upset).   -she is encouraged to continue monitoring glucose 4 times daily (using her CGM), before meals and before bed, and to call the clinic if she has readings less than 70 or above 200 for 3 tests in a row.   - Adjustment parameters are given to her for hypo and hyperglycemia in writing.  - her Marcelline Deist was discontinued, due to polyuria and increased risk of UTI and yeast infections.  I also stopped her Novolog to prevent hypoglycemia.  - Specific targets for  A1c; LDL, HDL, and Triglycerides were discussed with the patient.  2) Blood Pressure /Hypertension:  her blood pressure is controlled to target.   she is advised to continue her current medications including Metoprolol 200 mg p.o. daily with breakfast, Lisinopril 20 mg po daily, Lasix  40 mg po daily.  3) Lipids/Hyperlipidemia:    There is no recent lipid panel to review.  she is advised to continue Simvastatin 40 mg daily at bedtime.  Side effects and precautions discussed with her.  Will recheck lipid panel prior to next visit.  4)  Weight/Diet:  her Body mass index is 30.49 kg/m.  -  clearly complicating her diabetes care.   she is a candidate for weight loss. I discussed with her the fact that loss of 5 - 10% of her  current body weight will have the most impact on her diabetes management.  Exercise, and detailed carbohydrates information provided  -  detailed on discharge instructions.  5) Chronic Care/Health Maintenance: -she is on ACEI/ARB and Statin medications and is encouraged to initiate and continue to follow up with Ophthalmology, Dentist, Podiatrist at least yearly or according to recommendations, and advised to stay away from smoking. I  have recommended yearly flu vaccine and pneumonia vaccine at least every 5 years; moderate intensity exercise for up to 150 minutes weekly; and sleep for at least 7 hours a day.  - she is advised to maintain close follow up with Kirstie Peri, MD for primary care needs, as well as her other providers for optimal and coordinated care.     I spent  46  minutes in the care of the patient today including review of labs from CMP, Lipids, Thyroid Function, Hematology (current and previous including abstractions from other facilities); face-to-face time discussing  her blood glucose readings/logs, discussing hypoglycemia and hyperglycemia episodes and symptoms, medications doses, her options of short and long term treatment based on the latest standards of care / guidelines;  discussion about incorporating lifestyle medicine;  and documenting the encounter. Risk reduction counseling performed per USPSTF guidelines to reduce obesity and cardiovascular risk factors.     Please refer to Patient Instructions for Blood Glucose Monitoring and  Insulin/Medications Dosing Guide"  in media tab for additional information. Please  also refer to " Patient Self Inventory" in the Media  tab for reviewed elements of pertinent patient history.  [name] participated in the discussions, expressed understanding, and voiced agreement with the above plans.  All questions were answered to her satisfaction. she is encouraged to contact clinic should she have any questions or concerns prior to her return visit.     Follow up plan: - Return in about 4 months (around 10/01/2023) for Diabetes F/U with A1c in office, Previsit labs, Bring meter and logs.   Ronny Bacon, [facility] [address] [place], [place] [address] Phone: [phone] Fax: [phone]  06/01/2023, 11:07 AM

## 2023-06-04 ENCOUNTER — Ambulatory Visit: Payer: 59 | Attending: Nurse Practitioner | Admitting: Nurse Practitioner

## 2023-06-04 VITALS — BP 130/82 | HR 85 | Ht 65.0 in | Wt 183.6 lb

## 2023-06-04 DIAGNOSIS — I10 Essential (primary) hypertension: Secondary | ICD-10-CM

## 2023-06-04 DIAGNOSIS — E785 Hyperlipidemia, unspecified: Secondary | ICD-10-CM

## 2023-06-04 DIAGNOSIS — Z952 Presence of prosthetic heart valve: Secondary | ICD-10-CM

## 2023-06-04 DIAGNOSIS — I5032 Chronic diastolic (congestive) heart failure: Secondary | ICD-10-CM | POA: Diagnosis not present

## 2023-06-04 MED ORDER — AMOXICILLIN 500 MG PO TABS: ORAL_TABLET | ORAL | 0 refills | Status: DC

## 2023-06-04 MED ORDER — POTASSIUM CHLORIDE CRYS ER 20 MEQ PO TBCR: 20.0000 meq | EXTENDED_RELEASE_TABLET | Freq: Every day | ORAL | 1 refills | Status: DC

## 2023-06-04 MED ORDER — METOPROLOL SUCCINATE ER 200 MG PO TB24: 200.0000 mg | ORAL_TABLET | Freq: Every day | ORAL | 1 refills | Status: DC

## 2023-06-04 MED ORDER — FUROSEMIDE 40 MG PO TABS: 40.0000 mg | ORAL_TABLET | Freq: Every day | ORAL | 0 refills | Status: DC

## 2023-06-04 NOTE — Progress Notes (Addendum)
Cardiology Office Note:  .   Date:  06/04/2023  ID:  Stacy Frost, DOB January 16, 1961, MRN [id number] PCP: Kirstie Peri, MD  [facility] Providers Cardiologist:  None    History of Present Illness: .   Stacy Frost is a 62 y.o. female with a PMH of severe aortic stenosis due to bicuspid aortic valve, s/p AVR, type 2 diabetes, dyslipidemia, hypertension, obesity, and migraine headaches, who presents today for post AVR follow-up.   Was initially referred to Dr. Cliffton Asters for evaluation for severe aortic stenosis.  She was admitted for suffering syncopal episodes at home while using bathroom.  Required urgent admission and surgical intervention.  Underwent AVR with 25 mm [employer] Inspiris Bovine pericardial tissue valve on 05/14/2023.  She was diuresed routinely postprocedure due to expected volume excess.  She also had expected acute blood loss anemia, required transfusion with 1 unit PRBC.  Today she presents for AVR follow-up. She states she is doing well. Shows me her log from home.  Overall her blood pressure and vital signs are stable.  Weight at one point does show some fluctuation and increase and was experiencing SHOB during this time.  She called into the office and was instructed to take an extra tablet of Lasix and then shows that her weight went back to her baseline.  Ever since then, her weight has been stable. Denies any chest pain, recent shortness of breath, palpitations, syncope, presyncope, dizziness, orthopnea, PND, recent swelling or significant weight changes, acute bleeding, or claudication. Does report nausea and loss of appetite.   Studies Reviewed: Marland Kitchen    TEE 04/2023:  Complications: No known complications during this procedure.  POST-OP IMPRESSIONS  _ Left Ventricle: The left ventricle is unchanged from pre-bypass.  _ Right Ventricle: The right ventricle appears unchanged from pre-bypass.  _ Aortic Valve: A bioprosthetic bioprosthetic valve was placed, leaflets   are  freely mobile and leaflets thin. Manufactured by; inspiris Size; 25mm.  There is  no regurgitation or stenosis of the new aortic valve.  _ Mitral Valve: The mitral valve appears unchanged from pre-bypass.   PRE-OP FINDINGS   Left Ventricle: The left ventricle has normal systolic function, with an  ejection fraction of 55-60%. The cavity size was normal. There is mildly  increased left ventricular wall thickness. There is mild concentric left  ventricular hypertrophy.  R/LHC 04/2023:  Normal coronary anatomy Normal LV filling pressure. PCWP 19/22 mean 13 mm Hg Normal right heart pressures. PAP 31/13 with mean 22 mm Hg Normal cardiac output 5.54 L/min, index 2.90   Plan: valve team assessment for AVR     Physical Exam:   VS:  BP 130/82   Pulse 85   Ht 5\' 5"  (1.651 m)   Wt 183 lb 9.6 oz (83.3 kg)   SpO2 98%   BMI 30.55 kg/m    Wt Readings from Last 3 Encounters:  06/04/23 183 lb 9.6 oz (83.3 kg)  06/01/23 183 lb 3.2 oz (83.1 kg)  05/21/23 197 lb 5 oz (89.5 kg)    GEN: Obese, 62 y.o. female in no acute distress NECK: No JVD; No carotid bruits CARDIAC: S1/S2, RRR, no murmurs, rubs, gallops RESPIRATORY:  Clear to auscultation without rales, wheezing or rhonchi  ABDOMEN: Soft, non-tender, non-distended EXTREMITIES:  No edema; No deformity   ASSESSMENT AND PLAN: .    AS, s/p AVR Recovering well after surgery. Scheduled for TTE later this month. SBE prophylaxis discussed and she verbalized understanding. Will write Rx for Amoxicillin  2,000 mg once per protocol - she confirmed with me NKDA to amoxicillin. Continue current medication regimen. Okay to start cardiac rehab. Heart healthy diet and regular cardiovascular exercise encouraged.    Cardiac Rehabilitation Eligibility Assessment  The patient is ready to start cardiac rehabilitation from a cardiac standpoint.    2.  HFpEF Admits to some component of weight gain and shortness of breath since leaving the hospital, was  instructed to take 1 extra tablet of Lasix that improved her symptoms, weight is back to baseline.  Recent TEE revealed EF 55 to 60%. Euvolemic and well compensated on exam.  Continue Lasix and potassium daily.  Continue Toprol-XL.  Will refill these medications. Low sodium diet, fluid restriction <2L, and daily weights encouraged. Educated to contact our office for weight gain of 2 lbs overnight or 5 lbs in one week.  Will see at next visit if we can start SGLT2 inhibitor/spironolactone.  3. HTN BP stable and BP log at home shows WNL readings. Discussed to monitor BP at home at least 2 hours after medications and sitting for 5-10 minutes.  No medication changes at this time.  She has pending future labs with endocrinology. Heart healthy diet encouraged.   4. Dyslipidemia She has pending future labs with endocrinology.  Continue atorvastatin. Heart healthy diet encouraged.   Dispo: Follow-up with me or APP in 6 to 8 weeks or sooner if anything changes.  Signed, Sharlene Dory, NP

## 2023-06-04 NOTE — Patient Instructions (Addendum)
Medication Instructions:  Your physician recommends that you continue on your current medications as directed. Please refer to the Current Medication list given to you today.  START AMOXICILLIN PRN FOR DENTAL PROCEDURES.  Labwork: None  Testing/Procedures: None  Follow-Up: Your physician recommends that you schedule a follow-up appointment in: 6-8 weeks  Any Other Special Instructions Will Be Listed Below (If Applicable).  HEART FAILURE INSTRUCTION SHEET  Follow a low-salt diet-you are allowed no more than 2,000 mg of sodium per day. Watch your fluid intake. In general, you should not be taking more than 64 ounces a day (no more than 8 glasses per day). Sometimes we refer to this as "2 liters per day." This includes sources of water in food like soup, coffee, tea, milk etc. Weigh yourself on the same scale at the same time of the day preferably immediately after your first void. Keep a log of your weights. Call your doctor: (Anytime you feel any of the following symptoms)  3 lbs weight gain overnight or 5 lbs within a week Shortness of breath, with or without a day hacking cough Swelling in hands, feet or stomach If you have to sleep on extra pillows at night in order to breathe   IT IS IMPORTANT TO LET YOUR DOCTOR KNOW EARLY ON IF YOU ARE HAVING SYMPTOMS SO WE CAN HELP YOU!    If you need a refill on your cardiac medications before your next appointment, please call your pharmacy.

## 2023-06-12 NOTE — Progress Notes (Addendum)
[address]       [name] [address]             [phone]    HPI: Patient is s/p aortic valve replacement  (a 25mm Inspiris) by Dr. Cliffton Asters on 05/14/2023. She was discharged in stable condition on 05/21/2023. She has Had a virtual appointment with Dr. Cliffton Asters and was doing well at that time. She contacted our office on 05/28/2023 because of increasing weight and bilateral ankle edema. She was instructed to increase Lasix to 40 mg bid with potassium for 3 days then continue taking daily Lasix and potassium, low salt diet, and keep legs elevated when sitting. She presents today for in person post op follow up. She denies chest pain or shortness of breath.  Current Outpatient Medications  Medication Sig Dispense Refill   amoxicillin (AMOXIL) 500 MG tablet Take 4 tablets (2,000 mg total) by mouth once 30-60 minutes prior to dental procedure 4 tablet 0   aspirin EC 325 MG tablet Take 1 tablet (325 mg total) by mouth daily.     atorvastatin (LIPITOR) 80 MG tablet Take 1 tablet (80 mg total) by mouth daily. (Patient not taking: Reported on 06/01/2023) 30 tablet 5   celecoxib (CELEBREX) 200 MG capsule Take 200 mg by mouth 2 (two) times daily.     citalopram (CELEXA) 40 MG tablet Take 40 mg by mouth daily.     clonazePAM (KLONOPIN) 0.5 MG tablet Take 0.5 mg by mouth 2 (two) times daily as needed.     Continuous Blood Gluc Receiver (DEXCOM G7 RECEIVER) DEVI USE 1 receiver continuously     Continuous Blood Gluc Sensor (DEXCOM G7 SENSOR) MISC APPLY 1 SENSOR EVERY 10 DAYS     cyclobenzaprine (FLEXERIL) 10 MG tablet Take 10 mg by mouth 3 (three) times daily.     ferrous sulfate 325 (65 FE) MG tablet Take 1 tablet (325 mg total) by mouth daily with breakfast. 60 tablet 1   furosemide (LASIX) 40 MG tablet Take 1 tablet (40 mg total) by mouth daily. 7 tablet 0   metFORMIN (GLUCOPHAGE-XR) 500 MG 24 hr tablet Take 2 tablets (1,000 mg total) by mouth daily with breakfast. 180 tablet 1    metoprolol (TOPROL-XL) 200 MG 24 hr tablet Take 1 tablet (200 mg total) by mouth daily. 90 tablet 1   potassium chloride SA (KLOR-CON M) 20 MEQ tablet Take 1 tablet (20 mEq total) by mouth daily. 90 tablet 1   pramipexole (MIRAPEX) 0.5 MG tablet Take 0.5 mg by mouth at bedtime.     promethazine (PHENERGAN) 25 MG tablet Take 25 mg by mouth 2 (two) times daily as needed.     tirzepatide (MOUNJARO) 7.5 MG/0.5ML Pen Inject 7.5 mg into the skin once a week. 6 mL 1   traZODone (DESYREL) 100 MG tablet Take 100 mg by mouth at bedtime.    Vital Signs: Vitals:   06/21/23 1328  BP: (!) 148/84  Pulse: 88  Resp: 20  SpO2: 98%    Physical Exam: CV-RRR, no murmur Pulmonary-Clear to auscultation bilaterally Extremities-No LE edema Wound-Clean, dry, well healed  Diagnostic Tests: Narrative & Impression  CLINICAL DATA:  Status post mitral valve repair 5 weeks ago.   EXAM: CHEST - 2 VIEW   COMPARISON:  May 21, 2023.   FINDINGS: The heart size and mediastinal contours are within normal limits. Sternotomy wires are noted. Elevated right hemidiaphragm is noted. Both lungs are clear. The visualized skeletal structures are  unremarkable.   IMPRESSION: No active cardiopulmonary disease.     Electronically Signed   By: Lupita Raider M.D.   On: 06/21/2023 13:56   Impression and Plan: We discussed today's chest x ray results (elevated right hemi diaphragm). As discussed with Dr. Cliffton Asters, no need for follow up for this (she is asymptomatic).  She has been seen by cardiology NP on 06/04/2023. There were no changes to her medications. She was instructed to continue with Lasix and potassium daily. Cardiology will consider starting SGLT2 inhibitor/Spironolactone at next office visit. We discussed continuing sternal precautions, driving, endocarditis prophylaxis, and participation in cardiac rehab (referral to [facility] All of this was summarized in AVS. We also discussed the importance of good  glucose control and she has already seen her endocrinology NP on 08/02 in follow up. She has an echocardiogram scheduled for 06/26/2023. She will see TCTS PRN and continue to be followed by cardiology.     Ardelle Balls, PA-C [facility] [phone]

## 2023-06-20 ENCOUNTER — Other Ambulatory Visit: Payer: Self-pay | Admitting: Thoracic Surgery (Cardiothoracic Vascular Surgery)

## 2023-06-20 DIAGNOSIS — Z953 Presence of xenogenic heart valve: Secondary | ICD-10-CM

## 2023-06-21 ENCOUNTER — Ambulatory Visit
Admission: RE | Admit: 2023-06-21 | Discharge: 2023-06-21 | Disposition: A | Discharge status: Home / Self Care | Payer: 59 | Source: Ambulatory Visit | Attending: Thoracic Surgery (Cardiothoracic Vascular Surgery) | Admitting: Thoracic Surgery (Cardiothoracic Vascular Surgery)

## 2023-06-21 ENCOUNTER — Other Ambulatory Visit: Payer: Self-pay

## 2023-06-21 ENCOUNTER — Ambulatory Visit (INDEPENDENT_AMBULATORY_CARE_PROVIDER_SITE_OTHER): Payer: Self-pay | Admitting: Physician Assistant

## 2023-06-21 ENCOUNTER — Encounter: Payer: Self-pay | Admitting: Physician Assistant

## 2023-06-21 VITALS — BP 148/84 | HR 88 | Resp 20 | Ht 65.0 in | Wt 186.2 lb

## 2023-06-21 DIAGNOSIS — Z953 Presence of xenogenic heart valve: Secondary | ICD-10-CM

## 2023-06-21 DIAGNOSIS — Z951 Presence of aortocoronary bypass graft: Secondary | ICD-10-CM

## 2023-06-21 NOTE — Patient Instructions (Addendum)
You are encouraged to enroll and participate in the outpatient cardiac rehab program beginning as soon as practical.  You may return to driving an automobile as long as you are no longer requiring oral narcotic pain relievers during the daytime.  It would be wise to start driving only short distances during the daylight and gradually increase from there as you feel comfortable.  Continue to avoid any heavy lifting or strenuous use of your arms or shoulders for at least a total of two months from the time of surgery.  After two months, you may gradually increase how much you lift or otherwise use your arms or chest as tolerated, with limits based upon whether or not activities lead to the return of significant discomfort.  Endocarditis is a potentially serious infection of heart valves or inside lining of the heart.  It occurs more commonly in patients with diseased heart valves (such as patient's with aortic or mitral valve disease) and in patients who have undergone heart valve repair or replacement.  Certain surgical and dental procedures may put you at risk, such as dental cleaning, other dental procedures, or any surgery involving the respiratory, urinary, gastrointestinal tract, gallbladder or prostate gland.   To minimize your chances for develooping endocarditis, maintain good oral health and seek prompt medical attention for any infections involving the mouth, teeth, gums, skin or urinary tract.    Always notify your doctor or dentist about your underlying heart valve condition before having any invasive procedures. You will need to take antibiotics before certain procedures, including all routine dental cleanings or other dental procedures.  Your cardiologist or dentist should prescribe these antibiotics for you to be taken ahead of time.  Make every effort to keep your diabetes under very tight control.  Follow up closely with your primary care physician or endocrinologist and strive to keep  their hemoglobin A1c levels as low as possible, preferably near or below 6.0.  The long term benefits of strict control of diabetes are far reaching and critically important for your overall health and survival.

## 2023-06-22 ENCOUNTER — Encounter: Payer: Self-pay | Admitting: *Deleted

## 2023-06-26 ENCOUNTER — Ambulatory Visit (HOSPITAL_COMMUNITY)
Admission: RE | Admit: 2023-06-26 | Discharge: 2023-06-26 | Disposition: A | Discharge status: Home / Self Care | Payer: 59 | Source: Ambulatory Visit | Attending: Internal Medicine | Admitting: Internal Medicine

## 2023-06-26 DIAGNOSIS — Z952 Presence of prosthetic heart valve: Secondary | ICD-10-CM | POA: Insufficient documentation

## 2023-06-26 LAB — ECHOCARDIOGRAM COMPLETE
AR max vel: 1.66 cm2
AV Area VTI: 1.65 cm2
AV Area mean vel: 1.66 cm2
AV Mean grad: 4 mmHg
AV Peak grad: 8.2 mmHg
Ao pk vel: 1.43 m/s
Area-P 1/2: 3.17 cm2
S' Lateral: 2.4 cm

## 2023-06-26 NOTE — Progress Notes (Signed)
*  PRELIMINARY RESULTS* Echocardiogram 2D Echocardiogram has been performed.  Stacy Frost 06/26/2023, 11:46 AM

## 2023-07-04 ENCOUNTER — Encounter (HOSPITAL_COMMUNITY): Payer: 59

## 2023-07-09 ENCOUNTER — Encounter (HOSPITAL_COMMUNITY)
Admission: RE | Admit: 2023-07-09 | Discharge: 2023-07-09 | Disposition: A | Discharge status: Home / Self Care | Payer: 59 | Source: Ambulatory Visit | Attending: Internal Medicine | Admitting: Internal Medicine

## 2023-07-09 DIAGNOSIS — Z952 Presence of prosthetic heart valve: Secondary | ICD-10-CM | POA: Insufficient documentation

## 2023-07-09 NOTE — Progress Notes (Signed)
Completed virtual orientation today.  EP evaluation is scheduled for 07/10/23 at 830.  Documentation for diagnosis can be found in [facility] encounter 05/14/23 and OV 06/04/23.

## 2023-07-10 ENCOUNTER — Encounter (HOSPITAL_COMMUNITY)
Admission: RE | Admit: 2023-07-10 | Discharge: 2023-07-10 | Disposition: A | Discharge status: Home / Self Care | Payer: 59 | Source: Ambulatory Visit | Attending: Internal Medicine | Admitting: Internal Medicine

## 2023-07-10 VITALS — Ht 65.0 in | Wt 192.0 lb

## 2023-07-10 DIAGNOSIS — Z952 Presence of prosthetic heart valve: Secondary | ICD-10-CM

## 2023-07-10 NOTE — Patient Instructions (Signed)
Patient Instructions  Patient Details  Name: Stacy Frost MRN: [id number] Date of Birth: September 20, 1961 Referring Provider:  Corliss Skains, MD  Below are your personal goals for exercise, nutrition, and risk factors. Our goal is to help you stay on track towards obtaining and maintaining these goals. We will be discussing your progress on these goals with you throughout the program.  Initial Exercise Prescription:  Initial Exercise Prescription - 07/10/23 0900       Date of Initial Exercise RX and Referring Provider   Date 07/10/23    Referring Provider Dietrich Pates MD      Oxygen   Maintain Oxygen Saturation 88% or higher      Treadmill   MPH 1.2    Grade 0.5    Minutes 15    METs 2      REL-XR   Level 1    Speed 50    Minutes 15    METs 2      Prescription Details   Frequency (times per week) 3    Duration Progress to 30 minutes of continuous aerobic without signs/symptoms of physical distress      Intensity   THRR 40-80% of Max Heartrate 110-141    Ratings of Perceived Exertion 11-13    Perceived Dyspnea 0-4      Progression   Progression Continue to progress workloads to maintain intensity without signs/symptoms of physical distress.      Resistance Training   Training Prescription Yes    Weight 3 lb    Reps 10-15             Exercise Goals: Frequency: Be able to perform aerobic exercise two to three times per week in program working toward 2-5 days per week of home exercise.  Intensity: Work with a perceived exertion of 11 (fairly light) - 15 (hard) while following your exercise prescription.  We will make changes to your prescription with you as you progress through the program.   Duration: Be able to do 30 to 45 minutes of continuous aerobic exercise in addition to a 5 minute warm-up and a 5 minute cool-down routine.   Nutrition Goals: Your personal nutrition goals will be established when you do your nutrition analysis with the dietician.  The  following are general nutrition guidelines to follow: Cholesterol < 200mg /day Sodium < 1500mg /day Fiber: Women over 50 yrs - 21 grams per day  Personal Goals:  Personal Goals and Risk Factors at Admission - 07/10/23 0948       Core Components/Risk Factors/Patient Goals on Admission    Weight Management Yes;Obesity;Weight Loss    Intervention Weight Management: Develop a combined nutrition and exercise program designed to reach desired caloric intake, while maintaining appropriate intake of nutrient and fiber, sodium and fats, and appropriate energy expenditure required for the weight goal.;Weight Management: Provide education and appropriate resources to help participant work on and attain dietary goals.;Weight Management/Obesity: Establish reasonable short term and long term weight goals.;Obesity: Provide education and appropriate resources to help participant work on and attain dietary goals.    Admit Weight 192 lb (87.1 kg)    Goal Weight: Short Term 187 lb (84.8 kg)    Goal Weight: Long Term 180 lb (81.6 kg)    Expected Outcomes Short Term: Continue to assess and modify interventions until short term weight is achieved;Long Term: Adherence to nutrition and physical activity/exercise program aimed toward attainment of established weight goal;Weight Loss: Understanding of general recommendations for a balanced deficit meal plan,  which promotes 1-2 lb weight loss per week and includes a negative energy balance of [phone] kcal/d;Understanding recommendations for meals to include 15-35% energy as protein, 25-35% energy from fat, 35-60% energy from carbohydrates, less than 200mg  of dietary cholesterol, 20-35 gm of total fiber daily;Understanding of distribution of calorie intake throughout the day with the consumption of 4-5 meals/snacks    Diabetes Yes    Intervention Provide education about signs/symptoms and action to take for hypo/hyperglycemia.;Provide education about proper nutrition, including  hydration, and aerobic/resistive exercise prescription along with prescribed medications to achieve blood glucose in normal ranges: Fasting glucose 65-99 mg/dL    Expected Outcomes Short Term: Participant verbalizes understanding of the signs/symptoms and immediate care of hyper/hypoglycemia, proper foot care and importance of medication, aerobic/resistive exercise and nutrition plan for blood glucose control.;Long Term: Attainment of HbA1C < 7%.    Hypertension Yes    Intervention Provide education on lifestyle modifcations including regular physical activity/exercise, weight management, moderate sodium restriction and increased consumption of fresh fruit, vegetables, and low fat dairy, alcohol moderation, and smoking cessation.;Monitor prescription use compliance.    Expected Outcomes Short Term: Continued assessment and intervention until BP is < 140/23mm HG in hypertensive participants. < 130/61mm HG in hypertensive participants with diabetes, heart failure or chronic kidney disease.;Long Term: Maintenance of blood pressure at goal levels.    Lipids Yes    Intervention Provide education and support for participant on nutrition & aerobic/resistive exercise along with prescribed medications to achieve LDL 70mg , HDL >40mg .    Expected Outcomes Short Term: Participant states understanding of desired cholesterol values and is compliant with medications prescribed. Participant is following exercise prescription and nutrition guidelines.;Long Term: Cholesterol controlled with medications as prescribed, with individualized exercise RX and with personalized nutrition plan. Value goals: LDL < 70mg , HDL > 40 mg.             Tobacco Use Initial Evaluation: Social History   Tobacco Use  Smoking Status Never  Smokeless Tobacco Never    Exercise Goals and Review:  Exercise Goals     Row Name 07/10/23 0946             Exercise Goals   Increase Physical Activity Yes       Intervention Provide  advice, education, support and counseling about physical activity/exercise needs.;Develop an individualized exercise prescription for aerobic and resistive training based on initial evaluation findings, risk stratification, comorbidities and participant's personal goals.       Expected Outcomes Short Term: Attend rehab on a regular basis to increase amount of physical activity.;Long Term: Add in home exercise to make exercise part of routine and to increase amount of physical activity.;Long Term: Exercising regularly at least 3-5 days a week.       Increase Strength and Stamina Yes       Intervention Provide advice, education, support and counseling about physical activity/exercise needs.;Develop an individualized exercise prescription for aerobic and resistive training based on initial evaluation findings, risk stratification, comorbidities and participant's personal goals.       Expected Outcomes Short Term: Perform resistance training exercises routinely during rehab and add in resistance training at home;Short Term: Increase workloads from initial exercise prescription for resistance, speed, and METs.;Long Term: Improve cardiorespiratory fitness, muscular endurance and strength as measured by increased METs and functional capacity ( )       Able to understand and use rate of perceived exertion (RPE) scale Yes       Intervention Provide education and explanation on  how to use RPE scale       Expected Outcomes Short Term: Able to use RPE daily in rehab to express subjective intensity level;Long Term:  Able to use RPE to guide intensity level when exercising independently       Able to understand and use Dyspnea scale Yes       Intervention Provide education and explanation on how to use Dyspnea scale       Expected Outcomes Short Term: Able to use Dyspnea scale daily in rehab to express subjective sense of shortness of breath during exertion;Long Term: Able to use Dyspnea scale to guide intensity  level when exercising independently       Knowledge and understanding of Target Heart Rate Range (THRR) Yes       Intervention Provide education and explanation of THRR including how the numbers were predicted and where they are located for reference       Expected Outcomes Short Term: Able to state/look up THRR;Long Term: Able to use THRR to govern intensity when exercising independently;Short Term: Able to use daily as guideline for intensity in rehab       Able to check pulse independently Yes       Intervention Provide education and demonstration on how to check pulse in carotid and radial arteries.;Review the importance of being able to check your own pulse for safety during independent exercise       Expected Outcomes Short Term: Able to explain why pulse checking is important during independent exercise;Long Term: Able to check pulse independently and accurately       Understanding of Exercise Prescription Yes       Intervention Provide education, explanation, and written materials on patient's individual exercise prescription       Expected Outcomes Short Term: Able to explain program exercise prescription;Long Term: Able to explain home exercise prescription to exercise independently              Copy of goals given to participant.

## 2023-07-10 NOTE — Progress Notes (Signed)
Cardiac Individual Treatment Plan  Patient Details  Name: Stacy Frost MRN: [id number] Date of Birth: 1961/07/21 Referring Provider:   Flowsheet Row CARDIAC REHAB PHASE II ORIENTATION from 07/10/2023 in [facility] CARDIAC REHABILITATION  Referring Provider Dietrich Pates MD       Initial Encounter Date:  Flowsheet Row CARDIAC REHAB PHASE II ORIENTATION from 07/10/2023 in [place] CARDIAC REHABILITATION  Date 07/10/23       Visit Diagnosis: S/P AVR (aortic valve replacement)  Patient's Home Medications on Admission:  Current Outpatient Medications:    aspirin EC 325 MG tablet, Take 1 tablet (325 mg total) by mouth daily., Disp: , Rfl:    atorvastatin (LIPITOR) 80 MG tablet, Take 1 tablet (80 mg total) by mouth daily., Disp: 30 tablet, Rfl: 5   celecoxib (CELEBREX) 200 MG capsule, Take 200 mg by mouth 2 (two) times daily., Disp: , Rfl:    citalopram (CELEXA) 40 MG tablet, Take 40 mg by mouth daily., Disp: , Rfl:    clonazePAM (KLONOPIN) 0.5 MG tablet, Take 0.5 mg by mouth 2 (two) times daily as needed., Disp: , Rfl:    Continuous Blood Gluc Receiver (DEXCOM G7 RECEIVER) DEVI, USE 1 receiver continuously, Disp: , Rfl:    Continuous Blood Gluc Sensor (DEXCOM G7 SENSOR) MISC, APPLY 1 SENSOR EVERY 10 DAYS, Disp: , Rfl:    cyclobenzaprine (FLEXERIL) 10 MG tablet, Take 10 mg by mouth 3 (three) times daily., Disp: , Rfl:    ferrous sulfate 325 (65 FE) MG tablet, Take 1 tablet (325 mg total) by mouth daily with breakfast., Disp: 60 tablet, Rfl: 1   furosemide (LASIX) 40 MG tablet, Take 1 tablet (40 mg total) by mouth daily., Disp: 7 tablet, Rfl: 0   metFORMIN (GLUCOPHAGE-XR) 500 MG 24 hr tablet, Take 2 tablets (1,000 mg total) by mouth daily with breakfast., Disp: 180 tablet, Rfl: 1   metoprolol (TOPROL-XL) 200 MG 24 hr tablet, Take 1 tablet (200 mg total) by mouth daily., Disp: 90 tablet, Rfl: 1   potassium chloride SA (KLOR-CON M) 20 MEQ tablet, Take 1 tablet (20 mEq total) by mouth daily.,  Disp: 90 tablet, Rfl: 1   pramipexole (MIRAPEX) 0.5 MG tablet, Take 0.5 mg by mouth at bedtime., Disp: , Rfl:    promethazine (PHENERGAN) 25 MG tablet, Take 25 mg by mouth 2 (two) times daily as needed., Disp: , Rfl:    tirzepatide (MOUNJARO) 7.5 MG/0.5ML Pen, Inject 7.5 mg into the skin once a week., Disp: 6 mL, Rfl: 1   traZODone (DESYREL) 100 MG tablet, Take 100 mg by mouth at bedtime., Disp: , Rfl:   Past Medical History: Past Medical History:  Diagnosis Date   Anxiety    Aortic stenosis    Mild to moderate   Bicuspid aortic valve    Essential hypertension, benign    GERD (gastroesophageal reflux disease)    History of cardiac catheterization    Normal coronaries 2008   Hyperlipidemia    Migraine headache    MVP (mitral valve prolapse)    Obesity    Pain in joint, ankle and foot    Chronic   Palpitations    Documented PACs by previous monitoring   Restless leg syndrome    Skin tag    Type 2 diabetes mellitus (HCC)    Unilateral primary osteoarthritis, right knee 09/06/2016    Tobacco Use: Social History   Tobacco Use  Smoking Status Never  Smokeless Tobacco Never    Labs: Review Flowsheet  More  data exists      Latest Ref Rng & Units 07/25/2022 11/29/2022 05/02/2023 05/13/2023 05/14/2023  Labs for ITP Cardiac and Pulmonary Rehab  Hemoglobin A1c 4.8 - 5.6 % 7.8  6.2  - 7.4  7.4   PH, Arterial 7.35 - 7.45 - - 7.376  - 7.351  7.336  7.378  7.436  7.405  7.389  7.385  7.5   PCO2 arterial 32 - 48 mmHg - - 35.7  - 37.6  41.5  41.5  36.3  41.5  43.6  46.1  37   Bicarbonate 20.0 - 28.0 mmol/L - - 22.4  22.3  20.9  - 20.7  22.1  24.5  24.4  26.1  27.9  26.3  27.6  28.9   TCO2 22 - 32 mmol/L - - 24  24  22   - 22  23  26  26  29  27  27  30  29  28  29  29  29    Acid-base deficit 0.0 - 2.0 mmol/L - - 3.0  3.0  4.0  - 4.0  3.0  1.0   O2 Saturation % - - 67  66  94  - 88  98  98  100  100  84  100  99  96.4     Details       Multiple values from one day are sorted in  reverse-chronological order         Capillary Blood Glucose: Lab Results  Component Value Date   GLUCAP 98 05/21/2023   GLUCAP 169 (H) 05/21/2023   GLUCAP 210 (H) 05/20/2023   GLUCAP 208 (H) 05/20/2023   GLUCAP 135 (H) 05/20/2023     Exercise Target Goals: Exercise Program Goal: Individual exercise prescription set using results from initial 6 min walk test and THRR while considering  patient's activity barriers and safety.   Exercise Prescription Goal: Starting with aerobic activity 30 plus minutes a day, 3 days per week for initial exercise prescription. Provide home exercise prescription and guidelines that participant acknowledges understanding prior to discharge.  Activity Barriers & Risk Stratification:  Activity Barriers & Cardiac Risk Stratification - 07/09/23 1150       Activity Barriers & Cardiac Risk Stratification   Activity Barriers Arthritis;Deconditioning;Muscular Weakness;Balance Concerns;History of Falls   bilateral hips arthritis   Cardiac Risk Stratification Moderate             6 Minute Walk:  6 Minute Walk     Row Name 07/10/23 0937         6 Minute Walk   Phase Initial     Distance 870 feet     Walk Time 6 minutes     MPH 1.65     METS 2.36     RPE 12     Perceived Dyspnea  1     VO2 Peak 8.25     Symptoms Yes (comment)     Comments SOB, chronic bilateral hip pain 7/10     Resting HR 78 bpm     Resting BP 124/64     Resting Oxygen Saturation  98 %     Exercise Oxygen Saturation  during 6 min walk 97 %     Max Ex. HR 108 bpm     Max Ex. BP 134/64     2 Minute Post BP 118/66              Oxygen Initial Assessment:   Oxygen Re-Evaluation:  Oxygen Discharge (Final Oxygen Re-Evaluation):   Initial Exercise Prescription:  Initial Exercise Prescription - 07/10/23 0900       Date of Initial Exercise RX and Referring Provider   Date 07/10/23    Referring Provider Dietrich Pates MD      Oxygen   Maintain Oxygen  Saturation 88% or higher      Treadmill   MPH 1.2    Grade 0.5    Minutes 15    METs 2      REL-XR   Level 1    Speed 50    Minutes 15    METs 2      Prescription Details   Frequency (times per week) 3    Duration Progress to 30 minutes of continuous aerobic without signs/symptoms of physical distress      Intensity   THRR 40-80% of Max Heartrate 110-141    Ratings of Perceived Exertion 11-13    Perceived Dyspnea 0-4      Progression   Progression Continue to progress workloads to maintain intensity without signs/symptoms of physical distress.      Resistance Training   Training Prescription Yes    Weight 3 lb    Reps 10-15             Perform Capillary Blood Glucose checks as needed.  Exercise Prescription Changes:   Exercise Prescription Changes     Row Name 07/10/23 0900             Response to Exercise   Blood Pressure (Admit) 124/64       Blood Pressure (Exercise) 134/64       Blood Pressure (Exit) 118/66       Heart Rate (Admit) 78 bpm       Heart Rate (Exercise) 108 bpm       Heart Rate (Exit) 86 bpm       Oxygen Saturation (Admit) 98 %       Oxygen Saturation (Exercise) 97 %       Rating of Perceived Exertion (Exercise) 12       Perceived Dyspnea (Exercise) 1       Symptoms SOB, chronic bilateral hip pain 7/10       Comments walk test results                Exercise Comments:   Exercise Goals and Review:   Exercise Goals     Row Name 07/10/23 0946             Exercise Goals   Increase Physical Activity Yes       Intervention Provide advice, education, support and counseling about physical activity/exercise needs.;Develop an individualized exercise prescription for aerobic and resistive training based on initial evaluation findings, risk stratification, comorbidities and participant's personal goals.       Expected Outcomes Short Term: Attend rehab on a regular basis to increase amount of physical activity.;Long Term: Add in  home exercise to make exercise part of routine and to increase amount of physical activity.;Long Term: Exercising regularly at least 3-5 days a week.       Increase Strength and Stamina Yes       Intervention Provide advice, education, support and counseling about physical activity/exercise needs.;Develop an individualized exercise prescription for aerobic and resistive training based on initial evaluation findings, risk stratification, comorbidities and participant's personal goals.       Expected Outcomes Short Term: Perform resistance training exercises routinely during rehab and add in resistance  training at home;Short Term: Increase workloads from initial exercise prescription for resistance, speed, and METs.;Long Term: Improve cardiorespiratory fitness, muscular endurance and strength as measured by increased METs and functional capacity ( )       Able to understand and use rate of perceived exertion (RPE) scale Yes       Intervention Provide education and explanation on how to use RPE scale       Expected Outcomes Short Term: Able to use RPE daily in rehab to express subjective intensity level;Long Term:  Able to use RPE to guide intensity level when exercising independently       Able to understand and use Dyspnea scale Yes       Intervention Provide education and explanation on how to use Dyspnea scale       Expected Outcomes Short Term: Able to use Dyspnea scale daily in rehab to express subjective sense of shortness of breath during exertion;Long Term: Able to use Dyspnea scale to guide intensity level when exercising independently       Knowledge and understanding of Target Heart Rate Range (THRR) Yes       Intervention Provide education and explanation of THRR including how the numbers were predicted and where they are located for reference       Expected Outcomes Short Term: Able to state/look up THRR;Long Term: Able to use THRR to govern intensity when exercising independently;Short  Term: Able to use daily as guideline for intensity in rehab       Able to check pulse independently Yes       Intervention Provide education and demonstration on how to check pulse in carotid and radial arteries.;Review the importance of being able to check your own pulse for safety during independent exercise       Expected Outcomes Short Term: Able to explain why pulse checking is important during independent exercise;Long Term: Able to check pulse independently and accurately       Understanding of Exercise Prescription Yes       Intervention Provide education, explanation, and written materials on patient's individual exercise prescription       Expected Outcomes Short Term: Able to explain program exercise prescription;Long Term: Able to explain home exercise prescription to exercise independently                Exercise Goals Re-Evaluation :  Exercise Goals Re-Evaluation     Row Name 07/10/23 0948             Exercise Goal Re-Evaluation   Exercise Goals Review Able to understand and use rate of perceived exertion (RPE) scale;Able to understand and use Dyspnea scale       Comments Reviewed RPE  and dyspnea scale, THR and program prescription with pt today.  Pt voiced understanding and was given a copy of goals to take home.       Expected Outcomes Short: Use RPE daily to regulate intensity.  Long: Follow program prescription in THR.                 Discharge Exercise Prescription (Final Exercise Prescription Changes):  Exercise Prescription Changes - 07/10/23 0900       Response to Exercise   Blood Pressure (Admit) 124/64    Blood Pressure (Exercise) 134/64    Blood Pressure (Exit) 118/66    Heart Rate (Admit) 78 bpm    Heart Rate (Exercise) 108 bpm    Heart Rate (Exit) 86 bpm    Oxygen Saturation (Admit) 98 %  Oxygen Saturation (Exercise) 97 %    Rating of Perceived Exertion (Exercise) 12    Perceived Dyspnea (Exercise) 1    Symptoms SOB, chronic bilateral  hip pain 7/10    Comments walk test results             Nutrition:  Target Goals: Understanding of nutrition guidelines, daily intake of sodium 1500mg , cholesterol 200mg , calories 30% from fat and 7% or less from saturated fats, daily to have 5 or more servings of fruits and vegetables.  Biometrics:  Pre Biometrics - 07/10/23 0947       Pre Biometrics   Height 5\' 5"  (1.651 m)    Weight 87.1 kg    Waist Circumference 36 inches    Hip Circumference 45 inches    Waist to Hip Ratio 0.8 %    BMI (Calculated) 31.95    Grip Strength 21.5 kg    Single Leg Stand 10.9 seconds              Nutrition Therapy Plan and Nutrition Goals:  Nutrition Therapy & Goals - 07/09/23 1201       Intervention Plan   Intervention Prescribe, educate and counsel regarding individualized specific dietary modifications aiming towards targeted core components such as weight, hypertension, lipid management, diabetes, heart failure and other comorbidities.;Nutrition handout(s) given to patient.    Expected Outcomes Short Term Goal: Understand basic principles of dietary content, such as calories, fat, sodium, cholesterol and nutrients.;Long Term Goal: Adherence to prescribed nutrition plan.             Nutrition Assessments:  MEDIFICTS Score Key: ?70 Need to make dietary changes  40-70 Heart Healthy Diet ? 40 Therapeutic Level Cholesterol Diet   Picture Your Plate Scores: <09 Unhealthy dietary pattern with much room for improvement. 41-50 Dietary pattern unlikely to meet recommendations for good health and room for improvement. 51-60 More healthful dietary pattern, with some room for improvement.  >60 Healthy dietary pattern, although there may be some specific behaviors that could be improved.    Nutrition Goals Re-Evaluation:   Nutrition Goals Discharge (Final Nutrition Goals Re-Evaluation):   Psychosocial: Target Goals: Acknowledge presence or absence of significant depression  and/or stress, maximize coping skills, provide positive support system. Participant is able to verbalize types and ability to use techniques and skills needed for reducing stress and depression.  Initial Review & Psychosocial Screening:  Initial Psych Review & Screening - 07/09/23 1151       Initial Review   Current issues with Current Psychotropic Meds;Current Anxiety/Panic;Current Depression;Current Stress Concerns    Source of Stress Concerns Chronic Illness;Family    Comments brother just passed (buried on 07/08/23), recovering from surgery (limited on driving still 30 min at a time)      Family Dynamics   Good Support System? Yes   husband     Barriers   Psychosocial barriers to participate in program The patient should benefit from training in stress management and relaxation.;Psychosocial barriers identified (see note)      Screening Interventions   Interventions Encouraged to exercise;To provide support and resources with identified psychosocial needs;Provide feedback about the scores to participant    Expected Outcomes Short Term goal: Utilizing psychosocial counselor, staff and physician to assist with identification of specific Stressors or current issues interfering with healing process. Setting desired goal for each stressor or current issue identified.;Long Term Goal: Stressors or current issues are controlled or eliminated.;Short Term goal: Identification and review with participant of any Quality of  Life or Depression concerns found by scoring the questionnaire.;Long Term goal: The participant improves quality of Life and PHQ9 Scores as seen by post scores and/or verbalization of changes             Quality of Life Scores:  Scores of 19 and below usually indicate a poorer quality of life in these areas.  A difference of  2-3 points is a clinically meaningful difference.  A difference of 2-3 points in the total score of the Quality of Life Index has been associated with  significant improvement in overall quality of life, self-image, physical symptoms, and general health in studies assessing change in quality of life.  PHQ-9: Review Flowsheet       07/10/2023  Depression screen PHQ 2/9  Decreased Interest 0  Down, Depressed, Hopeless 0  PHQ - 2 Score 0  Altered sleeping 0  Tired, decreased energy 0  Change in appetite 0  Feeling bad or failure about yourself  0  Trouble concentrating 0  Moving slowly or fidgety/restless 0  Suicidal thoughts 0  PHQ-9 Score 0  Difficult doing work/chores Not difficult at all    Details           Interpretation of Total Score  Total Score Depression Severity:  1-4 = Minimal depression, 5-9 = Mild depression, 10-14 = Moderate depression, 15-19 = Moderately severe depression, 20-27 = Severe depression   Psychosocial Evaluation and Intervention:  Psychosocial Evaluation - 07/09/23 1157       Psychosocial Evaluation & Interventions   Interventions Stress management education;Relaxation education;Encouraged to exercise with the program and follow exercise prescription    Comments Tangla is coming into 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  She is feeling good and excited to get going with program.  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 She just lost her brother last week and they buried him yesterday, but she is coping well with his death.  She does have a history of depression and anxiety, but feels well managed on her meds and knows they will work for her when she needs them.  Leading up to surgery, she did note that she got very clumbsy and had several falls.  She also slipped in kitchen yesterday while mopping floors.  She is still restricted to on her driving, but otherwise feeling good from surgical standpoint.  She sleeps well and enjoys woodworking in her shop to relax.    Expected Outcomes Short: Attend rehab  to build stamina Long: Continue to recover from surgery    Continue Psychosocial Services  Follow up required by staff             Psychosocial Re-Evaluation:   Psychosocial Discharge (Final Psychosocial Re-Evaluation):   Vocational Rehabilitation: Provide vocational rehab assistance to qualifying candidates.   Vocational Rehab Evaluation & Intervention:  Vocational Rehab - 07/09/23 1155       Initial Vocational Rehab Evaluation & Intervention   Assessment shows need for Vocational Rehabilitation No             Education: Education Goals: Education classes will be provided on a weekly basis, covering required topics. Participant will state understanding/return demonstration of topics presented.  Learning Barriers/Preferences:  Learning Barriers/Preferences - 07/09/23 1150       Learning Barriers/Preferences   Learning Barriers Sight   glasses   Learning Preferences None  Education Topics: Hypertension, Hypertension Reduction -Define heart disease and high blood pressure. Discus how high blood pressure affects the body and ways to reduce high blood pressure.   Exercise and Your Heart -Discuss why it is important to exercise, the FITT principles of exercise, normal and abnormal responses to exercise, and how to exercise safely.   Angina -Discuss definition of angina, causes of angina, treatment of angina, and how to decrease risk of having angina.   Cardiac Medications -Review what the following cardiac medications are used for, how they affect the body, and side effects that may occur when taking the medications.  Medications include Aspirin, Beta blockers, calcium channel blockers, ACE Inhibitors, angiotensin receptor blockers, diuretics, digoxin, and antihyperlipidemics.   Congestive Heart Failure -Discuss the definition of CHF, how to live with CHF, the signs and symptoms of CHF, and how keep track of weight and sodium intake.   Heart  Disease and Intimacy -Discus the effect sexual activity has on the heart, how changes occur during intimacy as we age, and safety during sexual activity.   Smoking Cessation / COPD -Discuss different methods to quit smoking, the health benefits of quitting smoking, and the definition of COPD.   Nutrition I: Fats -Discuss the types of cholesterol, what cholesterol does to the heart, and how cholesterol levels can be controlled.   Nutrition II: Labels -Discuss the different components of food labels and how to read food label   Heart Parts/Heart Disease and PAD -Discuss the anatomy of the heart, the pathway of blood circulation through the heart, and these are affected by heart disease.   Stress I: Signs and Symptoms -Discuss the causes of stress, how stress may lead to anxiety and depression, and ways to limit stress.   Stress II: Relaxation -Discuss different types of relaxation techniques to limit stress.   Warning Signs of Stroke / TIA -Discuss definition of a stroke, what the signs and symptoms are of a stroke, and how to identify when someone is having stroke.   Knowledge Questionnaire Score:   Core Components/Risk Factors/Patient Goals at Admission:  Personal Goals and Risk Factors at Admission - 07/10/23 0948       Core Components/Risk Factors/Patient Goals on Admission    Weight Management Yes;Obesity;Weight Loss    Intervention Weight Management: Develop a combined nutrition and exercise program designed to reach desired caloric intake, while maintaining appropriate intake of nutrient and fiber, sodium and fats, and appropriate energy expenditure required for the weight goal.;Weight Management: Provide education and appropriate resources to help participant work on and attain dietary goals.;Weight Management/Obesity: Establish reasonable short term and long term weight goals.;Obesity: Provide education and appropriate resources to help participant work on and attain  dietary goals.    Admit Weight 192 lb (87.1 kg)    Goal Weight: Short Term 187 lb (84.8 kg)    Goal Weight: Long Term 180 lb (81.6 kg)    Expected Outcomes Short Term: Continue to assess and modify interventions until short term weight is achieved;Long Term: Adherence to nutrition and physical activity/exercise program aimed toward attainment of established weight goal;Weight Loss: Understanding of general recommendations for a balanced deficit meal plan, which promotes 1-2 lb weight loss per week and includes a negative energy balance of [phone] kcal/d;Understanding recommendations for meals to include 15-35% energy as protein, 25-35% energy from fat, 35-60% energy from carbohydrates, less than 200mg  of dietary cholesterol, 20-35 gm of total fiber daily;Understanding of distribution of calorie intake throughout the day with the consumption of  4-5 meals/snacks    Diabetes Yes    Intervention Provide education about signs/symptoms and action to take for hypo/hyperglycemia.;Provide education about proper nutrition, including hydration, and aerobic/resistive exercise prescription along with prescribed medications to achieve blood glucose in normal ranges: Fasting glucose 65-99 mg/dL    Expected Outcomes Short Term: Participant verbalizes understanding of the signs/symptoms and immediate care of hyper/hypoglycemia, proper foot care and importance of medication, aerobic/resistive exercise and nutrition plan for blood glucose control.;Long Term: Attainment of HbA1C < 7%.    Hypertension Yes    Intervention Provide education on lifestyle modifcations including regular physical activity/exercise, weight management, moderate sodium restriction and increased consumption of fresh fruit, vegetables, and low fat dairy, alcohol moderation, and smoking cessation.;Monitor prescription use compliance.    Expected Outcomes Short Term: Continued assessment and intervention until BP is < 140/39mm HG in hypertensive  participants. < 130/51mm HG in hypertensive participants with diabetes, heart failure or chronic kidney disease.;Long Term: Maintenance of blood pressure at goal levels.    Lipids Yes    Intervention Provide education and support for participant on nutrition & aerobic/resistive exercise along with prescribed medications to achieve LDL 70mg , HDL >40mg .    Expected Outcomes Short Term: Participant states understanding of desired cholesterol values and is compliant with medications prescribed. Participant is following exercise prescription and nutrition guidelines.;Long Term: Cholesterol controlled with medications as prescribed, with individualized exercise RX and with personalized nutrition plan. Value goals: LDL < 70mg , HDL > 40 mg.             Core Components/Risk Factors/Patient Goals Review:    Core Components/Risk Factors/Patient Goals at Discharge (Final Review):    ITP Comments:  ITP Comments     Row Name 07/09/23 1203 07/10/23 0933         ITP Comments Completed virtual orientation today.  EP evaluation is scheduled for 07/10/23 at 830.  Documentation for diagnosis can be found in [facility] [facility] encounter 05/14/23 and OV 06/04/23. Patient attend orientation today.  Patient is attendingCardiac Rehabilitation Program.  Documentation for diagnosis can be found in [facility] 05/13/53.  Reviewed medical chart, RPE/RPD, gym safety, and program guidelines.  Patient was fitted to equipment they will be using during rehab.  Patient is scheduled to start exercise on 07/13/23.   Initial ITP created and sent for review and signature by Dr. Dina Rich, Medical Director for Cardiac Rehabilitation Program.               Comments: Initial ITP

## 2023-07-11 ENCOUNTER — Encounter (HOSPITAL_COMMUNITY): Payer: Self-pay | Admitting: *Deleted

## 2023-07-11 DIAGNOSIS — Z952 Presence of prosthetic heart valve: Secondary | ICD-10-CM

## 2023-07-11 NOTE — Progress Notes (Signed)
Cardiac Individual Treatment Plan  Patient Details  Name: Stacy Frost MRN: [id number] Date of Birth: 12/01/1960 Referring Provider:   Flowsheet Row CARDIAC REHAB PHASE II ORIENTATION from 07/10/2023 in [facility] CARDIAC REHABILITATION  Referring Provider Dietrich Pates MD       Initial Encounter Date:  Flowsheet Row CARDIAC REHAB PHASE II ORIENTATION from 07/10/2023 in [place] CARDIAC REHABILITATION  Date 07/10/23       Visit Diagnosis: S/P AVR (aortic valve replacement)  Patient's Home Medications on Admission:  Current Outpatient Medications:    aspirin EC 325 MG tablet, Take 1 tablet (325 mg total) by mouth daily., Disp: , Rfl:    atorvastatin (LIPITOR) 80 MG tablet, Take 1 tablet (80 mg total) by mouth daily., Disp: 30 tablet, Rfl: 5   celecoxib (CELEBREX) 200 MG capsule, Take 200 mg by mouth 2 (two) times daily., Disp: , Rfl:    citalopram (CELEXA) 40 MG tablet, Take 40 mg by mouth daily., Disp: , Rfl:    clonazePAM (KLONOPIN) 0.5 MG tablet, Take 0.5 mg by mouth 2 (two) times daily as needed., Disp: , Rfl:    Continuous Blood Gluc Receiver (DEXCOM G7 RECEIVER) DEVI, USE 1 receiver continuously, Disp: , Rfl:    Continuous Blood Gluc Sensor (DEXCOM G7 SENSOR) MISC, APPLY 1 SENSOR EVERY 10 DAYS, Disp: , Rfl:    cyclobenzaprine (FLEXERIL) 10 MG tablet, Take 10 mg by mouth 3 (three) times daily., Disp: , Rfl:    ferrous sulfate 325 (65 FE) MG tablet, Take 1 tablet (325 mg total) by mouth daily with breakfast., Disp: 60 tablet, Rfl: 1   furosemide (LASIX) 40 MG tablet, Take 1 tablet (40 mg total) by mouth daily., Disp: 7 tablet, Rfl: 0   metFORMIN (GLUCOPHAGE-XR) 500 MG 24 hr tablet, Take 2 tablets (1,000 mg total) by mouth daily with breakfast., Disp: 180 tablet, Rfl: 1   metoprolol (TOPROL-XL) 200 MG 24 hr tablet, Take 1 tablet (200 mg total) by mouth daily., Disp: 90 tablet, Rfl: 1   potassium chloride SA (KLOR-CON M) 20 MEQ tablet, Take 1 tablet (20 mEq total) by mouth daily.,  Disp: 90 tablet, Rfl: 1   pramipexole (MIRAPEX) 0.5 MG tablet, Take 0.5 mg by mouth at bedtime., Disp: , Rfl:    promethazine (PHENERGAN) 25 MG tablet, Take 25 mg by mouth 2 (two) times daily as needed., Disp: , Rfl:    tirzepatide (MOUNJARO) 7.5 MG/0.5ML Pen, Inject 7.5 mg into the skin once a week., Disp: 6 mL, Rfl: 1   traZODone (DESYREL) 100 MG tablet, Take 100 mg by mouth at bedtime., Disp: , Rfl:   Past Medical History: Past Medical History:  Diagnosis Date   Anxiety    Aortic stenosis    Mild to moderate   Bicuspid aortic valve    Essential hypertension, benign    GERD (gastroesophageal reflux disease)    History of cardiac catheterization    Normal coronaries 2008   Hyperlipidemia    Migraine headache    MVP (mitral valve prolapse)    Obesity    Pain in joint, ankle and foot    Chronic   Palpitations    Documented PACs by previous monitoring   Restless leg syndrome    Skin tag    Type 2 diabetes mellitus (HCC)    Unilateral primary osteoarthritis, right knee 09/06/2016    Tobacco Use: Social History   Tobacco Use  Smoking Status Never  Smokeless Tobacco Never    Labs: Review Flowsheet  More  data exists      Latest Ref Rng & Units 07/25/2022 11/29/2022 05/02/2023 05/13/2023 05/14/2023  Labs for ITP Cardiac and Pulmonary Rehab  Hemoglobin A1c 4.8 - 5.6 % 7.8  6.2  - 7.4  7.4   PH, Arterial 7.35 - 7.45 - - 7.376  - 7.351  7.336  7.378  7.436  7.405  7.389  7.385  7.5   PCO2 arterial 32 - 48 mmHg - - 35.7  - 37.6  41.5  41.5  36.3  41.5  43.6  46.1  37   Bicarbonate 20.0 - 28.0 mmol/L - - 22.4  22.3  20.9  - 20.7  22.1  24.5  24.4  26.1  27.9  26.3  27.6  28.9   TCO2 22 - 32 mmol/L - - 24  24  22   - 22  23  26  26  29  27  27  30  29  28  29  29  29    Acid-base deficit 0.0 - 2.0 mmol/L - - 3.0  3.0  4.0  - 4.0  3.0  1.0   O2 Saturation % - - 67  66  94  - 88  98  98  100  100  84  100  99  96.4     Details       Multiple values from one day are sorted in  reverse-chronological order         Capillary Blood Glucose: Lab Results  Component Value Date   GLUCAP 98 05/21/2023   GLUCAP 169 (H) 05/21/2023   GLUCAP 210 (H) 05/20/2023   GLUCAP 208 (H) 05/20/2023   GLUCAP 135 (H) 05/20/2023     Exercise Target Goals: Exercise Program Goal: Individual exercise prescription set using results from initial 6 min walk test and THRR while considering  patient's activity barriers and safety.   Exercise Prescription Goal: Starting with aerobic activity 30 plus minutes a day, 3 days per week for initial exercise prescription. Provide home exercise prescription and guidelines that participant acknowledges understanding prior to discharge.  Activity Barriers & Risk Stratification:  Activity Barriers & Cardiac Risk Stratification - 07/09/23 1150       Activity Barriers & Cardiac Risk Stratification   Activity Barriers Arthritis;Deconditioning;Muscular Weakness;Balance Concerns;History of Falls   bilateral hips arthritis   Cardiac Risk Stratification Moderate             6 Minute Walk:  6 Minute Walk     Row Name 07/10/23 0937         6 Minute Walk   Phase Initial     Distance 870 feet     Walk Time 6 minutes     MPH 1.65     METS 2.36     RPE 12     Perceived Dyspnea  1     VO2 Peak 8.25     Symptoms Yes (comment)     Comments SOB, chronic bilateral hip pain 7/10     Resting HR 78 bpm     Resting BP 124/64     Resting Oxygen Saturation  98 %     Exercise Oxygen Saturation  during 6 min walk 97 %     Max Ex. HR 108 bpm     Max Ex. BP 134/64     2 Minute Post BP 118/66              Oxygen Initial Assessment:   Oxygen Re-Evaluation:  Oxygen Discharge (Final Oxygen Re-Evaluation):   Initial Exercise Prescription:  Initial Exercise Prescription - 07/10/23 0900       Date of Initial Exercise RX and Referring Provider   Date 07/10/23    Referring Provider Dietrich Pates MD      Oxygen   Maintain Oxygen  Saturation 88% or higher      Treadmill   MPH 1.2    Grade 0.5    Minutes 15    METs 2      REL-XR   Level 1    Speed 50    Minutes 15    METs 2      Prescription Details   Frequency (times per week) 3    Duration Progress to 30 minutes of continuous aerobic without signs/symptoms of physical distress      Intensity   THRR 40-80% of Max Heartrate 110-141    Ratings of Perceived Exertion 11-13    Perceived Dyspnea 0-4      Progression   Progression Continue to progress workloads to maintain intensity without signs/symptoms of physical distress.      Resistance Training   Training Prescription Yes    Weight 3 lb    Reps 10-15             Perform Capillary Blood Glucose checks as needed.  Exercise Prescription Changes:   Exercise Prescription Changes     Row Name 07/10/23 0900             Response to Exercise   Blood Pressure (Admit) 124/64       Blood Pressure (Exercise) 134/64       Blood Pressure (Exit) 118/66       Heart Rate (Admit) 78 bpm       Heart Rate (Exercise) 108 bpm       Heart Rate (Exit) 86 bpm       Oxygen Saturation (Admit) 98 %       Oxygen Saturation (Exercise) 97 %       Rating of Perceived Exertion (Exercise) 12       Perceived Dyspnea (Exercise) 1       Symptoms SOB, chronic bilateral hip pain 7/10       Comments walk test results                Exercise Comments:   Exercise Goals and Review:   Exercise Goals     Row Name 07/10/23 0946             Exercise Goals   Increase Physical Activity Yes       Intervention Provide advice, education, support and counseling about physical activity/exercise needs.;Develop an individualized exercise prescription for aerobic and resistive training based on initial evaluation findings, risk stratification, comorbidities and participant's personal goals.       Expected Outcomes Short Term: Attend rehab on a regular basis to increase amount of physical activity.;Long Term: Add in  home exercise to make exercise part of routine and to increase amount of physical activity.;Long Term: Exercising regularly at least 3-5 days a week.       Increase Strength and Stamina Yes       Intervention Provide advice, education, support and counseling about physical activity/exercise needs.;Develop an individualized exercise prescription for aerobic and resistive training based on initial evaluation findings, risk stratification, comorbidities and participant's personal goals.       Expected Outcomes Short Term: Perform resistance training exercises routinely during rehab and add in resistance  training at home;Short Term: Increase workloads from initial exercise prescription for resistance, speed, and METs.;Long Term: Improve cardiorespiratory fitness, muscular endurance and strength as measured by increased METs and functional capacity ( )       Able to understand and use rate of perceived exertion (RPE) scale Yes       Intervention Provide education and explanation on how to use RPE scale       Expected Outcomes Short Term: Able to use RPE daily in rehab to express subjective intensity level;Long Term:  Able to use RPE to guide intensity level when exercising independently       Able to understand and use Dyspnea scale Yes       Intervention Provide education and explanation on how to use Dyspnea scale       Expected Outcomes Short Term: Able to use Dyspnea scale daily in rehab to express subjective sense of shortness of breath during exertion;Long Term: Able to use Dyspnea scale to guide intensity level when exercising independently       Knowledge and understanding of Target Heart Rate Range (THRR) Yes       Intervention Provide education and explanation of THRR including how the numbers were predicted and where they are located for reference       Expected Outcomes Short Term: Able to state/look up THRR;Long Term: Able to use THRR to govern intensity when exercising independently;Short  Term: Able to use daily as guideline for intensity in rehab       Able to check pulse independently Yes       Intervention Provide education and demonstration on how to check pulse in carotid and radial arteries.;Review the importance of being able to check your own pulse for safety during independent exercise       Expected Outcomes Short Term: Able to explain why pulse checking is important during independent exercise;Long Term: Able to check pulse independently and accurately       Understanding of Exercise Prescription Yes       Intervention Provide education, explanation, and written materials on patient's individual exercise prescription       Expected Outcomes Short Term: Able to explain program exercise prescription;Long Term: Able to explain home exercise prescription to exercise independently                Exercise Goals Re-Evaluation :  Exercise Goals Re-Evaluation     Row Name 07/10/23 0948             Exercise Goal Re-Evaluation   Exercise Goals Review Able to understand and use rate of perceived exertion (RPE) scale;Able to understand and use Dyspnea scale       Comments Reviewed RPE  and dyspnea scale, THR and program prescription with pt today.  Pt voiced understanding and was given a copy of goals to take home.       Expected Outcomes Short: Use RPE daily to regulate intensity.  Long: Follow program prescription in THR.                 Discharge Exercise Prescription (Final Exercise Prescription Changes):  Exercise Prescription Changes - 07/10/23 0900       Response to Exercise   Blood Pressure (Admit) 124/64    Blood Pressure (Exercise) 134/64    Blood Pressure (Exit) 118/66    Heart Rate (Admit) 78 bpm    Heart Rate (Exercise) 108 bpm    Heart Rate (Exit) 86 bpm    Oxygen Saturation (Admit) 98 %  Oxygen Saturation (Exercise) 97 %    Rating of Perceived Exertion (Exercise) 12    Perceived Dyspnea (Exercise) 1    Symptoms SOB, chronic bilateral  hip pain 7/10    Comments walk test results             Nutrition:  Target Goals: Understanding of nutrition guidelines, daily intake of sodium 1500mg , cholesterol 200mg , calories 30% from fat and 7% or less from saturated fats, daily to have 5 or more servings of fruits and vegetables.  Biometrics:  Pre Biometrics - 07/10/23 0947       Pre Biometrics   Height 5\' 5"  (1.651 m)    Weight 192 lb (87.1 kg)    Waist Circumference 36 inches    Hip Circumference 45 inches    Waist to Hip Ratio 0.8 %    BMI (Calculated) 31.95    Grip Strength 21.5 kg    Single Leg Stand 10.9 seconds              Nutrition Therapy Plan and Nutrition Goals:  Nutrition Therapy & Goals - 07/09/23 1201       Intervention Plan   Intervention Prescribe, educate and counsel regarding individualized specific dietary modifications aiming towards targeted core components such as weight, hypertension, lipid management, diabetes, heart failure and other comorbidities.;Nutrition handout(s) given to patient.    Expected Outcomes Short Term Goal: Understand basic principles of dietary content, such as calories, fat, sodium, cholesterol and nutrients.;Long Term Goal: Adherence to prescribed nutrition plan.             Nutrition Assessments:  MEDIFICTS Score Key: >=70 Need to make dietary changes  40-70 Heart Healthy Diet <= 40 Therapeutic Level Cholesterol Diet   Picture Your Plate Scores: <16 Unhealthy dietary pattern with much room for improvement. 41-50 Dietary pattern unlikely to meet recommendations for good health and room for improvement. 51-60 More healthful dietary pattern, with some room for improvement.  >60 Healthy dietary pattern, although there may be some specific behaviors that could be improved.    Nutrition Goals Re-Evaluation:   Nutrition Goals Discharge (Final Nutrition Goals Re-Evaluation):   Psychosocial: Target Goals: Acknowledge presence or absence of significant  depression and/or stress, maximize coping skills, provide positive support system. Participant is able to verbalize types and ability to use techniques and skills needed for reducing stress and depression.  Initial Review & Psychosocial Screening:  Initial Psych Review & Screening - 07/09/23 1151       Initial Review   Current issues with Current Psychotropic Meds;Current Anxiety/Panic;Current Depression;Current Stress Concerns    Source of Stress Concerns Chronic Illness;Family    Comments brother just passed (buried on 07/08/23), recovering from surgery (limited on driving still 30 min at a time)      Family Dynamics   Good Support System? Yes   husband     Barriers   Psychosocial barriers to participate in program The patient should benefit from training in stress management and relaxation.;Psychosocial barriers identified (see note)      Screening Interventions   Interventions Encouraged to exercise;To provide support and resources with identified psychosocial needs;Provide feedback about the scores to participant    Expected Outcomes Short Term goal: Utilizing psychosocial counselor, staff and physician to assist with identification of specific Stressors or current issues interfering with healing process. Setting desired goal for each stressor or current issue identified.;Long Term Goal: Stressors or current issues are controlled or eliminated.;Short Term goal: Identification and review with participant of any  Quality of Life or Depression concerns found by scoring the questionnaire.;Long Term goal: The participant improves quality of Life and PHQ9 Scores as seen by post scores and/or verbalization of changes             Quality of Life Scores:  Scores of 19 and below usually indicate a poorer quality of life in these areas.  A difference of  2-3 points is a clinically meaningful difference.  A difference of 2-3 points in the total score of the Quality of Life Index has been  associated with significant improvement in overall quality of life, self-image, physical symptoms, and general health in studies assessing change in quality of life.  PHQ-9: Review Flowsheet       07/10/2023  Depression screen PHQ 2/9  Decreased Interest 0  Down, Depressed, Hopeless 0  PHQ - 2 Score 0  Altered sleeping 0  Tired, decreased energy 0  Change in appetite 0  Feeling bad or failure about yourself  0  Trouble concentrating 0  Moving slowly or fidgety/restless 0  Suicidal thoughts 0  PHQ-9 Score 0  Difficult doing work/chores Not difficult at all    Details           Interpretation of Total Score  Total Score Depression Severity:  1-4 = Minimal depression, 5-9 = Mild depression, 10-14 = Moderate depression, 15-19 = Moderately severe depression, 20-27 = Severe depression   Psychosocial Evaluation and Intervention:  Psychosocial Evaluation - 07/09/23 1157       Psychosocial Evaluation & Interventions   Interventions Stress management education;Relaxation education;Encouraged to exercise with the program and follow exercise prescription    Comments Zikia is coming into 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  She is feeling good and excited to get going with program.  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 She just lost her brother last week and they buried him yesterday, but she is coping well with his death.  She does have a history of depression and anxiety, but feels well managed on her meds and knows they will work for her when she needs them.  Leading up to surgery, she did note that she got very clumbsy and had several falls.  She also slipped in kitchen yesterday while mopping floors.  She is still restricted to on her driving, but otherwise feeling good from surgical standpoint.  She sleeps well and enjoys woodworking in her shop to relax.    Expected Outcomes  Short: Attend rehab to build stamina Long: Continue to recover from surgery    Continue Psychosocial Services  Follow up required by staff             Psychosocial Re-Evaluation:   Psychosocial Discharge (Final Psychosocial Re-Evaluation):   Vocational Rehabilitation: Provide vocational rehab assistance to qualifying candidates.   Vocational Rehab Evaluation & Intervention:  Vocational Rehab - 07/09/23 1155       Initial Vocational Rehab Evaluation & Intervention   Assessment shows need for Vocational Rehabilitation No             Education: Education Goals: Education classes will be provided on a weekly basis, covering required topics. Participant will state understanding/return demonstration of topics presented.  Learning Barriers/Preferences:  Learning Barriers/Preferences - 07/09/23 1150       Learning Barriers/Preferences   Learning Barriers Sight   glasses   Learning  Preferences None             Education Topics: Hypertension, Hypertension Reduction -Define heart disease and high blood pressure. Discus how high blood pressure affects the body and ways to reduce high blood pressure.   Exercise and Your Heart -Discuss why it is important to exercise, the FITT principles of exercise, normal and abnormal responses to exercise, and how to exercise safely.   Angina -Discuss definition of angina, causes of angina, treatment of angina, and how to decrease risk of having angina.   Cardiac Medications -Review what the following cardiac medications are used for, how they affect the body, and side effects that may occur when taking the medications.  Medications include Aspirin, Beta blockers, calcium channel blockers, ACE Inhibitors, angiotensin receptor blockers, diuretics, digoxin, and antihyperlipidemics.   Congestive Heart Failure -Discuss the definition of CHF, how to live with CHF, the signs and symptoms of CHF, and how keep track of weight and sodium  intake.   Heart Disease and Intimacy -Discus the effect sexual activity has on the heart, how changes occur during intimacy as we age, and safety during sexual activity.   Smoking Cessation / COPD -Discuss different methods to quit smoking, the health benefits of quitting smoking, and the definition of COPD.   Nutrition I: Fats -Discuss the types of cholesterol, what cholesterol does to the heart, and how cholesterol levels can be controlled.   Nutrition II: Labels -Discuss the different components of food labels and how to read food label   Heart Parts/Heart Disease and PAD -Discuss the anatomy of the heart, the pathway of blood circulation through the heart, and these are affected by heart disease.   Stress I: Signs and Symptoms -Discuss the causes of stress, how stress may lead to anxiety and depression, and ways to limit stress.   Stress II: Relaxation -Discuss different types of relaxation techniques to limit stress.   Warning Signs of Stroke / TIA -Discuss definition of a stroke, what the signs and symptoms are of a stroke, and how to identify when someone is having stroke.   Knowledge Questionnaire Score:   Core Components/Risk Factors/Patient Goals at Admission:  Personal Goals and Risk Factors at Admission - 07/10/23 0948       Core Components/Risk Factors/Patient Goals on Admission    Weight Management Yes;Obesity;Weight Loss    Intervention Weight Management: Develop a combined nutrition and exercise program designed to reach desired caloric intake, while maintaining appropriate intake of nutrient and fiber, sodium and fats, and appropriate energy expenditure required for the weight goal.;Weight Management: Provide education and appropriate resources to help participant work on and attain dietary goals.;Weight Management/Obesity: Establish reasonable short term and long term weight goals.;Obesity: Provide education and appropriate resources to help participant  work on and attain dietary goals.    Admit Weight 192 lb (87.1 kg)    Goal Weight: Short Term 187 lb (84.8 kg)    Goal Weight: Long Term 180 lb (81.6 kg)    Expected Outcomes Short Term: Continue to assess and modify interventions until short term weight is achieved;Long Term: Adherence to nutrition and physical activity/exercise program aimed toward attainment of established weight goal;Weight Loss: Understanding of general recommendations for a balanced deficit meal plan, which promotes 1-2 lb weight loss per week and includes a negative energy balance of [phone] kcal/d;Understanding recommendations for meals to include 15-35% energy as protein, 25-35% energy from fat, 35-60% energy from carbohydrates, less than 200mg  of dietary cholesterol, 20-35 gm of total  fiber daily;Understanding of distribution of calorie intake throughout the day with the consumption of 4-5 meals/snacks    Diabetes Yes    Intervention Provide education about signs/symptoms and action to take for hypo/hyperglycemia.;Provide education about proper nutrition, including hydration, and aerobic/resistive exercise prescription along with prescribed medications to achieve blood glucose in normal ranges: Fasting glucose 65-99 mg/dL    Expected Outcomes Short Term: Participant verbalizes understanding of the signs/symptoms and immediate care of hyper/hypoglycemia, proper foot care and importance of medication, aerobic/resistive exercise and nutrition plan for blood glucose control.;Long Term: Attainment of HbA1C < 7%.    Hypertension Yes    Intervention Provide education on lifestyle modifcations including regular physical activity/exercise, weight management, moderate sodium restriction and increased consumption of fresh fruit, vegetables, and low fat dairy, alcohol moderation, and smoking cessation.;Monitor prescription use compliance.    Expected Outcomes Short Term: Continued assessment and intervention until BP is < 140/77mm HG in  hypertensive participants. < 130/20mm HG in hypertensive participants with diabetes, heart failure or chronic kidney disease.;Long Term: Maintenance of blood pressure at goal levels.    Lipids Yes    Intervention Provide education and support for participant on nutrition & aerobic/resistive exercise along with prescribed medications to achieve LDL 70mg , HDL >40mg .    Expected Outcomes Short Term: Participant states understanding of desired cholesterol values and is compliant with medications prescribed. Participant is following exercise prescription and nutrition guidelines.;Long Term: Cholesterol controlled with medications as prescribed, with individualized exercise RX and with personalized nutrition plan. Value goals: LDL < 70mg , HDL > 40 mg.             Core Components/Risk Factors/Patient Goals Review:    Core Components/Risk Factors/Patient Goals at Discharge (Final Review):    ITP Comments:  ITP Comments     Row Name 07/09/23 1203 07/10/23 0933 07/11/23 0755       ITP Comments Completed virtual orientation today.  EP evaluation is scheduled for 07/10/23 at 830.  Documentation for diagnosis can be found in [facility] encounter 05/14/23 and OV 06/04/23. Patient attend orientation today.  Patient is attendingCardiac Rehabilitation Program.  Documentation for diagnosis can be found in [facility] 05/13/53.  Reviewed medical chart, RPE/RPD, gym safety, and program guidelines.  Patient was fitted to equipment they will be using during rehab.  Patient is scheduled to start exercise on 07/13/23.   Initial ITP created and sent for review and signature by Dr. Dina Rich, Medical Director for Cardiac Rehabilitation Program. 30 day review completed. ITP sent to Dr. Dina Rich, Medical Director of Cardiac Rehab. Continue with ITP unless changes are made by physician.  Pt just oriented 07/11/23.              Comments: 30 day review

## 2023-07-13 ENCOUNTER — Encounter (HOSPITAL_COMMUNITY)
Admission: RE | Admit: 2023-07-13 | Discharge: 2023-07-13 | Disposition: A | Discharge status: Home / Self Care | Payer: 59 | Source: Ambulatory Visit | Attending: Internal Medicine | Admitting: Internal Medicine

## 2023-07-13 DIAGNOSIS — Z952 Presence of prosthetic heart valve: Secondary | ICD-10-CM

## 2023-07-13 LAB — GLUCOSE, CAPILLARY
Glucose-Capillary: 222 mg/dL — ABNORMAL HIGH (ref 70–99)
Glucose-Capillary: 184 mg/dL — ABNORMAL HIGH (ref 70–99)

## 2023-07-13 NOTE — Progress Notes (Signed)
Daily Session Note  Patient Details  Name: Stacy Frost MRN: [id number] Date of Birth: 1961/05/05 Referring Provider:   Flowsheet Row CARDIAC REHAB PHASE II ORIENTATION from 07/10/2023 in [facility] CARDIAC REHABILITATION  Referring Provider Dietrich Pates MD       Encounter Date: 07/13/2023  Check In:  Session Check In - 07/13/23 0915       Check-In   Supervising physician immediately available to respond to emergencies See telemetry face sheet for immediately available MD    Location [facility]-Card[facility] Pul[facility] Re[facility]    Staff Present Ross Ludwig, BS, Exercise Physiologist;Zonnie Landen Daphine Deutscher, RN, BSN;Jessica Hawkins, MA, RCEP, CCRP, CCET    Virtual Visit No    Medication changes reported     No    Fall or balance concerns reported    No    Warm-up and Cool-down Performed on first and last piece of equipment    Resistance Training Performed Yes      Pain Assessment   Currently in Pain? No/denies             Capillary Blood Glucose: Results for orders placed or performed during the hospital encounter of 07/13/23 (from the past 24 hour(s))  Glucose, capillary     Status: Abnormal   Collection Time: 07/13/23  9:09 AM  Result Value Ref Range   Glucose-Capillary 222 (H) 70 - 99 mg/dL      Social History   Tobacco Use  Smoking Status Never  Smokeless Tobacco Never    Goals Met:  Independence with exercise equipment Exercise tolerated well No report of concerns or symptoms today Strength training completed today  Goals Unmet:  Not Applicable  Comments: Pt able to follow exercise prescription today without complaint.  Will continue to monitor for progression.    Dr. Dina Rich is Medical Director for [facility] Cardiac Rehab

## 2023-07-16 ENCOUNTER — Encounter (HOSPITAL_COMMUNITY)
Admission: RE | Admit: 2023-07-16 | Discharge: 2023-07-16 | Disposition: A | Discharge status: Home / Self Care | Payer: 59 | Source: Ambulatory Visit | Attending: Internal Medicine | Admitting: Internal Medicine

## 2023-07-16 DIAGNOSIS — Z952 Presence of prosthetic heart valve: Secondary | ICD-10-CM | POA: Diagnosis not present

## 2023-07-16 LAB — GLUCOSE, CAPILLARY
Glucose-Capillary: 174 mg/dL — ABNORMAL HIGH (ref 70–99)
Glucose-Capillary: 165 mg/dL — ABNORMAL HIGH (ref 70–99)

## 2023-07-16 NOTE — Progress Notes (Addendum)
Daily Session Note  Patient Details  Name: Stacy Frost MRN: [id number] Date of Birth: Jan 05, 1961 Referring Provider:   Flowsheet Row CARDIAC REHAB PHASE II ORIENTATION from 07/10/2023 in [facility] CARDIAC REHABILITATION  Referring Provider Dietrich Pates MD       Encounter Date: 07/16/2023  Check In:  Session Check In - 07/16/23 0900       Check-In   Supervising physician immediately available to respond to emergencies See telemetry face sheet for immediately available ER MD    Location [facility]-Cardiac [facility] Pul[facility]    Staff Present Fabio Pierce, MA, RCEP, CCRP, Dow Adolph, RN, BSN;Heather Fredric Mare, [place], Exercise Physiologist    Virtual Visit No    Medication changes reported     No    Fall or balance concerns reported    Yes    Comments Patient says she fell at home this weekend and scrapped her leg.    Warm-up and Cool-down Performed on first and last piece of equipment    Resistance Training Performed Yes    VAD Patient? No    PAD/SET Patient? No      Pain Assessment   Currently in Pain? No/denies    Multiple Pain Sites No             Capillary Blood Glucose: Results for orders placed or performed during the hospital encounter of 07/16/23 (from the past 24 hour(s))  Glucose, capillary     Status: Abnormal   Collection Time: 07/16/23 10:07 AM  Result Value Ref Range   Glucose-Capillary 165 (H) 70 - 99 mg/dL      Social History   Tobacco Use  Smoking Status Never  Smokeless Tobacco Never    Goals Met:  Independence with exercise equipment Exercise tolerated well No report of concerns or symptoms today Strength training completed today  Goals Unmet:  Not Applicable  Comments: Pt able to follow exercise prescription today without complaint.  Will continue to monitor for progression.     Dr. Dina Rich is Medical Director for [facility] Cardiac Rehab

## 2023-07-17 ENCOUNTER — Ambulatory Visit: Payer: 59 | Attending: Nurse Practitioner | Admitting: Nurse Practitioner

## 2023-07-17 ENCOUNTER — Encounter: Payer: Self-pay | Admitting: Nurse Practitioner

## 2023-07-17 VITALS — BP 124/70 | HR 76 | Ht 65.0 in | Wt 189.8 lb

## 2023-07-17 DIAGNOSIS — E785 Hyperlipidemia, unspecified: Secondary | ICD-10-CM

## 2023-07-17 DIAGNOSIS — I10 Essential (primary) hypertension: Secondary | ICD-10-CM | POA: Diagnosis not present

## 2023-07-17 DIAGNOSIS — R42 Dizziness and giddiness: Secondary | ICD-10-CM

## 2023-07-17 DIAGNOSIS — I5032 Chronic diastolic (congestive) heart failure: Secondary | ICD-10-CM

## 2023-07-17 DIAGNOSIS — R296 Repeated falls: Secondary | ICD-10-CM

## 2023-07-17 DIAGNOSIS — Z952 Presence of prosthetic heart valve: Secondary | ICD-10-CM

## 2023-07-17 DIAGNOSIS — R600 Localized edema: Secondary | ICD-10-CM

## 2023-07-17 MED ORDER — FUROSEMIDE 40 MG PO TABS: 40.0000 mg | ORAL_TABLET | Freq: Every day | ORAL | 5 refills | Status: DC | PRN

## 2023-07-17 MED ORDER — POTASSIUM CHLORIDE CRYS ER 20 MEQ PO TBCR: 20.0000 meq | EXTENDED_RELEASE_TABLET | Freq: Every day | ORAL | Status: DC | PRN

## 2023-07-17 MED ORDER — METOPROLOL SUCCINATE ER 100 MG PO TB24: 100.0000 mg | ORAL_TABLET | Freq: Every day | ORAL | 1 refills | Status: DC

## 2023-07-17 MED ORDER — COMPRESSION BANDAGES KIT: 1.0000 | PACK | Freq: Every day | 0 refills | Status: DC | PRN

## 2023-07-17 NOTE — Patient Instructions (Addendum)
Medication Instructions:  Your physician has recommended you make the following change in your medication:  Reduce Metoprolol to 100 Mg daily  Start taking Lasix 40 Mg Daily as needed for swelling and weight gain, also take your potassium supplement daily as needed along with your Lasix.  Continue all other medications as prescribed   Labwork: None  Testing/Procedures: None   Follow-Up: Your physician recommends that you schedule a follow-up appointment in: 2-3 Months   Any Other Special Instructions Will Be Listed Below (If Applicable).  If you need a refill on your cardiac medications before your next appointment, please call your pharmacy.

## 2023-07-17 NOTE — Addendum Note (Signed)
Addended by: Sharlene Dory on: 07/17/2023 01:23 PM   Modules accepted: Orders

## 2023-07-17 NOTE — Progress Notes (Signed)
Cardiology Office Note:  .   Date:  07/17/2023  ID:  Stacy Frost, DOB 06-17-1961, MRN [id number] PCP: Kirstie Peri, MD  [facility] Providers Cardiologist:  Nona Dell, MD    History of Present Illness: .   Stacy Frost is a 62 y.o. female with a PMH of severe aortic stenosis due to bicuspid aortic valve, s/p AVR, type 2 diabetes, dyslipidemia, hypertension, obesity, and migraine headaches, who presents today for post AVR follow-up.   Was initially referred to Dr. Cliffton Asters for evaluation for severe aortic stenosis.  She was admitted for suffering syncopal episodes at home while using bathroom.  Required urgent admission and surgical intervention.  Underwent AVR with 25 mm [employer] Inspiris Bovine pericardial tissue valve on 05/14/2023.  She was diuresed routinely postprocedure due to expected volume excess.  She also had expected acute blood loss anemia, required transfusion with 1 unit PRBC.  I last saw her for AVR follow-up on 06/04/2023. Was doing well, did note loss of appetite and nausea.   Today she presents for follow-up. Has been doing cardiac rehab, however admits to dizziness (has been ongoing prior to surgery) and shows me abrasions along right lower leg from where she has fallen, also has bruise noted to left knee from another fall. Dizziness is triggered with rapid head movements, denies any orthostatic symptoms. Denies any chest pain, shortness of breath, palpitations, syncope, presyncope, orthopnea, PND, significant weight changes, acute bleeding, or claudication. Has some leg edema noticed later in the day.   Studies Reviewed: Marland Kitchen    TTE 05/2023:  1. Left ventricular ejection fraction, by estimation, is 60 to 65%. The  left ventricle has normal function. The left ventricle has no regional  wall motion abnormalities. There is moderate left ventricular hypertrophy.  Left ventricular diastolic  parameters are consistent with Grade I diastolic dysfunction  (impaired  relaxation).   2. Right ventricular systolic function is normal. The right ventricular  size is normal. There is normal pulmonary artery systolic pressure.   3. The mitral valve is normal in structure. No evidence of mitral valve  regurgitation. No evidence of mitral stenosis.   4. The tricuspid valve is abnormal.   5. 25mm Inspiris Valve is in the AV position. . The aortic valve has been  repaired/replaced. Aortic valve regurgitation is not visualized. No aortic  stenosis is present.   6. The inferior vena cava is normal in size with greater than 50%  respiratory variability, suggesting right atrial pressure of 3 mmHg.  TEE 04/2023:  Complications: No known complications during this procedure.  POST-OP IMPRESSIONS  _ Left Ventricle: The left ventricle is unchanged from pre-bypass.  _ Right Ventricle: The right ventricle appears unchanged from pre-bypass.  _ Aortic Valve: A bioprosthetic bioprosthetic valve was placed, leaflets  are  freely mobile and leaflets thin. Manufactured by; inspiris Size; 25mm.  There is  no regurgitation or stenosis of the new aortic valve.  _ Mitral Valve: The mitral valve appears unchanged from pre-bypass.   PRE-OP FINDINGS   Left Ventricle: The left ventricle has normal systolic function, with an  ejection fraction of 55-60%. The cavity size was normal. There is mildly  increased left ventricular wall thickness. There is mild concentric left  ventricular hypertrophy.  R/LHC 04/2023:  Normal coronary anatomy Normal LV filling pressure. PCWP 19/22 mean 13 mm Hg Normal right heart pressures. PAP 31/13 with mean 22 mm Hg Normal cardiac output 5.54 L/min, index 2.90   Plan: valve team assessment for  AVR     Physical Exam:   VS:  BP 124/70   Pulse 76   Ht 5\' 5"  (1.651 m)   Wt 189 lb 12.8 oz (86.1 kg)   SpO2 97%   BMI 31.58 kg/m    Wt Readings from Last 3 Encounters:  07/17/23 189 lb 12.8 oz (86.1 kg)  07/10/23 192 lb (87.1 kg)   06/21/23 186 lb 3.2 oz (84.5 kg)    Orthostatic vital signs:  Lying: 136, heart rate 75 Sitting: 115/74, heart rate 76 Standing: 115/75, heart rate 77 Standing at 3 minutes: 133/79, heart rate 76   GEN: Obese, 62 y.o. female in no acute distress NECK: No JVD; No carotid bruits CARDIAC: S1/S2, RRR, no murmurs, rubs, gallops RESPIRATORY:  Clear to auscultation without rales, wheezing or rhonchi  ABDOMEN: Soft, non-tender, non-distended EXTREMITIES:  No edema, several abrasions noted to RLE, bruise to left kneecap; No deformity   ASSESSMENT AND PLAN: .    AS, s/p AVR Recovering well after surgery.  Echocardiogram performed last month showed normal valvular function. SBE prophylaxis discussed and she verbalized understanding.  Continue current medication regimen.  Continue cardiac rehab.  Heart healthy diet and regular cardiovascular exercise encouraged.     2.  HFpEF Stage C, NYHA class I symptoms. Recent TTE revealed EF 60-65%. Euvolemic and well compensated on exam.  Take Lasix and potassium daily PRN for swelling, weight gain, or shortness of breath. Will reduce Toprol-XL to 100 mg daily.  Low sodium diet, fluid restriction <2L, and daily weights encouraged. Educated to contact our office for weight gain of 2 lbs overnight or 5 lbs in one week.  Hesitant to uptitrate medication at this time with current dizziness.   3. HTN BP stable. Discussed to monitor BP at home at least 2 hours after medications and sitting for 5-10 minutes.  Reducing Toprol-XL to 100 mg daily.  Heart healthy diet encouraged.   4. Dyslipidemia She has pending future labs with endocrinology.  Continue atorvastatin. Heart healthy diet encouraged.   5. Dizziness, hx of multiple falls Etiology sounds like vertigo, however unclear.  Orthostatics did reveal drop in SBP from lying to sitting at/standing, was not hypotensive.  Will reduce Toprol-XL to 100 mg daily.  Recommended compression stockings (will write Rx) and  staying well-hydrated.  Also discussed changing positions slowly.  Will change Lasix and potassium to daily as needed dosing. Continue to follow with PCP.  6. Leg edema This is most likely due to being in the setting of chronic venous insufficiency.  No edema noted on exam today.  Will write Rx for compression stockings as mentioned above.  Will change Lasix and potassium to daily as needed dosing.  Dispo: Follow-up with me or APP in 2-3 months or sooner if anything changes.  Signed, Sharlene Dory, NP

## 2023-07-18 ENCOUNTER — Encounter (HOSPITAL_COMMUNITY)
Admission: RE | Admit: 2023-07-18 | Discharge: 2023-07-18 | Disposition: A | Discharge status: Home / Self Care | Payer: 59 | Source: Ambulatory Visit | Attending: Internal Medicine | Admitting: Internal Medicine

## 2023-07-18 DIAGNOSIS — Z952 Presence of prosthetic heart valve: Secondary | ICD-10-CM

## 2023-07-18 LAB — GLUCOSE, CAPILLARY
Glucose-Capillary: 193 mg/dL — ABNORMAL HIGH (ref 70–99)
Glucose-Capillary: 173 mg/dL — ABNORMAL HIGH (ref 70–99)

## 2023-07-18 NOTE — Progress Notes (Signed)
Daily Session Note  Patient Details  Name: Stacy Frost MRN: [id number] Date of Birth: 08-05-1961 Referring Provider:   Flowsheet Row CARDIAC REHAB PHASE II ORIENTATION from 07/10/2023 in [facility] CARDIAC REHABILITATION  Referring Provider Dietrich Pates MD       Encounter Date: 07/18/2023  Check In:  Session Check In - 07/18/23 0915       Check-In   Supervising physician immediately available to respond to emergencies See telemetry face sheet for immediately available MD    Location [facility]-Cardiac [facility] Pul[facility] Re[facility]    Staff Present Ross Ludwig, BS, Exercise Physiologist;Hillary Fairfield BSN, RN;Jessica Windsor, MA, RCEP, CCRP, CCET    Virtual Visit No    Medication changes reported     No    Fall or balance concerns reported    Yes    Comments Patient says she fell at home and scrapped her leg.    Tobacco Cessation No Change    Warm-up and Cool-down Performed on first and last piece of equipment    Resistance Training Performed Yes    VAD Patient? No    PAD/SET Patient? No      Pain Assessment   Currently in Pain? No/denies    Multiple Pain Sites No             Capillary Blood Glucose: Results for orders placed or performed during the hospital encounter of 07/18/23 (from the past 24 hour(s))  Glucose, capillary     Status: Abnormal   Collection Time: 07/18/23  9:23 AM  Result Value Ref Range   Glucose-Capillary 193 (H) 70 - 99 mg/dL      Social History   Tobacco Use  Smoking Status Never  Smokeless Tobacco Never    Goals Met:  Independence with exercise equipment Exercise tolerated well No report of concerns or symptoms today Strength training completed today  Goals Unmet:  Not Applicable  Comments: Pt able to follow exercise prescription today without complaint.  Will continue to monitor for progression.    Dr. Dina Rich is Medical Director for [facility] Cardiac Rehab

## 2023-07-20 ENCOUNTER — Encounter (HOSPITAL_COMMUNITY)
Admission: RE | Admit: 2023-07-20 | Discharge: 2023-07-20 | Disposition: A | Discharge status: Home / Self Care | Payer: 59 | Source: Ambulatory Visit | Attending: Internal Medicine

## 2023-07-20 ENCOUNTER — Telehealth: Payer: Self-pay | Admitting: Nurse Practitioner

## 2023-07-20 DIAGNOSIS — Z952 Presence of prosthetic heart valve: Secondary | ICD-10-CM | POA: Diagnosis not present

## 2023-07-20 NOTE — Progress Notes (Signed)
Daily Session Note  Patient Details  Name: Stacy Frost MRN: [id number] Date of Birth: 1961/05/25 Referring Provider:   Flowsheet Row CARDIAC REHAB PHASE II ORIENTATION from 07/10/2023 in [facility] CARDIAC REHABILITATION  Referring Provider Dietrich Pates MD       Encounter Date: 07/20/2023  Check In:  Session Check In - 07/20/23 0915       Check-In   Supervising physician immediately available to respond to emergencies See telemetry face sheet for immediately available MD    Location [facility]-Cardiac [facility] Pul[facility]    Staff Present Ross Ludwig, BS, Exercise Physiologist;Daphyne Daphine Deutscher, RN, BSN;Jessica Hawkins, MA, RCEP, CCRP, CCET    Virtual Visit No    Medication changes reported     No    Fall or balance concerns reported    Yes    Comments Patient says she fell at home and scrapped her leg.    Tobacco Cessation No Change    Warm-up and Cool-down Performed on first and last piece of equipment    Resistance Training Performed Yes    VAD Patient? No    PAD/SET Patient? No      Pain Assessment   Currently in Pain? No/denies    Multiple Pain Sites No             Capillary Blood Glucose: No results found for this or any previous visit (from the past 24 hour(s)).    Social History   Tobacco Use  Smoking Status Never  Smokeless Tobacco Never    Goals Met:  Independence with exercise equipment Exercise tolerated well No report of concerns or symptoms today Strength training completed today  Goals Unmet:  Not Applicable  Comments: Pt able to follow exercise prescription today without complaint.  Will continue to monitor for progression.    Dr. Dina Rich is Medical Director for [facility] Cardiac Rehab

## 2023-07-20 NOTE — Telephone Encounter (Signed)
Pt came in office requesting a note from [place] to return to work. Pt was seen Wednesday.

## 2023-07-20 NOTE — Telephone Encounter (Signed)
Advised that her work note is deferred to Cardiothoracic office and she should reach out to them for any changes to her work note that was provided for her on 06/28/2023. Verbalized understanding of plan.

## 2023-07-20 NOTE — Telephone Encounter (Signed)
Pt stated she came in for her office visit this week and forgot to mention that she needs a doctor note to go back to work with no restrictions. Please advise

## 2023-07-23 ENCOUNTER — Encounter (HOSPITAL_COMMUNITY)
Admission: RE | Admit: 2023-07-23 | Discharge: 2023-07-23 | Disposition: A | Discharge status: Home / Self Care | Payer: 59 | Source: Ambulatory Visit | Attending: Internal Medicine

## 2023-07-23 DIAGNOSIS — Z952 Presence of prosthetic heart valve: Secondary | ICD-10-CM

## 2023-07-23 NOTE — Progress Notes (Signed)
Daily Session Note  Patient Details  Name: Stacy Frost MRN: [id number] Date of Birth: 1961-04-10 Referring Provider:   Flowsheet Row CARDIAC REHAB PHASE II ORIENTATION from 07/10/2023 in [facility] CARDIAC REHABILITATION  Referring Provider Dietrich Pates MD       Encounter Date: 07/23/2023  Check In:  Session Check In - 07/23/23 0915       Check-In   Supervising physician immediately available to respond to emergencies See telemetry face sheet for immediately available MD    Location [facility]    Staff Present Ross Ludwig, BS, Exercise Physiologist;Debra Laural Benes, RN, BSN;Gaberiel Youngblood, RN;Daphyne Daphine Deutscher, RN, BSN;Jessica Hawkins, MA, RCEP, CCRP, CCET    Virtual Visit No    Medication changes reported     No    Fall or balance concerns reported    Yes    Comments Patient says she fell at home and scrapped her leg.    Tobacco Cessation No Change    Warm-up and Cool-down Performed on first and last piece of equipment    Resistance Training Performed Yes    VAD Patient? No    PAD/SET Patient? No      Pain Assessment   Currently in Pain? No/denies    Multiple Pain Sites No             Capillary Blood Glucose: No results found for this or any previous visit (from the past 24 hour(s)).    Social History   Tobacco Use  Smoking Status Never  Smokeless Tobacco Never    Goals Met:  Independence with exercise equipment Exercise tolerated well No report of concerns or symptoms today Strength training completed today  Goals Unmet:  Not Applicable  Comments: Pt able to follow exercise prescription today without complaint.  Will continue to monitor for progression.    Dr. Dina Rich is Medical Director for [facility] Cardiac Rehab

## 2023-07-23 NOTE — Progress Notes (Signed)
I have reviewed a Home Exercise Prescription with [name] . Myrtha is not currently exercising at home.  The patient was advised to walk 2-5 days a week for 30-45 minutes.  Misty Stanley and I discussed how to progress their exercise prescription.  The patient stated that their goals were to build her endurace and strength.  The patient stated that they understand the exercise prescription.  We reviewed exercise guidelines, target heart rate during exercise, RPE Scale, weather conditions, NTG use, endpoints for exercise, warmup and cool down.  Patient is encouraged to come to me with any questions. I will continue to follow up with the patient to assist them with progression and safety.

## 2023-07-25 ENCOUNTER — Encounter (HOSPITAL_COMMUNITY)
Admission: RE | Admit: 2023-07-25 | Discharge: 2023-07-25 | Disposition: A | Discharge status: Home / Self Care | Payer: 59 | Source: Ambulatory Visit | Attending: Internal Medicine | Admitting: Internal Medicine

## 2023-07-25 DIAGNOSIS — Z952 Presence of prosthetic heart valve: Secondary | ICD-10-CM

## 2023-07-25 NOTE — Progress Notes (Signed)
Daily Session Note  Patient Details  Name: Stacy Frost MRN: [id number] Date of Birth: [birth year] Referring Provider:   Flowsheet Row CARDIAC REHAB PHASE II ORIENTATION from 07/10/2023 in [facility] CARDIAC REHABILITATION  Referring Provider Dietrich Pates MD       Encounter Date: 07/25/2023  Check In:  Session Check In - 07/25/23 0915       Check-In   Supervising physician immediately available to respond to emergencies [facility] MD immediately available    Physician(s) Dr. Wyline Mood    Location [facility]-Cardiac [facility] Pulmonary Rehab    Staff Present Ross Ludwig, BS, Exercise Physiologist;Jessica Juanetta Gosling, MA, RCEP, CCRP, CCET;Keyonni Percival BSN, RN    Virtual Visit No    Medication changes reported     No    Fall or balance concerns reported    Yes    Comments Patient says she fell at home and scrapped her leg.    Tobacco Cessation No Change    Warm-up and Cool-down Performed on first and last piece of equipment    Resistance Training Performed Yes    VAD Patient? No    PAD/SET Patient? No      Pain Assessment   Currently in Pain? No/denies    Multiple Pain Sites No             Capillary Blood Glucose: No results found for this or any previous visit (from the past 24 hour(s)).    Social History   Tobacco Use  Smoking Status Never  Smokeless Tobacco Never    Goals Met:  Independence with exercise equipment Exercise tolerated well No report of concerns or symptoms today Strength training completed today  Goals Unmet:  Not Applicable  Comments: Marland KitchenMarland KitchenPt able to follow exercise prescription today without complaint.  Will continue to monitor for progression.    Dr. Dina Rich is Medical Director for [facility] Cardiac Rehab

## 2023-07-27 ENCOUNTER — Encounter (HOSPITAL_COMMUNITY)
Admission: RE | Admit: 2023-07-27 | Discharge: 2023-07-27 | Disposition: A | Discharge status: Home / Self Care | Payer: 59 | Source: Ambulatory Visit | Attending: Internal Medicine | Admitting: Internal Medicine

## 2023-07-27 DIAGNOSIS — Z952 Presence of prosthetic heart valve: Secondary | ICD-10-CM

## 2023-07-27 NOTE — Progress Notes (Signed)
Daily Session Note  Patient Details  Name: Stacy Frost MRN: [id number] Date of Birth: 08-08-1961 Referring Provider:   Flowsheet Row CARDIAC REHAB PHASE II ORIENTATION from 07/10/2023 in [facility] CARDIAC REHABILITATION  Referring Provider Dietrich Pates MD       Encounter Date: 07/27/2023  Check In:  Session Check In - 07/27/23 0915       Check-In   Supervising physician immediately available to respond to emergencies See telemetry face sheet for immediately available MD    Location [facility]-Card[facility] Pul[facility]    Staff Present Ross Ludwig, BS, Exercise Physiologist;Debra Laural Benes, RN, BSN;Jessica [place]lands, MA, RCEP, CCRP, CCET;Phyllis Billingsley, RN    Virtual Visit No    Medication changes reported     No    Fall or balance concerns reported    Yes    Comments Patient says she fell at home and scrapped her leg.    Tobacco Cessation No Change    Warm-up and Cool-down Performed on first and last piece of equipment    Resistance Training Performed Yes    VAD Patient? No    PAD/SET Patient? No      Pain Assessment   Currently in Pain? No/denies    Multiple Pain Sites No             Capillary Blood Glucose: No results found for this or any previous visit (from the past 24 hour(s)).    Social History   Tobacco Use  Smoking Status Never  Smokeless Tobacco Never    Goals Met:  Independence with exercise equipment Exercise tolerated well No report of concerns or symptoms today Strength training completed today  Goals Unmet:  Not Applicable  Comments: Pt able to follow exercise prescription today without complaint.  Will continue to monitor for progression.    Dr. Dina Rich is Medical Director for [facility] Cardiac Rehab

## 2023-07-30 ENCOUNTER — Encounter (HOSPITAL_COMMUNITY): Payer: 59

## 2023-08-01 ENCOUNTER — Encounter (HOSPITAL_COMMUNITY): Payer: 59

## 2023-08-02 ENCOUNTER — Telehealth: Payer: Self-pay | Admitting: Nurse Practitioner

## 2023-08-02 NOTE — Telephone Encounter (Signed)
Spoke with Summer at [facility] advised her that patient is currently on 100 mg daily of Metoprolol

## 2023-08-02 NOTE — Telephone Encounter (Signed)
Pt c/o medication issue:  1. Name of Medication: Metoprolol  2. How are you currently taking this medication (dosage and times per day)?   3. Are you having a reaction (difficulty breathing--STAT)?   4. What is your medication issue? Summer is calling from [employer] wants to know the correct dose of patient's Metoprolol

## 2023-08-03 ENCOUNTER — Encounter (HOSPITAL_COMMUNITY): Payer: 59

## 2023-08-06 ENCOUNTER — Encounter (HOSPITAL_COMMUNITY)
Admission: RE | Admit: 2023-08-06 | Discharge: 2023-08-06 | Disposition: A | Discharge status: Home / Self Care | Payer: 59 | Source: Ambulatory Visit | Attending: Internal Medicine | Admitting: Internal Medicine

## 2023-08-06 DIAGNOSIS — Z952 Presence of prosthetic heart valve: Secondary | ICD-10-CM | POA: Diagnosis present

## 2023-08-06 NOTE — Progress Notes (Signed)
Daily Session Note  Patient Details  Name: Stacy Frost MRN: [id number] Date of Birth: Apr 20, 1961 Referring Provider:   Flowsheet Row CARDIAC REHAB PHASE II ORIENTATION from 07/10/2023 in [facility] CARDIAC REHABILITATION  Referring Provider Dietrich Pates MD       Encounter Date: 08/06/2023  Check In:  Session Check In - 08/06/23 0900       Check-In   Supervising physician immediately available to respond to emergencies See telemetry face sheet for immediately available ER MD    Location [facility]    Staff Present Rodena Medin, RN, BSN;Jessica [place], MA, RCEP, CCRP, Harolyn Rutherford, RN, BSN    Virtual Visit No    Medication changes reported     No    Fall or balance concerns reported    Yes    Comments Patient reports falling at home this morning due to dizziness. No injuries reported. She said her dizziness had subsided at check-in. No treadmill today.    Warm-up and Cool-down Performed on first and last piece of equipment    Resistance Training Performed Yes    VAD Patient? No    PAD/SET Patient? No      Pain Assessment   Currently in Pain? No/denies    Multiple Pain Sites No             Capillary Blood Glucose: No results found for this or any previous visit (from the past 24 hour(s)).    Social History   Tobacco Use  Smoking Status Never  Smokeless Tobacco Never    Goals Met:  Independence with exercise equipment Exercise tolerated well No report of concerns or symptoms today Strength training completed today  Goals Unmet:  Not Applicable  Comments: Pt able to follow exercise prescription today without complaint.  Will continue to monitor for progression.    Dr. Dina Rich is Medical Director for [facility]

## 2023-08-08 ENCOUNTER — Encounter (HOSPITAL_COMMUNITY): Payer: Self-pay | Admitting: *Deleted

## 2023-08-08 ENCOUNTER — Encounter (HOSPITAL_COMMUNITY)
Admission: RE | Admit: 2023-08-08 | Discharge: 2023-08-08 | Disposition: A | Discharge status: Home / Self Care | Payer: 59 | Source: Ambulatory Visit | Attending: Internal Medicine | Admitting: Internal Medicine

## 2023-08-08 DIAGNOSIS — Z952 Presence of prosthetic heart valve: Secondary | ICD-10-CM

## 2023-08-08 NOTE — Progress Notes (Signed)
Cardiac Individual Treatment Plan  Patient Details  Name: Stacy Frost MRN: [id number] Date of Birth: December 10, 1960 Referring Provider:   Flowsheet Row CARDIAC REHAB PHASE II ORIENTATION from 07/10/2023 in [facility] CARDIAC REHABILITATION  Referring Provider Dietrich Pates MD       Initial Encounter Date:  Flowsheet Row CARDIAC REHAB PHASE II ORIENTATION from 07/10/2023 in [place] CARDIAC REHABILITATION  Date 07/10/23       Visit Diagnosis: S/P AVR (aortic valve replacement)  Patient's Home Medications on Admission:  Current Outpatient Medications:    aspirin EC 325 MG tablet, Take 1 tablet (325 mg total) by mouth daily., Disp: , Rfl:    atorvastatin (LIPITOR) 80 MG tablet, Take 1 tablet (80 mg total) by mouth daily., Disp: 30 tablet, Rfl: 5   celecoxib (CELEBREX) 200 MG capsule, Take 200 mg by mouth 2 (two) times daily., Disp: , Rfl:    citalopram (CELEXA) 40 MG tablet, Take 40 mg by mouth daily., Disp: , Rfl:    clonazePAM (KLONOPIN) 0.5 MG tablet, Take 0.5 mg by mouth 2 (two) times daily as needed., Disp: , Rfl:    Compression Bandages KIT, 1 each by Does not apply route daily as needed., Disp: 1 kit, Rfl: 0   Continuous Blood Gluc Receiver (DEXCOM G7 RECEIVER) DEVI, USE 1 receiver continuously, Disp: , Rfl:    Continuous Blood Gluc Sensor (DEXCOM G7 SENSOR) MISC, APPLY 1 SENSOR EVERY 10 DAYS, Disp: , Rfl:    cyclobenzaprine (FLEXERIL) 10 MG tablet, Take 10 mg by mouth 3 (three) times daily., Disp: , Rfl:    ferrous sulfate 325 (65 FE) MG tablet, Take 1 tablet (325 mg total) by mouth daily with breakfast., Disp: 60 tablet, Rfl: 1   furosemide (LASIX) 40 MG tablet, Take 1 tablet (40 mg total) by mouth daily as needed (for swelling and weight gain)., Disp: 30 tablet, Rfl: 5   metFORMIN (GLUCOPHAGE-XR) 500 MG 24 hr tablet, Take 2 tablets (1,000 mg total) by mouth daily with breakfast., Disp: 180 tablet, Rfl: 1   metoprolol (TOPROL-XL) 100 MG 24 hr tablet, Take 1 tablet (100 mg total) by  mouth daily., Disp: 90 tablet, Rfl: 1   potassium chloride SA (KLOR-CON M) 20 MEQ tablet, Take 1 tablet (20 mEq total) by mouth daily as needed., Disp: , Rfl:    pramipexole (MIRAPEX) 0.5 MG tablet, Take 0.5 mg by mouth at bedtime., Disp: , Rfl:    promethazine (PHENERGAN) 25 MG tablet, Take 25 mg by mouth 2 (two) times daily as needed., Disp: , Rfl:    tirzepatide (MOUNJARO) 7.5 MG/0.5ML Pen, Inject 7.5 mg into the skin once a week., Disp: 6 mL, Rfl: 1   traZODone (DESYREL) 100 MG tablet, Take 100 mg by mouth at bedtime., Disp: , Rfl:   Past Medical History: Past Medical History:  Diagnosis Date   Anxiety    Aortic stenosis    Mild to moderate   Bicuspid aortic valve    Essential hypertension, benign    GERD (gastroesophageal reflux disease)    History of cardiac catheterization    Normal coronaries 2008   Hyperlipidemia    Migraine headache    MVP (mitral valve prolapse)    Obesity    Pain in joint, ankle and foot    Chronic   Palpitations    Documented PACs by previous monitoring   Restless leg syndrome    Skin tag    Type 2 diabetes mellitus (HCC)    Unilateral primary osteoarthritis, right knee  09/06/2016    Tobacco Use: Social History   Tobacco Use  Smoking Status Never  Smokeless Tobacco Never    Labs: Review Flowsheet  More data exists      Latest Ref Rng & Units 07/25/2022 11/29/2022 05/02/2023 05/13/2023 05/14/2023  Labs for ITP Cardiac and Pulmonary Rehab  Hemoglobin A1c 4.8 - 5.6 % 7.8  6.2  - 7.4  7.4   PH, Arterial 7.35 - 7.45 - - 7.376  - 7.351  7.336  7.378  7.436  7.405  7.389  7.385  7.5   PCO2 arterial 32 - 48 mmHg - - 35.7  - 37.6  41.5  41.5  36.3  41.5  43.6  46.1  37   Bicarbonate 20.0 - 28.0 mmol/L - - 22.4  22.3  20.9  - 20.7  22.1  24.5  24.4  26.1  27.9  26.3  27.6  28.9   TCO2 22 - 32 mmol/L - - 24  24  22   - 22  23  26  26  29  27  27  30  29  28  29  29  29    Acid-base deficit 0.0 - 2.0 mmol/L - - 3.0  3.0  4.0  - 4.0  3.0  1.0   O2  Saturation % - - 67  66  94  - 88  98  98  100  100  84  100  99  96.4     Details       Multiple values from one day are sorted in reverse-chronological order         Capillary Blood Glucose: Lab Results  Component Value Date   GLUCAP 173 (H) 07/18/2023   GLUCAP 193 (H) 07/18/2023   GLUCAP 165 (H) 07/16/2023   GLUCAP 174 (H) 07/16/2023   GLUCAP 184 (H) 07/13/2023     Exercise Target Goals: Exercise Program Goal: Individual exercise prescription set using results from initial 6 min walk test and THRR while considering  patient's activity barriers and safety.   Exercise Prescription Goal: Starting with aerobic activity 30 plus minutes a day, 3 days per week for initial exercise prescription. Provide home exercise prescription and guidelines that participant acknowledges understanding prior to discharge.  Activity Barriers & Risk Stratification:  Activity Barriers & Cardiac Risk Stratification - 07/09/23 1150       Activity Barriers & Cardiac Risk Stratification   Activity Barriers Arthritis;Deconditioning;Muscular Weakness;Balance Concerns;History of Falls   bilateral hips arthritis   Cardiac Risk Stratification Moderate             6 Minute Walk:  6 Minute Walk     Row Name 07/10/23 0937         6 Minute Walk   Phase Initial     Distance 870 feet     Walk Time 6 minutes     MPH 1.65     METS 2.36     RPE 12     Perceived Dyspnea  1     VO2 Peak 8.25     Symptoms Yes (comment)     Comments SOB, chronic bilateral hip pain 7/10     Resting HR 78 bpm     Resting BP 124/64     Resting Oxygen Saturation  98 %     Exercise Oxygen Saturation  during 6 min walk 97 %     Max Ex. HR 108 bpm     Max Ex. BP 134/64  2 Minute Post BP 118/66              Oxygen Initial Assessment:   Oxygen Re-Evaluation:   Oxygen Discharge (Final Oxygen Re-Evaluation):   Initial Exercise Prescription:  Initial Exercise Prescription - 07/10/23 0900       Date  of Initial Exercise RX and Referring Provider   Date 07/10/23    Referring Provider Dietrich Pates MD      Oxygen   Maintain Oxygen Saturation 88% or higher      Treadmill   MPH 1.2    Grade 0.5    Minutes 15    METs 2      REL-XR   Level 1    Speed 50    Minutes 15    METs 2      Prescription Details   Frequency (times per week) 3    Duration Progress to 30 minutes of continuous aerobic without signs/symptoms of physical distress      Intensity   THRR 40-80% of Max Heartrate 110-141    Ratings of Perceived Exertion 11-13    Perceived Dyspnea 0-4      Progression   Progression Continue to progress workloads to maintain intensity without signs/symptoms of physical distress.      Resistance Training   Training Prescription Yes    Weight 3 lb    Reps 10-15             Perform Capillary Blood Glucose checks as needed.  Exercise Prescription Changes:   Exercise Prescription Changes     Row Name 07/10/23 0900 07/23/23 0900 07/23/23 1400         Response to Exercise   Blood Pressure (Admit) 124/64 -- 124/78     Blood Pressure (Exercise) 134/64 -- 182/80     Blood Pressure (Exit) 118/66 -- 128/80     Heart Rate (Admit) 78 bpm -- 87 bpm     Heart Rate (Exercise) 108 bpm -- 115 bpm     Heart Rate (Exit) 86 bpm -- 96 bpm     Oxygen Saturation (Admit) 98 % -- --     Oxygen Saturation (Exercise) 97 % -- --     Rating of Perceived Exertion (Exercise) 12 -- 15     Perceived Dyspnea (Exercise) 1 -- --     Symptoms SOB, chronic bilateral hip pain 7/10 -- --     Comments walk test results -- --     Duration -- -- Continue with 30 min of aerobic exercise without signs/symptoms of physical distress.     Intensity -- -- THRR unchanged       Progression   Progression -- -- Continue to progress workloads to maintain intensity without signs/symptoms of physical distress.       Resistance Training   Training Prescription -- -- Yes     Weight -- -- 3 lbs     Reps -- --  10-15     Time -- -- 10 Minutes       Treadmill   MPH -- -- 1.9     Grade -- -- 0.5     Minutes -- -- 15     METs -- -- 2.59       REL-XR   Level -- -- 3     Speed -- -- 47     Minutes -- -- 15     METs -- -- 2.5       Home Exercise Plan   Plans to  continue exercise at -- Home (comment) Home (comment)     Frequency -- Add 2 additional days to program exercise sessions. Add 2 additional days to program exercise sessions.     Initial Home Exercises Provided -- 07/23/23 --       Oxygen   Maintain Oxygen Saturation -- -- 88% or higher              Exercise Comments:   Exercise Goals and Review:   Exercise Goals     Row Name 07/10/23 0946             Exercise Goals   Increase Physical Activity Yes       Intervention Provide advice, education, support and counseling about physical activity/exercise needs.;Develop an individualized exercise prescription for aerobic and resistive training based on initial evaluation findings, risk stratification, comorbidities and participant's personal goals.       Expected Outcomes Short Term: Attend rehab on a regular basis to increase amount of physical activity.;Long Term: Add in home exercise to make exercise part of routine and to increase amount of physical activity.;Long Term: Exercising regularly at least 3-5 days a week.       Increase Strength and Stamina Yes       Intervention Provide advice, education, support and counseling about physical activity/exercise needs.;Develop an individualized exercise prescription for aerobic and resistive training based on initial evaluation findings, risk stratification, comorbidities and participant's personal goals.       Expected Outcomes Short Term: Perform resistance training exercises routinely during rehab and add in resistance training at home;Short Term: Increase workloads from initial exercise prescription for resistance, speed, and METs.;Long Term: Improve cardiorespiratory fitness,  muscular endurance and strength as measured by increased METs and functional capacity ( )       Able to understand and use rate of perceived exertion (RPE) scale Yes       Intervention Provide education and explanation on how to use RPE scale       Expected Outcomes Short Term: Able to use RPE daily in rehab to express subjective intensity level;Long Term:  Able to use RPE to guide intensity level when exercising independently       Able to understand and use Dyspnea scale Yes       Intervention Provide education and explanation on how to use Dyspnea scale       Expected Outcomes Short Term: Able to use Dyspnea scale daily in rehab to express subjective sense of shortness of breath during exertion;Long Term: Able to use Dyspnea scale to guide intensity level when exercising independently       Knowledge and understanding of Target Heart Rate Range (THRR) Yes       Intervention Provide education and explanation of THRR including how the numbers were predicted and where they are located for reference       Expected Outcomes Short Term: Able to state/look up THRR;Long Term: Able to use THRR to govern intensity when exercising independently;Short Term: Able to use daily as guideline for intensity in rehab       Able to check pulse independently Yes       Intervention Provide education and demonstration on how to check pulse in carotid and radial arteries.;Review the importance of being able to check your own pulse for safety during independent exercise       Expected Outcomes Short Term: Able to explain why pulse checking is important during independent exercise;Long Term: Able to check pulse independently and accurately  Understanding of Exercise Prescription Yes       Intervention Provide education, explanation, and written materials on patient's individual exercise prescription       Expected Outcomes Short Term: Able to explain program exercise prescription;Long Term: Able to explain home  exercise prescription to exercise independently                Exercise Goals Re-Evaluation :  Exercise Goals Re-Evaluation     Row Name 07/10/23 0948 07/20/23 0934 07/23/23 1416         Exercise Goal Re-Evaluation   Exercise Goals Review Able to understand and use rate of perceived exertion (RPE) scale;Able to understand and use Dyspnea scale Increase Physical Activity;Increase Strength and Stamina;Understanding of Exercise Prescription Increase Physical Activity;Increase Strength and Stamina;Understanding of Exercise Prescription     Comments Reviewed RPE  and dyspnea scale, THR and program prescription with pt today.  Pt voiced understanding and was given a copy of goals to take home. Shalandra is doing great in rehab. She starting to enjoying coming to rehab and exercising. She has started to increase her levels on both sets of equipment. She is starting to notice an increase of her endurance and stamina. Zuriel is tolerating exercise well. She has increased her speed on the treadmill from the start of rehab to 1.9. She has also increased her level on the XR to level 3. Will continue to monitor and progress as able.     Expected Outcomes Short: Use RPE daily to regulate intensity.  Long: Follow program prescription in THR. Short : continue to increasing levels  long term: continue to attend rehab and enjoy coming. Short term: Continue to exercsie at workloads until RPE is 11 then increase    long term: continue to attend rehab               Discharge Exercise Prescription (Final Exercise Prescription Changes):  Exercise Prescription Changes - 07/23/23 1400       Response to Exercise   Blood Pressure (Admit) 124/78    Blood Pressure (Exercise) 182/80    Blood Pressure (Exit) 128/80    Heart Rate (Admit) 87 bpm    Heart Rate (Exercise) 115 bpm    Heart Rate (Exit) 96 bpm    Rating of Perceived Exertion (Exercise) 15    Duration Continue with 30 min of aerobic exercise without  signs/symptoms of physical distress.    Intensity THRR unchanged      Progression   Progression Continue to progress workloads to maintain intensity without signs/symptoms of physical distress.      Resistance Training   Training Prescription Yes    Weight 3 lbs    Reps 10-15    Time 10 Minutes      Treadmill   MPH 1.9    Grade 0.5    Minutes 15    METs 2.59      REL-XR   Level 3    Speed 47    Minutes 15    METs 2.5      Home Exercise Plan   Plans to continue exercise at Home (comment)    Frequency Add 2 additional days to program exercise sessions.      Oxygen   Maintain Oxygen Saturation 88% or higher             Nutrition:  Target Goals: Understanding of nutrition guidelines, daily intake of sodium 1500mg , cholesterol 200mg , calories 30% from fat and 7% or less from saturated fats, daily  to have 5 or more servings of fruits and vegetables.  Biometrics:  Pre Biometrics - 07/10/23 0947       Pre Biometrics   Height 5\' 5"  (1.651 m)    Weight 192 lb (87.1 kg)    Waist Circumference 36 inches    Hip Circumference 45 inches    Waist to Hip Ratio 0.8 %    BMI (Calculated) 31.95    Grip Strength 21.5 kg    Single Leg Stand 10.9 seconds              Nutrition Therapy Plan and Nutrition Goals:  Nutrition Therapy & Goals - 07/09/23 1201       Intervention Plan   Intervention Prescribe, educate and counsel regarding individualized specific dietary modifications aiming towards targeted core components such as weight, hypertension, lipid management, diabetes, heart failure and other comorbidities.;Nutrition handout(s) given to patient.    Expected Outcomes Short Term Goal: Understand basic principles of dietary content, such as calories, fat, sodium, cholesterol and nutrients.;Long Term Goal: Adherence to prescribed nutrition plan.             Nutrition Assessments:  MEDIFICTS Score Key: >=70 Need to make dietary changes  40-70 Heart Healthy  Diet <= 40 Therapeutic Level Cholesterol Diet  Flowsheet Row CARDIAC REHAB PHASE II EXERCISE from 07/16/2023 in [facility] CARDIAC REHABILITATION  Picture Your Plate Total Score on Admission 52      Picture Your Plate Scores: <16 Unhealthy dietary pattern with much room for improvement. 41-50 Dietary pattern unlikely to meet recommendations for good health and room for improvement. 51-60 More healthful dietary pattern, with some room for improvement.  >60 Healthy dietary pattern, although there may be some specific behaviors that could be improved.    Nutrition Goals Re-Evaluation:  Nutrition Goals Re-Evaluation     Row Name 07/23/23 [phone]             Goals   Nutrition Goal Healthy eating       Comment Heavin is stated that she has been happy with what she is eating. During the week she stated that they do eat more red meat during the week (ground beef). Her sides are fires in the air fryer, baked potatos, green beans and peas. She said that it verires during the season and week what side they eat. She does eat a lot of sweets during the week and it does mess with her surgar. She drinks zero surgar sodas and when she drinks water it is with flavoring packet.       Expected Outcome Short term: cut out night time sweets   long term: focus on healthy eating                Nutrition Goals Discharge (Final Nutrition Goals Re-Evaluation):  Nutrition Goals Re-Evaluation - 07/23/23 0923       Goals   Nutrition Goal Healthy eating    Comment Celester is stated that she has been happy with what she is eating. During the week she stated that they do eat more red meat during the week (ground beef). Her sides are fires in the air fryer, baked potatos, green beans and peas. She said that it verires during the season and week what side they eat. She does eat a lot of sweets during the week and it does mess with her surgar. She drinks zero surgar sodas and when she drinks water it is with flavoring  packet.    Expected Outcome Short  term: cut out night time sweets   long term: focus on healthy eating             Psychosocial: Target Goals: Acknowledge presence or absence of significant depression and/or stress, maximize coping skills, provide positive support system. Participant is able to verbalize types and ability to use techniques and skills needed for reducing stress and depression.  Initial Review & Psychosocial Screening:  Initial Psych Review & Screening - 07/09/23 1151       Initial Review   Current issues with Current Psychotropic Meds;Current Anxiety/Panic;Current Depression;Current Stress Concerns    Source of Stress Concerns Chronic Illness;Family    Comments brother just passed (buried on 07/08/23), recovering from surgery (limited on driving still 30 min at a time)      Family Dynamics   Good Support System? Yes   husband     Barriers   Psychosocial barriers to participate in program The patient should benefit from training in stress management and relaxation.;Psychosocial barriers identified (see note)      Screening Interventions   Interventions Encouraged to exercise;To provide support and resources with identified psychosocial needs;Provide feedback about the scores to participant    Expected Outcomes Short Term goal: Utilizing psychosocial counselor, staff and physician to assist with identification of specific Stressors or current issues interfering with healing process. Setting desired goal for each stressor or current issue identified.;Long Term Goal: Stressors or current issues are controlled or eliminated.;Short Term goal: Identification and review with participant of any Quality of Life or Depression concerns found by scoring the questionnaire.;Long Term goal: The participant improves quality of Life and PHQ9 Scores as seen by post scores and/or verbalization of changes             Quality of Life Scores:  Quality of Life - 07/17/23 0818        Quality of Life   Select Quality of Life      Quality of Life Scores   Health/Function Pre 20.25 %    Socioeconomic Pre 25.08 %    Psych/Spiritual Pre 24.36 %    Family Pre 21.6 %    GLOBAL Pre 22.27 %            Scores of 19 and below usually indicate a poorer quality of life in these areas.  A difference of  2-3 points is a clinically meaningful difference.  A difference of 2-3 points in the total score of the Quality of Life Index has been associated with significant improvement in overall quality of life, self-image, physical symptoms, and general health in studies assessing change in quality of life.  PHQ-9: Review Flowsheet       07/10/2023  Depression screen PHQ 2/9  Decreased Interest 0  Down, Depressed, Hopeless 0  PHQ - 2 Score 0  Altered sleeping 0  Tired, decreased energy 0  Change in appetite 0  Feeling bad or failure about yourself  0  Trouble concentrating 0  Moving slowly or fidgety/restless 0  Suicidal thoughts 0  PHQ-9 Score 0  Difficult doing work/chores Not difficult at all    Details           Interpretation of Total Score  Total Score Depression Severity:  1-4 = Minimal depression, 5-9 = Mild depression, 10-14 = Moderate depression, 15-19 = Moderately severe depression, 20-27 = Severe depression   Psychosocial Evaluation and Intervention:  Psychosocial Evaluation - 07/09/23 1157       Psychosocial Evaluation & Interventions   Interventions  Stress management education;Relaxation education;Encouraged to exercise with the program and follow exercise prescription    Comments Mirtha is coming into 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  She is feeling good and excited to get going with program.  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 She just lost her brother last week and they buried him yesterday, but she is coping well with his death.  She does have a  history of depression and anxiety, but feels well managed on her meds and knows they will work for her when she needs them.  Leading up to surgery, she did note that she got very clumbsy and had several falls.  She also slipped in kitchen yesterday while mopping floors.  She is still restricted to on her driving, but otherwise feeling good from surgical standpoint.  She sleeps well and enjoys woodworking in her shop to relax.    Expected Outcomes Short: Attend rehab to build stamina Long: Continue to recover from surgery    Continue Psychosocial Services  Follow up required by staff             Psychosocial Re-Evaluation:  Psychosocial Re-Evaluation     Row Name 07/20/23 [phone]             Psychosocial Re-Evaluation   Current issues with None Identified       Comments Halina has been doing great in rehab. She stated that she has been sleeping good and getting good night sleep. She does not have any major stressor in her life she stated. She is enjoying life and being with her family and grandkid       Expected Outcomes Short term: continue to do do things she enjoys   long term: continue to have no stressors.       Interventions Encouraged to attend Cardiac Rehabilitation for the exercise;Stress management education;Relaxation education       Continue Psychosocial Services  Follow up required by staff                Psychosocial Discharge (Final Psychosocial Re-Evaluation):  Psychosocial Re-Evaluation - 07/20/23 0949       Psychosocial Re-Evaluation   Current issues with None Identified    Comments Darenda has been doing great in rehab. She stated that she has been sleeping good and getting good night sleep. She does not have any major stressor in her life she stated. She is enjoying life and being with her family and grandkid    Expected Outcomes Short term: continue to do do things she enjoys   long term: continue to have no stressors.    Interventions Encouraged to attend  Cardiac Rehabilitation for the exercise;Stress management education;Relaxation education    Continue Psychosocial Services  Follow up required by staff             Vocational Rehabilitation: Provide vocational rehab assistance to qualifying candidates.   Vocational Rehab Evaluation & Intervention:  Vocational Rehab - 07/09/23 1155       Initial Vocational Rehab Evaluation & Intervention   Assessment shows need for Vocational Rehabilitation No             Education: Education Goals: Education classes will be provided on a weekly basis, covering required topics. Participant will state understanding/return demonstration of topics presented.  Learning Barriers/Preferences:  Learning Barriers/Preferences - 07/09/23 1150  Learning Barriers/Preferences   Learning Barriers Sight   glasses   Learning Preferences None             Education Topics: Hypertension, Hypertension Reduction -Define heart disease and high blood pressure. Discus how high blood pressure affects the body and ways to reduce high blood pressure.   Exercise and Your Heart -Discuss why it is important to exercise, the FITT principles of exercise, normal and abnormal responses to exercise, and how to exercise safely. Flowsheet Row CARDIAC REHAB PHASE II EXERCISE from 07/25/2023 in [place] CARDIAC REHABILITATION  Date 07/25/23  Educator [facility]  Instruction Review Code 1- Verbalizes Understanding       Angina -Discuss definition of angina, causes of angina, treatment of angina, and how to decrease risk of having angina. Flowsheet Row CARDIAC REHAB PHASE II EXERCISE from 07/25/2023 in [place] CARDIAC REHABILITATION  Date 07/18/23  Educator [facility]  Instruction Review Code 1- Verbalizes Understanding  [testing and procedures]       Cardiac Medications -Review what the following cardiac medications are used for, how they affect the body, and side effects that may occur when taking the  medications.  Medications include Aspirin, Beta blockers, calcium channel blockers, ACE Inhibitors, angiotensin receptor blockers, diuretics, digoxin, and antihyperlipidemics.   Congestive Heart Failure -Discuss the definition of CHF, how to live with CHF, the signs and symptoms of CHF, and how keep track of weight and sodium intake.   Heart Disease and Intimacy -Discus the effect sexual activity has on the heart, how changes occur during intimacy as we age, and safety during sexual activity.   Smoking Cessation / COPD -Discuss different methods to quit smoking, the health benefits of quitting smoking, and the definition of COPD.   Nutrition I: Fats -Discuss the types of cholesterol, what cholesterol does to the heart, and how cholesterol levels can be controlled.   Nutrition II: Labels -Discuss the different components of food labels and how to read food label   Heart Parts/Heart Disease and PAD -Discuss the anatomy of the heart, the pathway of blood circulation through the heart, and these are affected by heart disease.   Stress I: Signs and Symptoms -Discuss the causes of stress, how stress may lead to anxiety and depression, and ways to limit stress.   Stress II: Relaxation -Discuss different types of relaxation techniques to limit stress.   Warning Signs of Stroke / TIA -Discuss definition of a stroke, what the signs and symptoms are of a stroke, and how to identify when someone is having stroke.   Knowledge Questionnaire Score:  Knowledge Questionnaire Score - 07/17/23 0819       Knowledge Questionnaire Score   Pre Score 22/28             Core Components/Risk Factors/Patient Goals at Admission:  Personal Goals and Risk Factors at Admission - 07/10/23 0948       Core Components/Risk Factors/Patient Goals on Admission    Weight Management Yes;Obesity;Weight Loss    Intervention Weight Management: Develop a combined nutrition and exercise program designed  to reach desired caloric intake, while maintaining appropriate intake of nutrient and fiber, sodium and fats, and appropriate energy expenditure required for the weight goal.;Weight Management: Provide education and appropriate resources to help participant work on and attain dietary goals.;Weight Management/Obesity: Establish reasonable short term and long term weight goals.;Obesity: Provide education and appropriate resources to help participant work on and attain dietary goals.    Admit Weight 192 lb (87.1 kg)  Goal Weight: Short Term 187 lb (84.8 kg)    Goal Weight: Long Term 180 lb (81.6 kg)    Expected Outcomes Short Term: Continue to assess and modify interventions until short term weight is achieved;Long Term: Adherence to nutrition and physical activity/exercise program aimed toward attainment of established weight goal;Weight Loss: Understanding of general recommendations for a balanced deficit meal plan, which promotes 1-2 lb weight loss per week and includes a negative energy balance of [phone] kcal/d;Understanding recommendations for meals to include 15-35% energy as protein, 25-35% energy from fat, 35-60% energy from carbohydrates, less than 200mg  of dietary cholesterol, 20-35 gm of total fiber daily;Understanding of distribution of calorie intake throughout the day with the consumption of 4-5 meals/snacks    Diabetes Yes    Intervention Provide education about signs/symptoms and action to take for hypo/hyperglycemia.;Provide education about proper nutrition, including hydration, and aerobic/resistive exercise prescription along with prescribed medications to achieve blood glucose in normal ranges: Fasting glucose 65-99 mg/dL    Expected Outcomes Short Term: Participant verbalizes understanding of the signs/symptoms and immediate care of hyper/hypoglycemia, proper foot care and importance of medication, aerobic/resistive exercise and nutrition plan for blood glucose control.;Long Term:  Attainment of HbA1C < 7%.    Hypertension Yes    Intervention Provide education on lifestyle modifcations including regular physical activity/exercise, weight management, moderate sodium restriction and increased consumption of fresh fruit, vegetables, and low fat dairy, alcohol moderation, and smoking cessation.;Monitor prescription use compliance.    Expected Outcomes Short Term: Continued assessment and intervention until BP is < 140/64mm HG in hypertensive participants. < 130/34mm HG in hypertensive participants with diabetes, heart failure or chronic kidney disease.;Long Term: Maintenance of blood pressure at goal levels.    Lipids Yes    Intervention Provide education and support for participant on nutrition & aerobic/resistive exercise along with prescribed medications to achieve LDL 70mg , HDL >40mg .    Expected Outcomes Short Term: Participant states understanding of desired cholesterol values and is compliant with medications prescribed. Participant is following exercise prescription and nutrition guidelines.;Long Term: Cholesterol controlled with medications as prescribed, with individualized exercise RX and with personalized nutrition plan. Value goals: LDL < 70mg , HDL > 40 mg.             Core Components/Risk Factors/Patient Goals Review:   Goals and Risk Factor Review     Row Name 07/23/23 0936             Core Components/Risk Factors/Patient Goals Review   Personal Goals Review Weight Management/Obesity;Diabetes;Improve shortness of breath with ADL's;Stress;Hypertension       Review Novalyn is montioring her weight and has noticed that since they cut her lasix she has had fluxuating weight some days. She also noticed increase of SOB when exercising. She is going to start closely monitor her wight and SOB in the next couple weeks to see if there is a neigative effect on cutting her lasix. She also was cut down on her metoperlol and has noticed an increase in her BP. She does not  take her BP at home and we talked about trying to take it at home to montior for her doctors to see if the cut back on medications is negativly effecting her BP. She does check her sugar regular and said that it can be all over the place. She said that it can be because of her loves for sweets and also stress. She is going to try to cut back on her nighttime sweets.  Expected Outcomes Short term: montior her weight and blood pressure.   long term: continue to monitor weight and BP and exercise to help with stress                Core Components/Risk Factors/Patient Goals at Discharge (Final Review):   Goals and Risk Factor Review - 07/23/23 0936       Core Components/Risk Factors/Patient Goals Review   Personal Goals Review Weight Management/Obesity;Diabetes;Improve shortness of breath with ADL's;Stress;Hypertension    Review Evan is montioring her weight and has noticed that since they cut her lasix she has had fluxuating weight some days. She also noticed increase of SOB when exercising. She is going to start closely monitor her wight and SOB in the next couple weeks to see if there is a neigative effect on cutting her lasix. She also was cut down on her metoperlol and has noticed an increase in her BP. She does not take her BP at home and we talked about trying to take it at home to montior for her doctors to see if the cut back on medications is negativly effecting her BP. She does check her sugar regular and said that it can be all over the place. She said that it can be because of her loves for sweets and also stress. She is going to try to cut back on her nighttime sweets.    Expected Outcomes Short term: montior her weight and blood pressure.   long term: continue to monitor weight and BP and exercise to help with stress             ITP Comments:  ITP Comments     Row Name 07/09/23 1203 07/10/23 0933 07/11/23 0755 07/23/23 0956 08/08/23 0650   ITP Comments Completed virtual  orientation today.  EP evaluation is scheduled for 07/10/23 at 830.  Documentation for diagnosis can be found in [facility] encounter 05/14/23 and OV 06/04/23. Patient attend orientation today.  Patient is attendingCardiac Rehabilitation Program.  Documentation for diagnosis can be found in [facility] 05/13/53.  Reviewed medical chart, RPE/RPD, gym safety, and program guidelines.  Patient was fitted to equipment they will be using during rehab.  Patient is scheduled to start exercise on 07/13/23.   Initial ITP created and sent for review and signature by Dr. Dina Rich, Medical Director for Cardiac Rehabilitation Program. 30 day review completed. ITP sent to Dr. Dina Rich, Medical Director of Cardiac Rehab. Continue with ITP unless changes are made by physician.  Pt just oriented 07/11/23. Went over home exercise today. Shenay said that he dad has equipement at his home and she will starting going over there to workout with him. She is also going to start walking 30 day review completed. ITP sent to Dr. Dina Rich, Medical Director of Cardiac Rehab. Continue with ITP unless changes are made by physician.            Comments: 30 day review

## 2023-08-09 ENCOUNTER — Telehealth: Payer: Self-pay

## 2023-08-09 NOTE — Telephone Encounter (Signed)
Patient contacted the office with concerns of falling after feelings of dizziness. She states that this has increased over the last several weeks. She says that when she changes positions she feels this way and loses her balance and falls. She states that her blood pressure is usually really good 120-80 per patient. She states that she has also been seen by Cardiology about this and her medications have been adjusted. Advised she needed to contact her Cardiology office about concerning symptoms and in the meantime she needed to change her positions slowly. Also advised to start checking her blood pressure and heart rate more often especially when she is feeling symptomatic. She acknowledged receipt.   She also asked if she could have a return to work note. But is hesitant due to symptoms she currently has. Advised that from a surgical standpoint she is 12 weeks out of surgery and she has been discharged from the office but she needed to check with her Cardiology office for further instructions about returning to work due to dizziness and falls. She acknowledged receipt. Work note sent per patient request.

## 2023-08-10 ENCOUNTER — Encounter (HOSPITAL_COMMUNITY)
Admission: RE | Admit: 2023-08-10 | Discharge: 2023-08-10 | Disposition: A | Discharge status: Home / Self Care | Payer: 59 | Source: Ambulatory Visit | Attending: Internal Medicine | Admitting: Internal Medicine

## 2023-08-10 DIAGNOSIS — Z952 Presence of prosthetic heart valve: Secondary | ICD-10-CM | POA: Diagnosis not present

## 2023-08-10 NOTE — Progress Notes (Signed)
Daily Session Note  Patient Details  Name: Stacy Frost MRN: [id number] Date of Birth: February 27, 1961 Referring Provider:   Flowsheet Row CARDIAC REHAB PHASE II ORIENTATION from 07/10/2023 in [facility] CARDIAC REHABILITATION  Referring Provider Dietrich Pates MD       Encounter Date: 08/10/2023  Check In:  Session Check In - 08/10/23 0900       Check-In   Supervising physician immediately available to respond to emergencies See telemetry face sheet for immediately available MD    Location [facility]    Staff Present Ross Ludwig, BS, Exercise Physiologist;Kasidi Shanker BSN, RN;Other    Virtual Visit No    Medication changes reported     No    Fall or balance concerns reported    Yes    Comments Pt fell at home yesterday 08/09/23 due to dizziness.  She stated that she is going to the doctor today to discuss possible changes in her medication.  Pt staying on the ellipictal today.    Tobacco Cessation No Change    Warm-up and Cool-down Performed on first and last piece of equipment    Resistance Training Performed Yes    VAD Patient? No    PAD/SET Patient? No      Pain Assessment   Currently in Pain? No/denies    Multiple Pain Sites No             Capillary Blood Glucose: No results found for this or any previous visit (from the past 24 hour(s)).    Social History   Tobacco Use  Smoking Status Never  Smokeless Tobacco Never    Goals Met:  Independence with exercise equipment Exercise tolerated well No report of concerns or symptoms today Strength training completed today  Goals Unmet:  Not Applicable  Comments: Marland KitchenMarland KitchenPt able to follow exercise prescription today without complaint.  Will continue to monitor for progression

## 2023-08-13 ENCOUNTER — Encounter (HOSPITAL_COMMUNITY)
Admission: RE | Admit: 2023-08-13 | Discharge: 2023-08-13 | Disposition: A | Discharge status: Home / Self Care | Payer: 59 | Source: Ambulatory Visit | Attending: Internal Medicine

## 2023-08-13 DIAGNOSIS — Z952 Presence of prosthetic heart valve: Secondary | ICD-10-CM

## 2023-08-13 NOTE — Progress Notes (Signed)
Daily Session Note  Patient Details  Name: Stacy Frost MRN: [id number] Date of Birth: November 19, 1960 Referring Provider:   Flowsheet Row CARDIAC REHAB PHASE II ORIENTATION from 07/10/2023 in [facility] CARDIAC REHABILITATION  Referring Provider Dietrich Pates MD       Encounter Date: 08/13/2023  Check In:  Session Check In - 08/13/23 0900       Check-In   Supervising physician immediately available to respond to emergencies See telemetry face sheet for immediately available ER MD    Location [facility]    Staff Present Rodena Medin, RN, BSN;Heather Fredric Mare, BS, Exercise Physiologist    Virtual Visit No    Medication changes reported     No    Fall or balance concerns reported    Yes    Comments Patient feel Sunday coming out of church due to dizziness. She scraped both knees.    Warm-up and Cool-down Performed on first and last piece of equipment    Resistance Training Performed Yes    VAD Patient? No    PAD/SET Patient? No      Pain Assessment   Currently in Pain? No/denies    Multiple Pain Sites No             Capillary Blood Glucose: No results found for this or any previous visit (from the past 24 hour(s)).    Social History   Tobacco Use  Smoking Status Never  Smokeless Tobacco Never    Goals Met:  Independence with exercise equipment Exercise tolerated well No report of concerns or symptoms today Strength training completed today  Goals Unmet:  Not Applicable  Comments: Pt able to follow exercise prescription today without complaint.  Will continue to monitor for progression.

## 2023-08-15 ENCOUNTER — Encounter (HOSPITAL_COMMUNITY)
Admission: RE | Admit: 2023-08-15 | Discharge: 2023-08-15 | Disposition: A | Discharge status: Home / Self Care | Payer: 59 | Source: Ambulatory Visit | Attending: Internal Medicine

## 2023-08-15 DIAGNOSIS — Z952 Presence of prosthetic heart valve: Secondary | ICD-10-CM

## 2023-08-15 NOTE — Progress Notes (Signed)
Daily Session Note  Patient Details  Name: Stacy Frost MRN: [id number] Date of Birth: [birth year] Referring Provider:   Flowsheet Row CARDIAC REHAB PHASE II ORIENTATION from 07/10/2023 in [facility] CARDIAC REHABILITATION  Referring Provider Dietrich Pates MD       Encounter Date: 08/15/2023  Check In:  Session Check In - 08/15/23 0935       Check-In   Supervising physician immediately available to respond to emergencies See telemetry face sheet for immediately available MD    Location [facility]    Staff Present Ross Ludwig, BS, Exercise Physiologist;Jessica Juanetta Gosling, MA, RCEP, CCRP, CCET    Virtual Visit No    Medication changes reported     No    Fall or balance concerns reported    Yes    Comments Patient has fallen in twice in the last two weeks. Feels dizzy daily    Tobacco Cessation No Change    Warm-up and Cool-down Performed on first and last piece of equipment    Resistance Training Performed Yes    VAD Patient? No    PAD/SET Patient? No      Pain Assessment   Currently in Pain? No/denies    Multiple Pain Sites No             Capillary Blood Glucose: No results found for this or any previous visit (from the past 24 hour(s)).    Social History   Tobacco Use  Smoking Status Never  Smokeless Tobacco Never    Goals Met:  Independence with exercise equipment Exercise tolerated well No report of concerns or symptoms today Strength training completed today  Goals Unmet:  Not Applicable  Comments: Pt able to follow exercise prescription today without complaint.  Will continue to monitor for progression.

## 2023-08-17 ENCOUNTER — Encounter (HOSPITAL_COMMUNITY)
Admission: RE | Admit: 2023-08-17 | Discharge: 2023-08-17 | Disposition: A | Discharge status: Home / Self Care | Payer: 59 | Source: Ambulatory Visit | Attending: Internal Medicine

## 2023-08-17 DIAGNOSIS — Z952 Presence of prosthetic heart valve: Secondary | ICD-10-CM

## 2023-08-17 NOTE — Progress Notes (Signed)
Daily Session Note  Patient Details  Name: Stacy Frost MRN: [id number] Date of Birth: Oct 29, 1961 Referring Provider:   Flowsheet Row CARDIAC REHAB PHASE II ORIENTATION from 07/10/2023 in [facility] CARDIAC REHABILITATION  Referring Provider Dietrich Pates MD       Encounter Date: 08/17/2023  Check In:  Session Check In - 08/17/23 0900       Check-In   Supervising physician immediately available to respond to emergencies See telemetry face sheet for immediately available MD    Location [facility]    Staff Present [facility] BSN, RN;Jessica [place], MA, RCEP, CCRP, CCET;Enid Derry, RN, BSN    Virtual Visit No    Medication changes reported     No    Fall or balance concerns reported    Yes    Comments Patient has fallen in twice in the last two weeks. Feels dizzy daily    Tobacco Cessation No Change    Warm-up and Cool-down Performed on first and last piece of equipment    Resistance Training Performed Yes    VAD Patient? No    PAD/SET Patient? No      Pain Assessment   Currently in Pain? No/denies    Multiple Pain Sites No             Capillary Blood Glucose: No results found for this or any previous visit (from the past 24 hour(s)).    Social History   Tobacco Use  Smoking Status Never  Smokeless Tobacco Never    Goals Met:  Independence with exercise equipment Exercise tolerated well No report of concerns or symptoms today Strength training completed today  Goals Unmet:  Not Applicable  Comments: Marland KitchenMarland KitchenPt able to follow exercise prescription today without complaint.  Will continue to monitor for progression.

## 2023-08-20 ENCOUNTER — Encounter (HOSPITAL_COMMUNITY)
Admission: RE | Admit: 2023-08-20 | Discharge: 2023-08-20 | Disposition: A | Discharge status: Home / Self Care | Payer: 59 | Source: Ambulatory Visit | Attending: Internal Medicine | Admitting: *Deleted

## 2023-08-20 ENCOUNTER — Other Ambulatory Visit: Payer: Self-pay | Admitting: Physician Assistant

## 2023-08-20 DIAGNOSIS — Z952 Presence of prosthetic heart valve: Secondary | ICD-10-CM | POA: Diagnosis not present

## 2023-08-20 NOTE — Progress Notes (Signed)
Daily Session Note  Patient Details  Name: Stacy Frost MRN: [id number] Date of Birth: 04-Mar-1961 Referring Provider:   Flowsheet Row CARDIAC REHAB PHASE II ORIENTATION from 07/10/2023 in [facility] CARDIAC REHABILITATION  Referring Provider Dietrich Pates MD       Encounter Date: 08/20/2023  Check In:  Session Check In - 08/20/23 0925       Check-In   Supervising physician immediately available to respond to emergencies See telemetry face sheet for immediately available MD    Location [facility]    Staff Present Fabio Pierce, MA, RCEP, CCRP, CCET;Heather Fredric Mare, [place], Exercise Physiologist;Phyllis Billingsley, RN    Virtual Visit No    Medication changes reported     No    Fall or balance concerns reported    No    Warm-up and Cool-down Performed on first and last piece of equipment    Resistance Training Performed Yes    VAD Patient? No    PAD/SET Patient? No      Pain Assessment   Currently in Pain? No/denies             Capillary Blood Glucose: No results found for this or any previous visit (from the past 24 hour(s)).    Social History   Tobacco Use  Smoking Status Never  Smokeless Tobacco Never    Goals Met:  Independence with exercise equipment Exercise tolerated well No report of concerns or symptoms today Strength training completed today  Goals Unmet:  Not Applicable  Comments: Pt able to follow exercise prescription today without complaint.  Will continue to monitor for progression.

## 2023-08-22 ENCOUNTER — Encounter (HOSPITAL_COMMUNITY)
Admission: RE | Admit: 2023-08-22 | Discharge: 2023-08-22 | Disposition: A | Discharge status: Home / Self Care | Payer: 59 | Source: Ambulatory Visit | Attending: Internal Medicine

## 2023-08-22 DIAGNOSIS — Z952 Presence of prosthetic heart valve: Secondary | ICD-10-CM | POA: Diagnosis not present

## 2023-08-22 NOTE — Progress Notes (Signed)
Daily Session Note  Patient Details  Name: Stacy Frost MRN: [id number] Date of Birth: 24-Apr-1961 Referring Provider:   Flowsheet Row CARDIAC REHAB PHASE II ORIENTATION from 07/10/2023 in [facility] CARDIAC REHABILITATION  Referring Provider Dietrich Pates MD       Encounter Date: 08/22/2023  Check In:  Session Check In - 08/22/23 0934       Check-In   Supervising physician immediately available to respond to emergencies See telemetry face sheet for immediately available MD    Location [facility]    Staff Present Ross Ludwig, BS, Exercise Physiologist;Cohan Stipes Juanetta Gosling, MA, RCEP, CCRP, CCET;Hillary Troutman BSN, RN    Virtual Visit No    Medication changes reported     No    Fall or balance concerns reported    No    Warm-up and Cool-down Performed on first and last piece of equipment    Resistance Training Performed Yes    VAD Patient? No    PAD/SET Patient? No      Pain Assessment   Currently in Pain? No/denies             Capillary Blood Glucose: No results found for this or any previous visit (from the past 24 hour(s)).    Social History   Tobacco Use  Smoking Status Never  Smokeless Tobacco Never    Goals Met:  Independence with exercise equipment Exercise tolerated well No report of concerns or symptoms today Strength training completed today  Goals Unmet:  Not Applicable  Comments: Pt able to follow exercise prescription today without complaint.  Will continue to monitor for progression.

## 2023-08-24 ENCOUNTER — Encounter (HOSPITAL_COMMUNITY)
Admission: RE | Admit: 2023-08-24 | Discharge: 2023-08-24 | Disposition: A | Discharge status: Home / Self Care | Payer: 59 | Source: Ambulatory Visit | Attending: Internal Medicine | Admitting: Internal Medicine

## 2023-08-24 DIAGNOSIS — Z952 Presence of prosthetic heart valve: Secondary | ICD-10-CM

## 2023-08-24 NOTE — Progress Notes (Signed)
Daily Session Note  Patient Details  Name: Stacy Frost MRN: [id number] Date of Birth: 01/30/1961 Referring Provider:   Flowsheet Row CARDIAC REHAB PHASE II ORIENTATION from 07/10/2023 in [facility] CARDIAC REHABILITATION  Referring Provider Dietrich Pates MD       Encounter Date: 08/24/2023  Check In:  Session Check In - 08/24/23 0915       Check-In   Supervising physician immediately available to respond to emergencies See telemetry face sheet for immediately available MD    Location [facility]    Staff Present [facility] BSN, RN;Jessica [place], [place], RCEP, CCRP, CCET    Virtual Visit No    Medication changes reported     No    Fall or balance concerns reported    Yes    Comments Pt complaining of dizziness    Tobacco Cessation No Change    Warm-up and Cool-down Performed on first and last piece of equipment    Resistance Training Performed Yes    VAD Patient? No    PAD/SET Patient? No      Pain Assessment   Currently in Pain? No/denies    Multiple Pain Sites No             Capillary Blood Glucose: No results found for this or any previous visit (from the past 24 hour(s)).    Social History   Tobacco Use  Smoking Status Never  Smokeless Tobacco Never    Goals Met:  Independence with exercise equipment Exercise tolerated well No report of concerns or symptoms today Strength training completed today  Goals Unmet:  Not Applicable  Comments: Marland KitchenMarland KitchenPt able to follow exercise prescription today without complaint.  Will continue to monitor for progression.

## 2023-08-27 ENCOUNTER — Encounter (HOSPITAL_COMMUNITY)
Admission: RE | Admit: 2023-08-27 | Discharge: 2023-08-27 | Disposition: A | Discharge status: Home / Self Care | Payer: 59 | Source: Ambulatory Visit | Attending: Internal Medicine | Admitting: *Deleted

## 2023-08-27 DIAGNOSIS — Z952 Presence of prosthetic heart valve: Secondary | ICD-10-CM

## 2023-08-27 NOTE — Progress Notes (Signed)
Daily Session Note  Patient Details  Name: Stacy Frost MRN: [id number] Date of Birth: [birth year] Referring Provider:   Flowsheet Row CARDIAC REHAB PHASE II ORIENTATION from 07/10/2023 in [facility] CARDIAC REHABILITATION  Referring Provider Dietrich Pates MD       Encounter Date: 08/27/2023  Check In:  Session Check In - 08/27/23 8657       Check-In   Supervising physician immediately available to respond to emergencies See telemetry face sheet for immediately available MD    Location [facility]-Cardiac & Pulmonary Rehab    Staff Present Fabio Pierce, MA, RCEP, CCRP, CCET    Staff Present Cyndia Diver, RN, BSN, MA    Virtual Visit No    Medication changes reported     No    Fall or balance concerns reported    No    Warm-up and Cool-down Performed on first and last piece of equipment    Resistance Training Performed Yes    VAD Patient? No    PAD/SET Patient? No      Pain Assessment   Currently in Pain? No/denies             Capillary Blood Glucose: No results found for this or any previous visit (from the past 24 hour(s)).    Social History   Tobacco Use  Smoking Status Never  Smokeless Tobacco Never    Goals Met:  Independence with exercise equipment Exercise tolerated well No report of concerns or symptoms today Strength training completed today  Goals Unmet:  Not Applicable  Comments: Pt able to follow exercise prescription today without complaint.  Will continue to monitor for progression.

## 2023-08-29 ENCOUNTER — Encounter (HOSPITAL_COMMUNITY)
Admission: RE | Admit: 2023-08-29 | Discharge: 2023-08-29 | Disposition: A | Discharge status: Home / Self Care | Payer: 59 | Source: Ambulatory Visit | Attending: Internal Medicine | Admitting: Internal Medicine

## 2023-08-29 DIAGNOSIS — Z952 Presence of prosthetic heart valve: Secondary | ICD-10-CM | POA: Diagnosis not present

## 2023-08-29 NOTE — Progress Notes (Signed)
Daily Session Note  Patient Details  Name: Kwanna Freehill MRN: [id number] Date of Birth: 1961-08-19 Referring Provider:   Flowsheet Row CARDIAC REHAB PHASE II ORIENTATION from 07/10/2023 in [facility] CARDIAC REHABILITATION  Referring Provider Dietrich Pates MD       Encounter Date: 08/29/2023  Check In:  Session Check In - 08/29/23 0930       Check-In   Supervising physician immediately available to respond to emergencies See telemetry face sheet for immediately available MD    Location [facility]    Staff Present Ross Ludwig, BS, Exercise Physiologist;Jessica Juanetta Gosling, MA, RCEP, CCRP, CCET    Virtual Visit No    Medication changes reported     No    Fall or balance concerns reported    No    Tobacco Cessation No Change    Warm-up and Cool-down Performed on first and last piece of equipment    Resistance Training Performed Yes    VAD Patient? No    PAD/SET Patient? No      Pain Assessment   Currently in Pain? No/denies    Multiple Pain Sites No             Capillary Blood Glucose: No results found for this or any previous visit (from the past 24 hour(s)).    Social History   Tobacco Use  Smoking Status Never  Smokeless Tobacco Never    Goals Met:  Independence with exercise equipment Exercise tolerated well No report of concerns or symptoms today Strength training completed today  Goals Unmet:  Not Applicable  Comments: Pt able to follow exercise prescription today without complaint.  Will continue to monitor for progression.

## 2023-08-31 ENCOUNTER — Encounter (HOSPITAL_COMMUNITY)
Admission: RE | Admit: 2023-08-31 | Discharge: 2023-08-31 | Disposition: A | Discharge status: Home / Self Care | Payer: 59 | Source: Ambulatory Visit | Attending: Internal Medicine | Admitting: Internal Medicine

## 2023-08-31 DIAGNOSIS — Z952 Presence of prosthetic heart valve: Secondary | ICD-10-CM | POA: Insufficient documentation

## 2023-08-31 NOTE — Progress Notes (Signed)
Daily Session Note  Patient Details  Name: Stacy Frost MRN: [id number] Date of Birth: 01-19-1961 Referring Provider:   Flowsheet Row CARDIAC REHAB PHASE II ORIENTATION from 07/10/2023 in [facility] CARDIAC REHABILITATION  Referring Provider Dietrich Pates MD       Encounter Date: 08/31/2023  Check In:  Session Check In - 08/31/23 0915       Check-In   Supervising physician immediately available to respond to emergencies See telemetry face sheet for immediately available MD    Location [facility]    Staff Present Rodena Medin, RN, BSN;Jessica Juanetta Gosling, MA, RCEP, CCRP, CCET;Shavonta Gossen Fredric Mare, [place], Exercise Physiologist    Virtual Visit No    Medication changes reported     No    Fall or balance concerns reported    No    Tobacco Cessation No Change    Warm-up and Cool-down Performed on first and last piece of equipment    Resistance Training Performed Yes    VAD Patient? No    PAD/SET Patient? No      Pain Assessment   Currently in Pain? No/denies    Multiple Pain Sites No             Capillary Blood Glucose: No results found for this or any previous visit (from the past 24 hour(s)).    Social History   Tobacco Use  Smoking Status Never  Smokeless Tobacco Never    Goals Met:  Independence with exercise equipment Exercise tolerated well No report of concerns or symptoms today Strength training completed today  Goals Unmet:  Not Applicable  Comments: Pt able to follow exercise prescription today without complaint.  Will continue to monitor for progression.

## 2023-09-03 ENCOUNTER — Encounter (HOSPITAL_COMMUNITY)
Admission: RE | Admit: 2023-09-03 | Discharge: 2023-09-03 | Disposition: A | Discharge status: Home / Self Care | Payer: 59 | Source: Ambulatory Visit | Attending: Internal Medicine

## 2023-09-03 DIAGNOSIS — Z952 Presence of prosthetic heart valve: Secondary | ICD-10-CM | POA: Diagnosis not present

## 2023-09-03 NOTE — Progress Notes (Signed)
Daily Session Note  Patient Details  Name: Stacy Frost MRN: [id number] Date of Birth: 1960/11/14 Referring Provider:   Flowsheet Row CARDIAC REHAB PHASE II ORIENTATION from 07/10/2023 in [facility] CARDIAC REHABILITATION  Referring Provider Dietrich Pates MD       Encounter Date: 09/03/2023  Check In:  Session Check In - 09/03/23 0921       Check-In   Supervising physician immediately available to respond to emergencies See telemetry face sheet for immediately available MD    Location [facility]    Staff Present Ross Ludwig, BS, Exercise Physiologist;Elizjah Noblet Juanetta Gosling, MA, RCEP, CCRP, Dow Adolph, RN, BSN    Virtual Visit No    Medication changes reported     No    Fall or balance concerns reported    No    Warm-up and Cool-down Performed on first and last piece of equipment    Resistance Training Performed Yes    VAD Patient? No    PAD/SET Patient? No      Pain Assessment   Currently in Pain? No/denies             Capillary Blood Glucose: No results found for this or any previous visit (from the past 24 hour(s)).    Social History   Tobacco Use  Smoking Status Never  Smokeless Tobacco Never    Goals Met:  Independence with exercise equipment Exercise tolerated well No report of concerns or symptoms today Strength training completed today  Goals Unmet:  Not Applicable  Comments: Pt able to follow exercise prescription today without complaint.  Will continue to monitor for progression.

## 2023-09-04 ENCOUNTER — Telehealth: Payer: Self-pay | Admitting: Internal Medicine

## 2023-09-04 DIAGNOSIS — Z952 Presence of prosthetic heart valve: Secondary | ICD-10-CM

## 2023-09-04 NOTE — Telephone Encounter (Signed)
  STAT if patient feels like he/she is going to faint   1. Are you feeling dizzy, lightheaded, or faint right now? Yes, dizzy  2. Have you passed out?  No   3. Do you have any other symptoms? Not walking as well, has had some falls, last fall was a week ago, last week had episode of vomitting twice in one day, states she keeps a headache. Patient also states she is still in Cardiac rehab.  4. Have you checked your HR and BP (record if available)? Has not checked it

## 2023-09-04 NOTE — Telephone Encounter (Signed)
Pt states that she has been having dizzy spells over the last 2 weeks. She stats she is starting to fall. Last fall was 1 week ago. Pt reports nausea and vomiting. Last episode was one week ago.  BP on yesterday was 114/62. Current BP is 139/81 Hr 84. Please advise.

## 2023-09-05 ENCOUNTER — Encounter (HOSPITAL_COMMUNITY)
Admission: RE | Admit: 2023-09-05 | Discharge: 2023-09-05 | Disposition: A | Discharge status: Home / Self Care | Payer: 59 | Source: Ambulatory Visit | Attending: Internal Medicine | Admitting: Internal Medicine

## 2023-09-05 ENCOUNTER — Other Ambulatory Visit: Payer: Self-pay | Admitting: *Deleted

## 2023-09-05 ENCOUNTER — Encounter (HOSPITAL_COMMUNITY): Payer: Self-pay | Admitting: *Deleted

## 2023-09-05 DIAGNOSIS — Z952 Presence of prosthetic heart valve: Secondary | ICD-10-CM

## 2023-09-05 DIAGNOSIS — Z01818 Encounter for other preprocedural examination: Secondary | ICD-10-CM

## 2023-09-05 DIAGNOSIS — Q2381 Bicuspid aortic valve: Secondary | ICD-10-CM

## 2023-09-05 NOTE — Telephone Encounter (Signed)
Orders placed, pt notified

## 2023-09-05 NOTE — Progress Notes (Signed)
Daily Session Note  Patient Details  Name: Stacy Frost MRN: [id number] Date of Birth: [birth year] Referring Provider:   Flowsheet Row CARDIAC REHAB PHASE II ORIENTATION from 07/10/2023 in [facility] CARDIAC REHABILITATION  Referring Provider Dietrich Pates MD       Encounter Date: 09/05/2023  Check In:  Session Check In - 09/05/23 0915       Check-In   Supervising physician immediately available to respond to emergencies See telemetry face sheet for immediately available MD    Location [facility]    Staff Present Ross Ludwig, BS, Exercise Physiologist;Other;Jessica Juanetta Gosling, MA, RCEP, CCRP, CCET    Virtual Visit No    Medication changes reported     No    Fall or balance concerns reported    No    Tobacco Cessation No Change    Warm-up and Cool-down Performed on first and last piece of equipment    Resistance Training Performed Yes    VAD Patient? No    PAD/SET Patient? No      Pain Assessment   Currently in Pain? No/denies    Multiple Pain Sites No             Capillary Blood Glucose: No results found for this or any previous visit (from the past 24 hour(s)).    Social History   Tobacco Use  Smoking Status Never  Smokeless Tobacco Never    Goals Met:  Independence with exercise equipment Exercise tolerated well No report of concerns or symptoms today Strength training completed today  Goals Unmet:  Not Applicable  Comments: Pt able to follow exercise prescription today without complaint.  Will continue to monitor for progression.

## 2023-09-05 NOTE — Progress Notes (Signed)
Cardiac Individual Treatment Plan  Patient Details  Name: Stacy Frost MRN: [id number] Date of Birth: 1960-12-30 Referring Provider:   Flowsheet Row CARDIAC REHAB PHASE II ORIENTATION from 07/10/2023 in [facility] CARDIAC REHABILITATION  Referring Provider Dietrich Pates MD       Initial Encounter Date:  Flowsheet Row CARDIAC REHAB PHASE II ORIENTATION from 07/10/2023 in [facility] CARDIAC REHABILITATION  Date 07/10/23       Visit Diagnosis: S/P AVR (aortic valve replacement)  Patient's Home Medications on Admission:  Current Outpatient Medications:    aspirin EC 325 MG tablet, Take 1 tablet (325 mg total) by mouth daily., Disp: , Rfl:    atorvastatin (LIPITOR) 80 MG tablet, Take 1 tablet (80 mg total) by mouth daily., Disp: 30 tablet, Rfl: 5   celecoxib (CELEBREX) 200 MG capsule, Take 200 mg by mouth 2 (two) times daily., Disp: , Rfl:    citalopram (CELEXA) 40 MG tablet, Take 40 mg by mouth daily., Disp: , Rfl:    clonazePAM (KLONOPIN) 0.5 MG tablet, Take 0.5 mg by mouth 2 (two) times daily as needed., Disp: , Rfl:    Compression Bandages KIT, 1 each by Does not apply route daily as needed., Disp: 1 kit, Rfl: 0   Continuous Blood Gluc Receiver (DEXCOM G7 RECEIVER) DEVI, USE 1 receiver continuously, Disp: , Rfl:    Continuous Blood Gluc Sensor (DEXCOM G7 SENSOR) MISC, APPLY 1 SENSOR EVERY 10 DAYS, Disp: , Rfl:    cyclobenzaprine (FLEXERIL) 10 MG tablet, Take 10 mg by mouth 3 (three) times daily., Disp: , Rfl:    ferrous sulfate 325 (65 FE) MG tablet, Take 1 tablet (325 mg total) by mouth daily with breakfast., Disp: 60 tablet, Rfl: 1   furosemide (LASIX) 40 MG tablet, Take 1 tablet (40 mg total) by mouth daily as needed (for swelling and weight gain)., Disp: 30 tablet, Rfl: 5   metFORMIN (GLUCOPHAGE-XR) 500 MG 24 hr tablet, Take 2 tablets (1,000 mg total) by mouth daily with breakfast., Disp: 180 tablet, Rfl: 1   metoprolol (TOPROL-XL) 100 MG 24 hr tablet, Take 1 tablet (100 mg total) by  mouth daily., Disp: 90 tablet, Rfl: 1   potassium chloride SA (KLOR-CON M) 20 MEQ tablet, Take 1 tablet (20 mEq total) by mouth daily as needed., Disp: , Rfl:    pramipexole (MIRAPEX) 0.5 MG tablet, Take 0.5 mg by mouth at bedtime., Disp: , Rfl:    promethazine (PHENERGAN) 25 MG tablet, Take 25 mg by mouth 2 (two) times daily as needed., Disp: , Rfl:    tirzepatide (MOUNJARO) 7.5 MG/0.5ML Pen, Inject 7.5 mg into the skin once a week., Disp: 6 mL, Rfl: 1   traZODone (DESYREL) 100 MG tablet, Take 100 mg by mouth at bedtime., Disp: , Rfl:   Past Medical History: Past Medical History:  Diagnosis Date   Anxiety    Aortic stenosis    Mild to moderate   Bicuspid aortic valve    Essential hypertension, benign    GERD (gastroesophageal reflux disease)    History of cardiac catheterization    Normal coronaries 2008   Hyperlipidemia    Migraine headache    MVP (mitral valve prolapse)    Obesity    Pain in joint, ankle and foot    Chronic   Palpitations    Documented PACs by previous monitoring   Restless leg syndrome    Skin tag    Type 2 diabetes mellitus (HCC)    Unilateral primary osteoarthritis, right knee  09/06/2016    Tobacco Use: Social History   Tobacco Use  Smoking Status Never  Smokeless Tobacco Never    Labs: Review Flowsheet  More data exists      Latest Ref Rng & Units 07/25/2022 11/29/2022 05/02/2023 05/13/2023 05/14/2023  Labs for ITP Cardiac and Pulmonary Rehab  Hemoglobin A1c 4.8 - 5.6 % 7.8  6.2  - 7.4  7.4   PH, Arterial 7.35 - 7.45 - - 7.376  - 7.351  7.336  7.378  7.436  7.405  7.389  7.385  7.5   PCO2 arterial 32 - 48 mmHg - - 35.7  - 37.6  41.5  41.5  36.3  41.5  43.6  46.1  37   Bicarbonate 20.0 - 28.0 mmol/L - - 22.4  22.3  20.9  - 20.7  22.1  24.5  24.4  26.1  27.9  26.3  27.6  28.9   TCO2 22 - 32 mmol/L - - 24  24  22   - 22  23  26  26  29  27  27  30  29  28  29  29  29    Acid-base deficit 0.0 - 2.0 mmol/L - - 3.0  3.0  4.0  - 4.0  3.0  1.0   O2  Saturation % - - 67  66  94  - 88  98  98  100  100  84  100  99  96.4     Details       Multiple values from one day are sorted in reverse-chronological order         Capillary Blood Glucose: Lab Results  Component Value Date   GLUCAP 173 (H) 07/18/2023   GLUCAP 193 (H) 07/18/2023   GLUCAP 165 (H) 07/16/2023   GLUCAP 174 (H) 07/16/2023   GLUCAP 184 (H) 07/13/2023     Exercise Target Goals: Exercise Program Goal: Individual exercise prescription set using results from initial 6 min walk test and THRR while considering  patient's activity barriers and safety.   Exercise Prescription Goal: Starting with aerobic activity 30 plus minutes a day, 3 days per week for initial exercise prescription. Provide home exercise prescription and guidelines that participant acknowledges understanding prior to discharge.  Activity Barriers & Risk Stratification:  Activity Barriers & Cardiac Risk Stratification - 07/09/23 1150       Activity Barriers & Cardiac Risk Stratification   Activity Barriers Arthritis;Deconditioning;Muscular Weakness;Balance Concerns;History of Falls   bilateral hips arthritis   Cardiac Risk Stratification Moderate             6 Minute Walk:  6 Minute Walk     Row Name 07/10/23 0937         6 Minute Walk   Phase Initial     Distance 870 feet     Walk Time 6 minutes     MPH 1.65     METS 2.36     RPE 12     Perceived Dyspnea  1     VO2 Peak 8.25     Symptoms Yes (comment)     Comments SOB, chronic bilateral hip pain 7/10     Resting HR 78 bpm     Resting BP 124/64     Resting Oxygen Saturation  98 %     Exercise Oxygen Saturation  during 6 min walk 97 %     Max Ex. HR 108 bpm     Max Ex. BP 134/64  2 Minute Post BP 118/66              Oxygen Initial Assessment:   Oxygen Re-Evaluation:   Oxygen Discharge (Final Oxygen Re-Evaluation):   Initial Exercise Prescription:  Initial Exercise Prescription - 07/10/23 0900       Date  of Initial Exercise RX and Referring Provider   Date 07/10/23    Referring Provider Dietrich Pates MD      Oxygen   Maintain Oxygen Saturation 88% or higher      Treadmill   MPH 1.2    Grade 0.5    Minutes 15    METs 2      REL-XR   Level 1    Speed 50    Minutes 15    METs 2      Prescription Details   Frequency (times per week) 3    Duration Progress to 30 minutes of continuous aerobic without signs/symptoms of physical distress      Intensity   THRR 40-80% of Max Heartrate 110-141    Ratings of Perceived Exertion 11-13    Perceived Dyspnea 0-4      Progression   Progression Continue to progress workloads to maintain intensity without signs/symptoms of physical distress.      Resistance Training   Training Prescription Yes    Weight 3 lb    Reps 10-15             Perform Capillary Blood Glucose checks as needed.  Exercise Prescription Changes:   Exercise Prescription Changes     Row Name 07/10/23 0900 07/23/23 0900 07/23/23 1400 08/13/23 1300 09/03/23 1200     Response to Exercise   Blood Pressure (Admit) 124/64 -- 124/78 122/66 104/62   Blood Pressure (Exercise) 134/64 -- 182/80 -- --   Blood Pressure (Exit) 118/66 -- 128/80 106/68 122/62   Heart Rate (Admit) 78 bpm -- 87 bpm 80 bpm 107 bpm   Heart Rate (Exercise) 108 bpm -- 115 bpm 109 bpm 125 bpm   Heart Rate (Exit) 86 bpm -- 96 bpm 82 bpm 119 bpm   Oxygen Saturation (Admit) 98 % -- -- -- --   Oxygen Saturation (Exercise) 97 % -- -- -- --   Rating of Perceived Exertion (Exercise) 12 -- 15 15 12    Perceived Dyspnea (Exercise) 1 -- -- -- --   Symptoms SOB, chronic bilateral hip pain 7/10 -- -- -- --   Comments walk test results -- -- -- --   Duration -- -- Continue with 30 min of aerobic exercise without signs/symptoms of physical distress. Continue with 30 min of aerobic exercise without signs/symptoms of physical distress. Continue with 30 min of aerobic exercise without signs/symptoms of physical  distress.   Intensity -- -- THRR unchanged THRR unchanged THRR unchanged     Progression   Progression -- -- Continue to progress workloads to maintain intensity without signs/symptoms of physical distress. Continue to progress workloads to maintain intensity without signs/symptoms of physical distress. Continue to progress workloads to maintain intensity without signs/symptoms of physical distress.     Resistance Training   Training Prescription -- -- Yes Yes Yes   Weight -- -- 3 lbs 3 3 lbs   Reps -- -- 10-15 10-15 10-15   Time -- -- 10 Minutes -- --     Treadmill   MPH -- -- 1.9 -- --   Grade -- -- 0.5 -- --   Minutes -- -- 15 -- --   METs -- --  2.59 -- --     REL-XR   Level -- -- 3 4 4    Speed -- -- 47 54 53   Minutes -- -- 15 30 30    METs -- -- 2.5 2.7 2.9     Home Exercise Plan   Plans to continue exercise at -- Home (comment) Home (comment) Home (comment) Home (comment)   Frequency -- Add 2 additional days to program exercise sessions. Add 2 additional days to program exercise sessions. Add 2 additional days to program exercise sessions. Add 2 additional days to program exercise sessions.   Initial Home Exercises Provided -- 07/23/23 -- -- --     Oxygen   Maintain Oxygen Saturation -- -- 88% or higher 88% or higher 88% or higher            Exercise Comments:   Exercise Goals and Review:   Exercise Goals     Row Name 07/10/23 0946             Exercise Goals   Increase Physical Activity Yes       Intervention Provide advice, education, support and counseling about physical activity/exercise needs.;Develop an individualized exercise prescription for aerobic and resistive training based on initial evaluation findings, risk stratification, comorbidities and participant's personal goals.       Expected Outcomes Short Term: Attend rehab on a regular basis to increase amount of physical activity.;Long Term: Add in home exercise to make exercise part of routine and  to increase amount of physical activity.;Long Term: Exercising regularly at least 3-5 days a week.       Increase Strength and Stamina Yes       Intervention Provide advice, education, support and counseling about physical activity/exercise needs.;Develop an individualized exercise prescription for aerobic and resistive training based on initial evaluation findings, risk stratification, comorbidities and participant's personal goals.       Expected Outcomes Short Term: Perform resistance training exercises routinely during rehab and add in resistance training at home;Short Term: Increase workloads from initial exercise prescription for resistance, speed, and METs.;Long Term: Improve cardiorespiratory fitness, muscular endurance and strength as measured by increased METs and functional capacity ( )       Able to understand and use rate of perceived exertion (RPE) scale Yes       Intervention Provide education and explanation on how to use RPE scale       Expected Outcomes Short Term: Able to use RPE daily in rehab to express subjective intensity level;Long Term:  Able to use RPE to guide intensity level when exercising independently       Able to understand and use Dyspnea scale Yes       Intervention Provide education and explanation on how to use Dyspnea scale       Expected Outcomes Short Term: Able to use Dyspnea scale daily in rehab to express subjective sense of shortness of breath during exertion;Long Term: Able to use Dyspnea scale to guide intensity level when exercising independently       Knowledge and understanding of Target Heart Rate Range (THRR) Yes       Intervention Provide education and explanation of THRR including how the numbers were predicted and where they are located for reference       Expected Outcomes Short Term: Able to state/look up THRR;Long Term: Able to use THRR to govern intensity when exercising independently;Short Term: Able to use daily as guideline for intensity in  rehab  Able to check pulse independently Yes       Intervention Provide education and demonstration on how to check pulse in carotid and radial arteries.;Review the importance of being able to check your own pulse for safety during independent exercise       Expected Outcomes Short Term: Able to explain why pulse checking is important during independent exercise;Long Term: Able to check pulse independently and accurately       Understanding of Exercise Prescription Yes       Intervention Provide education, explanation, and written materials on patient's individual exercise prescription       Expected Outcomes Short Term: Able to explain program exercise prescription;Long Term: Able to explain home exercise prescription to exercise independently                Exercise Goals Re-Evaluation :  Exercise Goals Re-Evaluation     Row Name 07/10/23 [phone] 07/20/23 0934 07/23/23 1416 08/13/23 0922 09/04/23 0752     Exercise Goal Re-Evaluation   Exercise Goals Review Able to understand and use rate of perceived exertion (RPE) scale;Able to understand and use Dyspnea scale Increase Physical Activity;Increase Strength and Stamina;Understanding of Exercise Prescription Increase Physical Activity;Increase Strength and Stamina;Understanding of Exercise Prescription Increase Physical Activity;Increase Strength and Stamina;Understanding of Exercise Prescription Increase Physical Activity;Able to understand and use rate of perceived exertion (RPE) scale;Understanding of Exercise Prescription   Comments Reviewed RPE  and dyspnea scale, THR and program prescription with pt today.  Pt voiced understanding and was given a copy of goals to take home. Caitland is doing great in rehab. She starting to enjoying coming to rehab and exercising. She has started to increase her levels on both sets of equipment. She is starting to notice an increase of her endurance and stamina. Letty is tolerating exercise well. She has  increased her speed on the treadmill from the start of rehab to 1.9. She has also increased her level on the XR to level 3. Will continue to monitor and progress as able. Ekta has been doing great in rehab with exercise, she has increased her level on the XR. She has started to feel dizzy the past two weeeks. She has been doing the XR twice instead of walking the treadmill. She has fell twice in the last two weeks as well. She did feel like she has more energy but due to the dizzyness she just isnt feeling her greatest. She is calling to get an appointment with her doctor. Casaundra has been doing good in rehab. She has been feeling dizzy latel y so she is not wanting to use the treadmill. She has been on the XR for both sessions and is on level 4 working an 12 RPE. Will continue to monitor and progress as able.   Expected Outcomes Short: Use RPE daily to regulate intensity.  Long: Follow program prescription in THR. Short : continue to increasing levels  long term: continue to attend rehab and enjoy coming. Short term: Continue to exercsie at workloads until RPE is 11 then increase    long term: continue to attend rehab Short: monitor the dizzyness and call doctor   long: continue to exercise at rehab Short: increase level on XR to push self   long term: continue to attend rehab.             Discharge Exercise Prescription (Final Exercise Prescription Changes):  Exercise Prescription Changes - 09/03/23 1200       Response to Exercise   Blood  Pressure (Admit) 104/62    Blood Pressure (Exit) 122/62    Heart Rate (Admit) 107 bpm    Heart Rate (Exercise) 125 bpm    Heart Rate (Exit) 119 bpm    Rating of Perceived Exertion (Exercise) 12    Duration Continue with 30 min of aerobic exercise without signs/symptoms of physical distress.    Intensity THRR unchanged      Progression   Progression Continue to progress workloads to maintain intensity without signs/symptoms of physical distress.       Resistance Training   Training Prescription Yes    Weight 3 lbs    Reps 10-15      REL-XR   Level 4    Speed 53    Minutes 30    METs 2.9      Home Exercise Plan   Plans to continue exercise at Home (comment)    Frequency Add 2 additional days to program exercise sessions.      Oxygen   Maintain Oxygen Saturation 88% or higher             Nutrition:  Target Goals: Understanding of nutrition guidelines, daily intake of sodium 1500mg , cholesterol 200mg , calories 30% from fat and 7% or less from saturated fats, daily to have 5 or more servings of fruits and vegetables.  Biometrics:  Pre Biometrics - 07/10/23 0947       Pre Biometrics   Height 5\' 5"  (1.651 m)    Weight 192 lb (87.1 kg)    Waist Circumference 36 inches    Hip Circumference 45 inches    Waist to Hip Ratio 0.8 %    BMI (Calculated) 31.95    Grip Strength 21.5 kg    Single Leg Stand 10.9 seconds              Nutrition Therapy Plan and Nutrition Goals:  Nutrition Therapy & Goals - 07/09/23 1201       Intervention Plan   Intervention Prescribe, educate and counsel regarding individualized specific dietary modifications aiming towards targeted core components such as weight, hypertension, lipid management, diabetes, heart failure and other comorbidities.;Nutrition handout(s) given to patient.    Expected Outcomes Short Term Goal: Understand basic principles of dietary content, such as calories, fat, sodium, cholesterol and nutrients.;Long Term Goal: Adherence to prescribed nutrition plan.             Nutrition Assessments:  MEDIFICTS Score Key: >=70 Need to make dietary changes  40-70 Heart Healthy Diet <= 40 Therapeutic Level Cholesterol Diet  Flowsheet Row CARDIAC REHAB PHASE II EXERCISE from 07/16/2023 in [facility] CARDIAC REHABILITATION  Picture Your Plate Total Score on Admission 52      Picture Your Plate Scores: <40 Unhealthy dietary pattern with much room for  improvement. 41-50 Dietary pattern unlikely to meet recommendations for good health and room for improvement. 51-60 More healthful dietary pattern, with some room for improvement.  >60 Healthy dietary pattern, although there may be some specific behaviors that could be improved.    Nutrition Goals Re-Evaluation:  Nutrition Goals Re-Evaluation     Row Name 07/23/23 9811 08/13/23 0933           Goals   Nutrition Goal Healthy eating Healthy eating      Comment Malkia is stated that she has been happy with what she is eating. During the week she stated that they do eat more red meat during the week (ground beef). Her sides are fires in the air fryer,  baked potatos, green beans and peas. She said that it verires during the season and week what side they eat. She does eat a lot of sweets during the week and it does mess with her surgar. She drinks zero surgar sodas and when she drinks water it is with flavoring packet. Delight is still happy with what she picks to eat. She is cutting back on her red meat and starting to have chicken within the week. She continues to air fry her sides. She does eat lots of veggies. She does eat sweets during the week. She also continues to drink zero surgat sodas.      Expected Outcome Short term: cut out night time sweets   long term: focus on healthy eating Short term: continue to cut back on sweets   long term: focus on healthy eating               Nutrition Goals Discharge (Final Nutrition Goals Re-Evaluation):  Nutrition Goals Re-Evaluation - 08/13/23 0933       Goals   Nutrition Goal Healthy eating    Comment Genean is still happy with what she picks to eat. She is cutting back on her red meat and starting to have chicken within the week. She continues to air fry her sides. She does eat lots of veggies. She does eat sweets during the week. She also continues to drink zero surgat sodas.    Expected Outcome Short term: continue to cut back on sweets   long  term: focus on healthy eating             Psychosocial: Target Goals: Acknowledge presence or absence of significant depression and/or stress, maximize coping skills, provide positive support system. Participant is able to verbalize types and ability to use techniques and skills needed for reducing stress and depression.  Initial Review & Psychosocial Screening:  Initial Psych Review & Screening - 07/09/23 1151       Initial Review   Current issues with Current Psychotropic Meds;Current Anxiety/Panic;Current Depression;Current Stress Concerns    Source of Stress Concerns Chronic Illness;Family    Comments brother just passed (buried on 07/08/23), recovering from surgery (limited on driving still 30 min at a time)      Family Dynamics   Good Support System? Yes   husband     Barriers   Psychosocial barriers to participate in program The patient should benefit from training in stress management and relaxation.;Psychosocial barriers identified (see note)      Screening Interventions   Interventions Encouraged to exercise;To provide support and resources with identified psychosocial needs;Provide feedback about the scores to participant    Expected Outcomes Short Term goal: Utilizing psychosocial counselor, staff and physician to assist with identification of specific Stressors or current issues interfering with healing process. Setting desired goal for each stressor or current issue identified.;Long Term Goal: Stressors or current issues are controlled or eliminated.;Short Term goal: Identification and review with participant of any Quality of Life or Depression concerns found by scoring the questionnaire.;Long Term goal: The participant improves quality of Life and PHQ9 Scores as seen by post scores and/or verbalization of changes             Quality of Life Scores:  Quality of Life - 07/17/23 0818       Quality of Life   Select Quality of Life      Quality of Life Scores    Health/Function Pre 20.25 %    Socioeconomic Pre 25.08 %  Psych/Spiritual Pre 24.36 %    Family Pre 21.6 %    GLOBAL Pre 22.27 %            Scores of 19 and below usually indicate a poorer quality of life in these areas.  A difference of  2-3 points is a clinically meaningful difference.  A difference of 2-3 points in the total score of the Quality of Life Index has been associated with significant improvement in overall quality of life, self-image, physical symptoms, and general health in studies assessing change in quality of life.  PHQ-9: Review Flowsheet       07/10/2023  Depression screen PHQ 2/9  Decreased Interest 0  Down, Depressed, Hopeless 0  PHQ - 2 Score 0  Altered sleeping 0  Tired, decreased energy 0  Change in appetite 0  Feeling bad or failure about yourself  0  Trouble concentrating 0  Moving slowly or fidgety/restless 0  Suicidal thoughts 0  PHQ-9 Score 0  Difficult doing work/chores Not difficult at all    Details           Interpretation of Total Score  Total Score Depression Severity:  1-4 = Minimal depression, 5-9 = Mild depression, 10-14 = Moderate depression, 15-19 = Moderately severe depression, 20-27 = Severe depression   Psychosocial Evaluation and Intervention:  Psychosocial Evaluation - 07/09/23 1157       Psychosocial Evaluation & Interventions   Interventions Stress management education;Relaxation education;Encouraged to exercise with the program and follow exercise prescription    Comments Annalee is coming into 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  She is feeling good and excited to get going with program.  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 She just lost her brother last week and they buried him yesterday, but she is coping well with his death.  She does have a history of depression and anxiety, but feels well managed on her meds and  knows they will work for her when she needs them.  Leading up to surgery, she did note that she got very clumbsy and had several falls.  She also slipped in kitchen yesterday while mopping floors.  She is still restricted to on her driving, but otherwise feeling good from surgical standpoint.  She sleeps well and enjoys woodworking in her shop to relax.    Expected Outcomes Short: Attend rehab to build stamina Long: Continue to recover from surgery    Continue Psychosocial Services  Follow up required by staff             Psychosocial Re-Evaluation:  Psychosocial Re-Evaluation     Row Name 07/20/23 0949 08/13/23 0926           Psychosocial Re-Evaluation   Current issues with None Identified Current Stress Concerns      Comments Jolea has been doing great in rehab. She stated that she has been sleeping good and getting good night sleep. She does not have any major stressor in her life she stated. She is enjoying life and being with her family and grandkid Marshella has been doing great in rehab. She has only one major stressor and that is due to feeling dizzy the last two weeks. She is going to make an appointment to see what is causing the dizzyness. She continues to be in good spirits.      Expected  Outcomes Short term: continue to do do things she enjoys   long term: continue to have no stressors. Short: monitor dizzyness and contact doctor   long term: continue to be in good spirits      Interventions Encouraged to attend Cardiac Rehabilitation for the exercise;Stress management education;Relaxation education Stress management education;Relaxation education;Encouraged to attend Cardiac Rehabilitation for the exercise      Continue Psychosocial Services  Follow up required by staff Follow up required by staff               Psychosocial Discharge (Final Psychosocial Re-Evaluation):  Psychosocial Re-Evaluation - 08/13/23 0926       Psychosocial Re-Evaluation   Current issues with  Current Stress Concerns    Comments Regine has been doing great in rehab. She has only one major stressor and that is due to feeling dizzy the last two weeks. She is going to make an appointment to see what is causing the dizzyness. She continues to be in good spirits.    Expected Outcomes Short: monitor dizzyness and contact doctor   long term: continue to be in good spirits    Interventions Stress management education;Relaxation education;Encouraged to attend Cardiac Rehabilitation for the exercise    Continue Psychosocial Services  Follow up required by staff             Vocational Rehabilitation: Provide vocational rehab assistance to qualifying candidates.   Vocational Rehab Evaluation & Intervention:  Vocational Rehab - 07/09/23 1155       Initial Vocational Rehab Evaluation & Intervention   Assessment shows need for Vocational Rehabilitation No             Education: Education Goals: Education classes will be provided on a weekly basis, covering required topics. Participant will state understanding/return demonstration of topics presented.  Learning Barriers/Preferences:  Learning Barriers/Preferences - 07/09/23 1150       Learning Barriers/Preferences   Learning Barriers Sight   glasses   Learning Preferences None             Education Topics: Hypertension, Hypertension Reduction -Define heart disease and high blood pressure. Discus how high blood pressure affects the body and ways to reduce high blood pressure.   Exercise and Your Heart -Discuss why it is important to exercise, the FITT principles of exercise, normal and abnormal responses to exercise, and how to exercise safely. Flowsheet Row CARDIAC REHAB PHASE II EXERCISE from 08/29/2023 in [place] CARDIAC REHABILITATION  Date 07/25/23  Educator [facility]  Instruction Review Code 1- Verbalizes Understanding       Angina -Discuss definition of angina, causes of angina, treatment of angina, and how to  decrease risk of having angina. Flowsheet Row CARDIAC REHAB PHASE II EXERCISE from 08/29/2023 in [place] CARDIAC REHABILITATION  Date 07/18/23  Educator [facility]  Instruction Review Code 1- Verbalizes Understanding  [testing and procedures]       Cardiac Medications -Review what the following cardiac medications are used for, how they affect the body, and side effects that may occur when taking the medications.  Medications include Aspirin, Beta blockers, calcium channel blockers, ACE Inhibitors, angiotensin receptor blockers, diuretics, digoxin, and antihyperlipidemics.   Congestive Heart Failure -Discuss the definition of CHF, how to live with CHF, the signs and symptoms of CHF, and how keep track of weight and sodium intake. Flowsheet Row CARDIAC REHAB PHASE II EXERCISE from 08/29/2023 in [place] CARDIAC REHABILITATION  Date 08/29/23  Educator [facility]  Instruction Review Code 1- Verbalizes Understanding  Heart Disease and Intimacy -Discus the effect sexual activity has on the heart, how changes occur during intimacy as we age, and safety during sexual activity.   Smoking Cessation / COPD -Discuss different methods to quit smoking, the health benefits of quitting smoking, and the definition of COPD.   Nutrition I: Fats -Discuss the types of cholesterol, what cholesterol does to the heart, and how cholesterol levels can be controlled. Flowsheet Row CARDIAC REHAB PHASE II EXERCISE from 08/29/2023 in [place] CARDIAC REHABILITATION  Date 08/15/23  Educator [facility]  Instruction Review Code 1- Verbalizes Understanding       Nutrition II: Labels -Discuss the different components of food labels and how to read food label Flowsheet Row CARDIAC REHAB PHASE II EXERCISE from 08/29/2023 in [place] CARDIAC REHABILITATION  Date 08/15/23  Educator [facility]  Instruction Review Code 1- Verbalizes Understanding       Heart Parts/Heart Disease and PAD -Discuss the anatomy of the heart,  the pathway of blood circulation through the heart, and these are affected by heart disease.   Stress I: Signs and Symptoms -Discuss the causes of stress, how stress may lead to anxiety and depression, and ways to limit stress. Flowsheet Row CARDIAC REHAB PHASE II EXERCISE from 08/29/2023 in [place] CARDIAC REHABILITATION  Date 08/22/23  Educator [facility]  Instruction Review Code 1- Verbalizes Understanding       Stress II: Relaxation -Discuss different types of relaxation techniques to limit stress. Flowsheet Row CARDIAC REHAB PHASE II EXERCISE from 08/29/2023 in [place] CARDIAC REHABILITATION  Date 08/22/23  Educator [facility]  Instruction Review Code 1- Verbalizes Understanding       Warning Signs of Stroke / TIA -Discuss definition of a stroke, what the signs and symptoms are of a stroke, and how to identify when someone is having stroke.   Knowledge Questionnaire Score:  Knowledge Questionnaire Score - 07/17/23 0819       Knowledge Questionnaire Score   Pre Score 22/28             Core Components/Risk Factors/Patient Goals at Admission:  Personal Goals and Risk Factors at Admission - 07/10/23 0948       Core Components/Risk Factors/Patient Goals on Admission    Weight Management Yes;Obesity;Weight Loss    Intervention Weight Management: Develop a combined nutrition and exercise program designed to reach desired caloric intake, while maintaining appropriate intake of nutrient and fiber, sodium and fats, and appropriate energy expenditure required for the weight goal.;Weight Management: Provide education and appropriate resources to help participant work on and attain dietary goals.;Weight Management/Obesity: Establish reasonable short term and long term weight goals.;Obesity: Provide education and appropriate resources to help participant work on and attain dietary goals.    Admit Weight 192 lb (87.1 kg)    Goal Weight: Short Term 187 lb (84.8 kg)    Goal Weight: Long  Term 180 lb (81.6 kg)    Expected Outcomes Short Term: Continue to assess and modify interventions until short term weight is achieved;Long Term: Adherence to nutrition and physical activity/exercise program aimed toward attainment of established weight goal;Weight Loss: Understanding of general recommendations for a balanced deficit meal plan, which promotes 1-2 lb weight loss per week and includes a negative energy balance of [phone] kcal/d;Understanding recommendations for meals to include 15-35% energy as protein, 25-35% energy from fat, 35-60% energy from carbohydrates, less than 200mg  of dietary cholesterol, 20-35 gm of total fiber daily;Understanding of distribution of calorie intake throughout the day with the consumption of  4-5 meals/snacks    Diabetes Yes    Intervention Provide education about signs/symptoms and action to take for hypo/hyperglycemia.;Provide education about proper nutrition, including hydration, and aerobic/resistive exercise prescription along with prescribed medications to achieve blood glucose in normal ranges: Fasting glucose 65-99 mg/dL    Expected Outcomes Short Term: Participant verbalizes understanding of the signs/symptoms and immediate care of hyper/hypoglycemia, proper foot care and importance of medication, aerobic/resistive exercise and nutrition plan for blood glucose control.;Long Term: Attainment of HbA1C < 7%.    Hypertension Yes    Intervention Provide education on lifestyle modifcations including regular physical activity/exercise, weight management, moderate sodium restriction and increased consumption of fresh fruit, vegetables, and low fat dairy, alcohol moderation, and smoking cessation.;Monitor prescription use compliance.    Expected Outcomes Short Term: Continued assessment and intervention until BP is < 140/35mm HG in hypertensive participants. < 130/47mm HG in hypertensive participants with diabetes, heart failure or chronic kidney disease.;Long Term:  Maintenance of blood pressure at goal levels.    Lipids Yes    Intervention Provide education and support for participant on nutrition & aerobic/resistive exercise along with prescribed medications to achieve LDL 70mg , HDL >40mg .    Expected Outcomes Short Term: Participant states understanding of desired cholesterol values and is compliant with medications prescribed. Participant is following exercise prescription and nutrition guidelines.;Long Term: Cholesterol controlled with medications as prescribed, with individualized exercise RX and with personalized nutrition plan. Value goals: LDL < 70mg , HDL > 40 mg.             Core Components/Risk Factors/Patient Goals Review:   Goals and Risk Factor Review     Row Name 07/23/23 0936 08/13/23 0946           Core Components/Risk Factors/Patient Goals Review   Personal Goals Review Weight Management/Obesity;Diabetes;Improve shortness of breath with ADL's;Stress;Hypertension Weight Management/Obesity;Diabetes;Improve shortness of breath with ADL's;Stress;Hypertension      Review Zeena is montioring her weight and has noticed that since they cut her lasix she has had fluxuating weight some days. She also noticed increase of SOB when exercising. She is going to start closely monitor her wight and SOB in the next couple weeks to see if there is a neigative effect on cutting her lasix. She also was cut down on her metoperlol and has noticed an increase in her BP. She does not take her BP at home and we talked about trying to take it at home to montior for her doctors to see if the cut back on medications is negativly effecting her BP. She does check her sugar regular and said that it can be all over the place. She said that it can be because of her loves for sweets and also stress. She is going to try to cut back on her nighttime sweets. Aliyha continues to monitor her weight, she still continues to have fluxuating weight due to cutting her lasix. She has  started to feel dizzy and not sure what is causing it. She does not take her BP at home. She continues to check her surgar at [facility] and stated that it can be all over the place, she des like sweets so that will paly a roll.      Expected Outcomes Short term: montior her weight and blood pressure.   long term: continue to monitor weight and BP and exercise to help with stress Short term: montior her weight and blood pressure.   long term: continue to monitor weight and BP and exercise to  help with stress               Core Components/Risk Factors/Patient Goals at Discharge (Final Review):   Goals and Risk Factor Review - 08/13/23 0946       Core Components/Risk Factors/Patient Goals Review   Personal Goals Review Weight Management/Obesity;Diabetes;Improve shortness of breath with ADL's;Stress;Hypertension    Review Lurline continues to monitor her weight, she still continues to have fluxuating weight due to cutting her lasix. She has started to feel dizzy and not sure what is causing it. She does not take her BP at home. She continues to check her surgar at [facility] and stated that it can be all over the place, she des like sweets so that will paly a roll.    Expected Outcomes Short term: montior her weight and blood pressure.   long term: continue to monitor weight and BP and exercise to help with stress             ITP Comments:  ITP Comments     Row Name 07/09/23 1203 07/10/23 0933 07/11/23 0755 07/23/23 0956 08/08/23 0650   ITP Comments Completed virtual orientation today.  EP evaluation is scheduled for 07/10/23 at 830.  Documentation for diagnosis can be found in [facility] encounter 05/14/23 and OV 06/04/23. Patient attend orientation today.  Patient is attendingCardiac Rehabilitation Program.  Documentation for diagnosis can be found in [facility] 05/13/53.  Reviewed medical chart, RPE/RPD, gym safety, and program guidelines.  Patient was fitted to equipment they will be using during rehab.  Patient is  scheduled to start exercise on 07/13/23.   Initial ITP created and sent for review and signature by Dr. Dina Rich, Medical Director for Cardiac Rehabilitation Program. 30 day review completed. ITP sent to Dr. Dina Rich, Medical Director of Cardiac Rehab. Continue with ITP unless changes are made by physician.  Pt just oriented 07/11/23. Went over home exercise today. Jearline said that he dad has equipement at his home and she will starting going over there to workout with him. She is also going to start walking 30 day review completed. ITP sent to Dr. Dina Rich, Medical Director of Cardiac Rehab. Continue with ITP unless changes are made by physician.    Row Name 09/05/23 0651           ITP Comments 30 day review completed. ITP sent to Dr. Dina Rich, Medical Director of Cardiac Rehab. Continue with ITP unless changes are made by physician.                Comments: 30 day review

## 2023-09-05 NOTE — Telephone Encounter (Signed)
Called patient   Says she feels like when this happens she feels like she has to hold on or she will fall COmes and goes     Just came out of cardiac rehab   Had to hold on   If bends will fall  Blood pressure at cardiac rehab  140/90   On leaving 120/62 Drinking lots of  fluids, water  This is different from Vertigo  INitially after TAVR breathing was great   Now has to stop to catch breath  Pt says she eats then she has to throw up   NOt every day  But 2 days    Needs limited echo to reevaluate AV prosthesis   See if it can be added on today as symptoms are concerning Check CBC, BMET  today  Let me know if have problems

## 2023-09-07 ENCOUNTER — Ambulatory Visit (HOSPITAL_BASED_OUTPATIENT_CLINIC_OR_DEPARTMENT_OTHER)
Admission: RE | Admit: 2023-09-07 | Discharge: 2023-09-07 | Disposition: A | Discharge status: Home / Self Care | Payer: 59 | Source: Ambulatory Visit | Attending: Internal Medicine | Admitting: Internal Medicine

## 2023-09-07 ENCOUNTER — Encounter (HOSPITAL_COMMUNITY)
Admission: RE | Admit: 2023-09-07 | Discharge: 2023-09-07 | Disposition: A | Discharge status: Home / Self Care | Payer: 59 | Source: Ambulatory Visit | Attending: Internal Medicine

## 2023-09-07 DIAGNOSIS — Z952 Presence of prosthetic heart valve: Secondary | ICD-10-CM | POA: Insufficient documentation

## 2023-09-07 LAB — ECHOCARDIOGRAM LIMITED
AR max vel: 1.38 cm2
AV Area VTI: 1.51 cm2
AV Area mean vel: 1.55 cm2
AV Mean grad: 5 mm[Hg]
AV Peak grad: 10.9 mm[Hg]
Ao pk vel: 1.65 m/s
S' Lateral: 2 cm

## 2023-09-07 NOTE — Progress Notes (Signed)
*  PRELIMINARY RESULTS* Echocardiogram Limited 2-D Echocardiogram  has been performed.  Stacy Frost 09/07/2023, 11:29 AM

## 2023-09-07 NOTE — Progress Notes (Signed)
Daily Session Note  Patient Details  Name: Kamella Groshans MRN: [id number] Date of Birth: [birth year] Referring Provider:   Flowsheet Row CARDIAC REHAB PHASE II ORIENTATION from 07/10/2023 in [facility] CARDIAC REHABILITATION  Referring Provider Dietrich Pates MD       Encounter Date: 09/07/2023  Check In:  Session Check In - 09/07/23 0929       Check-In   Supervising physician immediately available to respond to emergencies See telemetry face sheet for immediately available MD    Location [facility]    Staff Present Rodena Medin, RN, BSN;Mistie Adney Juanetta Gosling, MA, RCEP, CCRP, CCET;Other   Ulanda Edison RN   Virtual Visit No    Medication changes reported     No    Fall or balance concerns reported    No    Warm-up and Cool-down Performed on first and last piece of equipment    Resistance Training Performed Yes    VAD Patient? No    PAD/SET Patient? No      Pain Assessment   Currently in Pain? No/denies             Capillary Blood Glucose: No results found for this or any previous visit (from the past 24 hour(s)).    Social History   Tobacco Use  Smoking Status Never  Smokeless Tobacco Never    Goals Met:  Independence with exercise equipment Exercise tolerated well No report of concerns or symptoms today Strength training completed today  Goals Unmet:  Not Applicable  Comments: Pt able to follow exercise prescription today without complaint.  Will continue to monitor for progression.

## 2023-09-10 ENCOUNTER — Telehealth: Payer: Self-pay

## 2023-09-10 ENCOUNTER — Encounter (HOSPITAL_COMMUNITY)
Admission: RE | Admit: 2023-09-10 | Discharge: 2023-09-10 | Disposition: A | Discharge status: Home / Self Care | Payer: 59 | Source: Ambulatory Visit | Attending: Internal Medicine

## 2023-09-10 VITALS — Ht 65.5 in | Wt 195.4 lb

## 2023-09-10 DIAGNOSIS — Z952 Presence of prosthetic heart valve: Secondary | ICD-10-CM | POA: Diagnosis not present

## 2023-09-10 MED ORDER — METOPROLOL SUCCINATE ER 100 MG PO TB24: 50.0000 mg | ORAL_TABLET | Freq: Every day | ORAL | 3 refills | Status: DC

## 2023-09-10 NOTE — Telephone Encounter (Signed)
Echo results discussed with patient,she agrees to decrease toprol to 50 mg and see how she feels.She will also increase her oral intake,pcp copied

## 2023-09-10 NOTE — Progress Notes (Signed)
Daily Session Note  Patient Details  Name: Stacy Frost MRN: [id number] Date of Birth: 08/29/1961 Referring Provider:   Flowsheet Row CARDIAC REHAB PHASE II ORIENTATION from 07/10/2023 in [facility] CARDIAC REHABILITATION  Referring Provider Dietrich Pates MD       Encounter Date: 09/10/2023  Check In:  Session Check In - 09/10/23 0900       Check-In   Supervising physician immediately available to respond to emergencies See telemetry face sheet for immediately available ER MD    Location [facility]    Staff Present Fabio Pierce, MA, RCEP, CCRP, Dow Adolph, RN, BSN    Virtual Visit No    Medication changes reported     No    Fall or balance concerns reported    No    Warm-up and Cool-down Performed on first and last piece of equipment    Resistance Training Performed Yes    VAD Patient? No    PAD/SET Patient? No      Pain Assessment   Currently in Pain? No/denies    Multiple Pain Sites No             Capillary Blood Glucose: No results found for this or any previous visit (from the past 24 hour(s)).    Social History   Tobacco Use  Smoking Status Never  Smokeless Tobacco Never    Goals Met:  Independence with exercise equipment Exercise tolerated well No report of concerns or symptoms today Strength training completed today  Goals Unmet:  Not Applicable  Comments: Pt able to follow exercise prescription today without complaint.  Will continue to monitor for progression.

## 2023-09-10 NOTE — Telephone Encounter (Signed)
-----   Message from Nurse Dewayne Hatch B sent at 09/10/2023  8:10 AM EST -----  ----- Message ----- From: Pricilla Riffle, MD Sent: 09/09/2023  11:27 PM EST To: Bertram Millard, RN  Echo shows pumping function of her heart is normal AV prosthesis is working good   Re symptoms:   Hydrate more      Could cut bak on mtoprolol to 1/2 tab per day    CContine for follow up with response

## 2023-09-10 NOTE — Patient Instructions (Signed)
Discharge Patient Instructions  Patient Details  Name: Stacy Frost MRN: [id number] Date of Birth: 01/31/1961 Referring Provider:  Kirstie Peri, MD   Number of Visits: 19  Reason for Discharge:  Patient reached a stable level of exercise. Patient independent in their exercise. Patient has met program and personal goals.  Smoking History:  Social History   Tobacco Use  Smoking Status Never  Smokeless Tobacco Never    Diagnosis:  S/P AVR (aortic valve replacement)  Initial Exercise Prescription:  Initial Exercise Prescription - 07/10/23 0900       Date of Initial Exercise RX and Referring Provider   Date 07/10/23    Referring Provider Dietrich Pates MD      Oxygen   Maintain Oxygen Saturation 88% or higher      Treadmill   MPH 1.2    Grade 0.5    Minutes 15    METs 2      REL-XR   Level 1    Speed 50    Minutes 15    METs 2      Prescription Details   Frequency (times per week) 3    Duration Progress to 30 minutes of continuous aerobic without signs/symptoms of physical distress      Intensity   THRR 40-80% of Max Heartrate 110-141    Ratings of Perceived Exertion 11-13    Perceived Dyspnea 0-4      Progression   Progression Continue to progress workloads to maintain intensity without signs/symptoms of physical distress.      Resistance Training   Training Prescription Yes    Weight 3 lb    Reps 10-15             Discharge Exercise Prescription (Final Exercise Prescription Changes):  Exercise Prescription Changes - 09/03/23 1200       Response to Exercise   Blood Pressure (Admit) 104/62    Blood Pressure (Exit) 122/62    Heart Rate (Admit) 107 bpm    Heart Rate (Exercise) 125 bpm    Heart Rate (Exit) 119 bpm    Rating of Perceived Exertion (Exercise) 12    Duration Continue with 30 min of aerobic exercise without signs/symptoms of physical distress.    Intensity THRR unchanged      Progression   Progression Continue to progress workloads  to maintain intensity without signs/symptoms of physical distress.      Resistance Training   Training Prescription Yes    Weight 3 lbs    Reps 10-15      REL-XR   Level 4    Speed 53    Minutes 30    METs 2.9      Home Exercise Plan   Plans to continue exercise at Home (comment)    Frequency Add 2 additional days to program exercise sessions.      Oxygen   Maintain Oxygen Saturation 88% or higher             Functional Capacity:  6 Minute Walk     Row Name 07/10/23 0937 09/10/23 0934       6 Minute Walk   Phase Initial Discharge    Distance 870 feet 1140 feet    Distance % Change -- 23.7 %    Distance Feet Change -- 270 ft    Walk Time 6 minutes 6 minutes    # of Rest Breaks -- 0    MPH 1.65 2.16    METS 2.36 2.93  RPE 12 17    Perceived Dyspnea  1 --    VO2 Peak 8.25 10.26    Symptoms Yes (comment) Yes (comment)    Comments SOB, chronic bilateral hip pain 7/10 fatigue    Resting HR 78 bpm 87 bpm    Resting BP 124/64 126/72    Resting Oxygen Saturation  98 % --    Exercise Oxygen Saturation  during 6 min walk 97 % --    Max Ex. HR 108 bpm 117 bpm    Max Ex. BP 134/64 136/74    2 Minute Post BP 118/66 --               Personal Goals: Goals established at orientation with interventions provided to work toward goal.      Nutrition & Weight - Outcomes:  Pre Biometrics - 07/10/23 0947       Pre Biometrics   Height 5\' 5"  (1.651 m)    Weight 87.1 kg    Waist Circumference 36 inches    Hip Circumference 45 inches    Waist to Hip Ratio 0.8 %    BMI (Calculated) 31.95    Grip Strength 21.5 kg    Single Leg Stand 10.9 seconds             Post Biometrics - 09/10/23 0935        Post  Biometrics   Height 5' 5.5" (1.664 m)    Weight 88.6 kg    Waist Circumference 36 inches    Hip Circumference 46 inches    Waist to Hip Ratio 0.78 %    BMI (Calculated) 32.01    Grip Strength 28.2 kg    Single Leg Stand 5 seconds

## 2023-09-12 ENCOUNTER — Telehealth: Payer: Self-pay | Admitting: Internal Medicine

## 2023-09-12 ENCOUNTER — Encounter (HOSPITAL_COMMUNITY)
Admission: RE | Admit: 2023-09-12 | Discharge: 2023-09-12 | Disposition: A | Discharge status: Home / Self Care | Payer: 59 | Source: Ambulatory Visit | Attending: Internal Medicine | Admitting: Internal Medicine

## 2023-09-12 DIAGNOSIS — Z952 Presence of prosthetic heart valve: Secondary | ICD-10-CM

## 2023-09-12 NOTE — Telephone Encounter (Signed)
   Name: Stacy Frost  DOB: 25-Jan-1961  MRN: [id number]  Primary Cardiologist: None   Preoperative team, please contact this patient and set up a phone call appointment for further preoperative risk assessment. Please obtain consent and complete medication review. Thank you for your help. Seen by Sharlene Dory on 07/17/2023.  I confirm that guidance regarding antiplatelet and oral anticoagulation therapy has been completed and, if necessary, noted below.  Per office protocol, if patient is without any new symptoms or concerns at the time of their virtual visit, he/she may hold aspirin for 7 days prior to procedure. Please resume at [place] as soon as possible postprocedure, at the discretion of the surgeon.    I also confirmed the patient resides in the state of [place]. As per [facility] Medical Board telemedicine laws, the patient must reside in the state in which the provider is licensed.   Joni Reining, NP 09/12/2023, 11:53 AM [facility]

## 2023-09-12 NOTE — Progress Notes (Signed)
Daily Session Note  Patient Details  Name: Stacy Frost MRN: [id number] Date of Birth: 1961/08/13 Referring Provider:   Flowsheet Row CARDIAC REHAB PHASE II ORIENTATION from 07/10/2023 in [facility] CARDIAC REHABILITATION  Referring Provider Dietrich Pates MD       Encounter Date: 09/12/2023  Check In:  Session Check In - 09/12/23 3244       Check-In   Supervising physician immediately available to respond to emergencies See telemetry face sheet for immediately available MD    Location [facility]    Staff Present Ross Ludwig, BS, Exercise Physiologist;Monicka Cyran Juanetta Gosling, MA, RCEP, CCRP, CCET;Other   Ula Lingo RN   Virtual Visit No    Medication changes reported     No    Fall or balance concerns reported    No    Warm-up and Cool-down Performed on first and last piece of equipment    Resistance Training Performed Yes    VAD Patient? No    PAD/SET Patient? No      Pain Assessment   Currently in Pain? No/denies             Capillary Blood Glucose: No results found for this or any previous visit (from the past 24 hour(s)).    Social History   Tobacco Use  Smoking Status Never  Smokeless Tobacco Never    Goals Met:  Independence with exercise equipment Exercise tolerated well No report of concerns or symptoms today Strength training completed today  Goals Unmet:  Not Applicable  Comments: Pt able to follow exercise prescription today without complaint.  Will continue to monitor for progression.

## 2023-09-12 NOTE — Telephone Encounter (Signed)
Ok to wait for office appt to clear. Please add this to appt notes. TY

## 2023-09-12 NOTE — Telephone Encounter (Signed)
   Pre-operative Risk Assessment    Patient Name: Stacy Frost  DOB: June 28, 1961 MRN: [id number]     Request for Surgical Clearance    Procedure:  Dental Extraction - Amount of Teeth to be Pulled:  3 #19, 21, 22  Date of Surgery:  Clearance TBD                                 Surgeon:  Dr. Alfonse Flavors Surgeon's Group or Practice Name:  Not indicated Phone number:  [phone] Fax number:  [phone]   Type of Clearance Requested:   - Medical  - Pharmacy:  Hold if applicable      Type of Anesthesia:   4% septocaine w/ 1:60,000   Additional requests/questions:   Extractions may be surgical  Signed, Seymour Bars   09/12/2023, 8:58 AM

## 2023-09-12 NOTE — Telephone Encounter (Signed)
Pt has appt with Sharlene Dory, NP 09/25/23. Procedure is TBD. Will d/w Preop APP ok to defer clearance to appt 09/25/23.

## 2023-09-14 ENCOUNTER — Encounter (HOSPITAL_COMMUNITY)
Admission: RE | Admit: 2023-09-14 | Discharge: 2023-09-14 | Disposition: A | Discharge status: Home / Self Care | Payer: 59 | Source: Ambulatory Visit | Attending: Internal Medicine | Admitting: Internal Medicine

## 2023-09-14 DIAGNOSIS — Z952 Presence of prosthetic heart valve: Secondary | ICD-10-CM

## 2023-09-14 NOTE — Progress Notes (Signed)
Daily Session Note  Patient Details  Name: Stacy Frost MRN: [id number] Date of Birth: Nov 27, 1960 Referring Provider:   Flowsheet Row CARDIAC REHAB PHASE II ORIENTATION from 07/10/2023 in [facility] CARDIAC REHABILITATION  Referring Provider Dietrich Pates MD       Encounter Date: 09/14/2023  Check In:  Session Check In - 09/14/23 0917       Check-In   Supervising physician immediately available to respond to emergencies See telemetry face sheet for immediately available MD    Location [facility]    Staff Present Ross Ludwig, BS, Exercise Physiologist;Deondra Wigger Alexander, MA, RCEP, CCRP, Dow Adolph, RN, BSN    Virtual Visit No    Medication changes reported     No    Fall or balance concerns reported    Yes    Comments Pt fell out of bed on Wednesday night.  She bruised her left side and knocked things off the night stand.  She is still sore some today.    Warm-up and Cool-down Performed on first and last piece of equipment    Resistance Training Performed Yes    VAD Patient? No    PAD/SET Patient? No      Pain Assessment   Currently in Pain? Yes    Pain Score 2     Pain Location Other (Comment)    Pain Orientation Left    Pain Descriptors / Indicators Aching    Pain Type Other (Comment)   from fall   Pain Onset In the past 7 days    Pain Frequency Intermittent    Effect of Pain on Daily Activities just movign more gingerly             Capillary Blood Glucose: No results found for this or any previous visit (from the past 24 hour(s)).    Social History   Tobacco Use  Smoking Status Never  Smokeless Tobacco Never    Goals Met:  Independence with exercise equipment Exercise tolerated well No report of concerns or symptoms today Strength training completed today  Goals Unmet:  Not Applicable  Comments: Pt able to follow exercise prescription today without complaint.  Will continue to monitor for progression.

## 2023-09-17 ENCOUNTER — Encounter (HOSPITAL_COMMUNITY)
Admission: RE | Admit: 2023-09-17 | Discharge: 2023-09-17 | Disposition: A | Discharge status: Home / Self Care | Payer: 59 | Source: Ambulatory Visit | Attending: Internal Medicine | Admitting: Internal Medicine

## 2023-09-17 DIAGNOSIS — Z952 Presence of prosthetic heart valve: Secondary | ICD-10-CM | POA: Diagnosis not present

## 2023-09-17 NOTE — Progress Notes (Signed)
Daily Session Note  Patient Details  Name: Stacy Frost MRN: [id number] Date of Birth: 1961-09-23 Referring Provider:   Flowsheet Row CARDIAC REHAB PHASE II ORIENTATION from 07/10/2023 in [facility] CARDIAC REHABILITATION  Referring Provider Dietrich Pates MD       Encounter Date: 09/17/2023  Check In:  Session Check In - 09/17/23 0915       Check-In   Supervising physician immediately available to respond to emergencies See telemetry face sheet for immediately available MD    Location [facility]    Staff Present Ross Ludwig, BS, Exercise Physiologist;Jessica Juanetta Gosling, MA, RCEP, CCRP, CCET    Virtual Visit No    Medication changes reported     No    Fall or balance concerns reported    No    Tobacco Cessation No Change    Warm-up and Cool-down Performed on first and last piece of equipment    Resistance Training Performed Yes    VAD Patient? No    PAD/SET Patient? No      Pain Assessment   Currently in Pain? No/denies    Pain Score 0-No pain    Multiple Pain Sites No             Capillary Blood Glucose: No results found for this or any previous visit (from the past 24 hour(s)).    Social History   Tobacco Use  Smoking Status Never  Smokeless Tobacco Never    Goals Met:  Independence with exercise equipment Exercise tolerated well No report of concerns or symptoms today Strength training completed today  Goals Unmet:  Not Applicable  Comments: Pt able to follow exercise prescription today without complaint.  Will continue to monitor for progression.

## 2023-09-19 ENCOUNTER — Encounter (HOSPITAL_COMMUNITY)
Admission: RE | Admit: 2023-09-19 | Discharge: 2023-09-19 | Disposition: A | Discharge status: Home / Self Care | Payer: 59 | Source: Ambulatory Visit | Attending: Internal Medicine | Admitting: Internal Medicine

## 2023-09-19 DIAGNOSIS — Z952 Presence of prosthetic heart valve: Secondary | ICD-10-CM

## 2023-09-19 LAB — GLUCOSE, CAPILLARY: Glucose-Capillary: 300 mg/dL — ABNORMAL HIGH (ref 70–99)

## 2023-09-19 NOTE — Progress Notes (Signed)
Discharge Progress Report  Patient Details  Name: Stacy Frost MRN: [id number] Date of Birth: 05/26/1961 Referring Provider:   Flowsheet Row CARDIAC REHAB PHASE II ORIENTATION from 07/10/2023 in [facility] CARDIAC REHABILITATION  Referring Provider Dietrich Pates MD        Number of Visits: 36  Reason for Discharge:  Patient reached a stable level of exercise. Patient independent in their exercise. Patient has met program and personal goals.  Smoking History:  Social History   Tobacco Use  Smoking Status Never  Smokeless Tobacco Never    Diagnosis:  S/P AVR (aortic valve replacement)  Initial Exercise Prescription:  Initial Exercise Prescription - 07/10/23 0900       Date of Initial Exercise RX and Referring Provider   Date 07/10/23    Referring Provider Dietrich Pates MD      Oxygen   Maintain Oxygen Saturation 88% or higher      Treadmill   MPH 1.2    Grade 0.5    Minutes 15    METs 2      REL-XR   Level 1    Speed 50    Minutes 15    METs 2      Prescription Details   Frequency (times per week) 3    Duration Progress to 30 minutes of continuous aerobic without signs/symptoms of physical distress      Intensity   THRR 40-80% of Max Heartrate 110-141    Ratings of Perceived Exertion 11-13    Perceived Dyspnea 0-4      Progression   Progression Continue to progress workloads to maintain intensity without signs/symptoms of physical distress.      Resistance Training   Training Prescription Yes    Weight 3 lb    Reps 10-15             Discharge Exercise Prescription (Final Exercise Prescription Changes):  Exercise Prescription Changes - 09/03/23 1200       Response to Exercise   Blood Pressure (Admit) 104/62    Blood Pressure (Exit) 122/62    Heart Rate (Admit) 107 bpm    Heart Rate (Exercise) 125 bpm    Heart Rate (Exit) 119 bpm    Rating of Perceived Exertion (Exercise) 12    Duration Continue with 30 min of aerobic exercise without  signs/symptoms of physical distress.    Intensity THRR unchanged      Progression   Progression Continue to progress workloads to maintain intensity without signs/symptoms of physical distress.      Resistance Training   Training Prescription Yes    Weight 3 lbs    Reps 10-15      REL-XR   Level 4    Speed 53    Minutes 30    METs 2.9      Home Exercise Plan   Plans to continue exercise at Home (comment)    Frequency Add 2 additional days to program exercise sessions.      Oxygen   Maintain Oxygen Saturation 88% or higher             Functional Capacity:  6 Minute Walk     Row Name 07/10/23 0937 09/10/23 0934       6 Minute Walk   Phase Initial Discharge    Distance 870 feet 1140 feet    Distance % Change -- 23.7 %    Distance Feet Change -- 270 ft    Walk Time 6 minutes 6 minutes    #  of Rest Breaks -- 0    MPH 1.65 2.16    METS 2.36 2.93    RPE 12 17    Perceived Dyspnea  1 --    VO2 Peak 8.25 10.26    Symptoms Yes (comment) Yes (comment)    Comments SOB, chronic bilateral hip pain 7/10 fatigue    Resting HR 78 bpm 87 bpm    Resting BP 124/64 126/72    Resting Oxygen Saturation  98 % --    Exercise Oxygen Saturation  during 6 min walk 97 % --    Max Ex. HR 108 bpm 117 bpm    Max Ex. BP 134/64 136/74    2 Minute Post BP 118/66 --             Psychological, QOL, Others - Outcomes: PHQ 2/9:    09/14/2023    9:35 AM 07/10/2023    8:18 AM  Depression screen PHQ 2/9  Decreased Interest 0 0  Down, Depressed, Hopeless 0 0  PHQ - 2 Score 0 0  Altered sleeping 0 0  Tired, decreased energy 2 0  Change in appetite 0 0  Feeling bad or failure about yourself  0 0  Trouble concentrating 0 0  Moving slowly or fidgety/restless 0 0  Suicidal thoughts 0 0  PHQ-9 Score 2 0  Difficult doing work/chores Somewhat difficult Not difficult at all    Quality of Life:  Quality of Life - 09/14/23 0933       Quality of Life   Select Quality of Life       Quality of Life Scores   Health/Function Pre 20.25 %    Health/Function Post 22.7 %    Health/Function % Change 12.1 %    Socioeconomic Pre 25.08 %    Socioeconomic Post 27.86 %    Socioeconomic % Change  11.08 %    Psych/Spiritual Pre 24.36 %    Psych/Spiritual Post 28.93 %    Psych/Spiritual % Change 18.76 %    Family Pre 21.6 %    Family Post 26.4 %    Family % Change 22.22 %    GLOBAL Pre 22.27 %    GLOBAL Post 25.62 %    GLOBAL % Change 15.04 %                   Nutrition & Weight - Outcomes:  Pre Biometrics - 07/10/23 0947       Pre Biometrics   Height 5\' 5"  (1.651 m)    Weight 87.1 kg    Waist Circumference 36 inches    Hip Circumference 45 inches    Waist to Hip Ratio 0.8 %    BMI (Calculated) 31.95    Grip Strength 21.5 kg    Single Leg Stand 10.9 seconds             Post Biometrics - 09/10/23 0935        Post  Biometrics   Height 5' 5.5" (1.664 m)    Weight 88.6 kg    Waist Circumference 36 inches    Hip Circumference 46 inches    Waist to Hip Ratio 0.78 %    BMI (Calculated) 32.01    Grip Strength 28.2 kg    Single Leg Stand 5 seconds

## 2023-09-19 NOTE — Progress Notes (Signed)
Stacy Frost graduated today from  rehab with 36 sessions completed.  Details of the patient's exercise prescription and what She needs to do in order to continue the prescription and progress were discussed with patient.  Patient was given a copy of prescription and goals.  Patient verbalized understanding. Stacy Frost plans to continue to exercise by walking at home.

## 2023-09-19 NOTE — Progress Notes (Signed)
Cardiac Individual Treatment Plan  Patient Details  Name: Stacy Frost MRN: [id number] Date of Birth: 04-10-1961 Referring Provider:   Flowsheet Row CARDIAC REHAB PHASE II ORIENTATION from 07/10/2023 in [facility] CARDIAC REHABILITATION  Referring Provider Dietrich Pates MD       Initial Encounter Date:  Flowsheet Row CARDIAC REHAB PHASE II ORIENTATION from 07/10/2023 in [place] CARDIAC REHABILITATION  Date 07/10/23       Visit Diagnosis: S/P AVR (aortic valve replacement)  Patient's Home Medications on Admission:  Current Outpatient Medications:    aspirin EC 325 MG tablet, Take 1 tablet (325 mg total) by mouth daily., Disp: , Rfl:    atorvastatin (LIPITOR) 80 MG tablet, Take 1 tablet (80 mg total) by mouth daily., Disp: 30 tablet, Rfl: 5   celecoxib (CELEBREX) 200 MG capsule, Take 200 mg by mouth 2 (two) times daily., Disp: , Rfl:    citalopram (CELEXA) 40 MG tablet, Take 40 mg by mouth daily., Disp: , Rfl:    clonazePAM (KLONOPIN) 0.5 MG tablet, Take 0.5 mg by mouth 2 (two) times daily as needed., Disp: , Rfl:    Compression Bandages KIT, 1 each by Does not apply route daily as needed., Disp: 1 kit, Rfl: 0   Continuous Blood Gluc Receiver (DEXCOM G7 RECEIVER) DEVI, USE 1 receiver continuously, Disp: , Rfl:    Continuous Blood Gluc Sensor (DEXCOM G7 SENSOR) MISC, APPLY 1 SENSOR EVERY 10 DAYS, Disp: , Rfl:    cyclobenzaprine (FLEXERIL) 10 MG tablet, Take 10 mg by mouth 3 (three) times daily., Disp: , Rfl:    ferrous sulfate 325 (65 FE) MG tablet, Take 1 tablet (325 mg total) by mouth daily with breakfast., Disp: 60 tablet, Rfl: 1   furosemide (LASIX) 40 MG tablet, Take 1 tablet (40 mg total) by mouth daily as needed (for swelling and weight gain)., Disp: 30 tablet, Rfl: 5   metFORMIN (GLUCOPHAGE-XR) 500 MG 24 hr tablet, Take 2 tablets (1,000 mg total) by mouth daily with breakfast., Disp: 180 tablet, Rfl: 1   metoprolol succinate (TOPROL-XL) 100 MG 24 hr tablet, Take 0.5 tablets (50  mg total) by mouth daily. Take with or immediately following a meal., Disp: 90 tablet, Rfl: 3   potassium chloride SA (KLOR-CON M) 20 MEQ tablet, Take 1 tablet (20 mEq total) by mouth daily as needed., Disp: , Rfl:    pramipexole (MIRAPEX) 0.5 MG tablet, Take 0.5 mg by mouth at bedtime., Disp: , Rfl:    promethazine (PHENERGAN) 25 MG tablet, Take 25 mg by mouth 2 (two) times daily as needed., Disp: , Rfl:    tirzepatide (MOUNJARO) 7.5 MG/0.5ML Pen, Inject 7.5 mg into the skin once a week., Disp: 6 mL, Rfl: 1   traZODone (DESYREL) 100 MG tablet, Take 100 mg by mouth at bedtime., Disp: , Rfl:   Past Medical History: Past Medical History:  Diagnosis Date   Anxiety    Aortic stenosis    Mild to moderate   Bicuspid aortic valve    Essential hypertension, benign    GERD (gastroesophageal reflux disease)    History of cardiac catheterization    Normal coronaries 2008   Hyperlipidemia    Migraine headache    MVP (mitral valve prolapse)    Obesity    Pain in joint, ankle and foot    Chronic   Palpitations    Documented PACs by previous monitoring   Restless leg syndrome    Skin tag    Type 2 diabetes mellitus (HCC)  Unilateral primary osteoarthritis, right knee 09/06/2016    Tobacco Use: Social History   Tobacco Use  Smoking Status Never  Smokeless Tobacco Never    Labs: Review Flowsheet  More data exists      Latest Ref Rng & Units 07/25/2022 11/29/2022 05/02/2023 05/13/2023 05/14/2023  Labs for ITP Cardiac and Pulmonary Rehab  Hemoglobin A1c 4.8 - 5.6 % 7.8  6.2  - 7.4  7.4   PH, Arterial 7.35 - 7.45 - - 7.376  - 7.351  7.336  7.378  7.436  7.405  7.389  7.385  7.5   PCO2 arterial 32 - 48 mmHg - - 35.7  - 37.6  41.5  41.5  36.3  41.5  43.6  46.1  37   Bicarbonate 20.0 - 28.0 mmol/L - - 22.4  22.3  20.9  - 20.7  22.1  24.5  24.4  26.1  27.9  26.3  27.6  28.9   TCO2 22 - 32 mmol/L - - 24  24  22   - 22  23  26  26  29  27  27  30  29  28  29  29  29    Acid-base deficit 0.0 - 2.0  mmol/L - - 3.0  3.0  4.0  - 4.0  3.0  1.0   O2 Saturation % - - 67  66  94  - 88  98  98  100  100  84  100  99  96.4     Details       Multiple values from one day are sorted in reverse-chronological order         Capillary Blood Glucose: Lab Results  Component Value Date   GLUCAP 300 (H) 09/19/2023   GLUCAP 173 (H) 07/18/2023   GLUCAP 193 (H) 07/18/2023   GLUCAP 165 (H) 07/16/2023   GLUCAP 174 (H) 07/16/2023     Exercise Target Goals: Exercise Program Goal: Individual exercise prescription set using results from initial 6 min walk test and THRR while considering  patient's activity barriers and safety.   Exercise Prescription Goal: Starting with aerobic activity 30 plus minutes a day, 3 days per week for initial exercise prescription. Provide home exercise prescription and guidelines that participant acknowledges understanding prior to discharge.  Activity Barriers & Risk Stratification:  Activity Barriers & Cardiac Risk Stratification - 07/09/23 1150       Activity Barriers & Cardiac Risk Stratification   Activity Barriers Arthritis;Deconditioning;Muscular Weakness;Balance Concerns;History of Falls   bilateral hips arthritis   Cardiac Risk Stratification Moderate             6 Minute Walk:  6 Minute Walk     Row Name 07/10/23 0937 09/10/23 0934       6 Minute Walk   Phase Initial Discharge    Distance 870 feet 1140 feet    Distance % Change -- 23.7 %    Distance Feet Change -- 270 ft    Walk Time 6 minutes 6 minutes    # of Rest Breaks -- 0    MPH 1.65 2.16    METS 2.36 2.93    RPE 12 17    Perceived Dyspnea  1 --    VO2 Peak 8.25 10.26    Symptoms Yes (comment) Yes (comment)    Comments SOB, chronic bilateral hip pain 7/10 fatigue    Resting HR 78 bpm 87 bpm    Resting BP 124/64 126/72    Resting Oxygen Saturation  98 % --    Exercise Oxygen Saturation  during 6 min walk 97 % --    Max Ex. HR 108 bpm 117 bpm    Max Ex. BP 134/64 136/74    2  Minute Post BP 118/66 --             Oxygen Initial Assessment:   Oxygen Re-Evaluation:   Oxygen Discharge (Final Oxygen Re-Evaluation):   Initial Exercise Prescription:  Initial Exercise Prescription - 07/10/23 0900       Date of Initial Exercise RX and Referring Provider   Date 07/10/23    Referring Provider Dietrich Pates MD      Oxygen   Maintain Oxygen Saturation 88% or higher      Treadmill   MPH 1.2    Grade 0.5    Minutes 15    METs 2      REL-XR   Level 1    Speed 50    Minutes 15    METs 2      Prescription Details   Frequency (times per week) 3    Duration Progress to 30 minutes of continuous aerobic without signs/symptoms of physical distress      Intensity   THRR 40-80% of Max Heartrate 110-141    Ratings of Perceived Exertion 11-13    Perceived Dyspnea 0-4      Progression   Progression Continue to progress workloads to maintain intensity without signs/symptoms of physical distress.      Resistance Training   Training Prescription Yes    Weight 3 lb    Reps 10-15             Perform Capillary Blood Glucose checks as needed.  Exercise Prescription Changes:   Exercise Prescription Changes     Row Name 07/10/23 0900 07/23/23 0900 07/23/23 1400 08/13/23 1300 09/03/23 1200     Response to Exercise   Blood Pressure (Admit) 124/64 -- 124/78 122/66 104/62   Blood Pressure (Exercise) 134/64 -- 182/80 -- --   Blood Pressure (Exit) 118/66 -- 128/80 106/68 122/62   Heart Rate (Admit) 78 bpm -- 87 bpm 80 bpm 107 bpm   Heart Rate (Exercise) 108 bpm -- 115 bpm 109 bpm 125 bpm   Heart Rate (Exit) 86 bpm -- 96 bpm 82 bpm 119 bpm   Oxygen Saturation (Admit) 98 % -- -- -- --   Oxygen Saturation (Exercise) 97 % -- -- -- --   Rating of Perceived Exertion (Exercise) 12 -- 15 15 12    Perceived Dyspnea (Exercise) 1 -- -- -- --   Symptoms SOB, chronic bilateral hip pain 7/10 -- -- -- --   Comments walk test results -- -- -- --   Duration -- --  Continue with 30 min of aerobic exercise without signs/symptoms of physical distress. Continue with 30 min of aerobic exercise without signs/symptoms of physical distress. Continue with 30 min of aerobic exercise without signs/symptoms of physical distress.   Intensity -- -- THRR unchanged THRR unchanged THRR unchanged     Progression   Progression -- -- Continue to progress workloads to maintain intensity without signs/symptoms of physical distress. Continue to progress workloads to maintain intensity without signs/symptoms of physical distress. Continue to progress workloads to maintain intensity without signs/symptoms of physical distress.     Resistance Training   Training Prescription -- -- Yes Yes Yes   Weight -- -- 3 lbs 3 3 lbs   Reps -- -- 10-15 10-15 10-15   Time -- --  10 Minutes -- --     Treadmill   MPH -- -- 1.9 -- --   Grade -- -- 0.5 -- --   Minutes -- -- 15 -- --   METs -- -- 2.59 -- --     REL-XR   Level -- -- 3 4 4    Speed -- -- 47 54 53   Minutes -- -- 15 30 30    METs -- -- 2.5 2.7 2.9     Home Exercise Plan   Plans to continue exercise at -- Home (comment) Home (comment) Home (comment) Home (comment)   Frequency -- Add 2 additional days to program exercise sessions. Add 2 additional days to program exercise sessions. Add 2 additional days to program exercise sessions. Add 2 additional days to program exercise sessions.   Initial Home Exercises Provided -- 07/23/23 -- -- --     Oxygen   Maintain Oxygen Saturation -- -- 88% or higher 88% or higher 88% or higher            Exercise Comments:   Exercise Goals and Review:   Exercise Goals     Row Name 07/10/23 0946             Exercise Goals   Increase Physical Activity Yes       Intervention Provide advice, education, support and counseling about physical activity/exercise needs.;Develop an individualized exercise prescription for aerobic and resistive training based on initial evaluation findings,  risk stratification, comorbidities and participant's personal goals.       Expected Outcomes Short Term: Attend rehab on a regular basis to increase amount of physical activity.;Long Term: Add in home exercise to make exercise part of routine and to increase amount of physical activity.;Long Term: Exercising regularly at least 3-5 days a week.       Increase Strength and Stamina Yes       Intervention Provide advice, education, support and counseling about physical activity/exercise needs.;Develop an individualized exercise prescription for aerobic and resistive training based on initial evaluation findings, risk stratification, comorbidities and participant's personal goals.       Expected Outcomes Short Term: Perform resistance training exercises routinely during rehab and add in resistance training at home;Short Term: Increase workloads from initial exercise prescription for resistance, speed, and METs.;Long Term: Improve cardiorespiratory fitness, muscular endurance and strength as measured by increased METs and functional capacity ( )       Able to understand and use rate of perceived exertion (RPE) scale Yes       Intervention Provide education and explanation on how to use RPE scale       Expected Outcomes Short Term: Able to use RPE daily in rehab to express subjective intensity level;Long Term:  Able to use RPE to guide intensity level when exercising independently       Able to understand and use Dyspnea scale Yes       Intervention Provide education and explanation on how to use Dyspnea scale       Expected Outcomes Short Term: Able to use Dyspnea scale daily in rehab to express subjective sense of shortness of breath during exertion;Long Term: Able to use Dyspnea scale to guide intensity level when exercising independently       Knowledge and understanding of Target Heart Rate Range (THRR) Yes       Intervention Provide education and explanation of THRR including how the numbers were  predicted and where they are located for reference  Expected Outcomes Short Term: Able to state/look up THRR;Long Term: Able to use THRR to govern intensity when exercising independently;Short Term: Able to use daily as guideline for intensity in rehab       Able to check pulse independently Yes       Intervention Provide education and demonstration on how to check pulse in carotid and radial arteries.;Review the importance of being able to check your own pulse for safety during independent exercise       Expected Outcomes Short Term: Able to explain why pulse checking is important during independent exercise;Long Term: Able to check pulse independently and accurately       Understanding of Exercise Prescription Yes       Intervention Provide education, explanation, and written materials on patient's individual exercise prescription       Expected Outcomes Short Term: Able to explain program exercise prescription;Long Term: Able to explain home exercise prescription to exercise independently                Exercise Goals Re-Evaluation :  Exercise Goals Re-Evaluation     Row Name 07/10/23 [phone] 07/20/23 0934 07/23/23 1416 08/13/23 0922 09/04/23 0752     Exercise Goal Re-Evaluation   Exercise Goals Review Able to understand and use rate of perceived exertion (RPE) scale;Able to understand and use Dyspnea scale Increase Physical Activity;Increase Strength and Stamina;Understanding of Exercise Prescription Increase Physical Activity;Increase Strength and Stamina;Understanding of Exercise Prescription Increase Physical Activity;Increase Strength and Stamina;Understanding of Exercise Prescription Increase Physical Activity;Able to understand and use rate of perceived exertion (RPE) scale;Understanding of Exercise Prescription   Comments Reviewed RPE  and dyspnea scale, THR and program prescription with pt today.  Pt voiced understanding and was given a copy of goals to take home. Wayne is doing  great in rehab. She starting to enjoying coming to rehab and exercising. She has started to increase her levels on both sets of equipment. She is starting to notice an increase of her endurance and stamina. Karelis is tolerating exercise well. She has increased her speed on the treadmill from the start of rehab to 1.9. She has also increased her level on the XR to level 3. Will continue to monitor and progress as able. Oniyah has been doing great in rehab with exercise, she has increased her level on the XR. She has started to feel dizzy the past two weeeks. She has been doing the XR twice instead of walking the treadmill. She has fell twice in the last two weeks as well. She did feel like she has more energy but due to the dizzyness she just isnt feeling her greatest. She is calling to get an appointment with her doctor. Ambellina has been doing good in rehab. She has been feeling dizzy latel y so she is not wanting to use the treadmill. She has been on the XR for both sessions and is on level 4 working an 12 RPE. Will continue to monitor and progress as able.   Expected Outcomes Short: Use RPE daily to regulate intensity.  Long: Follow program prescription in THR. Short : continue to increasing levels  long term: continue to attend rehab and enjoy coming. Short term: Continue to exercsie at workloads until RPE is 11 then increase    long term: continue to attend rehab Short: monitor the dizzyness and call doctor   long: continue to exercise at rehab Short: increase level on XR to push self   long term: continue to attend rehab.  Row Name 09/10/23 0940             Exercise Goal Re-Evaluation   Exercise Goals Review Increase Physical Activity;Increase Strength and Stamina;Understanding of Exercise Prescription       Comments Damitra is doing well in rehab.  She improved her post by 270 ft! She is getting close to graduation. She is planning to join [employer] in [place] and planning to go 3x week just like she  has been doing.       Expected Outcomes Short: COnitnue to work toward graduation Long: Continue to exercise indenpendently                 Discharge Exercise Prescription (Final Exercise Prescription Changes):  Exercise Prescription Changes - 09/03/23 1200       Response to Exercise   Blood Pressure (Admit) 104/62    Blood Pressure (Exit) 122/62    Heart Rate (Admit) 107 bpm    Heart Rate (Exercise) 125 bpm    Heart Rate (Exit) 119 bpm    Rating of Perceived Exertion (Exercise) 12    Duration Continue with 30 min of aerobic exercise without signs/symptoms of physical distress.    Intensity THRR unchanged      Progression   Progression Continue to progress workloads to maintain intensity without signs/symptoms of physical distress.      Resistance Training   Training Prescription Yes    Weight 3 lbs    Reps 10-15      REL-XR   Level 4    Speed 53    Minutes 30    METs 2.9      Home Exercise Plan   Plans to continue exercise at Home (comment)    Frequency Add 2 additional days to program exercise sessions.      Oxygen   Maintain Oxygen Saturation 88% or higher             Nutrition:  Target Goals: Understanding of nutrition guidelines, daily intake of sodium 1500mg , cholesterol 200mg , calories 30% from fat and 7% or less from saturated fats, daily to have 5 or more servings of fruits and vegetables.  Biometrics:  Pre Biometrics - 07/10/23 0947       Pre Biometrics   Height 5\' 5"  (1.651 m)    Weight 87.1 kg    Waist Circumference 36 inches    Hip Circumference 45 inches    Waist to Hip Ratio 0.8 %    BMI (Calculated) 31.95    Grip Strength 21.5 kg    Single Leg Stand 10.9 seconds             Post Biometrics - 09/10/23 0935        Post  Biometrics   Height 5' 5.5" (1.664 m)    Weight 88.6 kg    Waist Circumference 36 inches    Hip Circumference 46 inches    Waist to Hip Ratio 0.78 %    BMI (Calculated) 32.01    Grip Strength 28.2 kg     Single Leg Stand 5 seconds             Nutrition Therapy Plan and Nutrition Goals:  Nutrition Therapy & Goals - 07/09/23 1201       Intervention Plan   Intervention Prescribe, educate and counsel regarding individualized specific dietary modifications aiming towards targeted core components such as weight, hypertension, lipid management, diabetes, heart failure and other comorbidities.;Nutrition handout(s) given to patient.    Expected Outcomes  Short Term Goal: Understand basic principles of dietary content, such as calories, fat, sodium, cholesterol and nutrients.;Long Term Goal: Adherence to prescribed nutrition plan.             Nutrition Assessments:  MEDIFICTS Score Key: >=70 Need to make dietary changes  40-70 Heart Healthy Diet <= 40 Therapeutic Level Cholesterol Diet  Flowsheet Row CARDIAC REHAB PHASE II EXERCISE from 09/14/2023 in [facility] CARDIAC [facility]ITATION  Picture Your Plate Total Score on Admission 52  Picture Your Plate Total Score on Discharge 45      Picture Your Plate Scores: <40 Unhealthy dietary pattern with much room for improvement. 41-50 Dietary pattern unlikely to meet recommendations for good health and room for improvement. 51-60 More healthful dietary pattern, with some room for improvement.  >60 Healthy dietary pattern, although there may be some specific behaviors that could be improved.    Nutrition Goals Re-Evaluation:  Nutrition Goals Re-Evaluation     Row Name 07/23/23 [phone] 08/13/23 0933 09/10/23 0943         Goals   Nutrition Goal Healthy eating Healthy eating Short term: continue to cut back on sweets long term: focus on healthy eating     Comment Marisa is stated that she has been happy with what she is eating. During the week she stated that they do eat more red meat during the week (ground beef). Her sides are fires in the air fryer, baked potatos, green beans and peas. She said that it verires during the season and week  what side they eat. She does eat a lot of sweets during the week and it does mess with her surgar. She drinks zero surgar sodas and when she drinks water it is with flavoring packet. Carnelia is still happy with what she picks to eat. She is cutting back on her red meat and starting to have chicken within the week. She continues to air fry her sides. She does eat lots of veggies. She does eat sweets during the week. She also continues to drink zero surgat sodas. Winfred is doing well in rehab.  She continues to work on her diet.  She is trying to cut back on sweets still.  She has improved but wants to continue to work on it.  She is doing better with fruits and vegetables.     Expected Outcome Short term: cut out night time sweets   long term: focus on healthy eating Short term: continue to cut back on sweets   long term: focus on healthy eating Continue to focus on heart healthy eating              Nutrition Goals Discharge (Final Nutrition Goals Re-Evaluation):  Nutrition Goals Re-Evaluation - 09/10/23 0943       Goals   Nutrition Goal Short term: continue to cut back on sweets long term: focus on healthy eating    Comment Teiana is doing well in rehab.  She continues to work on her diet.  She is trying to cut back on sweets still.  She has improved but wants to continue to work on it.  She is doing better with fruits and vegetables.    Expected Outcome Continue to focus on heart healthy eating             Psychosocial: Target Goals: Acknowledge presence or absence of significant depression and/or stress, maximize coping skills, provide positive support system. Participant is able to verbalize types and ability to use techniques and skills needed  for reducing stress and depression.  Initial Review & Psychosocial Screening:  Initial Psych Review & Screening - 07/09/23 1151       Initial Review   Current issues with Current Psychotropic Meds;Current Anxiety/Panic;Current Depression;Current  Stress Concerns    Source of Stress Concerns Chronic Illness;Family    Comments brother just passed (buried on 07/08/23), recovering from surgery (limited on driving still 30 min at a time)      Family Dynamics   Good Support System? Yes   husband     Barriers   Psychosocial barriers to participate in program The patient should benefit from training in stress management and relaxation.;Psychosocial barriers identified (see note)      Screening Interventions   Interventions Encouraged to exercise;To provide support and resources with identified psychosocial needs;Provide feedback about the scores to participant    Expected Outcomes Short Term goal: Utilizing psychosocial counselor, staff and physician to assist with identification of specific Stressors or current issues interfering with healing process. Setting desired goal for each stressor or current issue identified.;Long Term Goal: Stressors or current issues are controlled or eliminated.;Short Term goal: Identification and review with participant of any Quality of Life or Depression concerns found by scoring the questionnaire.;Long Term goal: The participant improves quality of Life and PHQ9 Scores as seen by post scores and/or verbalization of changes             Quality of Life Scores:  Quality of Life - 09/14/23 0933       Quality of Life   Select Quality of Life      Quality of Life Scores   Health/Function Pre 20.25 %    Health/Function Post 22.7 %    Health/Function % Change 12.1 %    Socioeconomic Pre 25.08 %    Socioeconomic Post 27.86 %    Socioeconomic % Change  11.08 %    Psych/Spiritual Pre 24.36 %    Psych/Spiritual Post 28.93 %    Psych/Spiritual % Change 18.76 %    Family Pre 21.6 %    Family Post 26.4 %    Family % Change 22.22 %    GLOBAL Pre 22.27 %    GLOBAL Post 25.62 %    GLOBAL % Change 15.04 %            Scores of 19 and below usually indicate a poorer quality of life in these areas.  A  difference of  2-3 points is a clinically meaningful difference.  A difference of 2-3 points in the total score of the Quality of Life Index has been associated with significant improvement in overall quality of life, self-image, physical symptoms, and general health in studies assessing change in quality of life.  PHQ-9: Review Flowsheet       09/14/2023 07/10/2023  Depression screen PHQ 2/9  Decreased Interest 0 0  Down, Depressed, Hopeless 0 0  PHQ - 2 Score 0 0  Altered sleeping 0 0  Tired, decreased energy 2 0  Change in appetite 0 0  Feeling bad or failure about yourself  0 0  Trouble concentrating 0 0  Moving slowly or fidgety/restless 0 0  Suicidal thoughts 0 0  PHQ-9 Score 2 0  Difficult doing work/chores Somewhat difficult Not difficult at all    Details           Interpretation of Total Score  Total Score Depression Severity:  1-4 = Minimal depression, 5-9 = Mild depression, 10-14 = Moderate depression, 15-19 =  Moderately severe depression, 20-27 = Severe depression   Psychosocial Evaluation and Intervention:  Psychosocial Evaluation - 07/09/23 1157       Psychosocial Evaluation & Interventions   Interventions Stress management education;Relaxation education;Encouraged to exercise with the program and follow exercise prescription    Comments Chalyce is coming into 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  She is feeling good and excited to get going with program.  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 She just lost her brother last week and they buried him yesterday, but she is coping well with his death.  She does have a history of depression and anxiety, but feels well managed on her meds and knows they will work for her when she needs them.  Leading up to surgery, she did note that she got very clumbsy and had several falls.  She also slipped in kitchen yesterday while  mopping floors.  She is still restricted to on her driving, but otherwise feeling good from surgical standpoint.  She sleeps well and enjoys woodworking in her shop to relax.    Expected Outcomes Short: Attend rehab to build stamina Long: Continue to recover from surgery    Continue Psychosocial Services  Follow up required by staff             Psychosocial Re-Evaluation:  Psychosocial Re-Evaluation     Row Name 07/20/23 0949 08/13/23 0926 09/10/23 0941         Psychosocial Re-Evaluation   Current issues with None Identified Current Stress Concerns Current Stress Concerns     Comments Nishita has been doing great in rehab. She stated that she has been sleeping good and getting good night sleep. She does not have any major stressor in her life she stated. She is enjoying life and being with her family and grandkid Schyler has been doing great in rehab. She has only one major stressor and that is due to feeling dizzy the last two weeks. She is going to make an appointment to see what is causing the dizzyness. She continues to be in good spirits. Zeanna has enjoyed rehab and nearing graduation. She was pleased with her progress and 28% improvement on her post walk.  She is still feeling dizzy but forgot to mention to doctor but will go by today.  She continues to have a lot of stress.  Her daughter is still her biggest stressor as she is her primary caregiver.  She never knows what kind of mood she is going to be in.     Expected Outcomes Short term: continue to do do things she enjoys   long term: continue to have no stressors. Short: monitor dizzyness and contact doctor   long term: continue to be in good spirits Shrot: Talk to doctor about dizziness Long: Conitnue to cope with daughter     Interventions Encouraged to attend Cardiac Rehabilitation for the exercise;Stress management education;Relaxation education Stress management education;Relaxation education;Encouraged to attend Cardiac  Rehabilitation for the exercise Stress management education;Relaxation education;Encouraged to attend Cardiac Rehabilitation for the exercise     Continue Psychosocial Services  Follow up required by staff Follow up required by staff Follow up required by staff              Psychosocial Discharge (Final Psychosocial Re-Evaluation):  Psychosocial Re-Evaluation - 09/10/23 0941       Psychosocial  Re-Evaluation   Current issues with Current Stress Concerns    Comments Stirling has enjoyed rehab and nearing graduation. She was pleased with her progress and 28% improvement on her post walk.  She is still feeling dizzy but forgot to mention to doctor but will go by today.  She continues to have a lot of stress.  Her daughter is still her biggest stressor as she is her primary caregiver.  She never knows what kind of mood she is going to be in.    Expected Outcomes Shrot: Talk to doctor about dizziness Long: Conitnue to cope with daughter    Interventions Stress management education;Relaxation education;Encouraged to attend Cardiac Rehabilitation for the exercise    Continue Psychosocial Services  Follow up required by staff             Vocational Rehabilitation: Provide vocational rehab assistance to qualifying candidates.   Vocational Rehab Evaluation & Intervention:  Vocational Rehab - 07/09/23 1155       Initial Vocational Rehab Evaluation & Intervention   Assessment shows need for Vocational Rehabilitation No             Education: Education Goals: Education classes will be provided on a weekly basis, covering required topics. Participant will state understanding/return demonstration of topics presented.  Learning Barriers/Preferences:  Learning Barriers/Preferences - 07/09/23 1150       Learning Barriers/Preferences   Learning Barriers Sight   glasses   Learning Preferences None             Education Topics: Hypertension, Hypertension Reduction -Define heart  disease and high blood pressure. Discus how high blood pressure affects the body and ways to reduce high blood pressure.   Exercise and Your Heart -Discuss why it is important to exercise, the FITT principles of exercise, normal and abnormal responses to exercise, and how to exercise safely. Flowsheet Row CARDIAC REHAB PHASE II EXERCISE from 09/19/2023 in [place] CARDIAC REHABILITATION  Date 07/25/23  Educator [facility]  Instruction Review Code 1- Verbalizes Understanding       Angina -Discuss definition of angina, causes of angina, treatment of angina, and how to decrease risk of having angina. Flowsheet Row CARDIAC REHAB PHASE II EXERCISE from 09/19/2023 in [place] CARDIAC REHABILITATION  Date 07/18/23  Educator [facility]  Instruction Review Code 1- Verbalizes Understanding  [testing and procedures]       Cardiac Medications -Review what the following cardiac medications are used for, how they affect the body, and side effects that may occur when taking the medications.  Medications include Aspirin, Beta blockers, calcium channel blockers, ACE Inhibitors, angiotensin receptor blockers, diuretics, digoxin, and antihyperlipidemics. Flowsheet Row CARDIAC REHAB PHASE II EXERCISE from 09/19/2023 in [place] CARDIAC REHABILITATION  Date 09/05/23  Educator jh  Instruction Review Code 1- Verbalizes Understanding       Congestive Heart Failure -Discuss the definition of CHF, how to live with CHF, the signs and symptoms of CHF, and how keep track of weight and sodium intake. Flowsheet Row CARDIAC REHAB PHASE II EXERCISE from 09/19/2023 in [place] CARDIAC REHABILITATION  Date 08/29/23  Educator hb  Instruction Review Code 1- Verbalizes Understanding       Heart Disease and Intimacy -Discus the effect sexual activity has on the heart, how changes occur during intimacy as we age, and safety during sexual activity.   Smoking Cessation / COPD -Discuss different methods to quit  smoking, the health benefits of quitting smoking, and the definition of COPD.   Nutrition  I: Fats -Discuss the types of cholesterol, what cholesterol does to the heart, and how cholesterol levels can be controlled. Flowsheet Row CARDIAC REHAB PHASE II EXERCISE from 09/19/2023 in [place] CARDIAC REHABILITATION  Date 08/15/23  Educator [facility]  Instruction Review Code 1- Verbalizes Understanding       Nutrition II: Labels -Discuss the different components of food labels and how to read food label Flowsheet Row CARDIAC REHAB PHASE II EXERCISE from 09/19/2023 in [place] CARDIAC REHABILITATION  Date 08/15/23  Educator [facility]  Instruction Review Code 1- Verbalizes Understanding       Heart Parts/Heart Disease and PAD -Discuss the anatomy of the heart, the pathway of blood circulation through the heart, and these are affected by heart disease. Flowsheet Row CARDIAC REHAB PHASE II EXERCISE from 09/19/2023 in [place] CARDIAC REHABILITATION  Date 09/19/23  Educator hb  Instruction Review Code 1- Verbalizes Understanding       Stress I: Signs and Symptoms -Discuss the causes of stress, how stress may lead to anxiety and depression, and ways to limit stress. Flowsheet Row CARDIAC REHAB PHASE II EXERCISE from 09/19/2023 in [place] CARDIAC REHABILITATION  Date 08/22/23  Educator [facility]  Instruction Review Code 1- Verbalizes Understanding       Stress II: Relaxation -Discuss different types of relaxation techniques to limit stress. Flowsheet Row CARDIAC REHAB PHASE II EXERCISE from 09/19/2023 in [place] CARDIAC REHABILITATION  Date 08/22/23  Educator [facility]  Instruction Review Code 1- Verbalizes Understanding       Warning Signs of Stroke / TIA -Discuss definition of a stroke, what the signs and symptoms are of a stroke, and how to identify when someone is having stroke.   Knowledge Questionnaire Score:  Knowledge Questionnaire Score - 09/14/23 0934       Knowledge  Questionnaire Score   Pre Score 22/28    Post Score 21/24             Core Components/Risk Factors/Patient Goals at Admission:  Personal Goals and Risk Factors at Admission - 07/10/23 0948       Core Components/Risk Factors/Patient Goals on Admission    Weight Management Yes;Obesity;Weight Loss    Intervention Weight Management: Develop a combined nutrition and exercise program designed to reach desired caloric intake, while maintaining appropriate intake of nutrient and fiber, sodium and fats, and appropriate energy expenditure required for the weight goal.;Weight Management: Provide education and appropriate resources to help participant work on and attain dietary goals.;Weight Management/Obesity: Establish reasonable short term and long term weight goals.;Obesity: Provide education and appropriate resources to help participant work on and attain dietary goals.    Admit Weight 192 lb (87.1 kg)    Goal Weight: Short Term 187 lb (84.8 kg)    Goal Weight: Long Term 180 lb (81.6 kg)    Expected Outcomes Short Term: Continue to assess and modify interventions until short term weight is achieved;Long Term: Adherence to nutrition and physical activity/exercise program aimed toward attainment of established weight goal;Weight Loss: Understanding of general recommendations for a balanced deficit meal plan, which promotes 1-2 lb weight loss per week and includes a negative energy balance of [phone] kcal/d;Understanding recommendations for meals to include 15-35% energy as protein, 25-35% energy from fat, 35-60% energy from carbohydrates, less than 200mg  of dietary cholesterol, 20-35 gm of total fiber daily;Understanding of distribution of calorie intake throughout the day with the consumption of 4-5 meals/snacks    Diabetes Yes    Intervention Provide education about signs/symptoms and  action to take for hypo/hyperglycemia.;Provide education about proper nutrition, including hydration, and  aerobic/resistive exercise prescription along with prescribed medications to achieve blood glucose in normal ranges: Fasting glucose 65-99 mg/dL    Expected Outcomes Short Term: Participant verbalizes understanding of the signs/symptoms and immediate care of hyper/hypoglycemia, proper foot care and importance of medication, aerobic/resistive exercise and nutrition plan for blood glucose control.;Long Term: Attainment of HbA1C < 7%.    Hypertension Yes    Intervention Provide education on lifestyle modifcations including regular physical activity/exercise, weight management, moderate sodium restriction and increased consumption of fresh fruit, vegetables, and low fat dairy, alcohol moderation, and smoking cessation.;Monitor prescription use compliance.    Expected Outcomes Short Term: Continued assessment and intervention until BP is < 140/63mm HG in hypertensive participants. < 130/70mm HG in hypertensive participants with diabetes, heart failure or chronic kidney disease.;Long Term: Maintenance of blood pressure at goal levels.    Lipids Yes    Intervention Provide education and support for participant on nutrition & aerobic/resistive exercise along with prescribed medications to achieve LDL 70mg , HDL >40mg .    Expected Outcomes Short Term: Participant states understanding of desired cholesterol values and is compliant with medications prescribed. Participant is following exercise prescription and nutrition guidelines.;Long Term: Cholesterol controlled with medications as prescribed, with individualized exercise RX and with personalized nutrition plan. Value goals: LDL < 70mg , HDL > 40 mg.             Core Components/Risk Factors/Patient Goals Review:   Goals and Risk Factor Review     Row Name 07/23/23 0936 08/13/23 0946 09/10/23 0944         Core Components/Risk Factors/Patient Goals Review   Personal Goals Review Weight Management/Obesity;Diabetes;Improve shortness of breath with  ADL's;Stress;Hypertension Weight Management/Obesity;Diabetes;Improve shortness of breath with ADL's;Stress;Hypertension Weight Management/Obesity;Diabetes;Improve shortness of breath with ADL's;Hypertension     Review Mimi is montioring her weight and has noticed that since they cut her lasix she has had fluxuating weight some days. She also noticed increase of SOB when exercising. She is going to start closely monitor her wight and SOB in the next couple weeks to see if there is a neigative effect on cutting her lasix. She also was cut down on her metoperlol and has noticed an increase in her BP. She does not take her BP at home and we talked about trying to take it at home to montior for her doctors to see if the cut back on medications is negativly effecting her BP. She does check her sugar regular and said that it can be all over the place. She said that it can be because of her loves for sweets and also stress. She is going to try to cut back on her nighttime sweets. Shanvi continues to monitor her weight, she still continues to have fluxuating weight due to cutting her lasix. She has started to feel dizzy and not sure what is causing it. She does not take her BP at home. She continues to check her surgar at [facility] and stated that it can be all over the place, she des like sweets so that will paly a roll. Sutter is doing well in rehab. She improved her walk test!  Her weight was up to 195 today but she is working on her diet and hoping to get it back down again.  She is doing well on her BP.  She is noting still feeling dizzy and forgot to mention to doctor but plans to stop by office today.  She had an echo last week and all looked good.  She did get her meds reduced some and hoping that will help with her dizzy spells. Her sugars are doing well and are in good control.     Expected Outcomes Short term: montior her weight and blood pressure.   long term: continue to monitor weight and BP and exercise to help with  stress Short term: montior her weight and blood pressure.   long term: continue to monitor weight and BP and exercise to help with stress Short: Continue to keep eye on dizziness Long; COnitnue to work on weight loss              Core Components/Risk Factors/Patient Goals at Discharge (Final Review):   Goals and Risk Factor Review - 09/10/23 0944       Core Components/Risk Factors/Patient Goals Review   Personal Goals Review Weight Management/Obesity;Diabetes;Improve shortness of breath with ADL's;Hypertension    Review Renice is doing well in rehab. She improved her walk test!  Her weight was up to 195 today but she is working on her diet and hoping to get it back down again.  She is doing well on her BP.  She is noting still feeling dizzy and forgot to mention to doctor but plans to stop by office today. She had an echo last week and all looked good.  She did get her meds reduced some and hoping that will help with her dizzy spells. Her sugars are doing well and are in good control.    Expected Outcomes Short: Continue to keep eye on dizziness Long; COnitnue to work on weight loss             ITP Comments:  ITP Comments     Row Name 07/09/23 1203 07/10/23 0933 07/11/23 0755 07/23/23 0956 08/08/23 0650   ITP Comments Completed virtual orientation today.  EP evaluation is scheduled for 07/10/23 at 830.  Documentation for diagnosis can be found in [facility] encounter 05/14/23 and OV 06/04/23. Patient attend orientation today.  Patient is attendingCardiac Rehabilitation Program.  Documentation for diagnosis can be found in [facility] 05/13/53.  Reviewed medical chart, RPE/RPD, gym safety, and program guidelines.  Patient was fitted to equipment they will be using during rehab.  Patient is scheduled to start exercise on 07/13/23.   Initial ITP created and sent for review and signature by Dr. Dina Rich, Medical Director for Cardiac Rehabilitation Program. 30 day review completed. ITP sent to Dr. Dina Rich, Medical Director of Cardiac Rehab. Continue with ITP unless changes are made by physician.  Pt just oriented 07/11/23. Went over home exercise today. Katilynn said that he dad has equipement at his home and she will starting going over there to workout with him. She is also going to start walking 30 day review completed. ITP sent to Dr. Dina Rich, Medical Director of Cardiac Rehab. Continue with ITP unless changes are made by physician.    Row Name 09/05/23 0651 09/19/23 0934         ITP Comments 30 day review completed. ITP sent to Dr. Dina Rich, Medical Director of Cardiac Rehab. Continue with ITP unless changes are made by physician. Solangie graduated today from  rehab with 36 sessions completed.  Details of the patient's exercise prescription and what She needs to do in order to continue the prescription and progress were discussed with patient.  Patient was given a copy of prescription and goals.  Patient verbalized understanding. Milana plans to  continue to exercise by walking at home.               Comments: Discharge ITP

## 2023-09-19 NOTE — Progress Notes (Signed)
Daily Session Note  Patient Details  Name: Stacy Frost MRN: [id number] Date of Birth: May 22, 1961 Referring Provider:   Flowsheet Row CARDIAC REHAB PHASE II ORIENTATION from 07/10/2023 in [facility] CARDIAC REHABILITATION  Referring Provider Dietrich Pates MD       Encounter Date: 09/19/2023  Check In:  Session Check In - 09/19/23 0915       Check-In   Supervising physician immediately available to respond to emergencies See telemetry face sheet for immediately available MD    Location [facility]    Staff Present Ross Ludwig, BS, Exercise Physiologist;Jessica Juanetta Gosling, MA, RCEP, CCRP, CCET;Other    Virtual Visit No    Medication changes reported     No    Fall or balance concerns reported    No    Tobacco Cessation No Change    Warm-up and Cool-down Performed on first and last piece of equipment    Resistance Training Performed Yes    VAD Patient? No    PAD/SET Patient? No      Pain Assessment   Currently in Pain? No/denies    Pain Score 0-No pain    Multiple Pain Sites No             Capillary Blood Glucose: Results for orders placed or performed during the hospital encounter of 09/19/23 (from the past 24 hour(s))  Glucose, capillary     Status: Abnormal   Collection Time: 09/19/23  9:26 AM  Result Value Ref Range   Glucose-Capillary 300 (H) 70 - 99 mg/dL      Social History   Tobacco Use  Smoking Status Never  Smokeless Tobacco Never    Goals Met:  Independence with exercise equipment  Goals Unmet:  Not Applicable  Comments:  Stacy Frost graduated today from  rehab with 36 sessions completed.  Details of the patient's exercise prescription and what She needs to do in order to continue the prescription and progress were discussed with patient.  Patient was given a copy of prescription and goals.  Patient verbalized understanding. Stacy Frost plans to continue to exercise by walking and exercising at home.

## 2023-09-21 ENCOUNTER — Encounter (HOSPITAL_COMMUNITY): Payer: 59

## 2023-09-24 ENCOUNTER — Encounter (HOSPITAL_COMMUNITY): Payer: 59

## 2023-09-25 ENCOUNTER — Ambulatory Visit: Payer: 59 | Attending: Nurse Practitioner | Admitting: Nurse Practitioner

## 2023-09-25 ENCOUNTER — Encounter: Payer: Self-pay | Admitting: Nurse Practitioner

## 2023-09-25 VITALS — BP 138/82 | HR 87 | Ht 65.0 in | Wt 192.8 lb

## 2023-09-25 DIAGNOSIS — R29898 Other symptoms and signs involving the musculoskeletal system: Secondary | ICD-10-CM

## 2023-09-25 DIAGNOSIS — I10 Essential (primary) hypertension: Secondary | ICD-10-CM | POA: Diagnosis not present

## 2023-09-25 DIAGNOSIS — Z952 Presence of prosthetic heart valve: Secondary | ICD-10-CM

## 2023-09-25 DIAGNOSIS — I951 Orthostatic hypotension: Secondary | ICD-10-CM

## 2023-09-25 DIAGNOSIS — T148XXA Other injury of unspecified body region, initial encounter: Secondary | ICD-10-CM

## 2023-09-25 DIAGNOSIS — Z01810 Encounter for preprocedural cardiovascular examination: Secondary | ICD-10-CM

## 2023-09-25 DIAGNOSIS — T148XXD Other injury of unspecified body region, subsequent encounter: Secondary | ICD-10-CM

## 2023-09-25 DIAGNOSIS — I5032 Chronic diastolic (congestive) heart failure: Secondary | ICD-10-CM

## 2023-09-25 DIAGNOSIS — R296 Repeated falls: Secondary | ICD-10-CM

## 2023-09-25 DIAGNOSIS — E785 Hyperlipidemia, unspecified: Secondary | ICD-10-CM

## 2023-09-25 MED ORDER — FUROSEMIDE 40 MG PO TABS: 20.0000 mg | ORAL_TABLET | Freq: Every day | ORAL | 5 refills | Status: DC | PRN

## 2023-09-25 MED ORDER — METOPROLOL SUCCINATE ER 25 MG PO TB24: 25.0000 mg | ORAL_TABLET | Freq: Every day | ORAL | 1 refills | Status: DC

## 2023-09-25 NOTE — Progress Notes (Signed)
Cardiology Office Note:  .   Date:  09/25/2023  ID:  Stacy Frost, DOB 11-27-1960, MRN [id number] PCP: Kirstie Peri, MD  [facility] Providers Cardiologist:  Dietrich Pates, MD    History of Present Illness: .   Stacy Frost is a 62 y.o. female with a PMH of severe aortic stenosis due to bicuspid aortic valve, s/p AVR, type 2 diabetes, dyslipidemia, hypertension, obesity, and migraine headaches, who presents today for post AVR follow-up.   Was initially referred to Dr. Cliffton Asters for evaluation for severe aortic stenosis.  She was admitted for suffering syncopal episodes at home while using bathroom.  Required urgent admission and surgical intervention.  Underwent AVR with 25 mm [employer] Inspiris Bovine pericardial tissue valve on 05/14/2023.  She was diuresed routinely postprocedure due to expected volume excess.  She also had expected acute blood loss anemia, required transfusion with 1 unit PRBC.  07/17/2023 - Today she presents for AVR follow-up. She states she is doing well. Shows me her log from home.  Overall her blood pressure and vital signs are stable.  Weight at one point does show some fluctuation and increase and was experiencing SHOB during this time.  She called into the office and was instructed to take an extra tablet of Lasix and then shows that her weight went back to her baseline.  Ever since then, her weight has been stable. Denies any chest pain, recent shortness of breath, palpitations, syncope, presyncope, dizziness, orthopnea, PND, recent swelling or significant weight changes, acute bleeding, or claudication. Does report nausea and loss of appetite.   09/25/2023 - Today she presents for follow-up.  Her daughter-in-law is present via FaceTime (with patient's permission and consent), who has several concerns regarding her mother-in-law.  Her daughter-in-law is an [employer] fundraiser.  Daughter-in-law mentions frequent falls and weakness that has gotten worse recently.  Since I last saw  patient in office, there has been estimated 10 times patient has fallen.  She is scheduled to see her PCP soon regarding several these issues.  Daughter-in-law wonders if her BP is dropping that is contributing to her symptoms.  Says she has a bruise on her right lateral rib cage from a past fall.  She also has multiple infected teeth and currently just finished a prescription of amoxicillin for this.  Says she sent preoperative clearance to Dr. Charlott Rakes office, and has not heard back. Pt confirms she is taking Lasix daily instead of PRN. Denies any chest pain, shortness of breath, palpitations, syncope, presyncope, orthopnea, PND, recent swelling or significant weight changes, acute bleeding, or claudication.   ROS: Negative. See HPI.   Studies Reviewed: .    Limited Echo 08/2023:  1. Left ventricular ejection fraction, by estimation, is 65 to 70%. The  left ventricle has normal function. The left ventricle has no regional  wall motion abnormalities. There is mild left ventricular hypertrophy.   2. Right ventricular systolic function is normal. The right ventricular  size is normal.   3. 25mm Inspiris Valve is in the AV position with normal function. . The  aortic valve has been repaired/replaced. Aortic valve regurgitation is not  visualized. No aortic stenosis is present.   4. IVC is small suggesting low RA pressure and hypovolemia.   5. Limited echo evaluate aortic valve function   Echo 05/2023:  1. Left ventricular ejection fraction, by estimation, is 60 to 65%. The  left ventricle has normal function. The left ventricle has no regional  wall motion abnormalities. There is  moderate left ventricular hypertrophy. Left ventricular diastolic parameters are consistent with Grade I diastolic dysfunction (impaired relaxation).   2. Right ventricular systolic function is normal. The right ventricular  size is normal. There is normal pulmonary artery systolic pressure.   3. The mitral valve is  normal in structure. No evidence of mitral valve  regurgitation. No evidence of mitral stenosis.   4. The tricuspid valve is abnormal.   5. 25mm Inspiris Valve is in the AV position. . The aortic valve has been repaired/replaced. Aortic valve regurgitation is not visualized. No aortic stenosis is present.   6. The inferior vena cava is normal in size with greater than 50%  respiratory variability, suggesting right atrial pressure of 3 mmHg.  TEE 04/2023:  Complications: No known complications during this procedure.  POST-OP IMPRESSIONS  _ Left Ventricle: The left ventricle is unchanged from pre-bypass.  _ Right Ventricle: The right ventricle appears unchanged from pre-bypass.  _ Aortic Valve: A bioprosthetic bioprosthetic valve was placed, leaflets  are  freely mobile and leaflets thin. Manufactured by; inspiris Size; 25mm.  There is  no regurgitation or stenosis of the new aortic valve.  _ Mitral Valve: The mitral valve appears unchanged from pre-bypass.   PRE-OP FINDINGS   Left Ventricle: The left ventricle has normal systolic function, with an  ejection fraction of 55-60%. The cavity size was normal. There is mildly  increased left ventricular wall thickness. There is mild concentric left  ventricular hypertrophy.  R/LHC 04/2023:  Normal coronary anatomy Normal LV filling pressure. PCWP 19/22 mean 13 mm Hg Normal right heart pressures. PAP 31/13 with mean 22 mm Hg Normal cardiac output 5.54 L/min, index 2.90   Plan: valve team assessment for AVR     Physical Exam:   VS:  BP 138/82   Pulse 87   Ht 5\' 5"  (1.651 m)   Wt 192 lb 12.8 oz (87.5 kg)   SpO2 99%   BMI 32.08 kg/m    Wt Readings from Last 3 Encounters:  09/25/23 192 lb 12.8 oz (87.5 kg)  09/10/23 195 lb 6.4 oz (88.6 kg)  07/17/23 189 lb 12.8 oz (86.1 kg)    Orthostatic blood pressures: Lying-144/86, 80 bpm Sitting-123/79, 80 bpm Standing-110/77, 87 bpm Standing for 3 minutes-131/86, 89 bpm   GEN: Obese, 62  y.o. female in no acute distress NECK: No JVD; No carotid bruits CARDIAC: S1/S2, RRR, no murmurs, rubs, gallops RESPIRATORY:  Clear to auscultation without rales, wheezing or rhonchi  ABDOMEN: Soft, non-tender, non-distended EXTREMITIES:  No edema; No deformity   ASSESSMENT AND PLAN: .    AS, s/p AVR Recent limited echo reviewed and revealed normal EF, normal function of AV prosthesis.  Patient has currently finished prescription of amoxicillin.  She has multiple infected teeth that need to be pulled-see preoperative clearance below.  Will route note to Dr. Tenny Craw for further recs. SBE prophylaxis discussed and she verbalized understanding.  She is able to tolerate amoxicillin.  Previously wrote Rx for Amoxicillin 2,000 mg once per protocol prior to dental procedure.  Reducing metoprolol succinate to 25 mg daily.  Continue current medication regimen.  She completed cardiac rehab last Wednesday.  Heart healthy diet and regular cardiovascular exercise encouraged. Care and ED precautions discussed.     2.  HFpEF Stage C, NYHA class I-II symptoms.  EF recently was found to be normal. Euvolemic and well compensated on exam.  Patient confirms she is taking Lasix daily, instructed her to take Lasix 20 mg daily  as needed for swelling/weight gain.  Will also reduce metoprolol succinate to 25 mg daily to improve her BP.  No other medication changes at this time.  Low sodium diet, fluid restriction <2L, and daily weights encouraged. Educated to contact our office for weight gain of 2 lbs overnight or 5 lbs in one week.  GDMT limited at this time due to her orthostatic hypotension-see readings above.  3.  Orthostatic hypotension, HTN  Orthostatics were positive today for orthostatic hypotension. Pt was also symptomatic today upon standing. Instructed her to take Lasix 20 mg daily as needed for swelling/weight gain.  Will also reduce metoprolol succinate to 25 mg daily to improve her BP.  Discussed to monitor BP at  home at least 2 hours after medications and sitting for 5-10 minutes.  No other medication changes at this time.  Heart healthy diet encouraged. Discussed conservative measures, will write Rx for compression stockings and care and ED precautions discussed.   4. Dyslipidemia She has pending future labs with endocrinology.  Continue atorvastatin. Heart healthy diet encouraged.   5. Weakness, multiple falls, bruising Most likely d/t orthostatic hypotension, however could possibly be also related to other etiology. Will obtain ABI's for any evaluation for PAD for weakness in both legs with walking. Does have bruising that appears to be healing along right lateral rib cage from past fall. Will obtain 2 view CXR to r/o any fracture. Recommended to f/u with PCP, may benefit from PT evaluation, pt defers labs to PCP. Care and ED precautions discussed.   6. Pre-operative cardiovascular risk assessment Ms. Longmore's perioperative risk of a major cardiac event is 0.9% according to the Revised Cardiac Risk Index (RCRI).  Therefore, she is at low risk for perioperative complications.   Her functional capacity is poor at 3.97 METs according to the Duke Activity Status Index (DASI). Recommendations: According to ACC/AHA guidelines, no further cardiovascular testing needed.  The patient may proceed to surgery at acceptable risk.   Antiplatelet and/or Anticoagulation Recommendations: Will route note to attending cardiologist for further recs. Patient will require SBE prophylaxis prior to procedure.   Dispo: Follow-up with me or APP in 4 weeks or sooner if anything changes.  Signed, Sharlene Dory, NP

## 2023-09-25 NOTE — Patient Instructions (Addendum)
Medication Instructions:  Your physician has recommended you make the following change in your medication:  Please decrease Metoprolol to 25 Mg daily  Please reduce Lasix to 20 Mg daily as needed for swelling  Labwork: None   Testing/Procedures: Your physician has requested that you have an ankle brachial index (ABI). During this test an ultrasound and blood pressure cuff are used to evaluate the arteries that supply the arms and legs with blood. Allow thirty minutes for this exam. There are no restrictions or special instructions.  Please note: We ask at that you not bring children with you during ultrasound (echo/ vascular) testing. Due to room size and safety concerns, children are not allowed in the ultrasound rooms during exams. Our front office staff cannot provide observation of children in our lobby area while testing is being conducted. An adult accompanying a patient to their appointment will only be allowed in the ultrasound room at the discretion of the ultrasound technician under special circumstances. We apologize for any inconvenience. A chest x-ray takes a picture of the organs and structures inside the chest, including the heart, lungs, and blood vessels. This test can show several things, including, whether the heart is enlarges; whether fluid is building up in the lungs; and whether pacemaker / defibrillator leads are still in place.  Follow-Up: Your physician recommends that you schedule a follow-up appointment in: 4 weeks   Any Other Special Instructions Will Be Listed Below (If Applicable).  If you need a refill on your cardiac medications before your next appointment, please call your pharmacy.

## 2023-09-26 ENCOUNTER — Encounter (HOSPITAL_COMMUNITY): Payer: 59

## 2023-09-26 NOTE — Progress Notes (Signed)
Pt needs to stay on amoxicllin if teeth infected Needs to have them pulled      DO not want valve to get infected Forward to dentist She should be able to take aspirin and have teeth pulled

## 2023-10-01 ENCOUNTER — Encounter (HOSPITAL_COMMUNITY): Payer: 59

## 2023-10-03 ENCOUNTER — Encounter (HOSPITAL_COMMUNITY): Payer: 59

## 2023-10-03 ENCOUNTER — Ambulatory Visit (INDEPENDENT_AMBULATORY_CARE_PROVIDER_SITE_OTHER): Payer: 59 | Admitting: Nurse Practitioner

## 2023-10-03 ENCOUNTER — Encounter: Payer: Self-pay | Admitting: Nurse Practitioner

## 2023-10-03 VITALS — BP 138/82 | HR 81 | Ht 65.0 in | Wt 195.0 lb

## 2023-10-03 DIAGNOSIS — Z7985 Long-term (current) use of injectable non-insulin antidiabetic drugs: Secondary | ICD-10-CM

## 2023-10-03 DIAGNOSIS — Z794 Long term (current) use of insulin: Secondary | ICD-10-CM

## 2023-10-03 DIAGNOSIS — Z7984 Long term (current) use of oral hypoglycemic drugs: Secondary | ICD-10-CM

## 2023-10-03 DIAGNOSIS — E1165 Type 2 diabetes mellitus with hyperglycemia: Secondary | ICD-10-CM | POA: Diagnosis not present

## 2023-10-03 LAB — POCT GLYCOSYLATED HEMOGLOBIN (HGB A1C): Hemoglobin A1C: 9.7 % — AB (ref 4.0–5.6)

## 2023-10-03 MED ORDER — PEN NEEDLES 31G X 8 MM MISC: 3 refills | Status: DC

## 2023-10-03 MED ORDER — INSULIN GLARGINE-YFGN 100 UNIT/ML ~~LOC~~ SOPN: 20.0000 [IU] | PEN_INJECTOR | Freq: Every day | SUBCUTANEOUS | 3 refills | Status: DC

## 2023-10-03 NOTE — Progress Notes (Signed)
Endocrinology Follow Up Note       10/03/2023, 11:20 AM   Subjective:    Patient ID: Stacy Frost, female    DOB: 05-26-1961.  Stacy Frost is being seen in follow up after being seen in consultation for management of currently uncontrolled symptomatic diabetes requested by  Kirstie Peri, MD.   Past Medical History:  Diagnosis Date   Anxiety    Aortic stenosis    Mild to moderate   Bicuspid aortic valve    Essential hypertension, benign    GERD (gastroesophageal reflux disease)    History of cardiac catheterization    Normal coronaries 2008   Hyperlipidemia    Migraine headache    MVP (mitral valve prolapse)    Obesity    Pain in joint, ankle and foot    Chronic   Palpitations    Documented PACs by previous monitoring   Restless leg syndrome    Skin tag    Type 2 diabetes mellitus (HCC)    Unilateral primary osteoarthritis, right knee 09/06/2016    Past Surgical History:  Procedure Laterality Date   ACHILLES TENDON REPAIR  2003   Left   AORTIC VALVE REPLACEMENT N/A 05/14/2023   Procedure: AORTIC VALVE REPLACEMENT (AVR);  Surgeon: Corliss Skains, MD;  Location: [facility] OR;  Service: Open Heart Surgery;  Laterality: N/A;   CARDIAC CATHETERIZATION  2008   Normal coronary arteries; normal LV systolic function; mild dilation of aortic root; mild dilation of proximal ascending aorta; likely bicuspid aortic valve   CHOLECYSTECTOMY  2001   "Poor EF at 17%"   COLONOSCOPY N/A 07/17/2014   Procedure: COLONOSCOPY;  Surgeon: Malissa Hippo, MD;  Location: [facility] SUITE;  Service: Endoscopy;  Laterality: N/A;  105   ESOPHAGEAL DILATION N/A 06/11/2015   Procedure: ESOPHAGEAL DILATION;  Surgeon: Malissa Hippo, MD;  Location: [facility] ORS;  Service: Endoscopy;  Laterality: N/AElease Frost 54/56/58   ESOPHAGOGASTRODUODENOSCOPY N/A 07/17/2014   Procedure: ESOPHAGOGASTRODUODENOSCOPY (EGD);  Surgeon: Malissa Hippo, MD;   Location: [facility];  Service: Endoscopy;  Laterality: N/A;   ESOPHAGOGASTRODUODENOSCOPY (EGD) WITH PROPOFOL N/A 06/11/2015   Procedure: ESOPHAGOGASTRODUODENOSCOPY (EGD) WITH PROPOFOL;  Surgeon: Malissa Hippo, MD;  Location: [facility] ORS;  Service: Endoscopy;  Laterality: N/A;  730   HERNIA REPAIR     umbilical    KNEE ARTHROSCOPY WITH MEDIAL MENISECTOMY Right 07/02/2018   Procedure: RIGHT KNEE ARTHROSCOPY, DEBRIDEMENT, MEDIAL MENISECTOMY;  Surgeon: [name], MD;  Location: [facility] OR;  Service: Orthopedics;  Laterality: Right;   KNEE SURGERY     MALONEY DILATION N/A 07/17/2014   Procedure: Stacy Frost DILATION;  Surgeon: Malissa Hippo, MD;  Location: [facility];  Service: Endoscopy;  Laterality: N/A;   RIGHT/LEFT HEART CATH AND CORONARY ANGIOGRAPHY N/A 05/02/2023   Procedure: RIGHT/LEFT HEART CATH AND CORONARY ANGIOGRAPHY;  Surgeon: [place], Peter M, MD;  Location: [facility] INVASIVE CV LAB;  Service: Cardiovascular;  Laterality: N/A;   TEE WITHOUT CARDIOVERSION N/A 05/14/2023   Procedure: TRANSESOPHAGEAL ECHOCARDIOGRAM;  Surgeon: Corliss Skains, MD;  Location: [facility] OR;  Service: Open Heart Surgery;  Laterality: N/A;   UMBILICAL HERNIA REPAIR  2003    Social  History   Socioeconomic History   Marital status: Married    Spouse name: Harvie Heck   Number of children: 2   Years of education: Not on file   Highest education level: Some college, no degree  Occupational History   Occupation: Arts development officer  Tobacco Use   Smoking status: Never   Smokeless tobacco: Never  Vaping Use   Vaping status: Never Used  Substance and Sexual Activity   Alcohol use: No    Alcohol/week: 0.0 standard drinks of alcohol   Drug use: No   Sexual activity: Not on file  Other Topics Concern   Not on file  Social History Narrative   Married, lives with husband in a one level home. They recently got a puppy. She avoids caffeine, does not exercise.    Social Determinants of Health   Financial Resource Strain: Not on  file  Food Insecurity: No Food Insecurity (05/11/2023)   Hunger Vital Sign    Worried About Running Out of Food in the Last Year: Never true    Ran Out of Food in the Last Year: Never true  Transportation Needs: No Transportation Needs (05/11/2023)   PRAPARE - Administrator, Civil Service (Medical): No    Lack of Transportation (Non-Medical): No  Physical Activity: Not on file  Stress: No Stress Concern Present (11/27/2019)   Received from [facility], [facility] of Occupational Health - Occupational Stress Questionnaire    Feeling of Stress : Not at all  Social Connections: Not on file    Family History  Problem Relation Age of Onset   Depression Mother    Cancer Mother        Lung mets (unsure as to what kind)   Diabetes Mother    Hypertension Father    Depression Father    Heart attack Father    Depression Daughter    Anxiety disorder Daughter    Stroke Daughter    Transient ischemic attack Brother    Emphysema Maternal Grandfather    Brain cancer Paternal Grandmother    Heart attack Paternal Grandfather     Outpatient Encounter Medications as of 10/03/2023  Medication Sig   aspirin EC 325 MG tablet Take 1 tablet (325 mg total) by mouth daily.   atorvastatin (LIPITOR) 80 MG tablet Take 1 tablet (80 mg total) by mouth daily.   celecoxib (CELEBREX) 200 MG capsule Take 200 mg by mouth 2 (two) times daily.   citalopram (CELEXA) 40 MG tablet Take 40 mg by mouth daily.   clonazePAM (KLONOPIN) 0.5 MG tablet Take 0.5 mg by mouth 2 (two) times daily as needed.   Compression Bandages KIT 1 each by Does not apply route daily as needed.   Continuous Blood Gluc Receiver (DEXCOM G7 RECEIVER) DEVI USE 1 receiver continuously   Continuous Blood Gluc Sensor (DEXCOM G7 SENSOR) MISC APPLY 1 SENSOR EVERY 10 DAYS   cyclobenzaprine (FLEXERIL) 10 MG tablet Take 10 mg by mouth 3 (three) times daily.   ferrous sulfate 325 (65 FE) MG tablet Take 1 tablet  (325 mg total) by mouth daily with breakfast.   furosemide (LASIX) 40 MG tablet Take 0.5 tablets (20 mg total) by mouth daily as needed (for swelling and weight gain).   insulin glargine-yfgn (SEMGLEE, YFGN,) 100 UNIT/ML Pen Inject 20 Units into the skin at bedtime.   Insulin Pen Needle (PEN NEEDLES) 31G X 8 MM MISC Use to inject insulin once daily  metFORMIN (GLUCOPHAGE-XR) 500 MG 24 hr tablet Take 2 tablets (1,000 mg total) by mouth daily with breakfast.   metoprolol succinate (TOPROL-XL) 25 MG 24 hr tablet Take 1 tablet (25 mg total) by mouth daily. Take with or immediately following a meal.   potassium chloride SA (KLOR-CON M) 20 MEQ tablet Take 1 tablet (20 mEq total) by mouth daily as needed.   pramipexole (MIRAPEX) 0.5 MG tablet Take 0.5 mg by mouth at bedtime.   promethazine (PHENERGAN) 25 MG tablet Take 25 mg by mouth 2 (two) times daily as needed.   tirzepatide (MOUNJARO) 7.5 MG/0.5ML Pen Inject 7.5 mg into the skin once a week.   traZODone (DESYREL) 100 MG tablet Take 100 mg by mouth at bedtime.   No facility-administered encounter medications on file as of 10/03/2023.    ALLERGIES: Allergies  Allergen Reactions   Amlodipine Besylate Shortness Of Breath   Morphine And Codeine Shortness Of Breath and Other (See Comments)    Chest pain   Amitriptyline Other (See Comments)    Unknown reaction   Butorphanol Tartrate Other (See Comments)    REACTION: ED visit and got for migraine - not sure of response   Compazine [Prochlorperazine Edisylate] Other (See Comments)    Altered mental status   Rocephin [Ceftriaxone Sodium In Dextrose]     Given at GYN for infection - nauseated, dizziness (w/in last 7-8 years)   Sumatriptan Other (See Comments)    REACTION: HTN    VACCINATION STATUS: Immunization History  Administered Date(s) Administered   H1N1 08/20/2008   Influenza Whole 08/20/2008   Pneumococcal Polysaccharide-23 08/20/2008   Td 05/30/1996    Diabetes She presents for  her follow-up diabetic visit. She has type 2 diabetes mellitus. Onset time: Diagnosed at approx age of 25. Her disease course has been worsening. There are no hypoglycemic associated symptoms. Pertinent negatives for diabetes include no weight loss. There are no hypoglycemic complications. Symptoms are stable. There are no diabetic complications. Risk factors for coronary artery disease include diabetes mellitus, dyslipidemia, family history, obesity, hypertension, sedentary lifestyle and post-menopausal. Current diabetic treatment includes oral agent (monotherapy) (and Mounjaro). She is compliant with treatment most of the time. Her weight is fluctuating minimally. She is following a generally healthy diet. When asked about meal planning, she reported none. She has not had a previous visit with a dietitian. She rarely participates in exercise. Her home blood glucose trend is increasing steadily. Her overall blood glucose range is >200 mg/dl. (She presents today with her CGM showing gross hyperglycemia overall.  Her POCT A1C today is 9.7%, increasing from last visit of 7.4%.  Analysis of her CGM shows TIR 1%, TAR 99%, TBR 0% with a GMI of 10.3%.  She has recently developed stiffness in her legs, is going for vascular testing to check blood flow, may end up having hip or back surgery in the future.  She has not been sick since last visit, no steroids.) An ACE inhibitor/angiotensin II receptor blocker is being taken. She does not see a podiatrist.Eye exam is current.    Review of systems  Constitutional: + stable body weight,  current Body mass index is 32.45 kg/m. , no fatigue, no subjective hyperthermia, no subjective hypothermia Eyes: no blurry vision, no xerophthalmia ENT: no sore throat, no nodules palpated in throat, no dysphagia/odynophagia, no hoarseness Cardiovascular: no chest pain, no shortness of breath, no palpitations, no leg swelling Respiratory: no cough, no shortness of  breath Gastrointestinal: no nausea/vomiting/diarrhea Musculoskeletal: no muscle/joint aches  Skin: no rashes, no hyperemia Neurological: no tremors, no numbness, no tingling, no dizziness Psychiatric: no depression, no anxiety  Objective:     BP 138/82 (BP Location: Right Arm, Patient Position: Sitting, Cuff Size: Large)   Pulse 81   Ht 5\' 5"  (1.651 m)   Wt 195 lb (88.5 kg)   BMI 32.45 kg/m   Wt Readings from Last 3 Encounters:  10/03/23 195 lb (88.5 kg)  09/25/23 192 lb 12.8 oz (87.5 kg)  09/10/23 195 lb 6.4 oz (88.6 kg)     BP Readings from Last 3 Encounters:  10/03/23 138/82  09/25/23 138/82  07/17/23 124/70      Physical Exam- Limited  Constitutional:  Body mass index is 32.45 kg/m. , not in acute distress, normal state of mind Eyes:  EOMI, no exophthalmos Musculoskeletal: no gross deformities, strength intact in all four extremities, no gross restriction of joint movements Skin:  no rashes, no hyperemia Neurological: no tremor with outstretched hands   Diabetic Foot Exam - Simple   No data filed     CMP ( most recent) CMP     Component Value Date/Time   NA 133 (L) 05/21/2023 0203   K 4.8 05/21/2023 0203   CL 95 (L) 05/21/2023 0203   CO2 27 05/21/2023 0203   GLUCOSE 159 (H) 05/21/2023 0203   BUN 9 05/21/2023 0203   CREATININE 0.80 05/21/2023 0203   CALCIUM 9.1 05/21/2023 0203   PROT 8.2 (H) 06/16/2015 1535   ALBUMIN 4.8 06/16/2015 1535   AST 30 06/16/2015 1535   ALT 39 (H) 06/16/2015 1535   ALKPHOS 91 06/16/2015 1535   BILITOT 1.3 (H) 06/16/2015 1535   GFRNONAA >60 05/21/2023 0203   GFRAA >60 06/27/2018 0929     Diabetic Labs (most recent): Lab Results  Component Value Date   HGBA1C 9.7 (A) 10/03/2023   HGBA1C 7.4 (H) 05/14/2023   HGBA1C 7.4 (H) 05/13/2023   MICROALBUR 30 mg/L 08/29/2022     Lipid Panel ( most recent) Lipid Panel     Component Value Date/Time   CHOL 165 06/29/2008 0842   TRIG 206 06/29/2008 0842   HDL 42 06/29/2008  0842   LDLCALC 82 06/29/2008 0842      No results found for: "TSH", "FREET4"         Assessment & Plan:   1) Type 2 diabetes mellitus with hyperglycemia, with long-term current use of insulin (HCC)  She presents today with her CGM showing gross hyperglycemia overall.  Her POCT A1C today is 9.7%, increasing from last visit of 7.4%.  Analysis of her CGM shows TIR 1%, TAR 99%, TBR 0% with a GMI of 10.3%.  She has recently developed stiffness in her legs, is going for vascular testing to check blood flow, may end up having hip or back surgery in the future.  She has not been sick since last visit, no steroids.   - Stacy Frost has currently uncontrolled symptomatic type 2 DM since 62 years of age.   -Recent labs reviewed.  - I had a long discussion with her about the progressive nature of diabetes and the pathology behind its complications. -her diabetes is not currently complicated but she remains at a high risk for more acute and chronic complications which include CAD, CVA, CKD, retinopathy, and neuropathy. These are all discussed in detail with her.  The following Lifestyle Medicine recommendations according to American College of Lifestyle Medicine [facility]) were discussed and offered to patient and she agrees to  start the journey:  A. Whole Foods, Plant-based plate comprising of fruits and vegetables, plant-based proteins, whole-grain carbohydrates was discussed in detail with the patient.   A list for source of those nutrients were also provided to the patient.  Patient will use only water or unsweetened tea for hydration. B.  The need to stay away from risky substances including alcohol, smoking; obtaining 7 to 9 hours of restorative sleep, at least 150 minutes of moderate intensity exercise weekly, the importance of healthy social connections,  and stress reduction techniques were discussed. C.  A full color page of  Calorie density of various food groups per pound showing examples of each  food groups was provided to the patient.  - Nutritional counseling repeated at each appointment due to patients tendency to fall back in to old habits.  - The patient admits there is a room for improvement in their diet and drink choices. -  Suggestion is made for the patient to avoid simple carbohydrates from their diet including Cakes, Sweet Desserts / Pastries, Ice Cream, Soda (diet and regular), Sweet Tea, Candies, Chips, Cookies, Sweet Pastries, Store Bought Juices, Alcohol in Excess of 1-2 drinks a day, Artificial Sweeteners, Coffee Creamer, and "Sugar-free" Products. This will help patient to have stable blood glucose profile and potentially avoid unintended weight gain.   - I encouraged the patient to switch to unprocessed or minimally processed complex starch and increased protein intake (animal or plant source), fruits, and vegetables.   - Patient is advised to stick to a routine mealtimes to eat 3 meals a day and avoid unnecessary snacks (to snack only to correct hypoglycemia).  - I have approached her with the following individualized plan to manage her diabetes and patient agrees:   -She is advised to continue Mounjaro 7.5 mg SQ weekly to and continue her Metformin 1000 mg ER PO daily with breakfast.  I did restart her Semglee at 20 units SQ nightly to help control hyperglycemia.  -she is encouraged to continue monitoring glucose 4 times daily (using her CGM), before meals and before bed, and to call the clinic if she has readings less than 70 or above 200 for 3 tests in a row.   - Adjustment parameters are given to her for hypo and hyperglycemia in writing.  - her Marcelline Deist was discontinued, due to polyuria and increased risk of UTI and yeast infections.  I also stopped her Novolog to prevent hypoglycemia.  - Specific targets for  A1c; LDL, HDL, and Triglycerides were discussed with the patient.  2) Blood Pressure /Hypertension:  her blood pressure is controlled to target.   she  is advised to continue her current medications including Metoprolol 200 mg p.o. daily with breakfast, Lisinopril 20 mg po daily, Lasix 40 mg po daily.  3) Lipids/Hyperlipidemia:    Recent lipid panel from 09/25/23 shows controlled LDL of 45.  she is advised to continue Simvastatin 40 mg daily at bedtime.  Side effects and precautions discussed with her.    4)  Weight/Diet:  her Body mass index is 32.45 kg/m.  -  clearly complicating her diabetes care.   she is a candidate for weight loss. I discussed with her the fact that loss of 5 - 10% of her  current body weight will have the most impact on her diabetes management.  Exercise, and detailed carbohydrates information provided  -  detailed on discharge instructions.  5) Chronic Care/Health Maintenance: -she is on ACEI/ARB and Statin medications and is encouraged  to initiate and continue to follow up with Ophthalmology, Dentist, Podiatrist at least yearly or according to recommendations, and advised to stay away from smoking. I have recommended yearly flu vaccine and pneumonia vaccine at least every 5 years; moderate intensity exercise for up to 150 minutes weekly; and sleep for at least 7 hours a day.  - she is advised to maintain close follow up with Kirstie Peri, MD for primary care needs, as well as her other providers for optimal and coordinated care.     I spent  46  minutes in the care of the patient today including review of labs from CMP, Lipids, Thyroid Function, Hematology (current and previous including abstractions from other facilities); face-to-face time discussing  her blood glucose readings/logs, discussing hypoglycemia and hyperglycemia episodes and symptoms, medications doses, her options of short and long term treatment based on the latest standards of care / guidelines;  discussion about incorporating lifestyle medicine;  and documenting the encounter. Risk reduction counseling performed per USPSTF guidelines to reduce obesity  and cardiovascular risk factors.     Please refer to Patient Instructions for Blood Glucose Monitoring and Insulin/Medications Dosing Guide"  in media tab for additional information. Please  also refer to " Patient Self Inventory" in the Media  tab for reviewed elements of pertinent patient history.  Stacy Frost participated in the discussions, expressed understanding, and voiced agreement with the above plans.  All questions were answered to her satisfaction. she is encouraged to contact clinic should she have any questions or concerns prior to her return visit.     Follow up plan: - Return in about 3 months (around 01/01/2024) for Diabetes F/U with A1c in office, No previsit labs, Bring meter and logs.   Ronny Bacon, [facility] [address] [place], [place] [address] Phone: [phone] Fax: [phone]  10/03/2023, 11:20 AM

## 2023-10-05 ENCOUNTER — Encounter (HOSPITAL_COMMUNITY): Payer: 59

## 2023-10-08 ENCOUNTER — Telehealth: Payer: Self-pay | Admitting: *Deleted

## 2023-10-08 ENCOUNTER — Encounter (HOSPITAL_COMMUNITY): Payer: 59

## 2023-10-08 NOTE — Telephone Encounter (Signed)
Patient left a message asking if we had samples of the Semglee and needles. Her pharmacy is going to charge her $189 for both.Again wanted to know this and or if Alphonzo Lemmings had any other samples of something that she may use?

## 2023-10-08 NOTE — Telephone Encounter (Signed)
If we have any long-acting insulin we can give her that to use as a substitute for now while we work out a plan b.  But best option would be to check with her insurance to find out which insulin would be covered at the cheapest copay.

## 2023-10-09 NOTE — Telephone Encounter (Signed)
Patient was called and made aware, per Stacy Frost we are going to sample her [place]. Patient will administer 20 units at night.She is going to call her insurance company to see what insulin in this family of insulins would be or have the lowest copay. She will let us know.

## 2023-10-17 ENCOUNTER — Ambulatory Visit: Payer: 59 | Attending: Cardiology

## 2023-10-17 DIAGNOSIS — R29898 Other symptoms and signs involving the musculoskeletal system: Secondary | ICD-10-CM | POA: Diagnosis not present

## 2023-10-18 ENCOUNTER — Other Ambulatory Visit: Payer: Self-pay | Admitting: Physician Assistant

## 2023-10-18 ENCOUNTER — Other Ambulatory Visit: Payer: Self-pay | Admitting: Internal Medicine

## 2023-10-18 LAB — VAS US ABI WITH/WO TBI
Left ABI: 1.2
Right ABI: 1.18

## 2023-10-22 ENCOUNTER — Other Ambulatory Visit: Payer: Self-pay | Admitting: Nurse Practitioner

## 2023-10-22 DIAGNOSIS — T148XXA Other injury of unspecified body region, initial encounter: Secondary | ICD-10-CM

## 2023-10-22 DIAGNOSIS — R29898 Other symptoms and signs involving the musculoskeletal system: Secondary | ICD-10-CM

## 2023-10-22 DIAGNOSIS — R600 Localized edema: Secondary | ICD-10-CM

## 2023-10-22 DIAGNOSIS — I5032 Chronic diastolic (congestive) heart failure: Secondary | ICD-10-CM

## 2023-10-30 ENCOUNTER — Ambulatory Visit: Payer: 59 | Attending: Nurse Practitioner | Admitting: Nurse Practitioner

## 2023-10-30 ENCOUNTER — Encounter: Payer: Self-pay | Admitting: Nurse Practitioner

## 2023-10-30 VITALS — BP 125/73 | HR 99 | Ht 65.0 in | Wt 197.8 lb

## 2023-10-30 DIAGNOSIS — I5032 Chronic diastolic (congestive) heart failure: Secondary | ICD-10-CM

## 2023-10-30 DIAGNOSIS — Z01810 Encounter for preprocedural cardiovascular examination: Secondary | ICD-10-CM | POA: Diagnosis not present

## 2023-10-30 DIAGNOSIS — I10 Essential (primary) hypertension: Secondary | ICD-10-CM

## 2023-10-30 DIAGNOSIS — R002 Palpitations: Secondary | ICD-10-CM | POA: Diagnosis not present

## 2023-10-30 DIAGNOSIS — I951 Orthostatic hypotension: Secondary | ICD-10-CM

## 2023-10-30 DIAGNOSIS — Z952 Presence of prosthetic heart valve: Secondary | ICD-10-CM

## 2023-10-30 DIAGNOSIS — R29898 Other symptoms and signs involving the musculoskeletal system: Secondary | ICD-10-CM

## 2023-10-30 DIAGNOSIS — I739 Peripheral vascular disease, unspecified: Secondary | ICD-10-CM

## 2023-10-30 DIAGNOSIS — R296 Repeated falls: Secondary | ICD-10-CM

## 2023-10-30 DIAGNOSIS — E785 Hyperlipidemia, unspecified: Secondary | ICD-10-CM

## 2023-10-30 MED ORDER — METOPROLOL SUCCINATE ER 25 MG PO TB24: 25.0000 mg | ORAL_TABLET | Freq: Two times a day (BID) | ORAL | Status: DC

## 2023-10-30 NOTE — Patient Instructions (Addendum)
 Medication Instructions:  Your physician has recommended you make the following change in your medication:  Please increase Metoprolol  to 25 Mg twice daily   Labwork: none  Testing/Procedures: none  Follow-Up: Your physician recommends that you schedule a follow-up appointment in: as scheduled   Any Other Special Instructions Will Be Listed Below (If Applicable).  If you need a refill on your cardiac medications before your next appointment, please call your pharmacy.

## 2023-10-30 NOTE — Progress Notes (Addendum)
 Cardiology Office Note:  .   Date:  10/30/2023  ID:  Stacy Frost, DOB 1961/07/05, MRN [id number] PCP: Stacy Isles, MD  [facility] Providers Cardiologist:  Stacy Gull, MD    History of Present Illness: .   Stacy Frost is a 62 y.o. female with a PMH of severe aortic stenosis due to bicuspid aortic valve, s/p AVR, type 2 diabetes, dyslipidemia, hypertension, obesity, and migraine headaches, who presents today for post AVR follow-up.   Was initially referred to Dr. Shyrl for evaluation for severe aortic stenosis.  She was admitted for suffering syncopal episodes at home while using bathroom.  Required urgent admission and surgical intervention.  Underwent AVR with 25 mm [employer] Inspiris Bovine pericardial tissue valve on 05/14/2023.  She was diuresed routinely postprocedure due to expected volume excess.  She also had expected acute blood loss anemia, required transfusion with 1 unit PRBC.  07/17/2023 - Today she presents for AVR follow-up. She states she is doing well. Shows me her log from home.  Overall her blood pressure and vital signs are stable.  Weight at one point does show some fluctuation and increase and was experiencing SHOB during this time.  She called into the office and was instructed to take an extra tablet of Lasix  and then shows that her weight went back to her baseline.  Ever since then, her weight has been stable. Denies any chest pain, recent shortness of breath, palpitations, syncope, presyncope, dizziness, orthopnea, PND, recent swelling or significant weight changes, acute bleeding, or claudication. Does report nausea and loss of appetite.   09/25/2023 - Today she presents for follow-up.  Her daughter-in-law is present via FaceTime (with patient's permission and consent), who has several concerns regarding her mother-in-law.  Her daughter-in-law is an [employer].  Daughter-in-law mentions frequent falls and weakness that has gotten worse recently.  Since I last saw  patient in office, there has been estimated 10 times patient has fallen.  She is scheduled to see her PCP soon regarding several these issues.  Daughter-in-law wonders if her BP is dropping that is contributing to her symptoms.  Says she has a bruise on her right lateral rib cage from a past fall.  She also has multiple infected teeth and currently just finished a prescription of amoxicillin  for this.  Says she sent preoperative clearance to Dr. Nada office, and has not heard back. Pt confirms she is taking Lasix  daily instead of PRN. Denies any chest pain, shortness of breath, palpitations, syncope, presyncope, orthopnea, PND, recent swelling or significant weight changes, acute bleeding, or claudication.   10/30/2023-she presents today for follow-up.  She tells me that since lowering dose of metoprolol  at last visit, says she feels that her heart is racing at times.  Still having falls and denies any acute injuries or head injury.  Denies any syncope, chest pain, or shortness of breath.  Says she is scheduled for MRI for further evaluation regarding her falls.  She remains on antibiotics. Denies any chest pain, shortness of breath, syncope, presyncope, dizziness, orthopnea, PND, swelling or significant weight changes, acute bleeding, or claudication.  ROS: Negative. See HPI.   Studies Reviewed: SABRA    ABI 09/2023: Summary:  Right: Resting right ankle-brachial index is within normal range. The  right toe-brachial index is abnormal.   Left: Resting left ankle-brachial index is within normal range. The left  toe-brachial index is abnormal.    *See table(s) above for measurements and observations.  Limited Echo 08/2023:  1.  Left ventricular ejection fraction, by estimation, is 65 to 70%. The  left ventricle has normal function. The left ventricle has no regional  wall motion abnormalities. There is mild left ventricular hypertrophy.   2. Right ventricular systolic function is normal. The right  ventricular  size is normal.   3. 25mm Inspiris Valve is in the AV position with normal function. . The  aortic valve has been repaired/replaced. Aortic valve regurgitation is not  visualized. No aortic stenosis is present.   4. IVC is small suggesting low RA pressure and hypovolemia.   5. Limited echo evaluate aortic valve function   Echo 05/2023:  1. Left ventricular ejection fraction, by estimation, is 60 to 65%. The  left ventricle has normal function. The left ventricle has no regional  wall motion abnormalities. There is moderate left ventricular hypertrophy. Left ventricular diastolic parameters are consistent with Grade I diastolic dysfunction (impaired relaxation).   2. Right ventricular systolic function is normal. The right ventricular  size is normal. There is normal pulmonary artery systolic pressure.   3. The mitral valve is normal in structure. No evidence of mitral valve  regurgitation. No evidence of mitral stenosis.   4. The tricuspid valve is abnormal.   5. 25mm Inspiris Valve is in the AV position. . The aortic valve has been repaired/replaced. Aortic valve regurgitation is not visualized. No aortic stenosis is present.   6. The inferior vena cava is normal in size with greater than 50%  respiratory variability, suggesting right atrial pressure of 3 mmHg.  TEE 04/2023:  Complications: No known complications during this procedure.  POST-OP IMPRESSIONS  _ Left Ventricle: The left ventricle is unchanged from pre-bypass.  _ Right Ventricle: The right ventricle appears unchanged from pre-bypass.  _ Aortic Valve: A bioprosthetic bioprosthetic valve was placed, leaflets  are  freely mobile and leaflets thin. Manufactured by; inspiris Size; 25mm.  There is  no regurgitation or stenosis of the new aortic valve.  _ Mitral Valve: The mitral valve appears unchanged from pre-bypass.   PRE-OP FINDINGS   Left Ventricle: The left ventricle has normal systolic function, with an   ejection fraction of 55-60%. The cavity size was normal. There is mildly  increased left ventricular wall thickness. There is mild concentric left  ventricular hypertrophy.  R/LHC 04/2023:  Normal coronary anatomy Normal LV filling pressure. PCWP 19/22 mean 13 mm Hg Normal right heart pressures. PAP 31/13 with mean 22 mm Hg Normal cardiac output 5.54 L/min, index 2.90   Plan: valve team assessment for AVR     Physical Exam:   VS:  BP 125/73 (BP Location: Left Arm, Patient Position: Sitting, Cuff Size: Normal)   Pulse 99   Ht 5' 5 (1.651 m)   Wt 197 lb 12.8 oz (89.7 kg)   SpO2 98%   BMI 32.92 kg/m    Wt Readings from Last 3 Encounters:  10/30/23 197 lb 12.8 oz (89.7 kg)  10/03/23 195 lb (88.5 kg)  09/25/23 192 lb 12.8 oz (87.5 kg)    GEN: Obese, 62 y.o. female in no acute distress NECK: No JVD; No carotid bruits CARDIAC: S1/S2, RRR, no murmurs, rubs, gallops RESPIRATORY:  Clear to auscultation without rales, wheezing or rhonchi  ABDOMEN: Soft, non-tender, non-distended EXTREMITIES:  No edema; No deformity   ASSESSMENT AND PLAN: .    AS, s/p AVR, palpitations Most recent limited echo reviewed and revealed normal EF, normal function of AV prosthesis.  Patient remains on antibiotics as she has multiple  infected teeth that need to be pulled-see preoperative clearance below.  SBE prophylaxis discussed and she verbalized understanding.  Will increase metoprolol  succinate to 25 mg twice daily to help with palpitations.  Continue rest of current medication regimen.  Heart healthy diet and regular cardiovascular exercise encouraged. Care and ED precautions discussed.     2.  HFpEF Stage C, NYHA class I-II symptoms.  EF previously was found to be normal. Euvolemic and well compensated on exam.  Increasing metoprolol  succinate to 25 mg twice daily.  No other medication changes at this time.  Low sodium diet, fluid restriction <2L, and daily weights encouraged. Educated to contact our  office for weight gain of 2 lbs overnight or 5 lbs in one week.  GDMT limited at this time due to her orthostatic hypotension-see below.  3.  Orthostatic hypotension, HTN  At last office visit, orthostatics obtained were positive for orthostatic hypotension. Pt was symptomatic.  Care precautions discussed.  Previously prescribed compression stockings.  Medication changes noted above.  Discussed to monitor BP at home at least 2 hours after medications and sitting for 5-10 minutes.  No other medication changes at this time.  Heart healthy diet encouraged. Discussed conservative measures.  Care and ED precautions discussed.  4. Dyslipidemia She has pending future labs with endocrinology.  Continue atorvastatin . Heart healthy diet encouraged.   5. Weakness, multiple falls, PAD Etiology unclear.  Denies any acute injuries or recent head injury.  She is pending a future MRI.  See past ABIs as mentioned above.  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 Recommended to f/u with PCP. Care and ED precautions discussed.   6. Pre-operative cardiovascular risk assessment Ms. Menn's perioperative risk of a major cardiac event is 0.9% according to the Revised Cardiac Risk Index (RCRI).  Therefore, she is at low risk for perioperative complications.   Her functional capacity is poor at 3.97 METs according to the Duke Activity Status Index (DASI). Recommendations: According to ACC/AHA guidelines, no further cardiovascular testing needed.  The patient may proceed to surgery at acceptable risk.   Antiplatelet and/or Anticoagulation Recommendations: Patient will require SBE prophylaxis prior to procedure.  I confirmed with patient that she remains on antibiotics and instructed her to continue this until her procedure.  She verbalized understanding.  Dispo: Care and ED precautions discussed.  Follow-up with Dr. Okey or APP as scheduled or sooner if anything  changes.  Signed, Almarie Crate, NP

## 2023-11-12 ENCOUNTER — Other Ambulatory Visit (HOSPITAL_COMMUNITY): Payer: Self-pay | Admitting: Neurosurgery

## 2023-11-12 DIAGNOSIS — R200 Anesthesia of skin: Secondary | ICD-10-CM

## 2023-11-16 ENCOUNTER — Other Ambulatory Visit: Payer: Self-pay | Admitting: Nurse Practitioner

## 2023-11-16 DIAGNOSIS — E1165 Type 2 diabetes mellitus with hyperglycemia: Secondary | ICD-10-CM

## 2023-11-17 ENCOUNTER — Other Ambulatory Visit: Payer: Self-pay | Admitting: Nurse Practitioner

## 2023-11-23 ENCOUNTER — Ambulatory Visit (HOSPITAL_COMMUNITY)
Admission: RE | Admit: 2023-11-23 | Discharge: 2023-11-23 | Disposition: A | Discharge status: Home / Self Care | Payer: 59 | Source: Ambulatory Visit | Attending: Neurosurgery | Admitting: Neurosurgery

## 2023-11-23 DIAGNOSIS — R200 Anesthesia of skin: Secondary | ICD-10-CM | POA: Insufficient documentation

## 2023-12-02 NOTE — Progress Notes (Unsigned)
Cardiology Office Note   Date:  12/02/2023   ID:  Stacy Frost, DOB 31-Jan-1961, MRN [id number]  PCP:  Stacy Peri, MD  Cardiologist:   Stacy Pates, MD   Patient referred for evaluation of DOE       History of Present Illness: Stacy Frost is a 63 y.o. female with a history of T2DM, migraine HA, bicuspid AV.  Last echo in 2019 showed mean gradietn 20 mm Hx   Moderate AS    The pt says that over the past few months she hsa been SOB with exertion.   Symptoms got particularly bad over the past couple weeks   She was in the [employer] parking lot recently, got out of car, took several steps then had to stop  Couldn't breathe   Had to grab onto a cart. She has had some chest tightness when she is SOB   None at rest    She has been dizzy but denies syncope     I saw the pt in clinic in July 2024   She was seen by NP since   No outpatient medications have been marked as taking for the 12/03/23 encounter (Appointment) with Stacy Riffle, MD.     Allergies:   Amlodipine besylate, Morphine and codeine, Amitriptyline, Butorphanol tartrate, Compazine [prochlorperazine edisylate], Rocephin [ceftriaxone sodium in dextrose], and Sumatriptan   Past Medical History:  Diagnosis Date   Anxiety    Aortic stenosis    Mild to moderate   Bicuspid aortic valve    Essential hypertension, benign    GERD (gastroesophageal reflux disease)    History of cardiac catheterization    Normal coronaries 2008   Hyperlipidemia    Migraine headache    MVP (mitral valve prolapse)    Obesity    Pain in joint, ankle and foot    Chronic   Palpitations    Documented PACs by previous monitoring   Restless leg syndrome    Skin tag    Type 2 diabetes mellitus (HCC)    Unilateral primary osteoarthritis, right knee 09/06/2016    Past Surgical History:  Procedure Laterality Date   ACHILLES TENDON REPAIR  2003   Left   AORTIC VALVE REPLACEMENT N/A 05/14/2023   Procedure: AORTIC VALVE REPLACEMENT (AVR);  Surgeon:  Stacy Skains, MD;  Location[facility] OR;  Service: Open Heart Surgery;  Laterality: N/A;   CARDIAC CATHETERIZATION  2008   Normal coronary arteries; normal LV systolic function; mild dilation of aortic root; mild dilation of proximal ascending aorta; likely bicuspid aortic valve   CHOLECYSTECTOMY  2001   "Poor EF at 17%"   COLONOSCOPY N/A 07/17/2014   Procedure: COLONOSCOPY;  Surgeon: Stacy Hippo, MD;  Location: [facility] SUITE;  Service: Endoscopy;  Laterality: N/A;  105   ESOPHAGEAL DILATION N/A 06/11/2015   Procedure: ESOPHAGEAL DILATION;  Surgeon: Stacy Hippo, MD;  Location: [facility] ORS;  Service: Endoscopy;  Laterality: N/AElease Hashimoto 54/56/58   ESOPHAGOGASTRODUODENOSCOPY N/A 07/17/2014   Procedure: ESOPHAGOGASTRODUODENOSCOPY (EGD);  Surgeon: Stacy Hippo, MD;  Location: [facility];  Service: Endoscopy;  Laterality: N/A;   ESOPHAGOGASTRODUODENOSCOPY (EGD) WITH PROPOFOL N/A 06/11/2015   Procedure: ESOPHAGOGASTRODUODENOSCOPY (EGD) WITH PROPOFOL;  Surgeon: Stacy Hippo, MD;  Location: [facility] ORS;  Service: Endoscopy;  Laterality: N/A;  730   HERNIA REPAIR     umbilical    KNEE ARTHROSCOPY WITH MEDIAL MENISECTOMY Right 07/02/2018   Procedure: RIGHT KNEE ARTHROSCOPY, DEBRIDEMENT, MEDIAL MENISECTOMY;  Surgeon: Stacy Frost  W, MD;  Location: [facility] OR;  Service: Orthopedics;  Laterality: Right;   KNEE SURGERY     MALONEY DILATION N/A 07/17/2014   Procedure: [name] DILATION;  Surgeon: Stacy Hippo, MD;  Location: [facility];  Service: Endoscopy;  Laterality: N/A;   RIGHT/LEFT HEART CATH AND CORONARY ANGIOGRAPHY N/A 05/02/2023   Procedure: RIGHT/LEFT HEART CATH AND CORONARY ANGIOGRAPHY;  Surgeon: Swaziland, Peter M, MD;  Location: [facility] INVASIVE CV LAB;  Service: Cardiovascular;  Laterality: N/A;   TEE WITHOUT CARDIOVERSION N/A 05/14/2023   Procedure: TRANSESOPHAGEAL ECHOCARDIOGRAM;  Surgeon: Stacy Skains, MD;  Location: [facility] OR;  Service: Open Heart Surgery;  Laterality: N/A;   UMBILICAL  HERNIA REPAIR  2003     Social History:  The patient  reports that she has never smoked. She has never used smokeless tobacco. She reports that she does not drink alcohol and does not use drugs.   Family History:  The patient's family history includes Anxiety disorder in her daughter; Brain cancer in her paternal grandmother; Cancer in her mother; Depression in her daughter, father, and mother; Diabetes in her mother; Emphysema in her maternal grandfather; Heart attack in her father and paternal grandfather; Hypertension in her father; Stroke in her daughter; Transient ischemic attack in her brother.    ROS:  Please see the history of present illness. All other systems are reviewed and  Negative to the above problem except as noted.    PHYSICAL EXAM: VS:  There were no vitals taken for this visit.  GEN: Well nourished, well developed, in no acute distress  HEENT: normal  Neck: JVP is normal   Mild radiating murmur  Cardiac: RRR; Gr III/VI mid peaking systolic murmur   Decresaed S2  No LE edema  Respiratory:  clear to auscultation bilaterally,  GI: soft, nontender, No hepatomegaly  MS: no deformity Moving all extremities   Skin: warm and dry, no rash Neuro:  Strength and sensation are intact Psych: euthymic mood, full affect   EKG:  EKG is ordered today.  SR 82 bpm  Septal MI   Echo   2019  - Left ventricle: The cavity size was normal. Wall thickness was    increased in a pattern of mild LVH. There was mild focal basal    hypertrophy of the septum. Systolic function was vigorous. The    estimated ejection fraction was in the range of 65% to 70%. Wall    motion was normal; there were no regional wall motion    abnormalities. Doppler parameters are consistent with abnormal    left ventricular relaxation (grade 1 diastolic dysfunction).  - Aortic valve: Mildly calcified annulus. Functionally bicuspid;    moderately thickened, moderately calcified leaflets. There was    trivial  regurgitation. Mean gradient (S): 20 mm Hg. Peak gradient    (S): 46 mm Hg. VTI ratio of LVOT to aortic valve: 0.27. Valve    area (VTI): 0.93 cm^2. Difficult to get accurate valve planimetry    but aortic stenosis looks to be at least in the moderate range.    Gradients have increased compared to the previous study.  - Mitral valve: There was trivial regurgitation.  - Tricuspid valve: There was trivial regurgitation.  - Pulmonary arteries: PA peak pressure: 21 mm Hg (S).  - Pericardium, extracardiac: There was no pericardial effusion.   Impressions:   - Mild LVH with LVEF 65-70% and grade 1 diastolic dysfunction.    Functionally bicuspid aortic valve, thickened and calcified with  trivial aortic regurgitation and at least moderate aortic    stenosis. Trivial tricuspid regurgitation with estimated PASP 21    mm&'s mercury.   Lipid Panel    Component Value Date/Time   CHOL 165 06/29/2008 0842   TRIG 206 06/29/2008 0842   HDL 42 06/29/2008 0842   LDLCALC 82 06/29/2008 0842      Wt Readings from Last 3 Encounters:  10/30/23 197 lb 12.8 oz (89.7 kg)  10/03/23 195 lb (88.5 kg)  09/25/23 192 lb 12.8 oz (87.5 kg)      ASSESSMENT AND PLAN:  1  Aortic stenosis/   Pt with reported bicuspid valve.  Moderate AS on echo in 2019   Now with symptoms of chest pressure, SOB, dizziness  Very concerning   AS is critical      Pt just had an echo done    LVEF and RVEF appear normal   AV is calcified with restricted motion.  MEan gradient through the valve is 53 mm Hg     I have reviewed with patient    She is symptomatic from AS   I would reocmm R/L heart cath to define anatomy and pressures.  Then get evaluated for AVR  Will get labs today     Minimal activiyt until evaluated     2  HTN  BP is contrlled on current regimen  3  HL  LDL 89 on last check   ON statin   Follow for right now   Will need to be advanced   4  T2DM   Last A1C 6.2        Current medicines are reviewed at  length with the patient today.  The patient does not have concerns regarding medicines.  Signed, Stacy Pates, MD  12/02/2023 7:48 PM    [facility] [address] [place], [place], [place]  [address] Phone: [phone]; Fax: [phone]

## 2023-12-03 ENCOUNTER — Ambulatory Visit: Payer: 59 | Attending: Internal Medicine | Admitting: Internal Medicine

## 2023-12-03 ENCOUNTER — Other Ambulatory Visit (HOSPITAL_COMMUNITY)
Admission: RE | Admit: 2023-12-03 | Discharge: 2023-12-03 | Disposition: A | Discharge status: Home / Self Care | Payer: 59 | Source: Ambulatory Visit | Attending: Internal Medicine | Admitting: Internal Medicine

## 2023-12-03 ENCOUNTER — Other Ambulatory Visit: Payer: Self-pay

## 2023-12-03 VITALS — BP 120/72 | HR 94 | Ht 66.0 in | Wt 195.8 lb

## 2023-12-03 DIAGNOSIS — I872 Venous insufficiency (chronic) (peripheral): Secondary | ICD-10-CM

## 2023-12-03 DIAGNOSIS — E785 Hyperlipidemia, unspecified: Secondary | ICD-10-CM

## 2023-12-03 LAB — HEPATIC FUNCTION PANEL
ALT: 22 U/L (ref 0–44)
AST: 23 U/L (ref 15–41)
Albumin: 3.7 g/dL (ref 3.5–5.0)
Alkaline Phosphatase: 82 U/L (ref 38–126)
Bilirubin, Direct: 0.1 mg/dL (ref 0.0–0.2)
Indirect Bilirubin: 1 mg/dL — ABNORMAL HIGH (ref 0.3–0.9)
Total Bilirubin: 1.1 mg/dL (ref 0.0–1.2)
Total Protein: 7.5 g/dL (ref 6.5–8.1)

## 2023-12-03 MED ORDER — METOPROLOL SUCCINATE ER 25 MG PO TB24: 25.0000 mg | ORAL_TABLET | Freq: Two times a day (BID) | ORAL | Status: DC

## 2023-12-03 NOTE — Patient Instructions (Signed)
Medication Instructions:  Your physician recommends that you continue on your current medications as directed. Please refer to the Current Medication list given to you today.  *If you need a refill on your cardiac medications before your next appointment, please call your pharmacy*   Lab Work: Your physician recommends that you return for lab work in: Today   If you have labs (blood work) drawn today and your tests are completely normal, you will receive your results only by: MyChart Message (if you have MyChart) OR A paper copy in the mail If you have any lab test that is abnormal or we need to change your treatment, we will call you to review the results.   Testing/Procedures: NONE    Follow-Up: At [facility], you and your health needs are our priority.  As part of our continuing mission to provide you with exceptional heart care, we have created designated Provider Care Teams.  These Care Teams include your primary Cardiologist (physician) and Advanced Practice Providers (APPs -  Physician Assistants and Nurse Practitioners) who all work together to provide you with the care you need, when you need it.  We recommend signing up for the patient portal called "MyChart".  Sign up information is provided on this After Visit Summary.  MyChart is used to connect with patients for Virtual Visits (Telemedicine).  Patients are able to view lab/test results, encounter notes, upcoming appointments, etc.  Non-urgent messages can be sent to your provider as well.   To learn more about what you can do with MyChart, go to [email or web address].    Your next appointment:    August   Provider:   Dietrich Pates, MD    Other Instructions Thank you for choosing [facility]!

## 2023-12-04 ENCOUNTER — Telehealth: Payer: Self-pay | Admitting: *Deleted

## 2023-12-04 LAB — NMR, LIPOPROFILE
Cholesterol, Total: 122 mg/dL (ref 100–199)
HDL Cholesterol by NMR: 50 mg/dL (ref >39)
HDL Particle Number: 32.3 umol/L (ref >=30.5)
LDL Particle Number: 679 nmol/L (ref <1000)
LDL Size: 20.3 nmol — ABNORMAL LOW (ref >20.5)
LDL-C (NIH Calc): 51 mg/dL (ref 0–99)
LP-IR Score: 53 — ABNORMAL HIGH (ref <=45)
Small LDL Particle Number: 459 nmol/L (ref <=527)
Triglycerides by NMR: 116 mg/dL (ref 0–149)

## 2023-12-04 NOTE — Telephone Encounter (Signed)
 Patient called and left a message that she went to the pharmacy to get her medicine to include her Mounjaro . The pharmacy advised the patient that they had sent to us  2 faxes indicating that this needed a PA. I have reached out to Luke Chang, LPN to see if she had seen anything. She has not. I am sending this to the RX PA team for review. Patient states that her insurance is the very same, no changes for the year.

## 2023-12-05 ENCOUNTER — Other Ambulatory Visit (HOSPITAL_COMMUNITY): Payer: Self-pay

## 2023-12-05 ENCOUNTER — Telehealth: Payer: Self-pay

## 2023-12-05 LAB — LIPOPROTEIN A (LPA): Lipoprotein (a): <8.4 nmol/L

## 2023-12-05 NOTE — Telephone Encounter (Signed)
 Pharmacy Patient Advocate Encounter   Received notification from Pt Calls Messages that prior authorization for Mounjaro  is required/requested.   Insurance verification completed.   The patient is insured through [facility] .   Per test claim: PA required; PA submitted to above mentioned insurance via CoverMyMeds Key/confirmation #/EOC [id number] Status is pending

## 2023-12-05 NOTE — Telephone Encounter (Signed)
 Noted. PA has been submitted and we are awaiting the outcome.

## 2023-12-06 ENCOUNTER — Encounter: Payer: Self-pay | Admitting: Internal Medicine

## 2023-12-11 NOTE — Telephone Encounter (Signed)
Talked with the patient. She states that her blood sugars are running 300-400. Asking if she needs to get in sooner than later to see Whitney. Patient and all of her household has the Covid. Shared with the patient that any antibiotics, steroids can cause a increase in blood sugars. Will address with Whitney and get back to her. Patient says that she is wearing her Dexcom G7, she is using the receiver.

## 2023-12-11 NOTE — Telephone Encounter (Signed)
Pharmacy Patient Advocate Encounter  Received notification from [facility] that Prior Authorization for Stacy Frost has been APPROVED through 12/04/2024   PA #/Case ID/Reference #: [id number]

## 2023-12-13 ENCOUNTER — Telehealth: Payer: Self-pay | Admitting: Internal Medicine

## 2023-12-13 LAB — MISC LABCORP TEST (SEND OUT): Labcorp test code: 167015

## 2023-12-13 NOTE — Telephone Encounter (Signed)
Stacy Frost with [facility] calling to advise that the Apoliprotein specimen-sent to [facility] had an accident with specimen and was unable to perform. Will need to advise if needs to be redone

## 2023-12-13 NOTE — Telephone Encounter (Signed)
Not sure I have a sooner appt.  Can she send some recent glucose readings?  At least 3 days worth will be sufficient.  Likely COVID is driving up her glucose readings and we may need to increase her Semglee until she gets totally over the infection and lingering side effects.

## 2023-12-14 ENCOUNTER — Ambulatory Visit (HOSPITAL_COMMUNITY): Payer: 59

## 2023-12-14 ENCOUNTER — Other Ambulatory Visit: Payer: Self-pay | Admitting: Neurosurgery

## 2023-12-17 ENCOUNTER — Other Ambulatory Visit: Payer: Self-pay | Admitting: Nurse Practitioner

## 2023-12-17 DIAGNOSIS — Z794 Long term (current) use of insulin: Secondary | ICD-10-CM

## 2023-12-17 NOTE — Telephone Encounter (Signed)
 Noted

## 2023-12-20 NOTE — Telephone Encounter (Signed)
 Patient was called and made aware.

## 2023-12-20 NOTE — Telephone Encounter (Signed)
Have her increase Semglee to 30 units for now to help her get back on track after her illness.

## 2023-12-20 NOTE — Telephone Encounter (Signed)
Talked with Stacy Frost. She could only provide a reading for today. This morning it was 266 and while talking with me it was 222. She said that before this past Saturday,12/15/23, her blood sugars had been in the 400 -300 range as she and her family have had COVID. Today has been the best day so far. I tried to talk her through the CGM but it was not successful. She has been injecting the Semglee 20 units at 6 PM daily and taking her Metformin as directed. Patient was advised that if there were any recommendations we would call her back.

## 2023-12-31 DIAGNOSIS — I739 Peripheral vascular disease, unspecified: Secondary | ICD-10-CM | POA: Insufficient documentation

## 2024-01-01 ENCOUNTER — Ambulatory Visit (INDEPENDENT_AMBULATORY_CARE_PROVIDER_SITE_OTHER): Admitting: Vascular Surgery

## 2024-01-01 ENCOUNTER — Encounter: Payer: Self-pay | Admitting: Vascular Surgery

## 2024-01-01 VITALS — BP 144/84 | HR 85 | Resp 18 | Ht 66.0 in | Wt 192.2 lb

## 2024-01-01 DIAGNOSIS — I739 Peripheral vascular disease, unspecified: Secondary | ICD-10-CM | POA: Diagnosis not present

## 2024-01-01 NOTE — Progress Notes (Signed)
 Patient name: Stacy Frost MRN: [id number] DOB: Jan 16, 1961 Sex: female  REASON FOR CONSULT: Leg weakness with abnormal TBI's  HPI: Stacy Frost is a 63 y.o. female, with history of hypertension, hyperlipidemia, diabetes, back pain that presents for evaluation of leg weakness with abnormal TBI's.  Patient states for the last several months she has had numbness in both legs from the mid calves down into the feet.  States is tingling feeling.  Walks with a walker as she states she is unsteady and falls.  Is scheduled for lumbar surgery for spinal stenosis with Dr. Lovell Sheehan in [place] for PLIF later this month.  ABIs 10/17/2023 were 1.18 on the right triphasic and 1.20 on the left triphasic and both within normal range.  Past Medical History:  Diagnosis Date   Anxiety    Aortic stenosis    Mild to moderate   Bicuspid aortic valve    Essential hypertension, benign    GERD (gastroesophageal reflux disease)    History of cardiac catheterization    Normal coronaries 2008   Hyperlipidemia    Migraine headache    MVP (mitral valve prolapse)    Obesity    Pain in joint, ankle and foot    Chronic   Palpitations    Documented PACs by previous monitoring   Restless leg syndrome    Skin tag    Type 2 diabetes mellitus (HCC)    Unilateral primary osteoarthritis, right knee 09/06/2016    Past Surgical History:  Procedure Laterality Date   ACHILLES TENDON REPAIR  2003   Left   AORTIC VALVE REPLACEMENT N/A 05/14/2023   Procedure: AORTIC VALVE REPLACEMENT (AVR);  Surgeon: Corliss Skains, MD;  Location: [facility] OR;  Service: Open Heart Surgery;  Laterality: N/A;   CARDIAC CATHETERIZATION  2008   Normal coronary arteries; normal LV systolic function; mild dilation of aortic root; mild dilation of proximal ascending aorta; likely bicuspid aortic valve   CHOLECYSTECTOMY  2001   "Poor EF at 17%"   COLONOSCOPY N/A 07/17/2014   Procedure: COLONOSCOPY;  Surgeon: Malissa Hippo, MD;  Location: [facility]  ENDO SUITE;  Service: Endoscopy;  Laterality: N/A;  105   ESOPHAGEAL DILATION N/A 06/11/2015   Procedure: ESOPHAGEAL DILATION;  Surgeon: Malissa Hippo, MD;  Location: [facility] ORS;  Service: Endoscopy;  Laterality: N/AElease Frost 54/56/58   ESOPHAGOGASTRODUODENOSCOPY N/A 07/17/2014   Procedure: ESOPHAGOGASTRODUODENOSCOPY (EGD);  Surgeon: Malissa Hippo, MD;  Location: [facility] ENDO SUITE;  Service: Endoscopy;  Laterality: N/A;   ESOPHAGOGASTRODUODENOSCOPY (EGD) WITH PROPOFOL N/A 06/11/2015   Procedure: ESOPHAGOGASTRODUODENOSCOPY (EGD) WITH PROPOFOL;  Surgeon: Malissa Hippo, MD;  Location: [facility] ORS;  Service: Endoscopy;  Laterality: N/A;  730   HERNIA REPAIR     umbilical    KNEE ARTHROSCOPY WITH MEDIAL MENISECTOMY Right 07/02/2018   Procedure: RIGHT KNEE ARTHROSCOPY, DEBRIDEMENT, MEDIAL MENISECTOMY;  Surgeon: [name], MD;  Location: [facility] OR;  Service: Orthopedics;  Laterality: Right;   KNEE SURGERY     MALONEY DILATION N/A 07/17/2014   Procedure: Stacy Frost DILATION;  Surgeon: Malissa Hippo, MD;  Location: [facility] ENDO SUITE;  Service: Endoscopy;  Laterality: N/A;   RIGHT/LEFT HEART CATH AND CORONARY ANGIOGRAPHY N/A 05/02/2023   Procedure: RIGHT/LEFT HEART CATH AND CORONARY ANGIOGRAPHY;  Surgeon: [place]az[place], Peter M, MD;  Location: [facility] INVASIVE CV LAB;  Service: Cardiovascular;  Laterality: N/A;   TEE WITHOUT CARDIOVERSION N/A 05/14/2023   Procedure: TRANSESOPHAGEAL ECHOCARDIOGRAM;  Surgeon: Corliss Skains, MD;  Location: [facility] OR;  Service: Open Heart Surgery;  Laterality: N/A;   UMBILICAL HERNIA REPAIR  2003    Family History  Problem Relation Age of Onset   Depression Mother    Cancer Mother        Lung mets (unsure as to what kind)   Diabetes Mother    Hypertension Father    Depression Father    Heart attack Father    Depression Daughter    Anxiety disorder Daughter    Stroke Daughter    Transient ischemic attack Brother    Emphysema Maternal Grandfather    Brain cancer Paternal Grandmother     Heart attack Paternal Grandfather     SOCIAL HISTORY: Social History   Socioeconomic History   Marital status: Married    Spouse name: Stacy Frost   Number of children: 2   Years of education: Not on file   Highest education level: Some college, no degree  Occupational History   Occupation: Arts development officer  Tobacco Use   Smoking status: Never   Smokeless tobacco: Never  Vaping Use   Vaping status: Never Used  Substance and Sexual Activity   Alcohol use: No    Alcohol/week: 0.0 standard drinks of alcohol   Drug use: No   Sexual activity: Not on file  Other Topics Concern   Not on file  Social History Narrative   Married, lives with husband in a one level home. They recently got a puppy. She avoids caffeine, does not exercise.    Social Drivers of Corporate investment banker Strain: Not on file  Food Insecurity: No Food Insecurity (05/11/2023)   Hunger Vital Sign    Worried About Running Out of Food in the Last Year: Never true    Ran Out of Food in the Last Year: Never true  Transportation Needs: No Transportation Needs (05/11/2023)   PRAPARE - Administrator, Civil Service (Medical): No    Lack of Transportation (Non-Medical): No  Physical Activity: Not on file  Stress: No Stress Concern Present (11/27/2019)   Received from [facility], [facility] - Occupational Stress Questionnaire    Feeling of Stress : Not at all  Social Connections: Not on file  Intimate Partner Violence: Not At Risk (05/11/2023)   Humiliation, Afraid, Rape, and Kick questionnaire    Fear of Current or Ex-Partner: No    Emotionally Abused: No    Physically Abused: No    Sexually Abused: No    Allergies  Allergen Reactions   Amlodipine Besylate Shortness Of Breath   Morphine And Codeine Shortness Of Breath and Other (See Comments)    Chest pain   Amitriptyline Other (See Comments)    Unknown reaction   Butorphanol Tartrate Other (See  Comments)    REACTION: ED visit and got for migraine - not sure of response   Compazine [Prochlorperazine Edisylate] Other (See Comments)    Altered mental status   Rocephin [Ceftriaxone Sodium In Dextrose]     Given at GYN for infection - nauseated, dizziness (w/in last 7-8 years)   Sumatriptan Other (See Comments)    REACTION: HTN    Current Outpatient Medications  Medication Sig Dispense Refill   aspirin EC 325 MG tablet Take 1 tablet (325 mg total) by mouth daily.     atorvastatin (LIPITOR) 80 MG tablet TAKE 1 TABLET BY MOUTH ONCE DAILY 90 tablet 3   celecoxib (CELEBREX) 200 MG capsule Take 200 mg by  mouth 2 (two) times daily.     citalopram (CELEXA) 40 MG tablet Take 40 mg by mouth daily.     clonazePAM (KLONOPIN) 0.5 MG tablet Take 0.5 mg by mouth 2 (two) times daily as needed.     Compression Bandages KIT 1 each by Does not apply route daily as needed. 1 kit 0   Continuous Blood Gluc Receiver (DEXCOM G7 RECEIVER) DEVI USE 1 receiver continuously     Continuous Blood Gluc Sensor (DEXCOM G7 SENSOR) MISC APPLY 1 SENSOR EVERY 10 DAYS     cyclobenzaprine (FLEXERIL) 10 MG tablet Take 10 mg by mouth 3 (three) times daily.     ferrous sulfate 325 (65 FE) MG tablet Take 1 tablet (325 mg total) by mouth daily with breakfast. 60 tablet 1   furosemide (LASIX) 40 MG tablet Take 0.5 tablets (20 mg total) by mouth daily as needed (for swelling and weight gain). 30 tablet 5   gabapentin (NEURONTIN) 100 MG capsule Take 100 mg by mouth 3 (three) times daily.     insulin glargine-yfgn (SEMGLEE, YFGN,) 100 UNIT/ML Pen Inject 20 Units into the skin at bedtime. 18 mL 3   Insulin Pen Needle (PEN NEEDLES) 31G X 8 MM MISC Use to inject insulin once daily 100 each 3   metFORMIN (GLUCOPHAGE-XR) 500 MG 24 hr tablet TAKE 2 TABLETS BY MOUTH DAILY WITH BREAKFAST 180 tablet 1   metoprolol succinate (TOPROL-XL) 25 MG 24 hr tablet Take 1 tablet (25 mg total) by mouth 2 (two) times daily at 10 AM and 5 PM. Take with  or immediately following a meal.     MOUNJARO 7.5 MG/0.5ML Pen INJECT 7.5 MG UNDER THE SKIN WEEKLY 6 mL 1   potassium chloride SA (KLOR-CON M) 20 MEQ tablet TAKE 1 TABLET BY MOUTH DAILY 30 tablet 0   pramipexole (MIRAPEX) 0.5 MG tablet Take 0.5 mg by mouth at bedtime.     promethazine (PHENERGAN) 25 MG tablet Take 25 mg by mouth 2 (two) times daily as needed.     traZODone (DESYREL) 100 MG tablet Take 100 mg by mouth at bedtime.     No current facility-administered medications for this visit.    REVIEW OF SYSTEMS:  [X]  denotes positive finding, [ ]  denotes negative finding Cardiac  Comments:  Chest pain or chest pressure:    Shortness of breath upon exertion:    Short of breath when lying flat:    Irregular heart rhythm:        Vascular    Pain in calf, thigh, or hip brought on by ambulation:    Pain in feet at night that wakes you up from your sleep:     Blood clot in your veins:    Leg swelling:         Pulmonary    Oxygen at home:    Productive cough:     Wheezing:         Neurologic    Sudden weakness in arms or legs:     Sudden numbness in arms or legs:     Sudden onset of difficulty speaking or slurred speech:    Temporary loss of vision in one eye:     Problems with dizziness:         Gastrointestinal    Blood in stool:     Vomited blood:         Genitourinary    Burning when urinating:     Blood in urine:  Psychiatric    Major depression:         Hematologic    Bleeding problems:    Problems with blood clotting too easily:        Skin    Rashes or ulcers:        Constitutional    Fever or chills:      PHYSICAL EXAM: Vitals:   01/01/24 1209  BP: (!) 144/84  Pulse: 85  Resp: 18  SpO2: 100%  Weight: 192 lb 3.2 oz (87.2 kg)  Height: 5\' 6"  (1.676 m)    GENERAL: The patient is a well-nourished female, in no acute distress. The vital signs are documented above. CARDIAC: There is a regular rate and rhythm.  VASCULAR:  Bilateral femoral  pulses palpable Bilateral DP PT pulses palpable No lower extremity tissue loss PULMONARY: No respiratory distress. ABDOMEN: Soft and non-tender.  MUSCULOSKELETAL: There are no major deformities or cyanosis. NEUROLOGIC: No focal weakness or paresthesias are detected. SKIN: There are no ulcers or rashes noted. PSYCHIATRIC: The patient has a normal affect.  DATA:   ABIs 10/17/2023 were 1.18 on the right triphasic and 1.20 on the left triphasic and both within normal range.  Assessment/Plan:  63 y.o. female, with history of hypertension, hyperlipidemia, diabetes, back pain that presents for evaluation of leg weakness with abnormal TBI's.  I discussed that her ABIs are normal at the ankle greater than 1 with normal triphasic waveforms.  She has a normal vascular exam with palpable pedal pulses bilaterally.  I do not think she has any significant arterial insufficiency to explain her symptoms.  I think this is all related to her spinal stenosis and she is scheduled to undergo surgery with Dr. Lovell Sheehan in [place] with L2-L3, L3-L4 and L4-L5 PLIF later this month.  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  She can follow-up as needed.   Cephus Shelling, MD [facility] Office: [phone]

## 2024-01-04 ENCOUNTER — Ambulatory Visit (INDEPENDENT_AMBULATORY_CARE_PROVIDER_SITE_OTHER): Payer: 59 | Admitting: Nurse Practitioner

## 2024-01-04 ENCOUNTER — Encounter: Payer: Self-pay | Admitting: Nurse Practitioner

## 2024-01-04 VITALS — BP 110/70 | HR 77 | Ht 66.0 in | Wt 192.8 lb

## 2024-01-04 DIAGNOSIS — Z794 Long term (current) use of insulin: Secondary | ICD-10-CM

## 2024-01-04 DIAGNOSIS — Z7984 Long term (current) use of oral hypoglycemic drugs: Secondary | ICD-10-CM

## 2024-01-04 DIAGNOSIS — E1165 Type 2 diabetes mellitus with hyperglycemia: Secondary | ICD-10-CM

## 2024-01-04 DIAGNOSIS — Z7985 Long-term (current) use of injectable non-insulin antidiabetic drugs: Secondary | ICD-10-CM

## 2024-01-04 MED ORDER — INSULIN GLARGINE-YFGN 100 UNIT/ML ~~LOC~~ SOPN: 30.0000 [IU] | PEN_INJECTOR | Freq: Every day | SUBCUTANEOUS | 3 refills | Status: DC

## 2024-01-04 NOTE — Progress Notes (Signed)
 Endocrinology Follow Up Note       01/04/2024, 11:56 AM   Subjective:    Patient ID: Stacy Frost, female    DOB: 1961/05/15.  Stacy Frost is being seen in follow up after being seen in consultation for management of currently uncontrolled symptomatic diabetes requested by  Kirstie Peri, MD.   Past Medical History:  Diagnosis Date   Anxiety    Aortic stenosis    Mild to moderate   Bicuspid aortic valve    Essential hypertension, benign    GERD (gastroesophageal reflux disease)    History of cardiac catheterization    Normal coronaries 2008   Hyperlipidemia    Migraine headache    MVP (mitral valve prolapse)    Obesity    Pain in joint, ankle and foot    Chronic   Palpitations    Documented PACs by previous monitoring   Restless leg syndrome    Skin tag    Type 2 diabetes mellitus (HCC)    Unilateral primary osteoarthritis, right knee 09/06/2016    Past Surgical History:  Procedure Laterality Date   ACHILLES TENDON REPAIR  2003   Left   AORTIC VALVE REPLACEMENT N/A 05/14/2023   Procedure: AORTIC VALVE REPLACEMENT (AVR);  Surgeon: Corliss Skains, MD;  Location: [facility] OR;  Service: Open Heart Surgery;  Laterality: N/A;   CARDIAC CATHETERIZATION  2008   Normal coronary arteries; normal LV systolic function; mild dilation of aortic root; mild dilation of proximal ascending aorta; likely bicuspid aortic valve   CHOLECYSTECTOMY  2001   "Poor EF at 17%"   COLONOSCOPY N/A 07/17/2014   Procedure: COLONOSCOPY;  Surgeon: Malissa Hippo, MD;  Location: [facility];  Service: Endoscopy;  Laterality: N/A;  105   ESOPHAGEAL DILATION N/A 06/11/2015   Procedure: ESOPHAGEAL DILATION;  Surgeon: Malissa Hippo, MD;  Location: [facility] ORS;  Service: Endoscopy;  Laterality: N/AElease Hashimoto 54/56/58   ESOPHAGOGASTRODUODENOSCOPY N/A 07/17/2014   Procedure: ESOPHAGOGASTRODUODENOSCOPY (EGD);  Surgeon: Malissa Hippo, MD;  [facility]:  [facility];  Service: Endoscopy;  Laterality: N/A;   ESOPHAGOGASTRODUODENOSCOPY (EGD) WITH PROPOFOL N/A 06/11/2015   Procedure: ESOPHAGOGASTRODUODENOSCOPY (EGD) WITH PROPOFOL;  Surgeon: Malissa Hippo, MD;  Location: [facility];  Service: Endoscopy;  Laterality: N/A;  730   HERNIA REPAIR     umbilical    KNEE ARTHROSCOPY WITH MEDIAL MENISECTOMY Right 07/02/2018   Procedure: RIGHT KNEE ARTHROSCOPY, DEBRIDEMENT, MEDIAL MENISECTOMY;  Surgeon: [name], MD;  Location: [facility] OR;  Service: Orthopedics;  Laterality: Right;   KNEE SURGERY     MALONEY DILATION N/A 07/17/2014   Procedure: [name] DILATION;  Surgeon: Malissa Hippo, MD;  Location: [facility];  Service: Endoscopy;  Laterality: N/A;   RIGHT/LEFT HEART CATH AND CORONARY ANGIOGRAPHY N/A 05/02/2023   Procedure: RIGHT/LEFT HEART CATH AND CORONARY ANGIOGRAPHY;  Surgeon: Swazil[place], Peter M, MD;  Location: [facility] INVASIVE CV LAB;  Service: Cardiovascular;  Laterality: N/A;   TEE WITHOUT CARDIOVERSION N/A 05/14/2023   Procedure: TRANSESOPHAGEAL ECHOCARDIOGRAM;  Surgeon: Corliss Skains, MD;  Location: [facility] OR;  Service: Open Heart Surgery;  Laterality: N/A;   UMBILICAL HERNIA REPAIR  2003    Social  History   Socioeconomic History   Marital status: Married    Spouse name: Harvie Heck   Number of children: 2   Years of education: Not on file   Highest education level: Some college, no degree  Occupational History   Occupation: Arts development officer  Tobacco Use   Smoking status: Never   Smokeless tobacco: Never  Vaping Use   Vaping status: Never Used  Substance and Sexual Activity   Alcohol use: No    Alcohol/week: 0.0 standard drinks of alcohol   Drug use: No   Sexual activity: Not on file  Other Topics Concern   Not on file  Social History Narrative   Married, lives with husband in a one level home. They recently got a puppy. She avoids caffeine, does not exercise.    Social Drivers of Corporate investment banker Strain: Not on file  Food  Insecurity: No Food Insecurity (05/11/2023)   Hunger Vital Sign    Worried About Running Out of Food in the Last Year: Never true    Ran Out of Food in the Last Year: Never true  Transportation Needs: No Transportation Needs (05/11/2023)   PRAPARE - Administrator, Civil Service (Medical): No    Lack of Transportation (Non-Medical): No  Physical Activity: Not on file  Stress: No Stress Concern Present (11/27/2019)   Received from [facility], [facility] of Occupational Health - Occupational Stress Questionnaire    Feeling of Stress : Not at all  Social Connections: Not on file    Family History  Problem Relation Age of Onset   Depression Mother    Cancer Mother        Lung mets (unsure as to what kind)   Diabetes Mother    Hypertension Father    Depression Father    Heart attack Father    Depression Daughter    Anxiety disorder Daughter    Stroke Daughter    Transient ischemic attack Brother    Emphysema Maternal Grandfather    Brain cancer Paternal Grandmother    Heart attack Paternal Grandfather     Outpatient Encounter Medications as of 01/04/2024  Medication Sig   aspirin EC 325 MG tablet Take 1 tablet (325 mg total) by mouth daily.   atorvastatin (LIPITOR) 80 MG tablet TAKE 1 TABLET BY MOUTH ONCE DAILY   celecoxib (CELEBREX) 200 MG capsule Take 200 mg by mouth 2 (two) times daily.   citalopram (CELEXA) 40 MG tablet Take 40 mg by mouth daily.   clonazePAM (KLONOPIN) 0.5 MG tablet Take 0.5 mg by mouth 2 (two) times daily as needed.   Compression Bandages KIT 1 each by Does not apply route daily as needed.   Continuous Blood Gluc Receiver (DEXCOM G7 RECEIVER) DEVI USE 1 receiver continuously   Continuous Blood Gluc Sensor (DEXCOM G7 SENSOR) MISC APPLY 1 SENSOR EVERY 10 DAYS   cyclobenzaprine (FLEXERIL) 10 MG tablet Take 10 mg by mouth 3 (three) times daily.   ferrous sulfate 325 (65 FE) MG tablet Take 1 tablet (325 mg total) by mouth  daily with breakfast.   furosemide (LASIX) 40 MG tablet Take 0.5 tablets (20 mg total) by mouth daily as needed (for swelling and weight gain).   gabapentin (NEURONTIN) 100 MG capsule Take 100 mg by mouth 3 (three) times daily.   Insulin Pen Needle (PEN NEEDLES) 31G X 8 MM MISC Use to inject insulin once daily   metFORMIN (GLUCOPHAGE-XR) 500  MG 24 hr tablet TAKE 2 TABLETS BY MOUTH DAILY WITH BREAKFAST   metoprolol succinate (TOPROL-XL) 25 MG 24 hr tablet Take 1 tablet (25 mg total) by mouth 2 (two) times daily at 10 AM and 5 PM. Take with or immediately following a meal.   MOUNJARO 7.5 MG/0.5ML Pen INJECT 7.5 MG UNDER THE SKIN WEEKLY   potassium chloride SA (KLOR-CON M) 20 MEQ tablet TAKE 1 TABLET BY MOUTH DAILY   pramipexole (MIRAPEX) 0.5 MG tablet Take 0.5 mg by mouth at bedtime.   promethazine (PHENERGAN) 25 MG tablet Take 25 mg by mouth 2 (two) times daily as needed.   traZODone (DESYREL) 100 MG tablet Take 100 mg by mouth at bedtime.   [DISCONTINUED] insulin glargine-yfgn (SEMGLEE, YFGN,) 100 UNIT/ML Pen Inject 20 Units into the skin at bedtime.   insulin glargine-yfgn (SEMGLEE, YFGN,) 100 UNIT/ML Pen Inject 30 Units into the skin at bedtime.   No facility-administered encounter medications on file as of 01/04/2024.    ALLERGIES: Allergies  Allergen Reactions   Amlodipine Besylate Shortness Of Breath   Morphine And Codeine Shortness Of Breath and Other (See Comments)    Chest pain   Amitriptyline Other (See Comments)    Unknown reaction   Butorphanol Tartrate Other (See Comments)    REACTION: ED visit and got for migraine - not sure of response   Compazine [Prochlorperazine Edisylate] Other (See Comments)    Altered mental status   Rocephin [Ceftriaxone Sodium In Dextrose]     Given at GYN for infection - nauseated, dizziness (w/in last 7-8 years)   Sumatriptan Other (See Comments)    REACTION: HTN    VACCINATION STATUS: Immunization History  Administered Date(s) Administered    H1N1 08/20/2008   Influenza Whole 08/20/2008   Pneumococcal Polysaccharide-23 08/20/2008   Td 05/30/1996    Diabetes She presents for her follow-up diabetic visit. She has type 2 diabetes mellitus. Onset time: Diagnosed at approx age of 67. Her disease course has been improving. There are no hypoglycemic associated symptoms. Pertinent negatives for diabetes include no weight loss. There are no hypoglycemic complications. Symptoms are stable. There are no diabetic complications. Risk factors for coronary artery disease include diabetes mellitus, dyslipidemia, family history, obesity, hypertension, sedentary lifestyle and post-menopausal. Current diabetic treatment includes oral agent (monotherapy) (and Mounjaro). She is compliant with treatment most of the time. Her weight is fluctuating minimally. She is following a generally healthy diet. When asked about meal planning, she reported none. She has not had a previous visit with a dietitian. She rarely participates in exercise. Her home blood glucose trend is decreasing steadily. Her overall blood glucose range is >200 mg/dl. (She presents today with her CGM showing above target glycemic profile overall (however improving some since previous couple of weeks).  Her POCT A1c today is 10.6%, increasing from last visit of 9.7%.  Analysis of her CGM shows TIR 10%, TAR 90%, TBR 0% with a GMI of 9%.  She notes she did just get over COVID a few weeks ago.  She notes she is also planning on having back surgery at the end of the month.) An ACE inhibitor/angiotensin II receptor blocker is being taken. She does not see a podiatrist.Eye exam is current.    Review of systems  Constitutional: + stable body weight,  current Body mass index is 31.12 kg/m. , no fatigue, no subjective hyperthermia, no subjective hypothermia Eyes: no blurry vision, no xerophthalmia ENT: no sore throat, no nodules palpated in throat, no dysphagia/odynophagia,  no  hoarseness Cardiovascular: no chest pain, no shortness of breath, no palpitations, no leg swelling Respiratory: no cough, no shortness of breath Gastrointestinal: no nausea/vomiting/diarrhea, + intermittent constipation Musculoskeletal: no muscle/joint aches Skin: no rashes, no hyperemia Neurological: no tremors, no numbness, no tingling, no dizziness Psychiatric: no depression, no anxiety  Objective:     BP 110/70 (BP Location: Left Arm, Patient Position: Sitting, Cuff Size: Large)   Pulse 77   Ht 5\' 6"  (1.676 m)   Wt 192 lb 12.8 oz (87.5 kg)   BMI 31.12 kg/m   Wt Readings from Last 3 Encounters:  01/04/24 192 lb 12.8 oz (87.5 kg)  01/01/24 192 lb 3.2 oz (87.2 kg)  12/03/23 195 lb 12.8 oz (88.8 kg)     BP Readings from Last 3 Encounters:  01/04/24 110/70  01/01/24 (!) 144/84  12/03/23 120/72      Physical Exam- Limited  Constitutional:  Body mass index is 31.12 kg/m. , not in acute distress, normal state of mind Eyes:  EOMI, no exophthalmos Musculoskeletal: no gross deformities, strength intact in all four extremities, no gross restriction of joint movements Skin:  no rashes, no hyperemia Neurological: no tremor with outstretched hands   Diabetic Foot Exam - Simple   No data filed     CMP ( most recent) CMP     Component Value Date/Time   NA 133 (L) 05/21/2023 0203   K 4.8 05/21/2023 0203   CL 95 (L) 05/21/2023 0203   CO2 27 05/21/2023 0203   GLUCOSE 159 (H) 05/21/2023 0203   BUN 9 05/21/2023 0203   CREATININE 0.80 05/21/2023 0203   CALCIUM 9.1 05/21/2023 0203   PROT 7.5 12/03/2023 1006   ALBUMIN 3.7 12/03/2023 1006   AST 23 12/03/2023 1006   ALT 22 12/03/2023 1006   ALKPHOS 82 12/03/2023 1006   BILITOT 1.1 12/03/2023 1006   GFRNONAA >60 05/21/2023 0203   GFRAA >60 06/27/2018 0929     Diabetic Labs (most recent): Lab Results  Component Value Date   HGBA1C 9.7 (A) 10/03/2023   HGBA1C 7.4 (H) 05/14/2023   HGBA1C 7.4 (H) 05/13/2023   MICROALBUR  30 mg/L 08/29/2022     Lipid Panel ( most recent) Lipid Panel     Component Value Date/Time   CHOL 165 06/29/2008 0842   TRIG 116 12/03/2023 1006   HDL 50 12/03/2023 1006   LDLCALC 82 06/29/2008 0842      No results found for: "TSH", "FREET4"         Assessment & Plan:   1) Type 2 diabetes mellitus with hyperglycemia, with long-term current use of insulin (HCC)  She presents today with her CGM showing above target glycemic profile overall (however improving some since previous couple of weeks).  Her POCT A1c today is 10.6%, increasing from last visit of 9.7%.  Analysis of her CGM shows TIR 10%, TAR 90%, TBR 0% with a GMI of 9%.  She notes she did just get over COVID a few weeks ago.  She notes she is also planning on having back surgery at the end of the month.  - Stacy Frost has currently uncontrolled symptomatic type 2 DM since 63 years of age.   -Recent labs reviewed.  - I had a long discussion with her about the progressive nature of diabetes and the pathology behind its complications. -her diabetes is not currently complicated but she remains at a high risk for more acute and chronic complications which include CAD, CVA, CKD, retinopathy,  and neuropathy. These are all discussed in detail with her.  The following Lifestyle Medicine recommendations according to American College of Lifestyle Medicine [facility]) were discussed and offered to patient and she agrees to start the journey:  A. Whole Foods, Plant-based plate comprising of fruits and vegetables, plant-based proteins, whole-grain carbohydrates was discussed in detail with the patient.   A list for source of those nutrients were also provided to the patient.  Patient will use only water or unsweetened tea for hydration. B.  The need to stay away from risky substances including alcohol, smoking; obtaining 7 to 9 hours of restorative sleep, at least 150 minutes of moderate intensity exercise weekly, the importance of healthy  social connections,  and stress reduction techniques were discussed. C.  A full color page of  Calorie density of various food groups per pound showing examples of each food groups was provided to the patient.  - Nutritional counseling repeated at each appointment due to patients tendency to fall back in to old habits.  - The patient admits there is a room for improvement in their diet and drink choices. -  Suggestion is made for the patient to avoid simple carbohydrates from their diet including Cakes, Sweet Desserts / Pastries, Ice Cream, Soda (diet and regular), Sweet Tea, Candies, Chips, Cookies, Sweet Pastries, Store Bought Juices, Alcohol in Excess of 1-2 drinks a day, Artificial Sweeteners, Coffee Creamer, and "Sugar-free" Products. This will help patient to have stable blood glucose profile and potentially avoid unintended weight gain.   - I encouraged the patient to switch to unprocessed or minimally processed complex starch and increased protein intake (animal or plant source), fruits, and vegetables.   - Patient is advised to stick to a routine mealtimes to eat 3 meals a day and avoid unnecessary snacks (to snack only to correct hypoglycemia).  - I have approached her with the following individualized plan to manage her diabetes and patient agrees:   -She is advised to continue Mounjaro 7.5 mg SQ weekly to and continue her Metformin 1000 mg ER PO daily with breakfast, and increase her Semglee to 30 units SQ nightly.  -she is encouraged to continue monitoring glucose 4 times daily (using her CGM), before meals and before bed, and to call the clinic if she has readings less than 70 or above 200 for 3 tests in a row.   - Adjustment parameters are given to her for hypo and hyperglycemia in writing.  - her Marcelline Deist was discontinued, due to polyuria and increased risk of UTI and yeast infections.  I also stopped her Novolog to prevent hypoglycemia.  - Specific targets for  A1c; LDL, HDL,  and Triglycerides were discussed with the patient.  2) Blood Pressure /Hypertension:  her blood pressure is controlled to target.   she is advised to continue her current medications including Metoprolol 200 mg p.o. daily with breakfast, Lisinopril 20 mg po daily, Lasix 40 mg po daily.  3) Lipids/Hyperlipidemia:    Recent lipid panel from 09/25/23 shows controlled LDL of 45.  she is advised to continue Simvastatin 40 mg daily at bedtime.  Side effects and precautions discussed with her.    4)  Weight/Diet:  her Body mass index is 31.12 kg/m.  -  clearly complicating her diabetes care.   she is a candidate for weight loss. I discussed with her the fact that loss of 5 - 10% of her  current body weight will have the most impact on her diabetes management.  Exercise, and detailed carbohydrates information provided  -  detailed on discharge instructions.  5) Chronic Care/Health Maintenance: -she is on ACEI/ARB and Statin medications and is encouraged to initiate and continue to follow up with Ophthalmology, Dentist, Podiatrist at least yearly or according to recommendations, and advised to stay away from smoking. I have recommended yearly flu vaccine and pneumonia vaccine at least every 5 years; moderate intensity exercise for up to 150 minutes weekly; and sleep for at least 7 hours a day.  - she is advised to maintain close follow up with Kirstie Peri, MD for primary care needs, as well as her other providers for optimal and coordinated care.     I spent  35  minutes in the care of the patient today including review of labs from CMP, Lipids, Thyroid Function, Hematology (current and previous including abstractions from other facilities); face-to-face time discussing  her blood glucose readings/logs, discussing hypoglycemia and hyperglycemia episodes and symptoms, medications doses, her options of short and long term treatment based on the latest standards of care / guidelines;  discussion about  incorporating lifestyle medicine;  and documenting the encounter. Risk reduction counseling performed per USPSTF guidelines to reduce obesity and cardiovascular risk factors.     Please refer to Patient Instructions for Blood Glucose Monitoring and Insulin/Medications Dosing Guide"  in media tab for additional information. Please  also refer to " Patient Self Inventory" in the Media  tab for reviewed elements of pertinent patient history.  Stacy Frost participated in the discussions, expressed understanding, and voiced agreement with the above plans.  All questions were answered to her satisfaction. she is encouraged to contact clinic should she have any questions or concerns prior to her return visit.     Follow up plan: - Return in about 3 months (around 04/05/2024) for Diabetes F/U- A1c and UM in office, No previsit labs, Bring meter and logs.   Ronny Bacon, [facility] [address] [place], [place] [address] Phone: [phone] Fax: [phone]  01/04/2024, 11:56 AM

## 2024-01-09 ENCOUNTER — Other Ambulatory Visit: Payer: Self-pay | Admitting: Neurosurgery

## 2024-01-16 ENCOUNTER — Encounter (HOSPITAL_COMMUNITY): Payer: 59

## 2024-01-16 NOTE — Pre-Procedure Instructions (Signed)
 Surgical Instructions   Your procedure is scheduled on January 28, 2024. Report to [facility] Main Entrance "A" at 7:30 A.M., then check in with the Admitting office. Any questions or running late day of surgery: call [phone]  Questions prior to your surgery date: call [phone], Monday-Friday, 8am-4pm. If you experience any cold or flu symptoms such as cough, fever, chills, shortness of breath, etc. between now and your scheduled surgery, please notify us at the above number.     Remember:  Do not eat or drink after midnight the night before your surgery    Take these medicines the morning of surgery with A SIP OF WATER: atorvastatin (LIPITOR)  citalopram (CELEXA)  cyclobenzaprine (FLEXERIL)  gabapentin (NEURONTIN)  metoprolol succinate (TOPROL-XL)    May take these medicines IF NEEDED: clonazePAM (KLONOPIN)  promethazine (PHENERGAN)    STOP taking your ASPIRIN five days prior to surgery. Your last dose will be March 25th.   One week prior to surgery, STOP taking any Aleve, Naproxen, Ibuprofen, Motrin, Advil, Goody's, BC's, all herbal medications, fish oil, and non-prescription vitamins. This includes your medication: celecoxib (CELEBREX)    WHAT DO I DO ABOUT MY DIABETES MEDICATION?   Do not take metFORMIN (GLUCOPHAGE-XR) the morning of surgery.  THE NIGHT BEFORE SURGERY, take 20 units of insulin glargine (SEMGLEE).      STOP taking your [facility] one week prior to surgery. DO NOT take any doses after March 23rd.   HOW TO MANAGE YOUR DIABETES BEFORE AND AFTER SURGERY  Why is it important to control my blood sugar before and after surgery? Improving blood sugar levels before and after surgery helps healing and can limit problems. A way of improving blood sugar control is eating a healthy diet by:  Eating less sugar and carbohydrates  Increasing activity/exercise  Talking with your doctor about reaching your blood sugar goals High blood sugars (greater than  180 mg/dL) can raise your risk of infections and slow your recovery, so you will need to focus on controlling your diabetes during the weeks before surgery. Make sure that the doctor who takes care of your diabetes knows about your planned surgery including the date and location.  How do I manage my blood sugar before surgery? Check your blood sugar at least 4 times a day, starting 2 days before surgery, to make sure that the level is not too high or low.  Check your blood sugar the morning of your surgery when you wake up and every 2 hours until you get to the Short Stay unit.  If your blood sugar is less than 70 mg/dL, you will need to treat for low blood sugar: Do not take insulin. Treat a low blood sugar (less than 70 mg/dL) with  cup of clear juice (cranberry or apple), 4 glucose tablets, OR glucose gel. Recheck blood sugar in 15 minutes after treatment (to make sure it is greater than 70 mg/dL). If your blood sugar is not greater than 70 mg/dL on recheck, call [phone] for further instructions. Report your blood sugar to the short stay nurse when you get to Short Stay.  If you are admitted to the hospital after surgery: Your blood sugar will be checked by the staff and you will probably be given insulin after surgery (instead of oral diabetes medicines) to make sure you have good blood sugar levels. The goal for blood sugar control after surgery is 80-180 mg/dL.  Do NOT Smoke (Tobacco/Vaping) for 24 hours prior to your procedure.  If you use a CPAP at night, you may bring your mask/headgear for your overnight stay.   You will be asked to remove any contacts, glasses, piercing's, hearing aid's, dentures/partials prior to surgery. Please bring cases for these items if needed.    Patients discharged the day of surgery will not be allowed to drive home, and someone needs to stay with them for 24 hours.  SURGICAL WAITING ROOM VISITATION Patients may have no more  than 2 support people in the waiting area - these visitors may rotate.   Pre-op nurse will coordinate an appropriate time for 1 ADULT support person, who may not rotate, to accompany patient in pre-op.  Children under the age of 52 must have an adult with them who is not the patient and must remain in the main waiting area with an adult.  If the patient needs to stay at the hospital during part of their recovery, the visitor guidelines for inpatient rooms apply.  Please refer to the [facility] website for the visitor guidelines for any additional information.   If you received a COVID test during your pre-op visit  it is requested that you wear a mask when out in public, stay away from anyone that may not be feeling well and notify your surgeon if you develop symptoms. If you have been in contact with anyone that has tested positive in the last 10 days please notify you surgeon.      Pre-operative 5 CHG Bathing Instructions   You can play a key role in reducing the risk of infection after surgery. Your skin needs to be as free of germs as possible. You can reduce the number of germs on your skin by washing with CHG (chlorhexidine gluconate) soap before surgery. CHG is an antiseptic soap that kills germs and continues to kill germs even after washing.   DO NOT use if you have an allergy to chlorhexidine/CHG or antibacterial soaps. If your skin becomes reddened or irritated, stop using the CHG and notify one of our RNs at [phone].   Please shower with the CHG soap starting 4 days before surgery using the following schedule:     Please keep in mind the following:  DO NOT shave, including legs and underarms, starting the day of your first shower.   You may shave your face at any point before/day of surgery.  Place clean sheets on your bed the day you start using CHG soap. Use a clean washcloth (not used since being washed) for each shower. DO NOT sleep with pets once you start using  the CHG.   CHG Shower Instructions:  Wash your face and private area with normal soap. If you choose to wash your hair, wash first with your normal shampoo.  After you use shampoo/soap, rinse your hair and body thoroughly to remove shampoo/soap residue.  Turn the water OFF and apply about 3 tablespoons (45 ml) of CHG soap to a CLEAN washcloth.  Apply CHG soap ONLY FROM YOUR NECK DOWN TO YOUR TOES (washing for 3-5 minutes)  DO NOT use CHG soap on face, private areas, open wounds, or sores.  Pay special attention to the area where your surgery is being performed.  If you are having back surgery, having someone wash your back for you may be helpful. Wait 2 minutes after CHG soap is applied, then you may rinse off the CHG soap.  Pat dry with a  clean towel  Put on clean clothes/pajamas   If you choose to wear lotion, please use ONLY the CHG-compatible lotions that are listed below.  Additional instructions for the day of surgery: DO NOT APPLY any lotions, deodorants, cologne, or perfumes.   Do not bring valuables to the hospital. [facility] is not responsible for any belongings/valuables. Do not wear nail polish, gel polish, artificial nails, or any other type of covering on natural nails (fingers and toes) Do not wear jewelry or makeup Put on clean/comfortable clothes.  Please brush your teeth.  Ask your nurse before applying any prescription medications to the skin.     CHG Compatible Lotions   Aveeno Moisturizing lotion  Cetaphil Moisturizing Cream  Cetaphil Moisturizing Lotion  Clairol Herbal Essence Moisturizing Lotion, Dry Skin  Clairol Herbal Essence Moisturizing Lotion, Extra Dry Skin  Clairol Herbal Essence Moisturizing Lotion, Normal Skin  Curel Age Defying Therapeutic Moisturizing Lotion with Alpha Hydroxy  Curel Extreme Care Body Lotion  Curel Soothing Hands Moisturizing Hand Lotion  Curel Therapeutic Moisturizing Cream, Fragrance-Free  Curel Therapeutic Moisturizing  Lotion, Fragrance-Free  Curel Therapeutic Moisturizing Lotion, Original Formula  Eucerin Daily Replenishing Lotion  Eucerin Dry Skin Therapy Plus Alpha Hydroxy Crme  Eucerin Dry Skin Therapy Plus Alpha Hydroxy Lotion  Eucerin Original Crme  Eucerin Original Lotion  Eucerin Plus Crme Eucerin Plus Lotion  Eucerin TriLipid Replenishing Lotion  Keri Anti-Bacterial Hand Lotion  Keri Deep Conditioning Original Lotion Dry Skin Formula Softly Scented  Keri Deep Conditioning Original Lotion, Fragrance Free Sensitive Skin Formula  Keri Lotion Fast Absorbing Fragrance Free Sensitive Skin Formula  Keri Lotion Fast Absorbing Softly Scented Dry Skin Formula  Keri Original Lotion  Keri Skin Renewal Lotion Keri Silky Smooth Lotion  Keri Silky Smooth Sensitive Skin Lotion  Nivea Body Creamy Conditioning Oil  Nivea Body Extra Enriched Lotion  Nivea Body Original Lotion  Nivea Body Sheer Moisturizing Lotion Nivea Crme  Nivea Skin Firming Lotion  NutraDerm 30 Skin Lotion  NutraDerm Skin Lotion  NutraDerm Therapeutic Skin Cream  NutraDerm Therapeutic Skin Lotion  ProShield Protective Hand Cream  Provon moisturizing lotion  Please read over the following fact sheets that you were given.

## 2024-01-17 ENCOUNTER — Encounter: Payer: Self-pay | Admitting: Nurse Practitioner

## 2024-01-17 ENCOUNTER — Other Ambulatory Visit: Payer: Self-pay

## 2024-01-17 ENCOUNTER — Encounter (HOSPITAL_COMMUNITY): Payer: Self-pay

## 2024-01-17 ENCOUNTER — Encounter (HOSPITAL_COMMUNITY)
Admission: RE | Admit: 2024-01-17 | Discharge: 2024-01-17 | Disposition: A | Discharge status: Home / Self Care | Source: Ambulatory Visit | Attending: Neurosurgery | Admitting: Neurosurgery

## 2024-01-17 VITALS — BP 132/58 | HR 80 | Temp 98.7°F | Resp 18 | Ht 66.0 in | Wt 195.0 lb

## 2024-01-17 DIAGNOSIS — Z7984 Long term (current) use of oral hypoglycemic drugs: Secondary | ICD-10-CM | POA: Insufficient documentation

## 2024-01-17 DIAGNOSIS — Z01818 Encounter for other preprocedural examination: Secondary | ICD-10-CM

## 2024-01-17 DIAGNOSIS — Z794 Long term (current) use of insulin: Secondary | ICD-10-CM | POA: Diagnosis not present

## 2024-01-17 DIAGNOSIS — Z01812 Encounter for preprocedural laboratory examination: Secondary | ICD-10-CM | POA: Insufficient documentation

## 2024-01-17 DIAGNOSIS — E119 Type 2 diabetes mellitus without complications: Secondary | ICD-10-CM | POA: Insufficient documentation

## 2024-01-17 LAB — TYPE AND SCREEN
ABO/RH(D): A NEG
Antibody Screen: NEGATIVE

## 2024-01-17 LAB — SURGICAL PCR SCREEN
MRSA, PCR: NEGATIVE
Staphylococcus aureus: POSITIVE — AB

## 2024-01-17 LAB — CBC
HCT: 39.3 % (ref 36.0–46.0)
Hemoglobin: 13.2 g/dL (ref 12.0–15.0)
MCH: 28.9 pg (ref 26.0–34.0)
MCHC: 33.6 g/dL (ref 30.0–36.0)
MCV: 86.2 fL (ref 80.0–100.0)
Platelets: 242 10*3/uL (ref 150–400)
RBC: 4.56 MIL/uL (ref 3.87–5.11)
RDW: 12.3 % (ref 11.5–15.5)
WBC: 7.1 10*3/uL (ref 4.0–10.5)
nRBC: 0 % (ref 0.0–0.2)

## 2024-01-17 LAB — BASIC METABOLIC PANEL
Anion gap: 10 (ref 5–15)
BUN: 18 mg/dL (ref 8–23)
CO2: 29 mmol/L (ref 22–32)
Calcium: 9.8 mg/dL (ref 8.9–10.3)
Chloride: 100 mmol/L (ref 98–111)
Creatinine, Ser: 0.99 mg/dL (ref 0.44–1.00)
GFR, Estimated: >60 mL/min (ref >60)
Glucose, Bld: 116 mg/dL — ABNORMAL HIGH (ref 70–99)
Potassium: 4.7 mmol/L (ref 3.5–5.1)
Sodium: 139 mmol/L (ref 135–145)

## 2024-01-17 LAB — GLUCOSE, CAPILLARY: Glucose-Capillary: 134 mg/dL — ABNORMAL HIGH (ref 70–99)

## 2024-01-17 NOTE — Progress Notes (Signed)
 PCP - Dr. Kirstie Peri Cardiologist - Dr. Dietrich Pates - Last office visit 12/03/2023 Endocrinologist - Ronny Bacon, NP - Last office visit 01/04/2024  PPM/ICD - Denies Device Orders - n/a Rep Notified - n/a  Chest x-ray - 06/21/2023 EKG - 05/15/2023 Stress Test - 11/26/2017 ECHO - 09/07/2023 Cardiac Cath - 05/02/2023  Sleep Study - Denies CPAP - n/a  Pt is DM2. She has a Dexcom7 on her right arm and checks blood sugars multiple times per day. Normal fasting range is 200-300. CBG at pre-op 134. Last A1c 10.6 on 01/04/24. At recent visit, NP increased Semglee from 30 to 40 units at night. Discussed with Antionette Poles, PA-C who will reach out to Dr. Lovell Sheehan office and make them aware of increased A1c. Pt educated to continue to be active as able and decrease sugar/carbs. She has already reached out to her endocrinologist this morning regarding the high fasting blood sugars.  Last dose of GLP1 agonist- Last dose of Mounjaro 3/19 GLP1 instructions: Pt instructed to NOT take any doses after March 23rd.  Blood Thinner Instructions: n/a Aspirin Instructions: Per surgeon instructions, pt will stop ASA 5 days prior to surgery. Last dose will be March 25th.  NPO after midnight  COVID TEST- n/a    Anesthesia review: Yes. Cardiac hx including HTN, recent AVR July 2024 and uncontrolled DM2.  Patient denies shortness of breath, fever, cough and chest pain at PAT appointment. Pt did test positive for COVID mid February 2024. She has had symptom resolution since February 23rd. No other respiratory illness/infection noted in the last two months.    All instructions explained to the patient, with a verbal understanding of the material. Patient agrees to go over the instructions while at home for a better understanding. Patient also instructed to self quarantine after being tested for COVID-19. The opportunity to ask questions was provided.

## 2024-01-22 NOTE — Anesthesia Preprocedure Evaluation (Addendum)
 Anesthesia Evaluation  Patient identified by MRN, date of birth, ID band Patient awake    Reviewed: Allergy & Precautions, NPO status , Patient's Chart, lab work & pertinent test results  Airway Mallampati: III  TM Distance: >3 FB Neck ROM: Full    Dental no notable dental hx.    Pulmonary neg pulmonary ROS   Pulmonary exam normal        Cardiovascular hypertension, Pt. on home beta blockers + Peripheral Vascular Disease  Normal cardiovascular exam+ Valvular Problems/Murmurs (s/p AVR) MVP   Bicuspid aortic valve  ECHO:  1. Left ventricular ejection fraction, by estimation, is 65 to 70%. The  left ventricle has normal function. The left ventricle has no regional  wall motion abnormalities. There is mild left ventricular hypertrophy.   2. Right ventricular systolic function is normal. The right ventricular  size is normal.   3. 25mm Inspiris Valve is in the AV position with normal function. . The  aortic valve has been repaired/replaced. Aortic valve regurgitation is not  visualized. No aortic stenosis is present.   4. IVC is small suggesting low RA pressure and hypovolemia.   5. Limited echo evaluate aortic valve function     Neuro/Psych  Headaches  Anxiety        GI/Hepatic negative GI ROS, Neg liver ROS,,,  Endo/Other  diabetes, Oral Hypoglycemic Agents, Insulin Dependent  Patient on GLP-1 Agonist  Renal/GU negative Renal ROS     Musculoskeletal  (+) Arthritis ,    Abdominal   Peds  Hematology negative hematology ROS (+)   Anesthesia Other Findings   Reproductive/Obstetrics                             Anesthesia Physical Anesthesia Plan  ASA: 3  Anesthesia Plan: General   Post-op Pain Management:    Induction: Intravenous  PONV Risk Score and Plan: 3 and Ondansetron, Dexamethasone, Midazolam and Treatment may vary due to age or medical condition  Airway Management Planned:  Oral ETT  Additional Equipment: ClearSight  Intra-op Plan:   Post-operative Plan: Extubation in OR  Informed Consent: I have reviewed the patients History and Physical, chart, labs and discussed the procedure including the risks, benefits and alternatives for the proposed anesthesia with the patient or authorized representative who has indicated his/her understanding and acceptance.     Dental advisory given  Plan Discussed with: CRNA  Anesthesia Plan Comments: (PAT note by Antionette Poles, PA-C: 63 year old female follows with cardiology for history of severe AS s/p bioprosthetic AVR 03/2023.  Presurgical cath showed normal coronaries.  Follow-up echo 08/2023 showed normal LV function, AV prosthesis functioning well.  Last seen in follow-up by Dr. Tenny Craw on 12/03/2023, noted to be feeling well at that time, no changes to management.  She was recommended to follow-up in 6 months.  Follows with endocrinology for history of uncontrolled IDDM 2.  A1c 10.6 on 01/04/2024.  At that time, endocrinology titrated her Semglee from 30 to 40 units nightly.  Patient continued to have elevated glucose readings; she reported fasting range to be 200-300 at preop testing appointment.  She reached back out to her endocrinology provider and her Semglee was again titrated up to 45 units nightly.  Dr. Lovell Sheehan was notified of poor glycemic control and his office reached out to Ronny Bacon, NP for endocrinology input.  Per clearance dated 01/21/2024, "although A1c not at goal we made adjustments to her regimen and glucose is  much better.  See attached glucose readings.  Now on Semglee 45 units nightly, metformin 1000 mg ER daily 1000 mg ER daily, Mounjaro 7.5 mg weekly."  Attached readings indicate recent average glucose of 179 mg/dL.  Patient reports last dose of Mounjaro 01/16/2024.  Preop labs reviewed, unremarkable.  EKG 05/15/2023: Sinus tachycardia.  Rate 107. Nonspecific T wave abnormality  TTE 09/07/2023: 1. Left  ventricular ejection fraction, by estimation, is 65 to 70%. The  left ventricle has normal function. The left ventricle has no regional  wall motion abnormalities. There is mild left ventricular hypertrophy.  2. Right ventricular systolic function is normal. The right ventricular  size is normal.  3. 25mm Inspiris Valve is in the AV position with normal function. . The  aortic valve has been repaired/replaced. Aortic valve regurgitation is not  visualized. No aortic stenosis is present.  4. IVC is small suggesting low RA pressure and hypovolemia.  5. Limited echo evaluate aortic valve function   Cath 05/02/2023: 1. Normal coronary anatomy 2. Normal LV filling pressure. PCWP 19/22 mean 13 mm Hg 3. Normal right heart pressures. PAP 31/13 with mean 22 mm Hg 4. Normal cardiac output 5.54 L/min, index 2.90  Plan: valve team assessment for AVR   )        Anesthesia Quick Evaluation

## 2024-01-22 NOTE — Progress Notes (Addendum)
 Anesthesia Chart Review:  63 year old female follows with cardiology for history of severe AS s/p bioprosthetic AVR 03/2023.  Presurgical cath showed normal coronaries.  Follow-up echo 08/2023 showed normal LV function, AV prosthesis functioning well.  Last seen in follow-up by Dr. Tenny Craw on 12/03/2023, noted to be feeling well at that time, no changes to management.  She was recommended to follow-up in 6 months.  Follows with endocrinology for history of uncontrolled IDDM 2.  A1c 10.6 on 01/04/2024.  At that time, endocrinology titrated her Semglee from 30 to 40 units nightly.  Patient continued to have elevated glucose readings; she reported fasting range to be 200-300 at preop testing appointment.  She reached back out to her endocrinology provider and her Semglee was again titrated up to 45 units nightly.  Dr. Lovell Sheehan was notified of poor glycemic control and his office reached out to Ronny Bacon, NP for endocrinology input.  Per clearance dated 01/21/2024, "although A1c not at goal we made adjustments to her regimen and glucose is much better.  See attached glucose readings.  Now on Semglee 45 units nightly, metformin 1000 mg ER daily 1000 mg ER daily, Mounjaro 7.5 mg weekly."  Attached readings indicate recent average glucose of 179 mg/dL.  Patient reports last dose of Mounjaro 01/16/2024.  Preop labs reviewed, unremarkable.  EKG 05/15/2023: Sinus tachycardia.  Rate 107. Nonspecific T wave abnormality  TTE 09/07/2023: 1. Left ventricular ejection fraction, by estimation, is 65 to 70%. The  left ventricle has normal function. The left ventricle has no regional  wall motion abnormalities. There is mild left ventricular hypertrophy.   2. Right ventricular systolic function is normal. The right ventricular  size is normal.   3. 25mm Inspiris Valve is in the AV position with normal function. . The  aortic valve has been repaired/replaced. Aortic valve regurgitation is not  visualized. No aortic  stenosis is present.   4. IVC is small suggesting low RA pressure and hypovolemia.   5. Limited echo evaluate aortic valve function   Cath 05/02/2023: Normal coronary anatomy Normal LV filling pressure. PCWP 19/22 mean 13 mm Hg Normal right heart pressures. PAP 31/13 with mean 22 mm Hg Normal cardiac output 5.54 L/min, index 2.90   Plan: valve team assessment for AVR    Zannie Cove [facility] Short Stay Center/Anesthesiology Phone [phone] 01/22/2024 4:19 PM

## 2024-01-25 NOTE — Progress Notes (Signed)
 Patient was called to be informed that the surgery time for Monday was changed. Patient or patient's husband weren't available and this writer left a message on the patient's phone. Patient was instructed to be at the hospital at 08:00 o'clock.

## 2024-01-28 ENCOUNTER — Inpatient Hospital Stay (HOSPITAL_COMMUNITY)

## 2024-01-28 ENCOUNTER — Inpatient Hospital Stay (HOSPITAL_COMMUNITY): Payer: Self-pay | Admitting: Physician Assistant

## 2024-01-28 ENCOUNTER — Inpatient Hospital Stay (HOSPITAL_COMMUNITY)
Admission: RE | Disposition: A | Discharge status: Rehabilitation Facility | Payer: Self-pay | Source: Home / Self Care | Attending: Neurosurgery

## 2024-01-28 ENCOUNTER — Inpatient Hospital Stay (HOSPITAL_COMMUNITY): Payer: Self-pay

## 2024-01-28 ENCOUNTER — Inpatient Hospital Stay (HOSPITAL_COMMUNITY)
Admission: RE | Admit: 2024-01-28 | Discharge: 2024-02-01 | DRG: 427 | Disposition: A | Discharge status: Rehabilitation Facility | Payer: 59 | Attending: Neurosurgery | Admitting: Neurosurgery

## 2024-01-28 ENCOUNTER — Other Ambulatory Visit: Payer: Self-pay

## 2024-01-28 DIAGNOSIS — Z7984 Long term (current) use of oral hypoglycemic drugs: Secondary | ICD-10-CM | POA: Diagnosis not present

## 2024-01-28 DIAGNOSIS — Z881 Allergy status to other antibiotic agents status: Secondary | ICD-10-CM

## 2024-01-28 DIAGNOSIS — D62 Acute posthemorrhagic anemia: Secondary | ICD-10-CM | POA: Diagnosis not present

## 2024-01-28 DIAGNOSIS — M4726 Other spondylosis with radiculopathy, lumbar region: Secondary | ICD-10-CM | POA: Diagnosis present

## 2024-01-28 DIAGNOSIS — Z801 Family history of malignant neoplasm of trachea, bronchus and lung: Secondary | ICD-10-CM

## 2024-01-28 DIAGNOSIS — M48062 Spinal stenosis, lumbar region with neurogenic claudication: Secondary | ICD-10-CM | POA: Diagnosis present

## 2024-01-28 DIAGNOSIS — Z833 Family history of diabetes mellitus: Secondary | ICD-10-CM

## 2024-01-28 DIAGNOSIS — Z8249 Family history of ischemic heart disease and other diseases of the circulatory system: Secondary | ICD-10-CM | POA: Diagnosis not present

## 2024-01-28 DIAGNOSIS — M4316 Spondylolisthesis, lumbar region: Secondary | ICD-10-CM | POA: Diagnosis present

## 2024-01-28 DIAGNOSIS — E1142 Type 2 diabetes mellitus with diabetic polyneuropathy: Secondary | ICD-10-CM | POA: Diagnosis present

## 2024-01-28 DIAGNOSIS — G2581 Restless legs syndrome: Secondary | ICD-10-CM | POA: Diagnosis present

## 2024-01-28 DIAGNOSIS — M5416 Radiculopathy, lumbar region: Secondary | ICD-10-CM | POA: Diagnosis not present

## 2024-01-28 DIAGNOSIS — Z952 Presence of prosthetic heart valve: Secondary | ICD-10-CM | POA: Diagnosis not present

## 2024-01-28 DIAGNOSIS — I10 Essential (primary) hypertension: Secondary | ICD-10-CM | POA: Diagnosis present

## 2024-01-28 DIAGNOSIS — Z951 Presence of aortocoronary bypass graft: Secondary | ICD-10-CM | POA: Diagnosis not present

## 2024-01-28 DIAGNOSIS — T148XXA Other injury of unspecified body region, initial encounter: Secondary | ICD-10-CM | POA: Diagnosis not present

## 2024-01-28 DIAGNOSIS — E119 Type 2 diabetes mellitus without complications: Secondary | ICD-10-CM | POA: Diagnosis not present

## 2024-01-28 DIAGNOSIS — Z79899 Other long term (current) drug therapy: Secondary | ICD-10-CM | POA: Diagnosis not present

## 2024-01-28 DIAGNOSIS — Z823 Family history of stroke: Secondary | ICD-10-CM

## 2024-01-28 DIAGNOSIS — R5082 Postprocedural fever: Secondary | ICD-10-CM | POA: Diagnosis not present

## 2024-01-28 DIAGNOSIS — E785 Hyperlipidemia, unspecified: Secondary | ICD-10-CM | POA: Diagnosis present

## 2024-01-28 DIAGNOSIS — F419 Anxiety disorder, unspecified: Secondary | ICD-10-CM | POA: Diagnosis present

## 2024-01-28 DIAGNOSIS — K5900 Constipation, unspecified: Secondary | ICD-10-CM | POA: Diagnosis not present

## 2024-01-28 DIAGNOSIS — E669 Obesity, unspecified: Secondary | ICD-10-CM | POA: Diagnosis present

## 2024-01-28 DIAGNOSIS — Z794 Long term (current) use of insulin: Secondary | ICD-10-CM

## 2024-01-28 DIAGNOSIS — Z825 Family history of asthma and other chronic lower respiratory diseases: Secondary | ICD-10-CM

## 2024-01-28 DIAGNOSIS — Z888 Allergy status to other drugs, medicaments and biological substances status: Secondary | ICD-10-CM | POA: Diagnosis not present

## 2024-01-28 DIAGNOSIS — M5116 Intervertebral disc disorders with radiculopathy, lumbar region: Secondary | ICD-10-CM | POA: Diagnosis present

## 2024-01-28 DIAGNOSIS — Z6831 Body mass index (BMI) 31.0-31.9, adult: Secondary | ICD-10-CM | POA: Diagnosis not present

## 2024-01-28 DIAGNOSIS — Z751 Person awaiting admission to adequate facility elsewhere: Secondary | ICD-10-CM

## 2024-01-28 DIAGNOSIS — Z23 Encounter for immunization: Secondary | ICD-10-CM

## 2024-01-28 DIAGNOSIS — R739 Hyperglycemia, unspecified: Secondary | ICD-10-CM | POA: Diagnosis not present

## 2024-01-28 DIAGNOSIS — E871 Hypo-osmolality and hyponatremia: Secondary | ICD-10-CM | POA: Diagnosis present

## 2024-01-28 DIAGNOSIS — E876 Hypokalemia: Secondary | ICD-10-CM | POA: Diagnosis not present

## 2024-01-28 DIAGNOSIS — M545 Low back pain, unspecified: Secondary | ICD-10-CM | POA: Diagnosis not present

## 2024-01-28 DIAGNOSIS — E1165 Type 2 diabetes mellitus with hyperglycemia: Secondary | ICD-10-CM | POA: Diagnosis not present

## 2024-01-28 DIAGNOSIS — Z7985 Long-term (current) use of injectable non-insulin antidiabetic drugs: Secondary | ICD-10-CM | POA: Diagnosis not present

## 2024-01-28 DIAGNOSIS — Z7982 Long term (current) use of aspirin: Secondary | ICD-10-CM

## 2024-01-28 DIAGNOSIS — Z791 Long term (current) use of non-steroidal anti-inflammatories (NSAID): Secondary | ICD-10-CM

## 2024-01-28 DIAGNOSIS — Z808 Family history of malignant neoplasm of other organs or systems: Secondary | ICD-10-CM

## 2024-01-28 DIAGNOSIS — Z818 Family history of other mental and behavioral disorders: Secondary | ICD-10-CM

## 2024-01-28 HISTORY — PX: BACK SURGERY: SHX140

## 2024-01-28 LAB — GLUCOSE, CAPILLARY
Glucose-Capillary: 109 mg/dL — ABNORMAL HIGH (ref 70–99)
Glucose-Capillary: 156 mg/dL — ABNORMAL HIGH (ref 70–99)
Glucose-Capillary: 177 mg/dL — ABNORMAL HIGH (ref 70–99)

## 2024-01-28 SURGERY — POSTERIOR LUMBAR FUSION 3 LEVEL
Anesthesia: General

## 2024-01-28 MED ORDER — DEXMEDETOMIDINE HCL IN NACL 80 MCG/20ML IV SOLN
INTRAVENOUS | Status: DC | PRN
  Administered 2024-01-28: 8 ug via INTRAVENOUS

## 2024-01-28 MED ORDER — PHENYLEPHRINE 80 MCG/ML (10ML) SYRINGE FOR IV PUSH (FOR BLOOD PRESSURE SUPPORT)
PREFILLED_SYRINGE | INTRAVENOUS | Status: AC
  Filled 2024-01-28: qty 10

## 2024-01-28 MED ORDER — CLONAZEPAM 0.5 MG PO TABS
0.5000 mg | ORAL_TABLET | Freq: Two times a day (BID) | ORAL | Status: DC | PRN
  Administered 2024-01-30: 0.5 mg via ORAL
  Filled 2024-01-28: qty 1

## 2024-01-28 MED ORDER — OXYCODONE HCL 5 MG PO TABS: 5.0000 mg | ORAL_TABLET | Freq: Once | ORAL | Status: DC | PRN

## 2024-01-28 MED ORDER — SODIUM CHLORIDE 0.9% FLUSH
3.0000 mL | Freq: Two times a day (BID) | INTRAVENOUS | Status: DC
  Administered 2024-01-28 – 2024-02-01 (×7): 3 mL via INTRAVENOUS

## 2024-01-28 MED ORDER — GABAPENTIN 100 MG PO CAPS
100.0000 mg | ORAL_CAPSULE | Freq: Three times a day (TID) | ORAL | Status: DC
Start: 2024-01-28 — End: 2024-01-28

## 2024-01-28 MED ORDER — CEFAZOLIN SODIUM-DEXTROSE 2-4 GM/100ML-% IV SOLN
2.0000 g | Freq: Three times a day (TID) | INTRAVENOUS | Status: AC
  Administered 2024-01-28 – 2024-01-29 (×2): 2 g via INTRAVENOUS
  Filled 2024-01-28 (×2): qty 100

## 2024-01-28 MED ORDER — PHENYLEPHRINE 80 MCG/ML (10ML) SYRINGE FOR IV PUSH (FOR BLOOD PRESSURE SUPPORT)
PREFILLED_SYRINGE | INTRAVENOUS | Status: DC | PRN
  Administered 2024-01-28: 160 ug via INTRAVENOUS
  Administered 2024-01-28 (×3): 80 ug via INTRAVENOUS

## 2024-01-28 MED ORDER — PROPOFOL 10 MG/ML IV BOLUS
INTRAVENOUS | Status: AC
  Filled 2024-01-28: qty 20

## 2024-01-28 MED ORDER — BUPIVACAINE LIPOSOME 1.3 % IJ SUSP
INTRAMUSCULAR | Status: DC | PRN
  Administered 2024-01-28: 20 mL

## 2024-01-28 MED ORDER — EPHEDRINE SULFATE-NACL 50-0.9 MG/10ML-% IV SOSY
PREFILLED_SYRINGE | INTRAVENOUS | Status: DC | PRN
  Administered 2024-01-28: 5 mg via INTRAVENOUS

## 2024-01-28 MED ORDER — ONDANSETRON HCL 4 MG/2ML IJ SOLN
4.0000 mg | Freq: Four times a day (QID) | INTRAMUSCULAR | Status: DC | PRN
  Administered 2024-01-28: 4 mg via INTRAVENOUS
  Filled 2024-01-28: qty 2

## 2024-01-28 MED ORDER — HYDROMORPHONE HCL 1 MG/ML IJ SOLN
INTRAMUSCULAR | Status: AC
  Filled 2024-01-28: qty 0.5

## 2024-01-28 MED ORDER — ACETAMINOPHEN 500 MG PO TABS
1000.0000 mg | ORAL_TABLET | Freq: Four times a day (QID) | ORAL | Status: AC
  Administered 2024-01-28 – 2024-01-29 (×3): 1000 mg via ORAL
  Filled 2024-01-28 (×3): qty 2

## 2024-01-28 MED ORDER — METOPROLOL SUCCINATE ER 25 MG PO TB24
25.0000 mg | ORAL_TABLET | Freq: Two times a day (BID) | ORAL | Status: DC
Start: 2024-01-29 — End: 2024-02-01
  Administered 2024-01-29 – 2024-02-01 (×5): 25 mg via ORAL
  Filled 2024-01-28 (×7): qty 1

## 2024-01-28 MED ORDER — ROCURONIUM BROMIDE 10 MG/ML (PF) SYRINGE
PREFILLED_SYRINGE | INTRAVENOUS | Status: AC
  Filled 2024-01-28: qty 10

## 2024-01-28 MED ORDER — ORAL CARE MOUTH RINSE: 15.0000 mL | Freq: Once | OROMUCOSAL | Status: AC

## 2024-01-28 MED ORDER — HYDROMORPHONE HCL 1 MG/ML IJ SOLN: 0.5000 mg | INTRAMUSCULAR | Status: DC | PRN

## 2024-01-28 MED ORDER — THROMBIN 5000 UNITS EX KIT
PACK | CUTANEOUS | Status: AC
  Filled 2024-01-28: qty 1

## 2024-01-28 MED ORDER — BUPIVACAINE-EPINEPHRINE (PF) 0.5% -1:200000 IJ SOLN
INTRAMUSCULAR | Status: AC
  Filled 2024-01-28: qty 30

## 2024-01-28 MED ORDER — LACTATED RINGERS IV SOLN: INTRAVENOUS | Status: DC

## 2024-01-28 MED ORDER — DEXMEDETOMIDINE HCL IN NACL 80 MCG/20ML IV SOLN
INTRAVENOUS | Status: AC
  Filled 2024-01-28: qty 20

## 2024-01-28 MED ORDER — INSULIN ASPART 100 UNIT/ML IJ SOLN
0.0000 [IU] | Freq: Every day | INTRAMUSCULAR | Status: DC
Start: 2024-01-28 — End: 2024-02-01
  Administered 2024-01-31: 3 [IU] via SUBCUTANEOUS

## 2024-01-28 MED ORDER — CHLORHEXIDINE GLUCONATE CLOTH 2 % EX PADS: 6.0000 | MEDICATED_PAD | Freq: Once | CUTANEOUS | Status: DC

## 2024-01-28 MED ORDER — ACETAMINOPHEN 650 MG RE SUPP: 650.0000 mg | RECTAL | Status: DC | PRN

## 2024-01-28 MED ORDER — AMISULPRIDE (ANTIEMETIC) 5 MG/2ML IV SOLN: 10.0000 mg | Freq: Once | INTRAVENOUS | Status: DC | PRN

## 2024-01-28 MED ORDER — MENTHOL 3 MG MT LOZG: 1.0000 | LOZENGE | OROMUCOSAL | Status: DC | PRN

## 2024-01-28 MED ORDER — METFORMIN HCL ER 500 MG PO TB24
1000.0000 mg | ORAL_TABLET | Freq: Every day | ORAL | Status: DC
  Administered 2024-01-29 – 2024-02-01 (×4): 1000 mg via ORAL
  Filled 2024-01-28 (×4): qty 2

## 2024-01-28 MED ORDER — SODIUM CHLORIDE 0.9% FLUSH: 3.0000 mL | INTRAVENOUS | Status: DC | PRN

## 2024-01-28 MED ORDER — ACETAMINOPHEN 10 MG/ML IV SOLN: 1000.0000 mg | Freq: Once | INTRAVENOUS | Status: DC | PRN

## 2024-01-28 MED ORDER — ONDANSETRON HCL 4 MG/2ML IJ SOLN
INTRAMUSCULAR | Status: AC
Start: 2024-01-28 — End: ?
  Filled 2024-01-28: qty 2

## 2024-01-28 MED ORDER — EPHEDRINE 5 MG/ML INJ
INTRAVENOUS | Status: AC
  Filled 2024-01-28: qty 5

## 2024-01-28 MED ORDER — CITALOPRAM HYDROBROMIDE 10 MG PO TABS
40.0000 mg | ORAL_TABLET | Freq: Every day | ORAL | Status: DC
  Administered 2024-01-29 – 2024-02-01 (×4): 40 mg via ORAL
  Filled 2024-01-28: qty 2
  Filled 2024-01-28 (×3): qty 4

## 2024-01-28 MED ORDER — ONDANSETRON HCL 4 MG/2ML IJ SOLN
INTRAMUSCULAR | Status: DC | PRN
  Administered 2024-01-28: 4 mg via INTRAVENOUS

## 2024-01-28 MED ORDER — ATORVASTATIN CALCIUM 80 MG PO TABS
80.0000 mg | ORAL_TABLET | Freq: Every day | ORAL | Status: DC
  Administered 2024-01-29 – 2024-02-01 (×4): 80 mg via ORAL
  Filled 2024-01-28 (×4): qty 1

## 2024-01-28 MED ORDER — BISACODYL 10 MG RE SUPP
10.0000 mg | Freq: Every day | RECTAL | Status: DC | PRN
  Administered 2024-01-31: 10 mg via RECTAL
  Filled 2024-01-28: qty 1

## 2024-01-28 MED ORDER — KETAMINE HCL 50 MG/5ML IJ SOSY
PREFILLED_SYRINGE | INTRAMUSCULAR | Status: DC | PRN
Start: 2024-01-28 — End: 2024-01-28
  Administered 2024-01-28 (×2): 10 mg via INTRAVENOUS
  Administered 2024-01-28: 20 mg via INTRAVENOUS
  Administered 2024-01-28: 10 mg via INTRAVENOUS

## 2024-01-28 MED ORDER — ACETAMINOPHEN 325 MG PO TABS
650.0000 mg | ORAL_TABLET | ORAL | Status: DC | PRN
  Administered 2024-01-30 – 2024-01-31 (×7): 650 mg via ORAL
  Filled 2024-01-28 (×7): qty 2

## 2024-01-28 MED ORDER — VANCOMYCIN HCL IN DEXTROSE 1-5 GM/200ML-% IV SOLN
1000.0000 mg | INTRAVENOUS | Status: AC
  Administered 2024-01-28: 1000 mg via INTRAVENOUS
  Filled 2024-01-28: qty 200

## 2024-01-28 MED ORDER — HEMOSTATIC AGENTS (NO CHARGE) OPTIME
TOPICAL | Status: DC | PRN
  Administered 2024-01-28: 1 via TOPICAL

## 2024-01-28 MED ORDER — ZOLPIDEM TARTRATE 5 MG PO TABS: 5.0000 mg | ORAL_TABLET | Freq: Every evening | ORAL | Status: DC | PRN

## 2024-01-28 MED ORDER — FERROUS SULFATE 325 (65 FE) MG PO TABS
325.0000 mg | ORAL_TABLET | Freq: Every day | ORAL | Status: DC
  Administered 2024-01-29 – 2024-02-01 (×4): 325 mg via ORAL
  Filled 2024-01-28 (×4): qty 1

## 2024-01-28 MED ORDER — BACITRACIN ZINC 500 UNIT/GM EX OINT
TOPICAL_OINTMENT | CUTANEOUS | Status: DC | PRN
  Administered 2024-01-28: 1 via TOPICAL

## 2024-01-28 MED ORDER — INSULIN ASPART 100 UNIT/ML IJ SOLN
0.0000 [IU] | Freq: Three times a day (TID) | INTRAMUSCULAR | Status: DC
  Administered 2024-01-29: 3 [IU] via SUBCUTANEOUS
  Administered 2024-01-29: 5 [IU] via SUBCUTANEOUS
  Administered 2024-01-29: 3 [IU] via SUBCUTANEOUS
  Administered 2024-01-30 (×3): 5 [IU] via SUBCUTANEOUS
  Administered 2024-01-31: 3 [IU] via SUBCUTANEOUS
  Administered 2024-01-31 – 2024-02-01 (×3): 5 [IU] via SUBCUTANEOUS
  Administered 2024-02-01: 3 [IU] via SUBCUTANEOUS

## 2024-01-28 MED ORDER — FENTANYL CITRATE (PF) 250 MCG/5ML IJ SOLN
INTRAMUSCULAR | Status: AC
  Filled 2024-01-28: qty 5

## 2024-01-28 MED ORDER — DEXAMETHASONE SODIUM PHOSPHATE 10 MG/ML IJ SOLN
INTRAMUSCULAR | Status: AC
Start: 2024-01-28 — End: ?
  Filled 2024-01-28: qty 1

## 2024-01-28 MED ORDER — INSULIN ASPART 100 UNIT/ML IJ SOLN: 0.0000 [IU] | INTRAMUSCULAR | Status: DC

## 2024-01-28 MED ORDER — GABAPENTIN 100 MG PO CAPS
100.0000 mg | ORAL_CAPSULE | Freq: Every day | ORAL | Status: DC
  Administered 2024-01-29 – 2024-02-01 (×4): 100 mg via ORAL
  Filled 2024-01-28 (×4): qty 1

## 2024-01-28 MED ORDER — KETAMINE HCL 50 MG/5ML IJ SOSY
PREFILLED_SYRINGE | INTRAMUSCULAR | Status: AC
Start: 2024-01-28 — End: ?
  Filled 2024-01-28: qty 5

## 2024-01-28 MED ORDER — LIDOCAINE 2% (20 MG/ML) 5 ML SYRINGE
INTRAMUSCULAR | Status: DC | PRN
  Administered 2024-01-28: 60 mg via INTRAVENOUS

## 2024-01-28 MED ORDER — PHENOL 1.4 % MT LIQD: 1.0000 | OROMUCOSAL | Status: DC | PRN

## 2024-01-28 MED ORDER — OXYCODONE HCL 5 MG PO TABS
10.0000 mg | ORAL_TABLET | ORAL | Status: DC | PRN
  Administered 2024-01-28 – 2024-02-01 (×17): 10 mg via ORAL
  Filled 2024-01-28 (×17): qty 2

## 2024-01-28 MED ORDER — OXYCODONE HCL 5 MG PO TABS: 5.0000 mg | ORAL_TABLET | ORAL | Status: DC | PRN

## 2024-01-28 MED ORDER — SUGAMMADEX SODIUM 200 MG/2ML IV SOLN
INTRAVENOUS | Status: DC | PRN
  Administered 2024-01-28: 180 mg via INTRAVENOUS

## 2024-01-28 MED ORDER — POTASSIUM CHLORIDE CRYS ER 20 MEQ PO TBCR
20.0000 meq | EXTENDED_RELEASE_TABLET | Freq: Every day | ORAL | Status: DC
Start: 2024-01-28 — End: 2024-02-01
  Administered 2024-01-28 – 2024-02-01 (×5): 20 meq via ORAL
  Filled 2024-01-28 (×5): qty 1

## 2024-01-28 MED ORDER — MIDAZOLAM HCL 2 MG/2ML IJ SOLN
INTRAMUSCULAR | Status: DC | PRN
  Administered 2024-01-28: 2 mg via INTRAVENOUS

## 2024-01-28 MED ORDER — SODIUM CHLORIDE 0.9 % IV SOLN: 250.0000 mL | INTRAVENOUS | Status: AC

## 2024-01-28 MED ORDER — PHENYLEPHRINE HCL-NACL 20-0.9 MG/250ML-% IV SOLN
INTRAVENOUS | Status: DC | PRN
  Administered 2024-01-28: 30 ug/min via INTRAVENOUS

## 2024-01-28 MED ORDER — LIDOCAINE 2% (20 MG/ML) 5 ML SYRINGE
INTRAMUSCULAR | Status: AC
  Filled 2024-01-28: qty 5

## 2024-01-28 MED ORDER — GABAPENTIN 100 MG PO CAPS
200.0000 mg | ORAL_CAPSULE | Freq: Every day | ORAL | Status: DC
  Administered 2024-01-28 – 2024-01-31 (×4): 200 mg via ORAL
  Filled 2024-01-28 (×4): qty 2

## 2024-01-28 MED ORDER — MIDAZOLAM HCL 2 MG/2ML IJ SOLN
INTRAMUSCULAR | Status: AC
  Filled 2024-01-28: qty 2

## 2024-01-28 MED ORDER — BUPIVACAINE LIPOSOME 1.3 % IJ SUSP
INTRAMUSCULAR | Status: AC
  Filled 2024-01-28: qty 20

## 2024-01-28 MED ORDER — PRAMIPEXOLE DIHYDROCHLORIDE 1 MG PO TABS
1.0000 mg | ORAL_TABLET | Freq: Every day | ORAL | Status: DC
  Administered 2024-01-28 – 2024-01-31 (×4): 1 mg via ORAL
  Filled 2024-01-28: qty 1
  Filled 2024-01-28: qty 4
  Filled 2024-01-28 (×3): qty 1

## 2024-01-28 MED ORDER — CYCLOBENZAPRINE HCL 10 MG PO TABS
10.0000 mg | ORAL_TABLET | Freq: Three times a day (TID) | ORAL | Status: DC | PRN
  Administered 2024-01-28 – 2024-01-30 (×4): 10 mg via ORAL
  Filled 2024-01-28 (×5): qty 1

## 2024-01-28 MED ORDER — DOCUSATE SODIUM 100 MG PO CAPS
100.0000 mg | ORAL_CAPSULE | Freq: Two times a day (BID) | ORAL | Status: DC
  Administered 2024-01-28 – 2024-02-01 (×8): 100 mg via ORAL
  Filled 2024-01-28 (×8): qty 1

## 2024-01-28 MED ORDER — VASHE WOUND IRRIGATION OPTIME
TOPICAL | Status: DC | PRN
  Administered 2024-01-28: 34 [oz_av] via TOPICAL

## 2024-01-28 MED ORDER — ALBUMIN HUMAN 5 % IV SOLN: INTRAVENOUS | Status: DC | PRN

## 2024-01-28 MED ORDER — FENTANYL CITRATE (PF) 250 MCG/5ML IJ SOLN
INTRAMUSCULAR | Status: DC | PRN
  Administered 2024-01-28: 150 ug via INTRAVENOUS

## 2024-01-28 MED ORDER — THROMBIN 5000 UNITS EX SOLR
OROMUCOSAL | Status: DC | PRN
  Administered 2024-01-28 (×3): 5 mL via TOPICAL

## 2024-01-28 MED ORDER — INSULIN ASPART 100 UNIT/ML IJ SOLN
0.0000 [IU] | INTRAMUSCULAR | Status: AC | PRN
  Administered 2024-01-28: 4 [IU] via SUBCUTANEOUS
  Administered 2024-01-28: 3 [IU] via SUBCUTANEOUS
  Administered 2024-01-28: 2 [IU] via SUBCUTANEOUS

## 2024-01-28 MED ORDER — PROPOFOL 10 MG/ML IV BOLUS
INTRAVENOUS | Status: DC | PRN
  Administered 2024-01-28: 150 mg via INTRAVENOUS

## 2024-01-28 MED ORDER — CHLORHEXIDINE GLUCONATE 0.12 % MT SOLN
15.0000 mL | Freq: Once | OROMUCOSAL | Status: AC
  Administered 2024-01-28: 15 mL via OROMUCOSAL
  Filled 2024-01-28: qty 15

## 2024-01-28 MED ORDER — HYDROMORPHONE HCL 1 MG/ML IJ SOLN
INTRAMUSCULAR | Status: DC | PRN
  Administered 2024-01-28 (×2): .25 mg via INTRAVENOUS

## 2024-01-28 MED ORDER — BACITRACIN ZINC 500 UNIT/GM EX OINT
TOPICAL_OINTMENT | CUTANEOUS | Status: AC
  Filled 2024-01-28: qty 28.35

## 2024-01-28 MED ORDER — OXYCODONE HCL 5 MG/5ML PO SOLN: 5.0000 mg | Freq: Once | ORAL | Status: DC | PRN

## 2024-01-28 MED ORDER — FENTANYL CITRATE (PF) 100 MCG/2ML IJ SOLN: 25.0000 ug | INTRAMUSCULAR | Status: DC | PRN

## 2024-01-28 MED ORDER — BUPIVACAINE-EPINEPHRINE (PF) 0.5% -1:200000 IJ SOLN
INTRAMUSCULAR | Status: DC | PRN
  Administered 2024-01-28: 10 mL

## 2024-01-28 MED ORDER — 0.9 % SODIUM CHLORIDE (POUR BTL) OPTIME
TOPICAL | Status: DC | PRN
  Administered 2024-01-28 (×2): 1000 mL

## 2024-01-28 MED ORDER — ROCURONIUM BROMIDE 10 MG/ML (PF) SYRINGE
PREFILLED_SYRINGE | INTRAVENOUS | Status: DC | PRN
  Administered 2024-01-28 (×4): 10 mg via INTRAVENOUS
  Administered 2024-01-28: 60 mg via INTRAVENOUS
  Administered 2024-01-28: 20 mg via INTRAVENOUS
  Administered 2024-01-28 (×3): 10 mg via INTRAVENOUS
  Administered 2024-01-28: 20 mg via INTRAVENOUS

## 2024-01-28 MED ORDER — ONDANSETRON HCL 4 MG PO TABS: 4.0000 mg | ORAL_TABLET | Freq: Four times a day (QID) | ORAL | Status: DC | PRN

## 2024-01-28 MED ORDER — FUROSEMIDE 40 MG PO TABS
40.0000 mg | ORAL_TABLET | Freq: Every day | ORAL | Status: DC
  Administered 2024-01-29 – 2024-02-01 (×4): 40 mg via ORAL
  Filled 2024-01-28 (×5): qty 1

## 2024-01-28 MED ORDER — DEXAMETHASONE SODIUM PHOSPHATE 10 MG/ML IJ SOLN
INTRAMUSCULAR | Status: DC | PRN
  Administered 2024-01-28: 5 mg via INTRAVENOUS

## 2024-01-28 SURGICAL SUPPLY — 65 items
BAG COUNTER SPONGE SURGICOUNT (BAG) ×1 IMPLANT
BASKET BONE COLLECTION (BASKET) ×1 IMPLANT
BENZOIN TINCTURE PRP APPL 2/3 (GAUZE/BANDAGES/DRESSINGS) ×1 IMPLANT
BLADE CLIPPER SURG (BLADE) IMPLANT
BUR MATCHSTICK NEURO 3.0 LAGG (BURR) ×1 IMPLANT
BUR PRECISION FLUTE 6.0 (BURR) ×1 IMPLANT
CAGE ALTERA 10X31X9-13 15D (Cage) IMPLANT
CANISTER SUCT 3000ML PPV (MISCELLANEOUS) ×1 IMPLANT
CAP LOCK DLX THRD (Cap) IMPLANT
CLEANSER WND VASHE INSTL 34OZ (WOUND CARE) ×1 IMPLANT
CNTNR URN SCR LID CUP LEK RST (MISCELLANEOUS) ×1 IMPLANT
CONNECTOR CROSS 6.0-6.35X48-60 (Connector) IMPLANT
COVER BACK TABLE 60X90IN (DRAPES) ×1 IMPLANT
DRAPE C-ARM 42X72 X-RAY (DRAPES) ×2 IMPLANT
DRAPE HALF SHEET 40X57 (DRAPES) ×1 IMPLANT
DRAPE LAPAROTOMY 100X72X124 (DRAPES) ×1 IMPLANT
DRAPE SURG 17X23 STRL (DRAPES) ×1 IMPLANT
DRSG OPSITE POSTOP 4X10 (GAUZE/BANDAGES/DRESSINGS) IMPLANT
DRSG OPSITE POSTOP 4X6 (GAUZE/BANDAGES/DRESSINGS) ×1 IMPLANT
ELECT BLADE 4.0 EZ CLEAN MEGAD (MISCELLANEOUS) ×1 IMPLANT
ELECT REM PT RETURN 9FT ADLT (ELECTROSURGICAL) ×1 IMPLANT
ELECTRODE BLDE 4.0 EZ CLN MEGD (MISCELLANEOUS) ×1 IMPLANT
ELECTRODE REM PT RTRN 9FT ADLT (ELECTROSURGICAL) ×1 IMPLANT
EVACUATOR 1/8 PVC DRAIN (DRAIN) IMPLANT
GAUZE 4X4 16PLY ~~LOC~~+RFID DBL (SPONGE) ×1 IMPLANT
GLOVE BIO SURGEON STRL SZ 6 (GLOVE) ×1 IMPLANT
GLOVE BIO SURGEON STRL SZ8 (GLOVE) ×2 IMPLANT
GLOVE BIO SURGEON STRL SZ8.5 (GLOVE) ×2 IMPLANT
GLOVE BIOGEL PI IND STRL 6.5 (GLOVE) ×1 IMPLANT
GLOVE EXAM NITRILE XL STR (GLOVE) IMPLANT
GOWN STRL REUS W/ TWL LRG LVL3 (GOWN DISPOSABLE) ×1 IMPLANT
GOWN STRL REUS W/ TWL XL LVL3 (GOWN DISPOSABLE) ×2 IMPLANT
GOWN STRL REUS W/TWL 2XL LVL3 (GOWN DISPOSABLE) IMPLANT
GRAFT TRINITY ELITE LGE HUMAN (Tissue) IMPLANT
HEMOSTAT POWDER KIT SURGIFOAM (HEMOSTASIS) ×1 IMPLANT
KIT BASIN OR (CUSTOM PROCEDURE TRAY) ×1 IMPLANT
KIT GRAFTMAG DEL NEURO DISP (NEUROSURGERY SUPPLIES) IMPLANT
KIT POSITION SURG JACKSON T1 (MISCELLANEOUS) ×1 IMPLANT
KIT TURNOVER KIT B (KITS) ×1 IMPLANT
NDL HYPO 21X1.5 SAFETY (NEEDLE) ×1 IMPLANT
NDL HYPO 22X1.5 SAFETY MO (MISCELLANEOUS) ×1 IMPLANT
NEEDLE HYPO 21X1.5 SAFETY (NEEDLE) ×1 IMPLANT
NEEDLE HYPO 22X1.5 SAFETY MO (MISCELLANEOUS) ×1 IMPLANT
NS IRRIG 1000ML POUR BTL (IV SOLUTION) ×1 IMPLANT
PACK LAMINECTOMY NEURO (CUSTOM PROCEDURE TRAY) ×1 IMPLANT
PAD ARMBOARD POSITIONER FOAM (MISCELLANEOUS) ×3 IMPLANT
PATTIES SURGICAL .5 X1 (DISPOSABLE) IMPLANT
PATTIES SURGICAL 1X1 (DISPOSABLE) IMPLANT
PUTTY DBM 10CC CALC GRAN (Putty) IMPLANT
ROD CURVED TI 6.35X95 (Rod) IMPLANT
SCREW PA DLX CREO 7.5X50 (Screw) IMPLANT
SPACER ALTERA 10X31 8-12MM-8 (Spacer) IMPLANT
SPIKE FLUID TRANSFER (MISCELLANEOUS) ×1 IMPLANT
SPONGE NEURO XRAY DETECT 1X3 (DISPOSABLE) IMPLANT
SPONGE SURGIFOAM ABS GEL 100 (HEMOSTASIS) IMPLANT
SPONGE SURGIFOAM ABS GEL SZ50 (HEMOSTASIS) IMPLANT
SPONGE T-LAP 4X18 ~~LOC~~+RFID (SPONGE) IMPLANT
STRIP CLOSURE SKIN 1/2X4 (GAUZE/BANDAGES/DRESSINGS) ×1 IMPLANT
SUT VIC AB 1 CT1 18XBRD ANBCTR (SUTURE) ×2 IMPLANT
SUT VIC AB 2-0 CP2 18 (SUTURE) ×2 IMPLANT
SYR 20ML LL LF (SYRINGE) IMPLANT
TOWEL GREEN STERILE (TOWEL DISPOSABLE) ×1 IMPLANT
TOWEL GREEN STERILE FF (TOWEL DISPOSABLE) ×1 IMPLANT
TRAY FOLEY MTR SLVR 16FR STAT (SET/KITS/TRAYS/PACK) ×1 IMPLANT
WATER STERILE IRR 1000ML POUR (IV SOLUTION) ×1 IMPLANT

## 2024-01-28 NOTE — H&P (Signed)
 Subjective: The patient is a 63 year old white female who has complained of back and bilateral buttock and leg pain consistent with neurogenic claudication.  She has failed medical management and was worked up with lumbar x-rays and lumbar MRI which demonstrated multilevel spondylolisthesis, stenosis, etc.  I discussed the various treatment options with her.  She has decided proceed with surgery.  Past Medical History:  Diagnosis Date   Anxiety    Aortic stenosis    Mild to moderate   Bicuspid aortic valve    Essential hypertension, benign    GERD (gastroesophageal reflux disease)    Heart murmur    S/P Aortic Valve Replacement July 2024   History of cardiac catheterization    Normal coronaries 2008   Hyperlipidemia    Migraine headache    MVP (mitral valve prolapse)    Obesity    Pain in joint, ankle and foot    Chronic   Palpitations    Documented PACs by previous monitoring   Restless leg syndrome    Skin tag    Type 2 diabetes mellitus (HCC)    Unilateral primary osteoarthritis, right knee 09/06/2016    Past Surgical History:  Procedure Laterality Date   ACHILLES TENDON REPAIR  2003   Left   AORTIC VALVE REPLACEMENT N/A 05/14/2023   Procedure: AORTIC VALVE REPLACEMENT (AVR);  Surgeon: Corliss Skains, MD;  Location: [facility] OR;  Service: Open Heart Surgery;  Laterality: N/A;   CARDIAC CATHETERIZATION  2008   Normal coronary arteries; normal LV systolic function; mild dilation of aortic root; mild dilation of proximal ascending aorta; likely bicuspid aortic valve   CHOLECYSTECTOMY  2001   "Poor EF at 17%"   COLONOSCOPY N/A 07/17/2014   Procedure: COLONOSCOPY;  Surgeon: Malissa Hippo, MD;  Location: [facility] ENDO SUITE;  Service: Endoscopy;  Laterality: N/A;  105   ESOPHAGEAL DILATION N/A 06/11/2015   Procedure: ESOPHAGEAL DILATION;  Surgeon: Malissa Hippo, MD;  Location: [facility] ORS;  Service: Endoscopy;  Laterality: N/AElease Hashimoto 54/56/58   ESOPHAGOGASTRODUODENOSCOPY N/A  07/17/2014   Procedure: ESOPHAGOGASTRODUODENOSCOPY (EGD);  Surgeon: Malissa Hippo, MD;  Location: [facility] SU[facility];  Service: Endoscopy;  Laterality: N/A;   ESOPHAGOGASTRODUODENOSCOPY (EGD) WITH PROPOFOL N/A 06/11/2015   Procedure: ESOPHAGOGASTRODUODENOSCOPY (EGD) WITH PROPOFOL;  Surgeon: Malissa Hippo, MD;  Location: [facility] ORS;  Service: Endoscopy;  Laterality: N/A;  730   KNEE ARTHROSCOPY WITH MEDIAL MENISECTOMY Right 07/02/2018   Procedure: RIGHT KNEE ARTHROSCOPY, DEBRIDEMENT, MEDIAL MENISECTOMY;  Surgeon: [name], MD;  Location: [facility] OR;  Service: Orthopedics;  Laterality: Right;   KNEE SURGERY     MALONEY DILATION N/A 07/17/2014   Procedure: MALONEY DILATION;  Surgeon: Malissa Hippo, MD;  Location: [facility];  Service: Endoscopy;  Laterality: N/A;   RIGHT/LEFT HEART CATH AND CORONARY ANGIOGRAPHY N/A 05/02/2023   Procedure: RIGHT/LEFT HEART CATH AND CORONARY ANGIOGRAPHY;  Surgeon: Swazil[place], Peter M, MD;  Location: [facility] INVASIVE CV LAB;  Service: Cardiovascular;  Laterality: N/A;   TEE WITHOUT CARDIOVERSION N/A 05/14/2023   Procedure: TRANSESOPHAGEAL ECHOCARDIOGRAM;  Surgeon: Corliss Skains, MD;  Location: [facility] OR;  Service: Open Heart Surgery;  Laterality: N/A;   UMBILICAL HERNIA REPAIR  2003    Allergies  Allergen Reactions   Amlodipine Besylate Shortness Of Breath   Morphine And Codeine Shortness Of Breath and Other (See Comments)    Chest pain   Amitriptyline Other (See Comments)    Unknown reaction   Butorphanol Tartrate Other (See Comments)  REACTION: ED visit and got for migraine - not sure of response   Compazine [Prochlorperazine Edisylate] Other (See Comments)    Altered mental status   Rocephin [Ceftriaxone Sodium In Dextrose]     Given at GYN for infection - nauseated, dizziness (w/in last 7-8 years)   Sumatriptan Other (See Comments)    REACTION: HTN    Social History   Tobacco Use   Smoking status: Never   Smokeless tobacco: Never  Substance Use  Topics   Alcohol use: No    Alcohol/week: 0.0 standard drinks of alcohol    Family History  Problem Relation Age of Onset   Depression Mother    Cancer Mother        Lung mets (unsure as to what kind)   Diabetes Mother    Hypertension Father    Depression Father    Heart attack Father    Depression Daughter    Anxiety disorder Daughter    Stroke Daughter    Transient ischemic attack Brother    Emphysema Maternal Grandfather    Brain cancer Paternal Grandmother    Heart attack Paternal Grandfather    Prior to Admission medications   Medication Sig Start Date End Date Taking? Authorizing Provider  acetaminophen-codeine (TYLENOL #3) 300-30 MG tablet Take 1 tablet by mouth 2 (two) times daily as needed for moderate pain (pain score 4-6) (migraines). 01/19/24  Yes [provider]  atorvastatin (LIPITOR) 80 MG tablet TAKE 1 TABLET BY MOUTH ONCE DAILY 10/18/23  Yes Pricilla Riffle, MD  celecoxib (CELEBREX) 200 MG capsule Take 200 mg by mouth 2 (two) times daily. 04/20/23  Yes [provider]  citalopram (CELEXA) 40 MG tablet Take 40 mg by mouth daily. 03/03/20  Yes [provider]  cyclobenzaprine (FLEXERIL) 10 MG tablet Take 10 mg by mouth 3 (three) times daily as needed for muscle spasms. 04/04/23  Yes [provider]  ferrous sulfate 325 (65 FE) MG tablet Take 1 tablet (325 mg total) by mouth daily with breakfast. 05/21/23  Yes Roddenberry, Myron G, PA-C  furosemide (LASIX) 40 MG tablet Take 0.5 tablets (20 mg total) by mouth daily as needed (for swelling and weight gain). Patient taking differently: Take 40 mg by mouth daily. 09/25/23 09/24/24 Yes Sharlene Dory, NP  gabapentin (NEURONTIN) 100 MG capsule Take 100 mg by mouth 3 (three) times daily. Take 100mg  (1 capsule) by mouth every morning and 200mg  (2 capsules) in the evening. 11/26/23  Yes [provider]  insulin glargine-yfgn (SEMGLEE, YFGN,) 100 UNIT/ML Pen Inject 30 Units into the skin at  bedtime. Patient taking differently: Inject 40 Units into the skin at bedtime. 01/04/24  Yes Dani Gobble, NP  metFORMIN (GLUCOPHAGE-XR) 500 MG 24 hr tablet TAKE 2 TABLETS BY MOUTH DAILY WITH BREAKFAST 12/18/23  Yes Dani Gobble, NP  metoprolol succinate (TOPROL-XL) 25 MG 24 hr tablet Take 1 tablet (25 mg total) by mouth 2 (two) times daily at 10 AM and 5 PM. Take with or immediately following a meal. 12/03/23  Yes Pricilla Riffle, MD  MOUNJARO 7.5 MG/0.5ML Pen INJECT 7.5 MG UNDER THE SKIN WEEKLY 11/16/23  Yes Dani Gobble, NP  potassium chloride SA (KLOR-CON M) 20 MEQ tablet TAKE 1 TABLET BY MOUTH DAILY 11/19/23  Yes Sharlene Dory, NP  pramipexole (MIRAPEX) 0.5 MG tablet Take 1 mg by mouth at bedtime. 04/05/20  Yes [provider]  promethazine (PHENERGAN) 25 MG tablet Take 25 mg by mouth 2 (two) times  daily as needed.   Yes [provider]  traZODone (DESYREL) 150 MG tablet Take 150 mg by mouth at bedtime.   Yes [provider]  aspirin EC 325 MG tablet Take 1 tablet (325 mg total) by mouth daily. 05/21/23   Leary Roca, PA-C  clonazePAM (KLONOPIN) 0.5 MG tablet Take 0.5 mg by mouth 2 (two) times daily as needed for anxiety. 04/24/23   [provider]  Continuous Blood Gluc Receiver (DEXCOM G7 RECEIVER) DEVI USE 1 receiver continuously 07/05/22   [provider]  Continuous Blood Gluc Sensor (DEXCOM G7 SENSOR) MISC APPLY 1 SENSOR EVERY 10 DAYS 07/05/22   [provider]     Review of Systems  Positive ROS: As above  All other systems have been reviewed and were otherwise negative with the exception of those mentioned in the HPI and as above.  Objective: Vital signs in last 24 hours: Temp:  [98.1 F (36.7 C)] 98.1 F (36.7 C) (03/31 0808) Pulse Rate:  [87] 87 (03/31 0808) Resp:  [18] 18 (03/31 0808) BP: (157)/(82) 157/82 (03/31 0808) SpO2:  [98 %] 98 % (03/31 0808) Weight:  [87.1 kg] 87.1 kg (03/31 0808) Estimated body  mass index is 30.99 kg/m as calculated from the following:   Height as of this encounter: 5\' 6"  (1.676 m).   Weight as of this encounter: 87.1 kg.   General Appearance: Alert Head: Normocephalic, without obvious abnormality, atraumatic Eyes: PERRL, conjunctiva/corneas clear, EOM's intact,    Ears: Normal  Throat: Normal  Neck: Supple, Back: unremarkable Lungs: Clear to auscultation bilaterally, respirations unlabored Heart: Regular rate and rhythm, no murmur, rub or gallop Abdomen: Soft, non-tender Extremities: Extremities normal, atraumatic, no cyanosis or edema Skin: unremarkable  NEUROLOGIC:   Mental status: alert and oriented,Motor Exam - grossly normal Sensory Exam - grossly normal Reflexes:  Coordination - grossly normal Gait - grossly normal Balance - grossly normal Cranial Nerves: I: smell Not tested  II: visual acuity  OS: Normal  OD: Normal   II: visual fields Full to confrontation  II: pupils Equal, round, reactive to light  III,VII: ptosis None  III,IV,VI: extraocular muscles  Full ROM  V: mastication Normal  V: facial light touch sensation  Normal  V,VII: corneal reflex  Present  VII: facial muscle function - upper  Normal  VII: facial muscle function - lower Normal  VIII: hearing Not tested  IX: soft palate elevation  Normal  IX,X: gag reflex Present  XI: trapezius strength  5/5  XI: sternocleidomastoid strength 5/5  XI: neck flexion strength  5/5  XII: tongue strength  Normal    Data Review Lab Results  Component Value Date   WBC 7.1 01/17/2024   HGB 13.2 01/17/2024   HCT 39.3 01/17/2024   MCV 86.2 01/17/2024   PLT 242 01/17/2024   Lab Results  Component Value Date   NA 139 01/17/2024   K 4.7 01/17/2024   CL 100 01/17/2024   CO2 29 01/17/2024   BUN 18 01/17/2024   CREATININE 0.99 01/17/2024   GLUCOSE 116 (H) 01/17/2024   Lab Results  Component Value Date   INR 1.6 (H) 05/14/2023    Assessment/Plan: Lumbar spondylolisthesis,  lumbar facet arthropathy, lumbar spinal stenosis, lumbar degenerative disease, lumbar radiculopathy, neurogenic claudication, lumbago: I have discussed the situation with the patient.  I reviewed her imaging studies with her and pointed out the abnormalities.  We have discussed the various treatment options including surgery.  I described the surgical treatment  option of an L2-3, L3-4 and L4-5 decompression, instrumentation and fusion.  I have shown her surgical models.  I have given her a surgical pamphlet.  We have discussed the risk, benefits, alternatives, expected postop course, and likelihood of achieving our goals with surgery.  I have answered all the patient's, her husband, and son's questions.  She has decided proceed with surgery.   [name] 01/28/2024 9:20 AM

## 2024-01-28 NOTE — Progress Notes (Signed)
 Orthopedic Tech Progress Note Patient Details:  Stacy Frost 06/02/1961 [id number] Lso delivered to [facility] Ortho Devices Type of Ortho Device: Lumbar corsett Ortho Device/Splint Interventions: Ordered      Bella Kennedy A [name] 01/28/2024, 7:22 PM

## 2024-01-28 NOTE — Op Note (Signed)
 Brief history: The patient is a 63 year old white female who is complaining of back, bilateral buttock leg pain numbness tingling weakness consistent with neurogenic claudication.  She has failed medical management and was worked up with lumbar x-rays and a lumbar MRI which demonstrated a lumbar spine listhesis and severe spinal stenosis.  I discussed the various treatment options with her.  She has decided proceed with surgery.  Preoperative diagnosis: Lumbar spondylolisthesis, degenerative disc disease, spinal stenosis compressing the L2, L3, L4 and L5 nerve roots; lumbago; lumbar radiculopathy; neurogenic claudication  Postoperative diagnosis: The same  Procedure: Bilateral L2-3, L3-4 and L4-5 laminotomy/foraminotomies/medial facetectomy to decompress the bilateral L2, L3, L4 and L5 nerve roots(the work required to do this was in addition to the work required to do the posterior lumbar interbody fusion because of the patient's spinal stenosis, facet arthropathy. Etc. requiring a wide decompression of the nerve roots.); left L2-3, L3-4 and L4-5 transforaminal lumbar interbody fusion with local morselized autograft bone and Zimmer DBM; insertion of interbody prosthesis at L2-3, L3-4 and L4-5 (globus peek expandable interbody prosthesis); posterior segmental instrumentation from L2 to L5 with globus titanium pedicle screws and rods; posterior lateral arthrodesis at L2-3, L3-4 and L4-5 with local morselized autograft bone and Zimmer DBM.  Surgeon: Dr. Delma Officer  Asst.: Dr. Barnett Abu and Hildred Priest, NP  Anesthesia: Gen. endotracheal  Estimated blood loss: 400 cc  Drains: 1 medium Hemovac drain in the epidural space.  Complications: None  Description of procedure: The patient was brought to the operating room by the anesthesia team. General endotracheal anesthesia was induced. The patient was turned to the prone position on the Wilson frame. The patient's lumbosacral region was then  prepared with Betadine scrub and Betadine solution. Sterile drapes were applied.  I then injected the area to be incised with Marcaine with epinephrine solution. I then used the scalpel to make a linear midline incision over the L2-3, L3-4 and L4-5 interspace. I then used electrocautery to perform a bilateral subperiosteal dissection exposing the spinous process and lamina of L2-3, L3-4 and L4-5. We then obtained intraoperative radiograph to confirm our location. We then inserted the Verstrac retractor to provide exposure.  I began the decompression by using the high speed drill to perform laminotomies at L2-3, L3-4 and L4-5 bilaterally. We then used the Kerrison punches to widen the laminotomy and removed the ligamentum flavum at L2-3, L3-4 and L4-5 bilaterally. We used the Kerrison punches to remove the medial facets at L2-3, L3-4 and L4-5 bilaterally, we removed the left L2-3, L3-4 and L4-5 facet. We performed wide foraminotomies about the bilateral L2, L3, L4 and L5 nerve roots completing the decompression.  The spinous process at L3 and L4 fractured during the decompression so we cut the spinous ligament at L2-3, 3 4 and L4-5 and removed the spinous process at L3 and L4.  We now turned our attention to the posterior lumbar interbody fusion. I used a scalpel to incise the intervertebral disc at L2-3, 3 4 and L4-5 bilaterally. I then performed a partial intervertebral discectomy at L2-3, L3-4 and L4-5 bilaterally using the pituitary forceps. We prepared the vertebral endplates at L2-3, L3-4 and L4-5 bilaterally for the fusion by removing the soft tissues with the curettes. We then used the trial spacers to pick the appropriate sized interbody prosthesis. We prefilled his prosthesis with a combination of local morselized autograft bone that we obtained during the decompression as well as Zimmer DBM. We inserted the prefilled prosthesis into the interspace  at L2-3, L3-4 and L4-5 from the left, we then turned  and expanded the prosthesis. There was a good snug fit of the prosthesis in the interspace. We then filled and the remainder of the intervertebral disc space with local morselized autograft bone and Zimmer DBM. This completed the posterior lumbar interbody arthrodesis.  During the decompression and insertion of the prosthesis the assistant protected the thecal sac and nerve roots with the D'Errico retractor.  We now turned attention to the instrumentation. Under fluoroscopic guidance we cannulated the bilateral L2, L3, L4, and L5 pedicles with the bone probe. We then removed the bone probe. We then tapped the pedicles with a 6.5 millimeter tap. We then removed the tap. We probed inside the tapped pedicle with a ball probe to rule out cortical breaches. We then inserted a 7.5 x 50 millimeter pedicle screw into the 2, L3, L4 and L5 pedicles bilaterally under fluoroscopic guidance. We then palpated along the medial aspect of the pedicles to rule out cortical breaches. There were none. The nerve roots were not injured. We then connected the unilateral pedicle screws with a lordotic rod. We compressed the construct and secured the rod in place with the caps. We then tightened the caps appropriately.  We placed a cross connector between the rods and tightened it appropriately.  This completed the instrumentation from L2-L5 bilaterally.  We now turned our attention to the posterior lateral arthrodesis at L2-3, L3-4 and L4-5. We used the high-speed drill to decorticate the remainder of the facets, pars, transverse process at L2-3, L3-4 and L4-5. We then applied a combination of local morselized autograft bone and Zimmer DBM over these decorticated posterior lateral structures. This completed the posterior lateral arthrodesis.  We then obtained hemostasis using bipolar electrocautery. We irrigated the wound out with vashe solution. We inspected the thecal sac and nerve roots and noted they were well decompressed. We  then removed the retractor.  We injected Exparel .  We placed a medium Hemovac drain in the epidural space and tunneled it out through a separate stab wound.  We reapproximated patient's thoracolumbar fascia with interrupted #1 Vicryl suture. We reapproximated patient's subcutaneous tissue with interrupted 2-0 Vicryl suture. The reapproximated patient's skin with Steri-Strips and benzoin. The wound was then coated with bacitracin ointment. A sterile dressing was applied. The drapes were removed. The patient was subsequently returned to the supine position where they were extubated by the anesthesia team. He was then transported to the post anesthesia care unit in stable condition. All sponge instrument and needle counts were reportedly correct at the end of this case.

## 2024-01-28 NOTE — Transfer of Care (Signed)
 Immediate Anesthesia Transfer of Care Note  Patient: Stacy Frost  Procedure(s) Performed: POSTERIOR LUMBAR INTERBODY FUSION,INTERBODY PROTHESIS,POSTERIOR INSTRUMENTATION LUMBAR TWO-THREE, LUMBAR THREE-FOUR, LUMBAR FOUR-FIVE  Patient Location: PACU  Anesthesia Type:General  Level of Consciousness: awake, alert , and oriented  Airway & Oxygen Therapy: Patient Spontanous Breathing and Patient connected to face mask oxygen  Post-op Assessment: Report given to RN and Post -op Vital signs reviewed and stable  Post vital signs: Reviewed and stable  Last Vitals:  Vitals Value Taken Time  BP 104/68 01/28/24 1800  Temp 37.1 C 01/28/24 1754  Pulse 92 01/28/24 1805  Resp 23 01/28/24 1805  SpO2 93 % 01/28/24 1805  Vitals shown include unfiled device data.  Last Pain:  Vitals:   01/28/24 1754  TempSrc:   PainSc: Asleep         Complications: No notable events documented.

## 2024-01-28 NOTE — Anesthesia Procedure Notes (Signed)
 Procedure Name: Intubation Date/Time: 01/28/2024 10:35 AM  Performed by: Thomasene Ripple, CRNAPre-anesthesia Checklist: Patient identified, Emergency Drugs available, Suction available and Patient being monitored Patient Re-evaluated:Patient Re-evaluated prior to induction Oxygen Delivery Method: Circle System Utilized Preoxygenation: Pre-oxygenation with 100% oxygen Induction Type: IV induction Ventilation: Mask ventilation without difficulty Laryngoscope Size: Mac and 4 Grade View: Grade I Tube type: Oral Tube size: 7.0 mm Number of attempts: 1 Airway Equipment and Method: Stylet and Oral airway Placement Confirmation: ETT inserted through vocal cords under direct vision, positive ETCO2 and breath sounds checked- equal and bilateral Secured at: 21 cm Tube secured with: Tape Dental Injury: Teeth and Oropharynx as per pre-operative assessment

## 2024-01-28 NOTE — Anesthesia Postprocedure Evaluation (Signed)
 Anesthesia Post Note  Patient: Stacy Frost  Procedure(s) Performed: POSTERIOR LUMBAR INTERBODY FUSION,INTERBODY PROTHESIS,POSTERIOR INSTRUMENTATION LUMBAR TWO-THREE, LUMBAR THREE-FOUR, LUMBAR FOUR-FIVE     Patient location during evaluation: PACU Anesthesia Type: General Level of consciousness: awake Pain management: pain level controlled Vital Signs Assessment: post-procedure vital signs reviewed and stable Respiratory status: spontaneous breathing, nonlabored ventilation and respiratory function stable Cardiovascular status: blood pressure returned to baseline and stable Postop Assessment: no apparent nausea or vomiting Anesthetic complications: no   No notable events documented.  Last Vitals:  Vitals:   01/28/24 1930 01/28/24 1945  BP: (!) 148/78 (!) 154/81  Pulse: 90 92  Resp: (!) 25 (!) 25  Temp:    SpO2: 92% 93%    Last Pain:  Vitals:   01/28/24 1945  TempSrc:   PainSc: 0-No pain                 [name] [name]

## 2024-01-29 DIAGNOSIS — M4316 Spondylolisthesis, lumbar region: Secondary | ICD-10-CM | POA: Diagnosis not present

## 2024-01-29 DIAGNOSIS — M5416 Radiculopathy, lumbar region: Secondary | ICD-10-CM | POA: Diagnosis not present

## 2024-01-29 LAB — GLUCOSE, CAPILLARY
Glucose-Capillary: 184 mg/dL — ABNORMAL HIGH (ref 70–99)
Glucose-Capillary: 202 mg/dL — ABNORMAL HIGH (ref 70–99)
Glucose-Capillary: 162 mg/dL — ABNORMAL HIGH (ref 70–99)
Glucose-Capillary: 151 mg/dL — ABNORMAL HIGH (ref 70–99)

## 2024-01-29 MED FILL — Thrombin For Soln 5000 Unit: CUTANEOUS | Qty: 2 | Status: AC

## 2024-01-29 NOTE — Progress Notes (Signed)
 Subjective: The patient is alert and pleasant.  She looks and feels well.  Her husband is at the bedside.  Objective: Vital signs in last 24 hours: Temp:  [98.1 F (36.7 C)-99.1 F (37.3 C)] 99 F (37.2 C) (04/01 0602) Pulse Rate:  [87-102] 98 (04/01 0602) Resp:  [18-25] 20 (04/01 0602) BP: (104-157)/(63-82) 124/73 (04/01 0614) SpO2:  [91 %-99 %] 96 % (04/01 0602) Weight:  [87.1 kg] 87.1 kg (03/31 0808) Estimated body mass index is 30.99 kg/m as calculated from the following:   Height as of this encounter: 5\' 6"  (1.676 m).   Weight as of this encounter: 87.1 kg.   Intake/Output from previous day: 03/31 0701 - 04/01 0700 In: 2600 [I.V.:2100; IV Piggyback:500] Out: 2140 [Urine:1570; Drains:270; Blood:300] Intake/Output this shift: No intake/output data recorded.  Physical exam the patient is alert and oriented.  Her lower extremity strength is grossly normal.  Lab Results: No results for input(s): "WBC", "HGB", "HCT", "PLT" in the last 72 hours. BMET No results for input(s): "NA", "K", "CL", "CO2", "GLUCOSE", "BUN", "CREATININE", "CALCIUM" in the last 72 hours.  Studies/Results: DG Lumbar Spine 2-3 Views Result Date: 01/28/2024 CLINICAL DATA:  Lumbar fusion EXAM: LUMBAR SPINE - 2-3 VIEW COMPARISON:  01/28/2024 FINDINGS: Two fluoroscopic images are obtained during the performance of the procedure and are provided for interpretation only. Images demonstrate discectomies at L2-3, L3-4, and L4-5, with transpedicular screws at the L2, L3, L4, and L5 vertebral bodies. Alignment is grossly anatomic. Please refer to operative report. Fluoroscopy time: 24.0 seconds, 20.72 mGy IMPRESSION: 1. Intraoperative evaluation as above. Please refer to the operative report. Electronically Signed   By: Sharlet Salina M.D.   On: 01/28/2024 20:43   DG Lumbar Spine 1 View Result Date: 01/28/2024 CLINICAL DATA:  Posterior lumbar interbody fusion, intraoperative assessment and level assessment EXAM:  LUMBAR SPINE - 1 VIEW COMPARISON:  MRI lumbar spine 11/23/2023 FINDINGS: As on the lumbar MRI, the lowest fully segmental lumbar type non-rib-bearing vertebra is numbered L5. A lateral view of the lumbar spine with tissue spreader and sponge in place demonstrates a blunt tip probe oriented generally towards the L3-4 intervertebral space; the tip of the probe projects directly over the lower L3 spinous process. IMPRESSION: 1. L3-4 localization with blunt tip probe. Electronically Signed   By: Gaylyn Rong M.D.   On: 01/28/2024 20:02   DG C-Arm 1-60 Min-No Report Result Date: 01/28/2024 Fluoroscopy was utilized by the requesting physician.  No radiographic interpretation.   DG C-Arm 1-60 Min-No Report Result Date: 01/28/2024 Fluoroscopy was utilized by the requesting physician.  No radiographic interpretation.    Assessment/Plan: Postop day 1: The patient is doing well.  She might go home later on today after she works with PT.  I gave her discharge instructions and answered all their questions.  LOS: 1 day     Stacy Frost 01/29/2024, 7:33 AM     Patient ID: Stacy Frost, female   DOB: 18-Sep-1961, 63 y.o.   MRN: [id number]

## 2024-01-29 NOTE — PMR Pre-admission (Signed)
 PMR Admission Coordinator Pre-Admission Assessment  Patient: Stacy Frost is an 63 y.o., female MRN: [id number] DOB: 1960-12-18 Height: 5\' 6"  (167.6 cm) Weight: 87.1 kg              Insurance Information HMO: yes    PPO:      PCP:      IPA:      80/20:      OTHER:  PRIMARY:  Generic [employer]      Policy#: [id number]      Subscriber: patient CM Name: Stacy Frost      Phone#: [phone]     Fax#: [phone] Pre-Cert#: [id number] Received approval on 01/30/24. Pt approved from 01/30/24 to 02/06/24. Pt approved for 7 days.      Employer: Self Benefits:  Phone #: [phone]     Name: Verified Dolores Hoose Date: 04/29/2022- still active Deductible: $4,500 ($4,500 met) OOP Max: $8,550 ($8,550 met) CIR: 70% coverage; 30% coverage SNF: 70% coverage; 30% coverage; limited to 60 days/cal yr Outpatient:  50% coverage; 50% coverage Home Health:  50% coverage; 50% coverage DME: 50% coverage; 50% coverage; limited by medical necessity Providers: in network SECONDARY:       Policy#:       Phone#:   Artist:       Phone#:   The Engineer, materials Information Summary" for patients in Inpatient Rehabilitation Facilities with attached "Privacy Act Statement-Health Care Records" was provided and verbally reviewed with: Patient  Emergency Contact Information Contact Information     Name Relation Home Work Gantt Spouse [phone]  [phone]      Other Contacts     Name Relation Home Work Mobile   Borntreger,Josh Son [phone]  [phone]      Current Medical History  Patient Admitting Diagnosis: Lumbar Spondylosis  History of Present Illness: Stacy Frost is a 63 y.o. female with a history of DM, migraines, AS/MVP who developed increasing low back and bilateral buttock/leg pain consistent with neurogenic claudication. MRI revealed multi-level spondylolisthesis and stenosis compressing the L2, L3, L4, and L5 nerve roots. On 01/28/24 the patient elected to undergo bilateral  L2-3, L3-4 and L4-5 laminotomies/foraminotomies/medial facetectomies to decompress the L2-L5 nerve root as well as a PLIF L2-L5 by Dr. Lovell Sheehan. Procedure was performed at [facility] and pt. Remained on neurosurgery service post op, as she demonstrated significant functional deficits.  Pt has a lumbar corset which is to be donned in the sitting position. Pt lives in a one level home with her spouse with 1&1 steps to enter. Pt. Was seen by PT/OT post operatively and they recommend CIR to assist return to PLOF.       Patient's medical record from [facility] has been reviewed by the rehabilitation admission coordinator and physician.  Past Medical History  Past Medical History:  Diagnosis Date   Anxiety    Aortic stenosis    Mild to moderate   Bicuspid aortic valve    Essential hypertension, benign    GERD (gastroesophageal reflux disease)    Heart murmur    S/P Aortic Valve Replacement July 2024   History of cardiac catheterization    Normal coronaries 2008   Hyperlipidemia    Migraine headache    MVP (mitral valve prolapse)    Obesity    Pain in joint, ankle and foot    Chronic   Palpitations    Documented PACs by previous monitoring   Restless leg syndrome    Skin tag  Type 2 diabetes mellitus (HCC)    Unilateral primary osteoarthritis, right knee 09/06/2016    Has the patient had major surgery during 100 days prior to admission? Yes  Family History  family history includes Anxiety disorder in her daughter; Brain cancer in her paternal grandmother; Cancer in her mother; Depression in her daughter, father, and mother; Diabetes in her mother; Emphysema in her maternal grandfather; Heart attack in her father and paternal grandfather; Hypertension in her father; Stroke in her daughter; Transient ischemic attack in her brother.   Current Medications   Current Facility-Administered Medications:    0.9 %  sodium chloride infusion, 250 mL,  Intravenous, Continuous, Tressie Stalker, MD, Last Rate: 1 mL/hr at 01/28/24 2102, Restarted at 01/28/24 2102   acetaminophen (TYLENOL) tablet 650 mg, 650 mg, Oral, Q4H PRN **OR** acetaminophen (TYLENOL) suppository 650 mg, 650 mg, Rectal, Q4H PRN, Tressie Stalker, MD   acetaminophen (TYLENOL) tablet 1,000 mg, 1,000 mg, Oral, Q6H, Tressie Stalker, MD, 1,000 mg at 01/29/24 1223   atorvastatin (LIPITOR) tablet 80 mg, 80 mg, Oral, Daily, Tressie Stalker, MD, 80 mg at 01/29/24 9811   bisacodyl (DULCOLAX) suppository 10 mg, 10 mg, Rectal, Daily PRN, Tressie Stalker, MD   citalopram (CELEXA) tablet 40 mg, 40 mg, Oral, Daily, Tressie Stalker, MD, 40 mg at 01/29/24 0914   clonazePAM (KLONOPIN) tablet 0.5 mg, 0.5 mg, Oral, BID PRN, Tressie Stalker, MD   cyclobenzaprine (FLEXERIL) tablet 10 mg, 10 mg, Oral, TID PRN, Tressie Stalker, MD, 10 mg at 01/28/24 2324   docusate sodium (COLACE) capsule 100 mg, 100 mg, Oral, BID, Tressie Stalker, MD, 100 mg at 01/29/24 9147   ferrous sulfate tablet 325 mg, 325 mg, Oral, Q breakfast, Tressie Stalker, MD, 325 mg at 01/29/24 8295   furosemide (LASIX) tablet 40 mg, 40 mg, Oral, Daily, Tressie Stalker, MD, 40 mg at 01/29/24 0914   gabapentin (NEURONTIN) capsule 100 mg, 100 mg, Oral, Daily, Tressie Stalker, MD, 100 mg at 01/29/24 1000   gabapentin (NEURONTIN) capsule 200 mg, 200 mg, Oral, QHS, Tressie Stalker, MD, 200 mg at 01/28/24 2123   HYDROmorphone (DILAUDID) injection 0.5-1 mg, 0.5-1 mg, Intravenous, Q2H PRN, Tressie Stalker, MD   insulin aspart (novoLOG) injection 0-15 Units, 0-15 Units, Subcutaneous, TID WC, Tressie Stalker, MD, 5 Units at 01/29/24 1224   insulin aspart (novoLOG) injection 0-5 Units, 0-5 Units, Subcutaneous, QHS, Tressie Stalker, MD   menthol-cetylpyridinium (CEPACOL) lozenge 3 mg, 1 lozenge, Oral, PRN **OR** phenol (CHLORASEPTIC) mouth spray 1 spray, 1 spray, Mouth/Throat, PRN, Tressie Stalker, MD   metFORMIN (GLUCOPHAGE-XR) 24 hr  tablet 1,000 mg, 1,000 mg, Oral, Q breakfast, Tressie Stalker, MD, 1,000 mg at 01/29/24 0915   metoprolol succinate (TOPROL-XL) 24 hr tablet 25 mg, 25 mg, Oral, BID, Tressie Stalker, MD, 25 mg at 01/29/24 0914   ondansetron (ZOFRAN) tablet 4 mg, 4 mg, Oral, Q6H PRN **OR** ondansetron (ZOFRAN) injection 4 mg, 4 mg, Intravenous, Q6H PRN, Tressie Stalker, MD, 4 mg at 01/28/24 2107   oxyCODONE (Oxy IR/ROXICODONE) immediate release tablet 10 mg, 10 mg, Oral, Q3H PRN, Tressie Stalker, MD, 10 mg at 01/29/24 1223   oxyCODONE (Oxy IR/ROXICODONE) immediate release tablet 5 mg, 5 mg, Oral, Q3H PRN, Tressie Stalker, MD   potassium chloride SA (KLOR-CON M) CR tablet 20 mEq, 20 mEq, Oral, Daily, Tressie Stalker, MD, 20 mEq at 01/29/24 0915   pramipexole (MIRAPEX) tablet 1 mg, 1 mg, Oral, QHS, Tressie Stalker, MD, 1 mg at 01/28/24 2123   sodium chloride flush (NS) 0.9 %  injection 3 mL, 3 mL, Intravenous, Q12H, Tressie Stalker, MD, 3 mL at 01/29/24 0918   sodium chloride flush (NS) 0.9 % injection 3 mL, 3 mL, Intravenous, PRN, Tressie Stalker, MD   zolpidem [facility]) tablet 5 mg, 5 mg, Oral, QHS PRN, Tressie Stalker, MD  Patients Current Diet:  Diet Order             Diet Carb Modified Fluid consistency: Thin; Room service appropriate? Yes  Diet effective now                   Precautions / Restrictions Precautions Precautions: Back Precaution Booklet Issued: Yes (comment) Precaution/Restrictions Comments: Reviewed precautions verbally during functional mobility. Spinal Brace: Lumbar corset, Applied in sitting position Restrictions Weight Bearing Restrictions Per Provider Order: No   Has the patient had 2 or more falls or a fall with injury in the past year?Yes  Prior Activity Level Community (5-7x/wk): Pt. active in the community  Prior Functional Level Prior Function Prior Level of Function : Independent/Modified Independent  Self Care: Did the patient need help bathing,  dressing, using the toilet or eating?  Independent  Indoor Mobility: Did the patient need assistance with walking from room to room (with or without device)? Independent  Stairs: Did the patient need assistance with internal or external stairs (with or without device)? Independent  Functional Cognition: Did the patient need help planning regular tasks such as shopping or remembering to take medications? Independent  Patient Information Are you of Hispanic, Latino/a,or Spanish origin?: A. No, not of Hispanic, Latino/a, or Spanish origin What is your race?: A. White Do you need or want an interpreter to communicate with a doctor or health care staff?: 0. No  Patient's Response To:  Health Literacy and Transportation Is the patient able to respond to health literacy and transportation needs?: Yes Health Literacy - How often do you need to have someone help you when you read instructions, pamphlets, or other written material from your doctor or pharmacy?: Never In the past 12 months, has lack of transportation kept you from medical appointments or from getting medications?: No In the past 12 months, has lack of transportation kept you from meetings, work, or from getting things needed for daily living?: No  Journalist, newspaper / Equipment Home Equipment: Information systems manager, BSC/3in1, Rollator (4 wheels)  Prior Device Use: Indicate devices/aids used by the patient prior to current illness, exacerbation or injury? Walker  Current Functional Level Cognition  Orientation Level: Oriented X4    Extremity Assessment (includes Sensation/Coordination)  Upper Extremity Assessment: Defer to OT evaluation  Lower Extremity Assessment: RLE deficits/detail, LLE deficits/detail RLE Deficits / Details: Decreased strength and sensation in lower leg post-op. Pt reports no sensation on top of foot to light touch, and decreased sensation up to knee. 4-/5 strength grossly. LLE Deficits / Details: Significantly  decreased sensation at knee, and no sensation from below knee down to foot. 2/5 strength in quads, ankle DF.    ADLs  Overall ADL's : Needs assistance/impaired Eating/Feeding: Independent, Sitting Grooming: Wash/dry hands, Wash/dry face, Set up, Sitting Upper Body Bathing: Set up, Sitting Lower Body Bathing: Moderate assistance, Sitting/lateral leans Upper Body Dressing : Set up, Sitting Lower Body Dressing: Moderate assistance, Sitting/lateral leans Toilet Transfer: Moderate assistance, Transfer board, BSC/3in1    Mobility  Overal bed mobility: Needs Assistance Bed Mobility: Sit to Sidelying, Rolling Rolling: Contact guard assist Sidelying to sit: Min assist Sit to sidelying: Mod assist General bed mobility comments: Assist for LE  elevation back up to bed at end of session and for repositioning in bed.    Transfers  Overall transfer level: Needs assistance Equipment used: Rolling walker (2 wheels) Transfers: Sit to/from Stand Sit to Stand: Contact guard assist, Via lift equipment, From elevated surface Transfer via Lift Equipment: Stedy General transfer comment: Stedy utilized for sit>stand due to L knee buckling. In standing, pt with decreased ability to demonstrate quad activation and TKE.    Ambulation / Gait / Stairs / Wheelchair Mobility  Ambulation/Gait Pre-gait activities: In standing within the frame of the [place], pt attempted terminal knee extension and quad activation. Pt tolerated ~10 minutes total of standing (with seated rest breaks intermittently), and pt was able to engage quad 3 separate times. Able to shift weight to the R, but unable to demonstrate heel raise on the L.    Posture / Balance Balance Overall balance assessment: Needs assistance Sitting-balance support: Feet supported Sitting balance-Leahy Scale: Good Standing balance support: Reliant on assistive device for balance Standing balance-Leahy Scale: Zero Standing balance comment: unable to maintain  stand without support to L knee    Special needs/care consideration Skin surgical incision  and Special service needs corset brace     Previous Home Environment (from acute therapy documentation) Living Arrangements: Spouse/significant other, Children Available Help at Discharge: Family Type of Home: House Home Layout: One level Home Access: Stairs to enter Entrance Stairs-Rails: None Entrance Stairs-Number of Steps: 1&1 Bathroom Shower/Tub: Armed forces operational officer Accessibility: No  Discharge Living Setting Plans for Discharge Living Setting: House Type of Home at Discharge: House Discharge Home Layout: One level Discharge Home Access: Stairs to enter Entrance Stairs-Rails: None Entrance Stairs-Number of Steps: 2 Discharge Bathroom Shower/Tub: Tub/shower unit Discharge Bathroom Toilet: Standard Discharge Bathroom Accessibility: Yes How Accessible: Accessible via walker  Social/Family/Support Systems Patient Roles: Spouse Contact Information: [phone] Anticipated Caregiver: Carmelina Paddock Caregiver Availability: 24/7 Discharge Plan Discussed with Primary Caregiver: Yes Is Caregiver In Agreement with Plan?: Yes Does Caregiver/Family have Issues with Lodging/Transportation while Pt is in Rehab?: No   Goals Patient/Family Goal for Rehab: PT/OT Mod I Expected length of stay: 9-14 days Pt/Family Agrees to Admission and willing to participate: Yes Program Orientation Provided & Reviewed with Pt/Caregiver Including Roles  & Responsibilities: Yes   Decrease burden of Care through IP rehab admission: not anticipated   Possible need for SNF placement upon discharge:not anticipated  Patient Condition: This patient's medical and functional status has changed since the consult dated: 01/29/24 in which the Rehabilitation Physician determined and documented that the patient's condition is appropriate for intensive rehabilitative care in an inpatient  rehabilitation facility. See "History of Present Illness" (above) for medical update. Functional changes are: Currently requiring min to mod assist. Patient's medical and functional status update has been discussed with the Rehabilitation physician and patient remains appropriate for inpatient rehabilitation. Will admit to inpatient rehab today.  Preadmission Screen Completed By:  Jeronimo Greaves, CCC-SLP, 01/29/2024 3:07 PM ______________________________________________________________________   Discussed status with Dr. Riley Kill on 02/01/24 at 9:32 AM and received approval for admission today.  Admission Coordinator:  Jeronimo Greaves, time 9:32 AM/Date 02/01/24

## 2024-01-29 NOTE — Evaluation (Signed)
 Occupational Therapy Evaluation Patient Details Name: Stacy Frost MRN: [id number] DOB: 10/18/1961 Today's Date: 01/29/2024   History of Present Illness   63 yo F adm for scheduled PLIF.  PMH includes: recent CABG, DM, GERD, HTN, HLD.     Clinical Impressions Patient admitted for the procedure above.  PTA she lived at home with her spouse, used a 4WRW for mobility, and was able to perform her own ADL, light iADL and participate with meal prep.  Spouse just retired, and can provide assist as needed.  Primary deficit is L leg weakness, significant change from baseline, and increased numbness to L leg from baseline.  Patient is currently unable to recruit L quad to fully stand, and is unable to take steps.  All of which are significant changes from baseline.  OT will continue efforts in the acute setting to address deficits, and given age, prior level of function and determination to improve, Patient will benefit from intensive inpatient follow-up therapy, >3 hours/day.  Patient has a very good chance to achieve a Mod I level prior to returning home.       If plan is discharge home, recommend the following:   A lot of help with bathing/dressing/bathroom;A lot of help with walking and/or transfers;Assist for transportation;Assistance with cooking/housework;Help with stairs or ramp for entrance     Functional Status Assessment   Patient has had a recent decline in their functional status and demonstrates the ability to make significant improvements in function in a reasonable and predictable amount of time.     Equipment Recommendations   Tub/shower bench     Recommendations for Other Services         Precautions/Restrictions   Precautions Precautions: Back Precaution Booklet Issued: Yes (comment) Recall of Precautions/Restrictions: Intact Required Braces or Orthoses: Spinal Brace Spinal Brace: Lumbar corset;Applied in sitting position Restrictions Weight Bearing Restrictions  Per Provider Order: No     Mobility Bed Mobility Overal bed mobility: Needs Assistance Bed Mobility: Sidelying to Sit, Sit to Sidelying   Sidelying to sit: Min assist     Sit to sidelying: Mod assist      Transfers Overall transfer level: Needs assistance Equipment used: Rolling walker (2 wheels) Transfers: Sit to/from Stand Sit to Stand: Mod assist           General transfer comment: unable to take steps due to LLE weakness      Balance Overall balance assessment: Needs assistance Sitting-balance support: Feet supported Sitting balance-Leahy Scale: Good     Standing balance support: Reliant on assistive device for balance Standing balance-Leahy Scale: Zero Standing balance comment: unable to maintain stand without support to L knee                           ADL either performed or assessed with clinical judgement   ADL Overall ADL's : Needs assistance/impaired Eating/Feeding: Independent;Sitting   Grooming: Wash/dry hands;Wash/dry face;Set up;Sitting   Upper Body Bathing: Set up;Sitting   Lower Body Bathing: Moderate assistance;Sitting/lateral leans   Upper Body Dressing : Set up;Sitting   Lower Body Dressing: Moderate assistance;Sitting/lateral leans   Toilet Transfer: Moderate assistance;Transfer board;BSC/3in1                   Vision Patient Visual Report: No change from baseline       Perception Perception: Not tested       Praxis Praxis: Not tested       Pertinent Vitals/Pain Pain  Assessment Pain Assessment: Faces Faces Pain Scale: Hurts a little bit Pain Location: Incisional Pain Descriptors / Indicators: Sore Pain Intervention(s): Monitored during session     Extremity/Trunk Assessment Upper Extremity Assessment Upper Extremity Assessment: Overall WFL for tasks assessed   Lower Extremity Assessment Lower Extremity Assessment: Defer to PT evaluation   Cervical / Trunk Assessment Cervical / Trunk Assessment:  Back Surgery   Communication Communication Communication: No apparent difficulties   Cognition Arousal: Alert Behavior During Therapy: WFL for tasks assessed/performed Cognition: No apparent impairments                               Following commands: Intact       Cueing  General Comments   Cueing Techniques: Verbal cues   VSS on RA   Exercises     Shoulder Instructions      Home Living Family/patient expects to be discharged to:: Private residence Living Arrangements: Spouse/significant other;Children Available Help at Discharge: Family Type of Home: House Home Access: Stairs to enter Secretary/administrator of Steps: 1&1 Entrance Stairs-Rails: None Home Layout: One level     Bathroom Shower/Tub: Chief Strategy Officer: Standard Bathroom Accessibility: No   Home Equipment: Administrator (4 wheels)          Prior Functioning/Environment Prior Level of Function : Independent/Modified Independent                    OT Problem List: Decreased strength;Decreased range of motion;Decreased activity tolerance;Impaired balance (sitting and/or standing)   OT Treatment/Interventions: Self-care/ADL training;Therapeutic activities;Patient/family education;DME and/or AE instruction;Balance training      OT Goals(Current goals can be found in the care plan section)   Acute Rehab OT Goals Patient Stated Goal: Return home OT Goal Formulation: With patient Time For Goal Achievement: 02/12/24 Potential to Achieve Goals: Good ADL Goals Pt Will Perform Lower Body Dressing: with set-up;sitting/lateral leans Pt Will Transfer to Toilet: with supervision;with transfer board;bedside commode   OT Frequency:  Min 2X/week    Co-evaluation              AM-PAC OT "6 Clicks" Daily Activity     Outcome Measure Help from another person eating meals?: None Help from another person taking care of personal grooming?:  None Help from another person toileting, which includes using toliet, bedpan, or urinal?: A Lot Help from another person bathing (including washing, rinsing, drying)?: A Lot Help from another person to put on and taking off regular upper body clothing?: None Help from another person to put on and taking off regular lower body clothing?: A Lot 6 Click Score: 18   End of Session Equipment Utilized During Treatment: Rolling walker (2 wheels) Nurse Communication: Mobility status  Activity Tolerance: Patient tolerated treatment well Patient left: in bed;with call bell/phone within reach;with family/visitor present  OT Visit Diagnosis: Unsteadiness on feet (R26.81);Muscle weakness (generalized) (M62.81)                Time: 0981-1914 OT Time Calculation (min): 30 min Charges:  OT General Charges $OT Visit: 1 Visit OT Evaluation $OT Eval Moderate Complexity: 1 Mod OT Treatments $Self Care/Home Management : 8-22 mins  01/29/2024  RP, OTR/L  Acute Rehabilitation Services  Office:  [phone]   Suzanna Obey 01/29/2024, 8:53 AM

## 2024-01-29 NOTE — Evaluation (Signed)
 Physical Therapy Evaluation  Patient Details Name: Stacy Frost MRN: [id number] DOB: May 29, 1961 Today's Date: 01/29/2024  History of Present Illness  Pt is a 63 y/o female who presents s/p L2-L5 PLIF on  01/28/2024. Pt with post-op weakness and decreased sensation in BLE's. PMH includes: recent CABG, DM, HTN.  Clinical Impression  Pt admitted with above diagnosis. At the time of PT eval, pt was able to demonstrate transfers with assist within the frame of the [facility] for knee block. Pt unable to stand without L knee block. In standing within the frame of the [place], pt attempted terminal knee extension and quad activation. Pt tolerated ~10 minutes total of standing (with seated rest breaks intermittently), and pt was able to engage quad 3 separate times. Pt was educated on precautions, brace application/wearing schedule, and safety with mobility at this time. Patient will benefit from intensive inpatient follow-up therapy, >3 hours/day. Pt currently with functional limitations due to the deficits listed below (see PT Problem List). Pt will benefit from skilled PT to increase their independence and safety with mobility to allow discharge to the venue listed below.          If plan is discharge home, recommend the following: A lot of help with walking and/or transfers;A lot of help with bathing/dressing/bathroom;Assistance with cooking/housework;Assist for transportation;Help with stairs or ramp for entrance   Can travel by private vehicle        Equipment Recommendations Rolling walker (2 wheels)  Recommendations for Other Services       Functional Status Assessment Patient has had a recent decline in their functional status and demonstrates the ability to make significant improvements in function in a reasonable and predictable amount of time.     Precautions / Restrictions Precautions Precautions: Back Precaution Booklet Issued: Yes (comment) Recall of Precautions/Restrictions:  Intact Precaution/Restrictions Comments: Reviewed precautions verbally during functional mobility. Required Braces or Orthoses: Spinal Brace Spinal Brace: Lumbar corset;Applied in sitting position Restrictions Weight Bearing Restrictions Per Provider Order: No      Mobility  Bed Mobility Overal bed mobility: Needs Assistance Bed Mobility: Sit to Sidelying, Rolling Rolling: Contact guard assist       Sit to sidelying: Mod assist General bed mobility comments: Assist for LE elevation back up to bed at end of session and for repositioning in bed.    Transfers Overall transfer level: Needs assistance Equipment used: Rolling walker (2 wheels) Transfers: Sit to/from Stand Sit to Stand: Contact guard assist, Via lift equipment, From elevated surface           General transfer comment: Antony Salmon utilized for sit>stand due to L knee buckling. In standing, pt with decreased ability to demonstrate quad activation and TKE. Transfer via Lift Equipment: Stedy  Ambulation/Gait             Pre-gait activities: In standing within the frame of the [place], pt attempted terminal knee extension and quad activation. Pt tolerated ~10 minutes total of standing (with seated rest breaks intermittently), and pt was able to engage quad 3 separate times. Able to shift weight to the R, but unable to demonstrate heel raise on the L.    Stairs            Wheelchair Mobility     Tilt Bed    Modified Rankin (Stroke Patients Only)       Balance Overall balance assessment: Needs assistance Sitting-balance support: Feet supported Sitting balance-Leahy Scale: Good     Standing balance support: Reliant on assistive device  for balance Standing balance-Leahy Scale: Zero Standing balance comment: unable to maintain stand without support to L knee                             Pertinent Vitals/Pain Pain Assessment Pain Assessment: Faces Faces Pain Scale: Hurts a little bit Pain  Location: Incisional Pain Descriptors / Indicators: Sore Pain Intervention(s): Limited activity within patient's tolerance, Monitored during session, Repositioned    Home Living Family/patient expects to be discharged to:: Private residence Living Arrangements: Spouse/significant other;Children Available Help at Discharge: Family Type of Home: House Home Access: Stairs to enter Entrance Stairs-Rails: None Secretary/administrator of Steps: 1&1   Home Layout: One level Home Equipment: Administrator (4 wheels)      Prior Function Prior Level of Function : Independent/Modified Independent                     Extremity/Trunk Assessment   Upper Extremity Assessment Upper Extremity Assessment: Defer to OT evaluation    Lower Extremity Assessment Lower Extremity Assessment: RLE deficits/detail;LLE deficits/detail RLE Deficits / Details: Decreased strength and sensation in lower leg post-op. Pt reports no sensation on top of foot to light touch, and decreased sensation up to knee. 4-/5 strength grossly. LLE Deficits / Details: Significantly decreased sensation at knee, and no sensation from below knee down to foot. 2/5 strength in quads, ankle DF.    Cervical / Trunk Assessment Cervical / Trunk Assessment: Back Surgery  Communication   Communication Communication: No apparent difficulties    Cognition Arousal: Alert Behavior During Therapy: WFL for tasks assessed/performed   PT - Cognitive impairments: No apparent impairments                         Following commands: Intact       Cueing Cueing Techniques: Verbal cues     General Comments      Exercises     Assessment/Plan    PT Assessment Patient needs continued PT services  PT Problem List Decreased strength;Decreased activity tolerance;Decreased balance;Decreased mobility;Decreased knowledge of use of DME;Decreased safety awareness;Decreased knowledge of precautions;Pain        PT Treatment Interventions DME instruction;Gait training;Stair training;Functional mobility training;Therapeutic activities;Therapeutic exercise;Balance training;Neuromuscular re-education;Patient/family education    PT Goals (Current goals can be found in the Care Plan section)  Acute Rehab PT Goals Patient Stated Goal: Be able to walk PT Goal Formulation: With patient/family Time For Goal Achievement: 02/12/24 Potential to Achieve Goals: Good    Frequency Min 5X/week     Co-evaluation               AM-PAC PT "6 Clicks" Mobility  Outcome Measure Help needed turning from your back to your side while in a flat bed without using bedrails?: A Little Help needed moving from lying on your back to sitting on the side of a flat bed without using bedrails?: A Lot Help needed moving to and from a bed to a chair (including a wheelchair)?: A Lot Help needed standing up from a chair using your arms (e.g., wheelchair or bedside chair)?: Total Help needed to walk in hospital room?: Total Help needed climbing 3-5 steps with a railing? : Total 6 Click Score: 10    End of Session Equipment Utilized During Treatment: Gait belt;Back brace Activity Tolerance: Patient tolerated treatment well Patient left: in bed;with call bell/phone within reach;with family/visitor present Nurse Communication: Mobility status  PT Visit Diagnosis: Unsteadiness on feet (R26.81);Pain;Difficulty in walking, not elsewhere classified (R26.2);Other symptoms and signs involving the nervous system (R29.898) Pain - part of body:  (back)    Time: 8119-1478 PT Time Calculation (min) (ACUTE ONLY): 29 min   Charges:   PT Evaluation $PT Eval Moderate Complexity: 1 Mod PT Treatments $Gait Training: 8-22 mins PT General Charges $$ ACUTE PT VISIT: 1 Visit         Conni Slipper, PT, DPT Acute Rehabilitation Services Secure Chat Preferred Office: [phone]   Marylynn Pearson 01/29/2024, 11:21 AM

## 2024-01-29 NOTE — Progress Notes (Signed)

## 2024-01-29 NOTE — Progress Notes (Addendum)
 Inpatient Rehab Admissions Coordinator:   I met with pt. To discuss potential CIR admit. She is interested and states her husband can provide 24/7 support,. I will confirm with him and send case to insurance.   Megan Salon, MS, CCC-SLP Rehab Admissions Coordinator  [phone] (celll) [phone] (office)

## 2024-01-29 NOTE — Consult Note (Signed)
 Physical Medicine and Rehabilitation Consult Reason for Consult:impaired functional mobility Referring Physician: Lovell Sheehan   HPI: Stacy Frost is a 63 y.o. female with a history of DM, migraines, AS/MVP who developed increasing low back and bilateral buttock/leg pain consistent with neurogenic claudication. MRI revealed multi-level spondylolisthesis and stenosis compressing the L2, L3, L4, and L5 nerve roots. On 01/28/24 the patient elected to undergo bilateral L2-3, L3-4 and L4-5 laminotomies/foraminotomies/medial facetectomies to decompress the L2-L5 nerve root as well as a PLIF L2-L5 by Dr. Lovell Sheehan. Pt was up with therapies today and was noted to have difficulty with knee extension and control of lower extremities which impacted standing in the standing frame . She was CGA for sit-stand transfers using the [facility] but demonstrated significant knee buckling and poor quad activation. Pt has a lumbar corset which is to be donned in the sitting position. Pt lives in a one level home with her spouse with 1&1 steps to enter.   Review of Systems  Constitutional:  Positive for malaise/fatigue.  HENT: Negative.    Eyes: Negative.   Respiratory: Negative.    Cardiovascular: Negative.   Gastrointestinal: Negative.   Genitourinary:  Negative for frequency and urgency.  Skin: Negative.   Neurological:  Positive for sensory change, focal weakness and weakness.  Psychiatric/Behavioral: Negative.     Past Medical History:  Diagnosis Date   Anxiety    Aortic stenosis    Mild to moderate   Bicuspid aortic valve    Essential hypertension, benign    GERD (gastroesophageal reflux disease)    Heart murmur    S/P Aortic Valve Replacement July 2024   History of cardiac catheterization    Normal coronaries 2008   Hyperlipidemia    Migraine headache    MVP (mitral valve prolapse)    Obesity    Pain in joint, ankle and foot    Chronic   Palpitations    Documented PACs by previous monitoring    Restless leg syndrome    Skin tag    Type 2 diabetes mellitus (HCC)    Unilateral primary osteoarthritis, right knee 09/06/2016   Past Surgical History:  Procedure Laterality Date   ACHILLES TENDON REPAIR  2003   Left   AORTIC VALVE REPLACEMENT N/A 05/14/2023   Procedure: AORTIC VALVE REPLACEMENT (AVR);  Surgeon: Corliss Skains, MD;  Location: [facility] OR;  Service: Open Heart Surgery;  Laterality: N/A;   CARDIAC CATHETERIZATION  2008   Normal coronary arteries; normal LV systolic function; mild dilation of aortic root; mild dilation of proximal ascending aorta; likely bicuspid aortic valve   CHOLECYSTECTOMY  2001   "Poor EF at 17%"   COLONOSCOPY N/A 07/17/2014   Procedure: COLONOSCOPY;  Surgeon: Malissa Hippo, MD;  Location: [facility] ENDO SUITE;  Service: Endoscopy;  Laterality: N/A;  105   ESOPHAGEAL DILATION N/A 06/11/2015   Procedure: ESOPHAGEAL DILATION;  Surgeon: Malissa Hippo, MD;  Location: [facility] ORS;  Service: Endoscopy;  Laterality: N/AElease Hashimoto 54/56/58   ESOPHAGOGASTRODUODENOSCOPY N/A 07/17/2014   Procedure: ESOPHAGOGASTRODUODENOSCOPY (EGD);  Surgeon: Malissa Hippo, MD;  Location: [facility] END[facility] SUITE;  Service: Endoscopy;  Laterality: N/A;   ESOPHAGOGASTRODUODENOSCOPY (EGD) WITH PROPOFOL N/A 06/11/2015   Procedure: ESOPHAGOGASTRODUODENOSCOPY (EGD) WITH PROPOFOL;  Surgeon: Malissa Hippo, MD;  Location: [facility] ORS;  Service: Endoscopy;  Laterality: N/A;  730   KNEE ARTHROSCOPY WITH MEDIAL MENISECTOMY Right 07/02/2018   Procedure: RIGHT KNEE ARTHROSCOPY, DEBRIDEMENT, MEDIAL MENISECTOMY;  Surgeon: [name], MD;  Location:  [facility] OR;  Service: Orthopedics;  Laterality: Right;   KNEE SURGERY     MALONEY DILATION N/A 07/17/2014   Procedure: MALONEY DILATION;  Surgeon: Malissa Hippo, MD;  Location: [facility] SUITE;  Service: Endoscopy;  Laterality: N/A;   RIGHT/LEFT HEART CATH AND CORONARY ANGIOGRAPHY N/A 05/02/2023   Procedure: RIGHT/LEFT HEART CATH AND CORONARY ANGIOGRAPHY;  Surgeon:  [place], Peter M, MD;  Location: [facility] INVASIVE CV LAB;  Service: Cardiovascular;  Laterality: N/A;   TEE WITHOUT CARDIOVERSION N/A 05/14/2023   Procedure: TRANSESOPHAGEAL ECHOCARDIOGRAM;  Surgeon: Corliss Skains, MD;  Location: [facility] OR;  Service: Open Heart Surgery;  Laterality: N/A;   UMBILICAL HERNIA REPAIR  2003   Family History  Problem Relation Age of Onset   Depression Mother    Cancer Mother        Lung mets (unsure as to what kind)   Diabetes Mother    Hypertension Father    Depression Father    Heart attack Father    Depression Daughter    Anxiety disorder Daughter    Stroke Daughter    Transient ischemic attack Brother    Emphysema Maternal Grandfather    Brain cancer Paternal Grandmother    Heart attack Paternal Grandfather    Social History:  reports that she has never smoked. She has never used smokeless tobacco. She reports that she does not drink alcohol and does not use drugs. Allergies:  Allergies  Allergen Reactions   Amlodipine Besylate Shortness Of Breath   Morphine And Codeine Shortness Of Breath and Other (See Comments)    Chest pain   Amitriptyline Other (See Comments)    Unknown reaction   Butorphanol Tartrate Other (See Comments)    REACTION: ED visit and got for migraine - not sure of response   Compazine [Prochlorperazine Edisylate] Other (See Comments)    Altered mental status   Rocephin [Ceftriaxone Sodium In Dextrose]     Given at GYN for infection - nauseated, dizziness (w/in last 7-8 years)   Sumatriptan Other (See Comments)    REACTION: HTN   Medications Prior to Admission  Medication Sig Dispense Refill   acetaminophen-codeine (TYLENOL #3) 300-30 MG tablet Take 1 tablet by mouth 2 (two) times daily as needed for moderate pain (pain score 4-6) (migraines).     atorvastatin (LIPITOR) 80 MG tablet TAKE 1 TABLET BY MOUTH ONCE DAILY 90 tablet 3   celecoxib (CELEBREX) 200 MG capsule Take 200 mg by mouth 2 (two) times daily.     citalopram  (CELEXA) 40 MG tablet Take 40 mg by mouth daily.     cyclobenzaprine (FLEXERIL) 10 MG tablet Take 10 mg by mouth 3 (three) times daily as needed for muscle spasms.     ferrous sulfate 325 (65 FE) MG tablet Take 1 tablet (325 mg total) by mouth daily with breakfast. 60 tablet 1   furosemide (LASIX) 40 MG tablet Take 0.5 tablets (20 mg total) by mouth daily as needed (for swelling and weight gain). (Patient taking differently: Take 40 mg by mouth daily.) 30 tablet 5   gabapentin (NEURONTIN) 100 MG capsule Take 100 mg by mouth 3 (three) times daily. Take 100mg  (1 capsule) by mouth every morning and 200mg  (2 capsules) in the evening.     insulin glargine-yfgn (SEMGLEE, YFGN,) 100 UNIT/ML Pen Inject 30 Units into the skin at bedtime. (Patient taking differently: Inject 40 Units into the skin at bedtime.) 27 mL 3   metFORMIN (GLUCOPHAGE-XR) 500 MG 24 hr tablet TAKE  2 TABLETS BY MOUTH DAILY WITH BREAKFAST 180 tablet 1   metoprolol succinate (TOPROL-XL) 25 MG 24 hr tablet Take 1 tablet (25 mg total) by mouth 2 (two) times daily at 10 AM and 5 PM. Take with or immediately following a meal.     MOUNJARO 7.5 MG/0.5ML Pen INJECT 7.5 MG UNDER THE SKIN WEEKLY 6 mL 1   potassium chloride SA (KLOR-CON M) 20 MEQ tablet TAKE 1 TABLET BY MOUTH DAILY 30 tablet 0   pramipexole (MIRAPEX) 0.5 MG tablet Take 1 mg by mouth at bedtime.     promethazine (PHENERGAN) 25 MG tablet Take 25 mg by mouth 2 (two) times daily as needed.     traZODone (DESYREL) 150 MG tablet Take 150 mg by mouth at bedtime.     aspirin EC 325 MG tablet Take 1 tablet (325 mg total) by mouth daily.     clonazePAM (KLONOPIN) 0.5 MG tablet Take 0.5 mg by mouth 2 (two) times daily as needed for anxiety.     Continuous Blood Gluc Receiver (DEXCOM G7 RECEIVER) DEVI USE 1 receiver continuously     Continuous Blood Gluc Sensor (DEXCOM G7 SENSOR) MISC APPLY 1 SENSOR EVERY 10 DAYS      Home: Home Living Family/patient expects to be discharged to:: Private  residence Living Arrangements: Spouse/significant other, Children Available Help at Discharge: Family Type of Home: House Home Access: Stairs to enter Secretary/administrator of Steps: 1&1 Entrance Stairs-Rails: None Home Layout: One level Bathroom Shower/Tub: Engineer, manufacturing systems: Standard Bathroom Accessibility: No Home Equipment: Information systems manager, BSC/3in1, Rollator (4 wheels)  Functional History: Prior Function Prior Level of Function : Independent/Modified Independent Functional Status:  Mobility: Bed Mobility Overal bed mobility: Needs Assistance Bed Mobility: Sit to Sidelying, Rolling Rolling: Contact guard assist Sidelying to sit: Min assist Sit to sidelying: Mod assist General bed mobility comments: Assist for LE elevation back up to bed at end of session and for repositioning in bed. Transfers Overall transfer level: Needs assistance Equipment used: Rolling walker (2 wheels) Transfers: Sit to/from Stand Sit to Stand: Contact guard assist, Via lift equipment, From elevated surface Transfer via Lift Equipment: Stedy General transfer comment: Stedy utilized for sit>stand due to L knee buckling. In standing, pt with decreased ability to demonstrate quad activation and TKE. Ambulation/Gait Pre-gait activities: In standing within the frame of the [place], pt attempted terminal knee extension and quad activation. Pt tolerated ~10 minutes total of standing (with seated rest breaks intermittently), and pt was able to engage quad 3 separate times. Able to shift weight to the R, but unable to demonstrate heel raise on the L.    ADL: ADL Overall ADL's : Needs assistance/impaired Eating/Feeding: Independent, Sitting Grooming: Wash/dry hands, Wash/dry face, Set up, Sitting Upper Body Bathing: Set up, Sitting Lower Body Bathing: Moderate assistance, Sitting/lateral leans Upper Body Dressing : Set up, Sitting Lower Body Dressing: Moderate assistance, Sitting/lateral  leans Toilet Transfer: Moderate assistance, Transfer board, BSC/3in1  Cognition: Cognition Orientation Level: Oriented X4 Cognition Arousal: Alert Behavior During Therapy: WFL for tasks assessed/performed  Blood pressure (!) 103/55, pulse 88, temperature 98.4 F (36.9 C), temperature source Oral, resp. rate 19, height 5\' 6"  (1.676 m), weight 87.1 kg, SpO2 95%. Physical Exam Constitutional:      General: She is not in acute distress.    Appearance: She is not ill-appearing.  HENT:     Head: Normocephalic.     Right Ear: External ear normal.     Left Ear: External ear normal.  Nose: Nose normal.     Mouth/Throat:     Pharynx: Oropharynx is clear.  Eyes:     Pupils: Pupils are equal, round, and reactive to light.  Cardiovascular:     Rate and Rhythm: Normal rate.  Pulmonary:     Effort: Pulmonary effort is normal.  Abdominal:     Palpations: Abdomen is soft.  Musculoskeletal:     Cervical back: Normal range of motion.     Right lower leg: No edema.     Left lower leg: No edema.     Comments: Low back tender with palpation and bed mobility.   Skin:    General: Skin is warm.     Comments: Surgical incision dressed  Neurological:     Mental Status: She is alert.     Comments: Alert and oriented x 3. Normal insight and awareness. Intact Memory. Normal language and speech. Cranial nerve exam unremarkable. MMT: BUE 5/5. RLE HF 2/5, KE 3- to 3/5, ADF/PF 4/5. LLE: HF 2/5 (pain component), KE 2- to 2/5, ADF/PF 1/5. Pt senses gross touch in both legs but seems to lose discrimination of light touch in both feet. DTR's tr to 1+.    Psychiatric:        Mood and Affect: Mood normal.        Behavior: Behavior normal.     Results for orders placed or performed during the hospital encounter of 01/28/24 (from the past 24 hours)  Glucose, capillary     Status: Abnormal   Collection Time: 01/28/24  5:56 PM  Result Value Ref Range   Glucose-Capillary 156 (H) 70 - 99 mg/dL  Glucose,  capillary     Status: Abnormal   Collection Time: 01/28/24 11:01 PM  Result Value Ref Range   Glucose-Capillary 177 (H) 70 - 99 mg/dL  Glucose, capillary     Status: Abnormal   Collection Time: 01/29/24  6:24 AM  Result Value Ref Range   Glucose-Capillary 184 (H) 70 - 99 mg/dL   Comment 1 Notify RN    Comment 2 Document in Chart   Glucose, capillary     Status: Abnormal   Collection Time: 01/29/24 12:22 PM  Result Value Ref Range   Glucose-Capillary 202 (H) 70 - 99 mg/dL   DG Lumbar Spine 2-3 Views Result Date: 01/28/2024 CLINICAL DATA:  Lumbar fusion EXAM: LUMBAR SPINE - 2-3 VIEW COMPARISON:  01/28/2024 FINDINGS: Two fluoroscopic images are obtained during the performance of the procedure and are provided for interpretation only. Images demonstrate discectomies at L2-3, L3-4, and L4-5, with transpedicular screws at the L2, L3, L4, and L5 vertebral bodies. Alignment is grossly anatomic. Please refer to operative report. Fluoroscopy time: 24.0 seconds, 20.72 mGy IMPRESSION: 1. Intraoperative evaluation as above. Please refer to the operative report. Electronically Signed   By: Sharlet Salina M.D.   On: 01/28/2024 20:43   DG Lumbar Spine 1 View Result Date: 01/28/2024 CLINICAL DATA:  Posterior lumbar interbody fusion, intraoperative assessment and level assessment EXAM: LUMBAR SPINE - 1 VIEW COMPARISON:  MRI lumbar spine 11/23/2023 FINDINGS: As on the lumbar MRI, the lowest fully segmental lumbar type non-rib-bearing vertebra is numbered L5. A lateral view of the lumbar spine with tissue spreader and sponge in place demonstrates a blunt tip probe oriented generally towards the L3-4 intervertebral space; the tip of the probe projects directly over the lower L3 spinous process. IMPRESSION: 1. L3-4 localization with blunt tip probe. Electronically Signed   By: Annitta Needs.D.  On: 01/28/2024 20:02   DG C-Arm 1-60 Min-No Report Result Date: 01/28/2024 Fluoroscopy was utilized by the  requesting physician.  No radiographic interpretation.   DG C-Arm 1-60 Min-No Report Result Date: 01/28/2024 Fluoroscopy was utilized by the requesting physician.  No radiographic interpretation.    Assessment/Plan: Diagnosis: 63 yo female with multilevel radiculopathy (L2-L5) d/t severe stenosis/spondylolisthesis s/p decompression and PLIF Does the need for close, 24 hr/day medical supervision in concert with the patient's rehab needs make it unreasonable for this patient to be served in a less intensive setting? Yes Co-Morbidities requiring supervision/potential complications:  -hx of recent CABG as well as AS/MVP -post-op pain -diabetes -hypertension -potential bowel and bladder considerations -?orthotic considerations for LLE Due to bladder management, bowel management, safety, skin/wound care, disease management, medication administration, pain management, and patient education, does the patient require 24 hr/day rehab nursing? Yes Does the patient require coordinated care of a physician, rehab nurse, therapy disciplines of PT, OT to address physical and functional deficits in the context of the above medical diagnosis(es)? Yes Addressing deficits in the following areas: balance, endurance, locomotion, strength, transferring, bowel/bladder control, bathing, dressing, feeding, grooming, toileting, and psychosocial support Can the patient actively participate in an intensive therapy program of at least 3 hrs of therapy per day at least 5 days per week? Yes The potential for patient to make measurable gains while on inpatient rehab is excellent Anticipated functional outcomes upon discharge from inpatient rehab are modified independent  with PT, modified independent with OT, n/a with SLP. Estimated rehab length of stay to reach the above functional goals is: 9-14 days Anticipated discharge destination: Home Overall Rehab/Functional Prognosis: excellent  POST ACUTE RECOMMENDATIONS: This  patient's condition is appropriate for continued rehabilitative care in the following setting: CIR Patient has agreed to participate in recommended program. Yes Note that insurance prior authorization may be required for reimbursement for recommended care.  Comment: It is only post-op day one but the patient has significant lower extremity weakness (L>R) with associated sensory loss. The patient was experiencing weakness and sensory symptoms in her legs leading up to this surgery. Pt's husband is at home and house is accessible. She is motivated to regain functional independence. Rehab Admissions Coordinator to follow up.     MEDICAL RECOMMENDATIONS: Sq lovenox for DVT prophylaxis. Might benefit from screening dopplers as well.    I have personally performed a face to face diagnostic evaluation of this patient. Additionally, I have examined the patient's medical record including any pertinent labs and radiographic images.    Thanks,  Ranelle Oyster, MD 01/29/2024

## 2024-01-29 NOTE — Plan of Care (Signed)

## 2024-01-30 ENCOUNTER — Other Ambulatory Visit: Payer: Self-pay

## 2024-01-30 ENCOUNTER — Encounter (HOSPITAL_COMMUNITY): Payer: Self-pay | Admitting: Neurosurgery

## 2024-01-30 LAB — GLUCOSE, CAPILLARY
Glucose-Capillary: 213 mg/dL — ABNORMAL HIGH (ref 70–99)
Glucose-Capillary: 230 mg/dL — ABNORMAL HIGH (ref 70–99)
Glucose-Capillary: 214 mg/dL — ABNORMAL HIGH (ref 70–99)
Glucose-Capillary: 178 mg/dL — ABNORMAL HIGH (ref 70–99)

## 2024-01-30 MED ORDER — PNEUMOCOCCAL 20-VAL CONJ VACC 0.5 ML IM SUSY
0.5000 mL | PREFILLED_SYRINGE | INTRAMUSCULAR | Status: AC
  Administered 2024-01-31: 0.5 mL via INTRAMUSCULAR
  Filled 2024-01-30: qty 0.5

## 2024-01-30 MED ORDER — ENSURE ENLIVE PO LIQD
237.0000 mL | Freq: Two times a day (BID) | ORAL | Status: DC
  Administered 2024-01-31: 237 mL via ORAL

## 2024-01-30 NOTE — TOC CM/SW Note (Signed)
 Transition of Care [facility]) - Inpatient Brief Assessment   Patient Details  Name: Stacy Frost MRN: [id number] Date of Birth: [birth year]  [facility] CM/SW Contact:    Kermit Balo, RN Phone Number: 01/30/2024, 2:22 PM   Clinical Narrative:  CIR awaiting insurance authorization.  Transition of Care Asessment: Insurance and Status: Insurance coverage has been reviewed Patient has primary care physician: Yes Home environment has been reviewed: home with spouse   Prior/Current Home Services: No current home services Social Drivers of Health Review: SDOH reviewed no interventions necessary Readmission risk has been reviewed: Yes Transition of care needs: transition of care needs identified, TOC will continue to follow

## 2024-01-30 NOTE — Progress Notes (Signed)
 Patient was able to urinate, bladder scan showed after urination, will continue to monitor

## 2024-01-30 NOTE — Progress Notes (Signed)
 Inpatient Rehab Admissions Coordinator:    I met with pt. And husband at bedside. Husband confirms he can provide 24/7 support at d/c. Insurance case is pending. Benefits reviewed with pt.   Megan Salon, MS, CCC-SLP Rehab Admissions Coordinator  [phone] (celll) [phone] (office)

## 2024-01-30 NOTE — Plan of Care (Signed)
  Problem: Education: Goal: Knowledge of General Education information will improve Description: Including pain rating scale, medication(s)/side effects and non-pharmacologic comfort measures Outcome: Progressing   Problem: Clinical Measurements: Goal: Respiratory complications will improve Outcome: Progressing Goal: Cardiovascular complication will be avoided Outcome: Progressing   Problem: Activity: Goal: Risk for activity intolerance will decrease Outcome: Progressing   Problem: Nutrition: Goal: Adequate nutrition will be maintained Outcome: Progressing   Problem: Coping: Goal: Level of anxiety will decrease Outcome: Progressing   Problem: Elimination: Goal: Will not experience complications related to urinary retention Outcome: Progressing   Problem: Skin Integrity: Goal: Risk for impaired skin integrity will decrease Outcome: Progressing   Problem: Coping: Goal: Ability to adjust to condition or change in health will improve Outcome: Progressing   Problem: Fluid Volume: Goal: Ability to maintain a balanced intake and output will improve Outcome: Progressing

## 2024-01-30 NOTE — Progress Notes (Signed)
 Physical Therapy Treatment Patient Details Name: Stacy Frost MRN: [id number] DOB: [birth year] Today's Date: 01/30/2024   History of Present Illness Pt is a 63 y/o female who presents s/p L2-L5 PLIF on  01/28/2024. Pt with post-op weakness and decreased sensation in BLE's. PMH includes: recent CABG, DM, HTN.    PT Comments  Pt is progressing towards goals. Currently pt is Min A for bed mobility, CGA for sit to stand and Mod A for transfers with RW. Pt L knee initially was buckling with WB but improved throughout session to intermittent mild buckling. Due to pt current functional status, home set up and available assistance at home recommending skilled physical therapy services > 3 hours/day in order to address strength, balance and functional mobility to decrease risk for falls, injury, immobility, skin break down and re-hospitalization.      If plan is discharge home, recommend the following: A lot of help with walking and/or transfers;Assistance with cooking/housework;Assist for transportation;Help with stairs or ramp for entrance     Equipment Recommendations  Rolling walker (2 wheels);BSC/3in1       Precautions / Restrictions Precautions Precautions: Back Precaution Booklet Issued: No Recall of Precautions/Restrictions: Intact Precaution/Restrictions Comments: Reviewed precautions verbally during functional mobility. Required Braces or Orthoses: Spinal Brace Spinal Brace: Lumbar corset;Applied in sitting position Restrictions Weight Bearing Restrictions Per Provider Order: No     Mobility  Bed Mobility Overal bed mobility: Needs Assistance Bed Mobility: Sit to Sidelying, Rolling, Sidelying to Sit Rolling: Contact guard assist Sidelying to sit: Min assist     Sit to sidelying: Contact guard assist General bed mobility comments: Verbal cues for sequencing. Min A to get trunk to midline. CGA for safety    Transfers Overall transfer level: Needs assistance Equipment used: Rolling  walker (2 wheels) Transfers: Sit to/from Stand, Bed to chair/wheelchair/BSC Sit to Stand: Contact guard assist   Step pivot transfers: Mod assist, +2 safety/equipment       General transfer comment: Pt performed sit to stand 3x during session fro EOB and BSC with verbal cues for safe hand placement for all 3. Spouse assisted with stabilizing RW for safety. Pt L knee blocked throughout with significant improved at end of session. Worked on wgt shifting in standing with TKE when shifting wgt back to LLE. Then worked on stepping in place at [employer]. Initially with maximum L Knee blocking during R step improving to itnermittent Min knee blocking. Pt was able to take side steps and then steps to and from [facility] with step by step instructions for foot placement , AD navigation and sequencing.    Ambulation/Gait     Pre-gait activities: Worked on wgt shifting, TKE, side stepping and steps EOB<>BSC with step by step sequencing. Initially Max A to block L knee improving to Min A intermittently        Balance Overall balance assessment: Needs assistance Sitting-balance support: Feet supported Sitting balance-Leahy Scale: Good     Standing balance support: Reliant on assistive device for balance Standing balance-Leahy Scale: Poor Standing balance comment: Required Max Knee support initially to prevent fall        Communication Communication Communication: No apparent difficulties  Cognition Arousal: Alert Behavior During Therapy: WFL for tasks assessed/performed   PT - Cognitive impairments: No apparent impairments     Following commands: Intact      Cueing Cueing Techniques: Verbal cues     General Comments General comments (skin integrity, edema, etc.): Incision covered. Vital signs stable throughout. Spouse present and supportive.  Pertinent Vitals/Pain Pain Assessment Pain Assessment: Faces Faces Pain Scale: Hurts a little bit Breathing: normal Pain Location:  Incisional Pain Descriptors / Indicators: Sore Pain Intervention(s): Monitored during session, Limited activity within patient's tolerance    Home Living Family/patient expects to be discharged to:: Private residence Living Arrangements: Spouse/significant other              PT Goals (current goals can now be found in the care plan section) Acute Rehab PT Goals Patient Stated Goal: Be able to walk PT Goal Formulation: With patient/family Time For Goal Achievement: 02/12/24 Potential to Achieve Goals: Good Progress towards PT goals: Progressing toward goals    Frequency    Min 5X/week      PT Plan  Continue with current POC        AM-PAC PT "6 Clicks" Mobility   Outcome Measure  Help needed turning from your back to your side while in a flat bed without using bedrails?: A Little Help needed moving from lying on your back to sitting on the side of a flat bed without using bedrails?: A Little Help needed moving to and from a bed to a chair (including a wheelchair)?: A Lot Help needed standing up from a chair using your arms (e.g., wheelchair or bedside chair)?: A Little Help needed to walk in hospital room?: Total Help needed climbing 3-5 steps with a railing? : Total 6 Click Score: 13    End of Session Equipment Utilized During Treatment: Gait belt;Back brace Activity Tolerance: Patient tolerated treatment well Patient left: in bed;with call bell/phone within reach;with family/visitor present;with bed alarm set Nurse Communication: Mobility status PT Visit Diagnosis: Unsteadiness on feet (R26.81);Pain;Difficulty in walking, not elsewhere classified (R26.2);Other symptoms and signs involving the nervous system (R29.898) Pain - part of body:  (back)     Time: 7829-5621 PT Time Calculation (min) (ACUTE ONLY): 27 min  Charges:    $Gait Training: 8-22 mins $Therapeutic Activity: 8-22 mins PT General Charges $$ ACUTE PT VISIT: 1 Visit                      Harrel Carina, DPT, CLT  Acute Rehabilitation Services Office: [phone] (Secure chat preferred)    Claudia Desanctis 01/30/2024, 2:26 PM

## 2024-01-30 NOTE — Progress Notes (Signed)
 Foley out, patient is due to void in 6 hours from now. Will continue to monitor

## 2024-01-30 NOTE — Progress Notes (Signed)
 Hemovac drain removed per order, 20ml bloody output noted in the drain. Patient tolerated well, will continue to monitor

## 2024-01-30 NOTE — Progress Notes (Addendum)
 Patient was transferred from [facility] last night to this floor with foley in, they told me during the report that patient insisted keeping the foley because, she does not feel anything when voiding,and can't walk or stand on her left leg, foley care and documentation completed, will page the attending (DR. Tressie Stalker) in the morning for his thought about keeping the foley in. Charge nurse notified and aware. Thanks!

## 2024-01-30 NOTE — Progress Notes (Signed)
 Subjective: The patient is alert and pleasant.  She is in no apparent distress.  She looks well.  Her husband is at the bedside.  She complains of left leg weakness but feels he is getting stronger.  We are awaiting rehab placement.  The Foley catheter was taken out this morning she has not urinated yet.  She does not complain of perineal numbness.  Objective: Vital signs in last 24 hours: Temp:  [98.7 F (37.1 C)-101.1 F (38.4 C)] 99.5 F (37.5 C) (04/02 0751) Pulse Rate:  [91-106] 91 (04/02 0856) Resp:  [17-20] 17 (04/02 0751) BP: (99-118)/(51-63) 118/63 (04/02 0856) SpO2:  [88 %-96 %] 92 % (04/02 0751) Estimated body mass index is 30.99 kg/m as calculated from the following:   Height as of this encounter: 5\' 6"  (1.676 m).   Weight as of this encounter: 87.1 kg.   Intake/Output from previous day: 04/01 0701 - 04/02 0700 In: 240 [P.O.:240] Out: 765 [Urine:650; Drains:115] Intake/Output this shift: No intake/output data recorded.  Physical exam the patient is alert and pleasant.  Her strength is grossly normal in her right lower extremity.  She gives away diffusely in her left lower extremity.  Lab Results: No results for input(s): "WBC", "HGB", "HCT", "PLT" in the last 72 hours. BMET No results for input(s): "NA", "K", "CL", "CO2", "GLUCOSE", "BUN", "CREATININE", "CALCIUM" in the last 72 hours.  Studies/Results: DG Lumbar Spine 2-3 Views Result Date: 01/28/2024 CLINICAL DATA:  Lumbar fusion EXAM: LUMBAR SPINE - 2-3 VIEW COMPARISON:  01/28/2024 FINDINGS: Two fluoroscopic images are obtained during the performance of the procedure and are provided for interpretation only. Images demonstrate discectomies at L2-3, L3-4, and L4-5, with transpedicular screws at the L2, L3, L4, and L5 vertebral bodies. Alignment is grossly anatomic. Please refer to operative report. Fluoroscopy time: 24.0 seconds, 20.72 mGy IMPRESSION: 1. Intraoperative evaluation as above. Please refer to the operative  report. Electronically Signed   By: Sharlet Salina M.D.   On: 01/28/2024 20:43   DG Lumbar Spine 1 View Result Date: 01/28/2024 CLINICAL DATA:  Posterior lumbar interbody fusion, intraoperative assessment and level assessment EXAM: LUMBAR SPINE - 1 VIEW COMPARISON:  MRI lumbar spine 11/23/2023 FINDINGS: As on the lumbar MRI, the lowest fully segmental lumbar type non-rib-bearing vertebra is numbered L5. A lateral view of the lumbar spine with tissue spreader and sponge in place demonstrates a blunt tip probe oriented generally towards the L3-4 intervertebral space; the tip of the probe projects directly over the lower L3 spinous process. IMPRESSION: 1. L3-4 localization with blunt tip probe. Electronically Signed   By: Gaylyn Rong M.D.   On: 01/28/2024 20:02   DG C-Arm 1-60 Min-No Report Result Date: 01/28/2024 Fluoroscopy was utilized by the requesting physician.  No radiographic interpretation.   DG C-Arm 1-60 Min-No Report Result Date: 01/28/2024 Fluoroscopy was utilized by the requesting physician.  No radiographic interpretation.    Assessment/Plan:  Postop day #2: We will mobilize the patient with PT.  We are awaiting rehab placement.  We will discontinue the Hemovac drain.  LOS: 2 days     Stacy Frost 01/30/2024, 9:22 AM     Patient ID: Stacy Frost, female   DOB: [birth year], 63 y.o.   MRN: [id number]

## 2024-01-31 ENCOUNTER — Inpatient Hospital Stay (HOSPITAL_COMMUNITY)

## 2024-01-31 LAB — RESPIRATORY PANEL BY PCR

## 2024-01-31 LAB — CBC WITH DIFFERENTIAL/PLATELET
Abs Immature Granulocytes: 0.08 10*3/uL — ABNORMAL HIGH (ref 0.00–0.07)
Basophils Absolute: 0 10*3/uL (ref 0.0–0.1)
Basophils Relative: 0 %
Eosinophils Absolute: 0 10*3/uL (ref 0.0–0.5)
Eosinophils Relative: 0 %
HCT: 21.4 % — ABNORMAL LOW (ref 36.0–46.0)
Hemoglobin: 7.3 g/dL — ABNORMAL LOW (ref 12.0–15.0)
Immature Granulocytes: 1 %
Lymphocytes Relative: 7 %
Lymphs Abs: 1 10*3/uL (ref 0.7–4.0)
MCH: 29 pg (ref 26.0–34.0)
MCHC: 34.1 g/dL (ref 30.0–36.0)
MCV: 84.9 fL (ref 80.0–100.0)
Monocytes Absolute: 0.9 10*3/uL (ref 0.1–1.0)
Monocytes Relative: 6 %
Neutro Abs: 12.4 10*3/uL — ABNORMAL HIGH (ref 1.7–7.7)
Neutrophils Relative %: 86 %
Platelets: 176 10*3/uL (ref 150–400)
RBC: 2.52 MIL/uL — ABNORMAL LOW (ref 3.87–5.11)
RDW: 12.6 % (ref 11.5–15.5)
WBC: 14.4 10*3/uL — ABNORMAL HIGH (ref 4.0–10.5)
nRBC: 0 % (ref 0.0–0.2)

## 2024-01-31 LAB — URINALYSIS, ROUTINE W REFLEX MICROSCOPIC
Bilirubin Urine: NEGATIVE
Glucose, UA: 100 mg/dL — AB
Hgb urine dipstick: NEGATIVE
Ketones, ur: NEGATIVE mg/dL
Leukocytes,Ua: NEGATIVE
Nitrite: NEGATIVE
Protein, ur: NEGATIVE mg/dL
Specific Gravity, Urine: 1.015 (ref 1.005–1.030)
pH: 6 (ref 5.0–8.0)

## 2024-01-31 LAB — BASIC METABOLIC PANEL WITH GFR
Anion gap: 13 (ref 5–15)
BUN: 12 mg/dL (ref 8–23)
CO2: 23 mmol/L (ref 22–32)
Calcium: 8.6 mg/dL — ABNORMAL LOW (ref 8.9–10.3)
Chloride: 98 mmol/L (ref 98–111)
Creatinine, Ser: 0.92 mg/dL (ref 0.44–1.00)
GFR, Estimated: >60 mL/min (ref >60)
Glucose, Bld: 259 mg/dL — ABNORMAL HIGH (ref 70–99)
Potassium: 3.7 mmol/L (ref 3.5–5.1)
Sodium: 134 mmol/L — ABNORMAL LOW (ref 135–145)

## 2024-01-31 LAB — GLUCOSE, CAPILLARY
Glucose-Capillary: 239 mg/dL — ABNORMAL HIGH (ref 70–99)
Glucose-Capillary: 241 mg/dL — ABNORMAL HIGH (ref 70–99)
Glucose-Capillary: 154 mg/dL — ABNORMAL HIGH (ref 70–99)
Glucose-Capillary: 253 mg/dL — ABNORMAL HIGH (ref 70–99)

## 2024-01-31 NOTE — Progress Notes (Signed)
 IP rehab admissions - Noted temp spike to 103.1 earlier today, HR 107.  I notified Meghan and Dr. Lovell Sheehan.  I initially offered CIR admission for today, but we need to explore the temp elevation and then decide next steps.  [phone]

## 2024-01-31 NOTE — H&P (Signed)
 Physical Medicine and Rehabilitation Admission H&P     HPI: Stacy Frost is a 63 year old right-handed female with history significant for  diabetes mellitus with peripheral neuropathy, anxiety, aortic stenosis/MVP status post aortic valve replacement July 2024 per Dr. Cliffton Asters, hypertension, chronic anemia, hyperlipidemia, migraine headaches, restless leg syndrome.  Per chart review patient lives with spouse.  1 level home one-step to entry.  Used a four-wheel rolling walker for mobility and was able to perform her own ADLs.  Spouse just retired and can provide assistance as needed.  Presented 01/28/2024 with increasing low back and bilateral buttock/leg pain consistent with neurogenic claudication.  MRI revealed multilevel spondylolisthesis and stenosis compressing the L2, L3, L4, and L5 nerve roots.  Underwent elective bilateral L2-3, L3-4 and L4-5 laminotomy/foraminotomies/medial facetectomies to decompress the L2-L5 nerve root as well as PLIF L2-L5 by Dr. Tressie Stalker 01/28/2024.  Postoperatively lumbar corset applied in sitting position when out of bed.  She did spike a low-grade fever resolved with Tylenol.  Pain management with use of scheduled Neurontin with Flexeril and oxycodone as needed.  Foley catheter tube removed 01/30/2024 as well as Hemovac drain.  Therapy evaluations completed due to patient decreased functional mobility was admitted for a comprehensive rehab program.  Review of Systems  Constitutional:  Positive for malaise/fatigue. Negative for chills and fever.  HENT:  Negative for hearing loss.   Eyes:  Negative for blurred vision and double vision.  Respiratory:  Negative for cough, shortness of breath and wheezing.   Cardiovascular:  Negative for chest pain, palpitations and leg swelling.  Gastrointestinal:        GERD  Genitourinary:  Negative for dysuria, flank pain and hematuria.  Musculoskeletal:  Positive for back pain and myalgias.  Skin:  Negative for rash.   Neurological:  Positive for sensory change, focal weakness, weakness and headaches.       Restless leg syndrome  Psychiatric/Behavioral:         Anxiety  All other systems reviewed and are negative.  Past Medical History:  Diagnosis Date   Anxiety    Aortic stenosis    Mild to moderate   Bicuspid aortic valve    Essential hypertension, benign    GERD (gastroesophageal reflux disease)    Heart murmur    S/P Aortic Valve Replacement July 2024   History of cardiac catheterization    Normal coronaries 2008   Hyperlipidemia    Migraine headache    MVP (mitral valve prolapse)    Obesity    Pain in joint, ankle and foot    Chronic   Palpitations    Documented PACs by previous monitoring   Restless leg syndrome    Skin tag    Type 2 diabetes mellitus (HCC)    Unilateral primary osteoarthritis, right knee 09/06/2016   Past Surgical History:  Procedure Laterality Date   ACHILLES TENDON REPAIR  2003   Left   AORTIC VALVE REPLACEMENT N/A 05/14/2023   Procedure: AORTIC VALVE REPLACEMENT (AVR);  Surgeon: Corliss Skains, MD;  Location: [facility] OR;  Service: Open Heart Surgery;  Laterality: N/A;   CARDIAC CATHETERIZATION  2008   Normal coronary arteries; normal LV systolic function; mild dilation of aortic root; mild dilation of proximal ascending aorta; likely bicuspid aortic valve   CHOLECYSTECTOMY  2001   "Poor EF at 17%"   COLONOSCOPY N/A 07/17/2014   Procedure: COLONOSCOPY;  Surgeon: Malissa Hippo, MD;  Location: [facility] ENDO SUITE;  Service: Endoscopy;  Laterality: N/A;  105   ESOPHAGEAL DILATION N/A 06/11/2015   Procedure: ESOPHAGEAL DILATION;  Surgeon: Malissa Hippo, MD;  Location: [facility] ORS;  Service: Endoscopy;  Laterality: N/AElease Hashimoto 54/56/58   ESOPHAGOGASTRODUODENOSCOPY N/A 07/17/2014   Procedure: ESOPHAGOGASTRODUODENOSCOPY (EGD);  Surgeon: Malissa Hippo, MD;  Location: [facility];  Service: Endoscopy;  Laterality: N/A;   ESOPHAGOGASTRODUODENOSCOPY (EGD) WITH  PROPOFOL N/A 06/11/2015   Procedure: ESOPHAGOGASTRODUODENOSCOPY (EGD) WITH PROPOFOL;  Surgeon: Malissa Hippo, MD;  Location: [facility] ORS;  Service: Endoscopy;  Laterality: N/A;  730   KNEE ARTHROSCOPY WITH MEDIAL MENISECTOMY Right 07/02/2018   Procedure: RIGHT KNEE ARTHROSCOPY, DEBRIDEMENT, MEDIAL MENISECTOMY;  Surgeon: [name], MD;  Location: [facility] OR;  Service: Orthopedics;  Laterality: Right;   KNEE SURGERY     MALONEY DILATION N/A 07/17/2014   Procedure: MALONEY DILATION;  Surgeon: Malissa Hippo, MD;  Location: [facility];  Service: Endoscopy;  Laterality: N/A;   RIGHT/LEFT HEART CATH AND CORONARY ANGIOGRAPHY N/A 05/02/2023   Procedure: RIGHT/LEFT HEART CATH AND CORONARY ANGIOGRAPHY;  Surgeon: Swaz[place], Peter M, MD;  Location: [facility] INVASIVE CV LAB;  Service: Cardiovascular;  Laterality: N/A;   TEE WITHOUT CARDIOVERSION N/A 05/14/2023   Procedure: TRANSESOPHAGEAL ECHOCARDIOGRAM;  Surgeon: Corliss Skains, MD;  Location: [facility] OR;  Service: Open Heart Surgery;  Laterality: N/A;   UMBILICAL HERNIA REPAIR  2003   Family History  Problem Relation Age of Onset   Depression Mother    Cancer Mother        Lung mets (unsure as to what kind)   Diabetes Mother    Hypertension Father    Depression Father    Heart attack Father    Depression Daughter    Anxiety disorder Daughter    Stroke Daughter    Transient ischemic attack Brother    Emphysema Maternal Grandfather    Brain cancer Paternal Grandmother    Heart attack Paternal Grandfather    Social History:  reports that she has never smoked. She has never used smokeless tobacco. She reports that she does not drink alcohol and does not use drugs. Allergies:  Allergies  Allergen Reactions   Amlodipine Besylate Shortness Of Breath   Morphine And Codeine Shortness Of Breath and Other (See Comments)    Chest pain   Amitriptyline Other (See Comments)    Unknown reaction   Butorphanol Tartrate Other (See Comments)    REACTION: ED  visit and got for migraine - not sure of response   Compazine [Prochlorperazine Edisylate] Other (See Comments)    Altered mental status   Rocephin [Ceftriaxone Sodium In Dextrose]     Given at GYN for infection - nauseated, dizziness (w/in last 7-8 years)   Sumatriptan Other (See Comments)    REACTION: HTN   Medications Prior to Admission  Medication Sig Dispense Refill   acetaminophen-codeine (TYLENOL #3) 300-30 MG tablet Take 1 tablet by mouth 2 (two) times daily as needed for moderate pain (pain score 4-6) (migraines).     atorvastatin (LIPITOR) 80 MG tablet TAKE 1 TABLET BY MOUTH ONCE DAILY 90 tablet 3   celecoxib (CELEBREX) 200 MG capsule Take 200 mg by mouth 2 (two) times daily.     citalopram (CELEXA) 40 MG tablet Take 40 mg by mouth daily.     cyclobenzaprine (FLEXERIL) 10 MG tablet Take 10 mg by mouth 3 (three) times daily as needed for muscle spasms.     ferrous sulfate 325 (65 FE) MG tablet Take 1 tablet (325 mg total)  by mouth daily with breakfast. 60 tablet 1   furosemide (LASIX) 40 MG tablet Take 0.5 tablets (20 mg total) by mouth daily as needed (for swelling and weight gain). (Patient taking differently: Take 40 mg by mouth daily.) 30 tablet 5   gabapentin (NEURONTIN) 100 MG capsule Take 100 mg by mouth 3 (three) times daily. Take 100mg  (1 capsule) by mouth every morning and 200mg  (2 capsules) in the evening.     insulin glargine-yfgn (SEMGLEE, YFGN,) 100 UNIT/ML Pen Inject 30 Units into the skin at bedtime. (Patient taking differently: Inject 40 Units into the skin at bedtime.) 27 mL 3   metFORMIN (GLUCOPHAGE-XR) 500 MG 24 hr tablet TAKE 2 TABLETS BY MOUTH DAILY WITH BREAKFAST 180 tablet 1   metoprolol succinate (TOPROL-XL) 25 MG 24 hr tablet Take 1 tablet (25 mg total) by mouth 2 (two) times daily at 10 AM and 5 PM. Take with or immediately following a meal.     MOUNJARO 7.5 MG/0.5ML Pen INJECT 7.5 MG UNDER THE SKIN WEEKLY 6 mL 1   potassium chloride SA (KLOR-CON M) 20 MEQ  tablet TAKE 1 TABLET BY MOUTH DAILY 30 tablet 0   pramipexole (MIRAPEX) 0.5 MG tablet Take 1 mg by mouth at bedtime.     promethazine (PHENERGAN) 25 MG tablet Take 25 mg by mouth 2 (two) times daily as needed.     traZODone (DESYREL) 150 MG tablet Take 150 mg by mouth at bedtime.     aspirin EC 325 MG tablet Take 1 tablet (325 mg total) by mouth daily.     clonazePAM (KLONOPIN) 0.5 MG tablet Take 0.5 mg by mouth 2 (two) times daily as needed for anxiety.     Continuous Blood Gluc Receiver (DEXCOM G7 RECEIVER) DEVI USE 1 receiver continuously     Continuous Blood Gluc Sensor (DEXCOM G7 SENSOR) MISC APPLY 1 SENSOR EVERY 10 DAYS        Home: Home Living Family/patient expects to be discharged to:: Private residence Living Arrangements: Spouse/significant other Available Help at Discharge: Family Type of Home: House Home Access: Stairs to enter Secretary/administrator of Steps: 1&1 Entrance Stairs-Rails: None Home Layout: One level Bathroom Shower/Tub: Engineer, manufacturing systems: Standard Bathroom Accessibility: No Home Equipment: Information systems manager, BSC/3in1, Rollator (4 wheels)   Functional History: Prior Function Prior Level of Function : Independent/Modified Independent  Functional Status:  Mobility: Bed Mobility Overal bed mobility: Needs Assistance Bed Mobility: Sit to Sidelying, Rolling, Sidelying to Sit Rolling: Contact guard assist Sidelying to sit: Min assist Sit to sidelying: Contact guard assist General bed mobility comments: Verbal cues for sequencing. Min A to get trunk to midline. CGA for safety Transfers Overall transfer level: Needs assistance Equipment used: Rolling walker (2 wheels) Transfers: Sit to/from Stand, Bed to chair/wheelchair/BSC Sit to Stand: Contact guard assist Bed to/from chair/wheelchair/BSC transfer type:: Step pivot Step pivot transfers: Mod assist, +2 safety/equipment Transfer via Lift Equipment: Stedy General transfer comment: Pt  performed sit to stand 3x during session fro EOB and BSC with verbal cues for safe hand placement for all 3. Spouse assisted with stabilizing RW for safety. Pt L knee blocked throughout with significant improved at end of session. Worked on wgt shifting in standing with TKE when shifting wgt back to LLE. Then worked on stepping in place at [employer]. Initially with maximum L Knee blocking during R step improving to itnermittent Min knee blocking. Pt was able to take side steps and then steps to and from [facility] with step by  step instructions for foot placement , AD navigation and sequencing. Ambulation/Gait Pre-gait activities: Worked on wgt shifting, TKE, side stepping and steps EOB<>BSC with step by step sequencing. Initially Max A to block L knee improving to Min A intermittently    ADL: ADL Overall ADL's : Needs assistance/impaired Eating/Feeding: Independent, Sitting Grooming: Wash/dry hands, Wash/dry face, Set up, Sitting Upper Body Bathing: Set up, Sitting Lower Body Bathing: Moderate assistance, Sitting/lateral leans Upper Body Dressing : Set up, Sitting Lower Body Dressing: Moderate assistance, Sitting/lateral leans Toilet Transfer: Moderate assistance, Transfer board, BSC/3in1  Cognition: Cognition Orientation Level: Oriented X4 Cognition Arousal: Alert Behavior During Therapy: WFL for tasks assessed/performed  Physical Exam: Blood pressure (!) 148/61, pulse (!) 107, temperature (!) 103.1 F (39.5 C), temperature source Oral, resp. rate 17, height 5\' 6"  (1.676 m), weight 87.1 kg, SpO2 98%. Physical Exam Neurological:     Comments: Patient is alert and oriented x 3 following commands.     Results for orders placed or performed during the hospital encounter of 01/28/24 (from the past 48 hours)  Glucose, capillary     Status: Abnormal   Collection Time: 01/29/24  6:24 AM  Result Value Ref Range   Glucose-Capillary 184 (H) 70 - 99 mg/dL    Comment: Glucose reference range applies  only to samples taken after fasting for at least 8 hours.   Comment 1 Notify RN    Comment 2 Document in Chart   Glucose, capillary     Status: Abnormal   Collection Time: 01/29/24 12:22 PM  Result Value Ref Range   Glucose-Capillary 202 (H) 70 - 99 mg/dL    Comment: Glucose reference range applies only to samples taken after fasting for at least 8 hours.  Glucose, capillary     Status: Abnormal   Collection Time: 01/29/24  5:30 PM  Result Value Ref Range   Glucose-Capillary 162 (H) 70 - 99 mg/dL    Comment: Glucose reference range applies only to samples taken after fasting for at least 8 hours.  Glucose, capillary     Status: Abnormal   Collection Time: 01/29/24  9:31 PM  Result Value Ref Range   Glucose-Capillary 151 (H) 70 - 99 mg/dL    Comment: Glucose reference range applies only to samples taken after fasting for at least 8 hours.   Comment 1 Notify RN   Glucose, capillary     Status: Abnormal   Collection Time: 01/30/24  6:20 AM  Result Value Ref Range   Glucose-Capillary 213 (H) 70 - 99 mg/dL    Comment: Glucose reference range applies only to samples taken after fasting for at least 8 hours.   Comment 1 Notify RN   Glucose, capillary     Status: Abnormal   Collection Time: 01/30/24 11:49 AM  Result Value Ref Range   Glucose-Capillary 230 (H) 70 - 99 mg/dL    Comment: Glucose reference range applies only to samples taken after fasting for at least 8 hours.  Glucose, capillary     Status: Abnormal   Collection Time: 01/30/24  4:26 PM  Result Value Ref Range   Glucose-Capillary 214 (H) 70 - 99 mg/dL    Comment: Glucose reference range applies only to samples taken after fasting for at least 8 hours.  Glucose, capillary     Status: Abnormal   Collection Time: 01/30/24  9:32 PM  Result Value Ref Range   Glucose-Capillary 178 (H) 70 - 99 mg/dL    Comment: Glucose reference range applies only  to samples taken after fasting for at least 8 hours.   Comment 1 Notify RN    No  results found.    Blood pressure (!) 148/61, pulse (!) 107, temperature (!) 103.1 F (39.5 C), temperature source Oral, resp. rate 17, height 5\' 6"  (1.676 m), weight 87.1 kg, SpO2 98%.  Medical Problem List and Plan: 1. 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  Back corset when out of bed  -patient may *** shower  -ELOS/Goals: *** 2.  Antithrombotics: -DVT/anticoagulation:  Mechanical: Antiembolism stockings, thigh (TED hose) Bilateral lower extremities  -antiplatelet therapy: N/A 3. Pain Management: Neurontin 100 mg daily and 200 mg nightly, Flexeril and oxycodone as needed 4. Mood/Behavior/Sleep: Celexa 40 mg daily, Klonopin as needed  -antipsychotic agents: N/A 5. Neuropsych/cognition: This patient is capable of making decisions on her own behalf. 6. Skin/Wound Care: Routine skin checks 7. Fluids/Electrolytes/Nutrition: Routine in and outs with follow-up chemistries 8.  History aortic stenosis/MVP.  Status post aortic valve replacement July 2024 per Dr. Cliffton Asters 9.  Hypertension.  Toprol-XL 25 mg twice daily, Lasix 40 mg daily.  Monitor with increased mobility 10.  Hyperlipidemia.  Lipitor 11.  Diabetes mellitus with peripheral neuropathy.  Latest hemoglobin A1c 9.7.  Glucophage 1000 mg daily. 12.  Chronic anemia.  Continue iron supplement.  Follow-up CBC 13.  Hyperlipidemia.  Lipitor 14.  Restless leg syndrome.  Mirapex 1 mg nightly 15.  Obesity.  BMI 30.99.  Dietary follow-up Charlton Amor, PA-C 01/31/2024

## 2024-01-31 NOTE — Plan of Care (Signed)
  Problem: Education: Goal: Knowledge of General Education information will improve Description: Including pain rating scale, medication(s)/side effects and non-pharmacologic comfort measures Outcome: Progressing   Problem: Clinical Measurements: Goal: Respiratory complications will improve Outcome: Progressing   Problem: Activity: Goal: Risk for activity intolerance will decrease Outcome: Progressing   Problem: Nutrition: Goal: Adequate nutrition will be maintained Outcome: Progressing   Problem: Fluid Volume: Goal: Ability to maintain a balanced intake and output will improve Outcome: Progressing   Problem: Education: Goal: Ability to verbalize activity precautions or restrictions will improve Outcome: Progressing

## 2024-01-31 NOTE — Progress Notes (Signed)
   Providing Compassionate, Quality Care - Together   Subjective: Received call from [facility] admissions coordinator regarding patient's spike in temperature. Patient's temp went up to 103, she became tachycardic, and she required supplemental oxygen overnight. Patient reports feelings of general malaise today this morning.  Objective: Vital signs in last 24 hours: Temp:  [98.6 F (37 C)-103.1 F (39.5 C)] 98.6 F (37 C) (04/03 0725) Pulse Rate:  [88-107] 95 (04/03 0725) Resp:  [17-19] 18 (04/03 0725) BP: (102-148)/(45-84) 137/84 (04/03 0937) SpO2:  [85 %-98 %] 95 % (04/03 0937)  Intake/Output from previous day: No intake/output data recorded. Intake/Output this shift: No intake/output data recorded.  Alert and oriented x 4 PERRLA CN II-XII grossly intact MAE, Strength and sensation intact Incision is covered with Honeycomb dressing and Steri Strips; Dressing is clean, dry, and intact   Lab Results: No results for input(s): "WBC", "HGB", "HCT", "PLT" in the last 72 hours. BMET No results for input(s): "NA", "K", "CL", "CO2", "GLUCOSE", "BUN", "CREATININE", "CALCIUM" in the last 72 hours.  Studies/Results: No results found.  Assessment/Plan: Ms. Lore underwent a three level lumbar fusion by Dr. Lovell Sheehan on 01/28/2024. She developed a high temp overnight and her oxygen level dropped.   LOS: 3 days   -Will hold off on discharging to CIR for today. -CBC, BMP, CXR, UA, and blood cultures ordered.  I am in communication with my attending and they agree with the plan for this patient.   Val Eagle, DNP, AGNP-C Nurse Practitioner  [facility] [address], [place], [place] [address] P: [phone]    F: [phone]  01/31/2024, 11:12 AM

## 2024-01-31 NOTE — Plan of Care (Signed)
  Problem: Pain Managment: Goal: General experience of comfort will improve and/or be controlled Outcome: Progressing

## 2024-01-31 NOTE — Progress Notes (Signed)
   01/31/24 1500  Spiritual Encounters  Type of Visit Follow up  Care provided to: Pt and family  Referral source Patient request  Reason for visit Advance directives  OnCall Visit No  Spiritual Framework  Presenting Themes Significant life change  Community/Connection Family   Chaplain responded to consult request for HCPOA. Chaplain was present in the morning. Pt preferred to her husband to be here while receiving the education of the document. She wanted to meet in the afternoon. Chaplain revisited at 3:20 pm for providing the education, but the patient was under medication. She stated that she is very tired right now due to physical therapy. She would like to have chaplain visit tomorrow morning.  Chaplain will be available if needed.   M.Kubra Delano Metz Resident [phone]

## 2024-01-31 NOTE — Progress Notes (Signed)
 Physical Therapy Treatment Patient Details Name: Stacy Frost MRN: [id number] DOB: [birth year] Today's Date: 01/31/2024   History of Present Illness Pt is a 63 y/o female who presents s/p L2-L5 PLIF on  01/28/2024. Pt with post-op weakness and decreased sensation in BLE's. PMH includes: recent CABG, DM, HTN.    PT Comments  Pt is progressing towards goals. Pt requires Min A for bed mobility,  Min A with RW for sit to stand and Min-Mod A for 20 ft of ambulation with RW. Pt LLE tends to buckle occasionally and pt tends to drag LLE during gait with LLE trailing. Due to pt current functional status, home set up and available assistance at home recommending skilled physical therapy services > 3 hours/day in order to address strength, balance and functional mobility to decrease risk for falls, injury, immobility, skin break down and re-hospitalization.     If plan is discharge home, recommend the following: A lot of help with walking and/or transfers;Assistance with cooking/housework;Assist for transportation;Help with stairs or ramp for entrance     Equipment Recommendations  Rolling walker (2 wheels);BSC/3in1       Precautions / Restrictions Precautions Precautions: Back Precaution Booklet Issued: No Recall of Precautions/Restrictions: Impaired Precaution/Restrictions Comments: Reviewed precautions verbally during functional mobility. Required Braces or Orthoses: Spinal Brace Spinal Brace: Lumbar corset;Applied in sitting position Restrictions Weight Bearing Restrictions Per Provider Order: No     Mobility  Bed Mobility Overal bed mobility: Needs Assistance Bed Mobility: Sit to Sidelying, Rolling, Sidelying to Sit Rolling: Contact guard assist Sidelying to sit: Min assist     Sit to sidelying: Min assist General bed mobility comments: Verbal cues for sequencing. Min A to get trunk to midline and to Get LE back into the bed.    Transfers Overall transfer level: Needs  assistance Equipment used: Rolling walker (2 wheels) Transfers: Sit to/from Stand Sit to Stand: Min assist           General transfer comment: verbal cues for safe hand placement. No knee blocking required today. Mod to heavy assist on RW.    Ambulation/Gait Ambulation/Gait assistance: Mod assist Gait Distance (Feet): 20 Feet Assistive device: Rolling walker (2 wheels) Gait Pattern/deviations: Step-to pattern, Decreased stance time - right, Decreased step length - left, Knees buckling Gait velocity: decreased Gait velocity interpretation: <1.31 ft/sec, indicative of household ambulator   General Gait Details: Pt requires verbal cues intermittently to progress the LLE due to trailing. Min - Mod A throughout with occasional buckling at the L Knee requiring increased assist for balance.        Balance Overall balance assessment: Needs assistance Sitting-balance support: Feet supported Sitting balance-Leahy Scale: Good     Standing balance support: Reliant on assistive device for balance, Bilateral upper extremity supported, During functional activity Standing balance-Leahy Scale: Poor Standing balance comment: intermittent knee buckling requiring Mod A        Communication Communication Communication: No apparent difficulties  Cognition Arousal: Alert Behavior During Therapy: WFL for tasks assessed/performed   PT - Cognitive impairments: No apparent impairments       Following commands: Intact      Cueing Cueing Techniques: Verbal cues     General Comments General comments (skin integrity, edema, etc.): Incsion covered. Vital signs stable throughout.      Pertinent Vitals/Pain Pain Assessment Pain Assessment: 0-10 Pain Score: 8  Pain Location: Incisional Pain Descriptors / Indicators: Sore Pain Intervention(s): Monitored during session, Limited activity within patient's tolerance     PT Goals (  current goals can now be found in the care plan section)  Acute Rehab PT Goals Patient Stated Goal: Be able to walk PT Goal Formulation: With patient/family Time For Goal Achievement: 02/12/24 Potential to Achieve Goals: Good Progress towards PT goals: Progressing toward goals    Frequency    Min 5X/week      PT Plan  Continue with current POC        AM-PAC PT "6 Clicks" Mobility   Outcome Measure  Help needed turning from your back to your side while in a flat bed without using bedrails?: A Little Help needed moving from lying on your back to sitting on the side of a flat bed without using bedrails?: A Little Help needed moving to and from a bed to a chair (including a wheelchair)?: A Lot Help needed standing up from a chair using your arms (e.g., wheelchair or bedside chair)?: A Little Help needed to walk in hospital room?: A Lot Help needed climbing 3-5 steps with a railing? : Total 6 Click Score: 14    End of Session Equipment Utilized During Treatment: Gait belt;Back brace Activity Tolerance: Patient tolerated treatment well Patient left: in bed;with call bell/phone within reach;with bed alarm set Nurse Communication: Mobility status PT Visit Diagnosis: Unsteadiness on feet (R26.81);Pain;Difficulty in walking, not elsewhere classified (R26.2);Other symptoms and signs involving the nervous system (R29.898) Pain - Right/Left:  (back) Pain - part of body:  (back)     Time: 1455-1511 PT Time Calculation (min) (ACUTE ONLY): 16 min  Charges:    $Gait Training: 8-22 mins PT General Charges $$ ACUTE PT VISIT: 1 Visit                    Harrel Carina, DPT, CLT  Acute Rehabilitation Services Office: [phone] (Secure chat preferred)    Claudia Desanctis 01/31/2024, 3:50 PM

## 2024-01-31 NOTE — Progress Notes (Signed)
   01/31/24 0449  Assess: MEWS Score  Temp (!) 103.1 F (39.5 C) (giving tylenol and ice packs under each arm)  BP (!) 148/61  MAP (mmHg) 84  Pulse Rate (!) 107  Level of Consciousness Alert  SpO2 98 %  O2 Device Nasal Cannula  O2 Flow Rate (L/min) 2 L/min  Assess: MEWS Score  MEWS Temp 2  MEWS Systolic 0  MEWS Pulse 1  MEWS RR 0  MEWS LOC 0  MEWS Score 3  MEWS Score Color Yellow  Assess: if the MEWS score is Yellow or Red  Were vital signs accurate and taken at a resting state? Yes  Does the patient meet 2 or more of the SIRS criteria? No  MEWS guidelines implemented  Yes, yellow  Treat  MEWS Interventions Considered administering scheduled or prn medications/treatments as ordered  Take Vital Signs  Increase Vital Sign Frequency  Yellow: Q2hr x1, continue Q4hrs until patient remains green for 12hrs  Escalate  MEWS: Escalate Yellow: Discuss with charge nurse and consider notifying provider and/or RRT  Notify: Charge Nurse/RN  Name of Charge Nurse/RN Notified Therapist, nutritional  Assess: SIRS CRITERIA  SIRS Temperature  1  SIRS Respirations  0  SIRS Pulse 1  SIRS WBC 0  SIRS Score Sum  2

## 2024-02-01 ENCOUNTER — Encounter (HOSPITAL_COMMUNITY): Payer: Self-pay | Admitting: Physical Medicine and Rehabilitation

## 2024-02-01 ENCOUNTER — Other Ambulatory Visit: Payer: Self-pay

## 2024-02-01 ENCOUNTER — Inpatient Hospital Stay (HOSPITAL_COMMUNITY)
Admission: AD | Admit: 2024-02-01 | Discharge: 2024-02-13 | DRG: 949 | Disposition: A | Discharge status: Home / Self Care | Source: Intra-hospital | Attending: Physical Medicine and Rehabilitation | Admitting: Physical Medicine and Rehabilitation

## 2024-02-01 DIAGNOSIS — M48061 Spinal stenosis, lumbar region without neurogenic claudication: Secondary | ICD-10-CM | POA: Diagnosis present

## 2024-02-01 DIAGNOSIS — E785 Hyperlipidemia, unspecified: Secondary | ICD-10-CM | POA: Diagnosis present

## 2024-02-01 DIAGNOSIS — E876 Hypokalemia: Secondary | ICD-10-CM | POA: Diagnosis present

## 2024-02-01 DIAGNOSIS — I10 Essential (primary) hypertension: Secondary | ICD-10-CM | POA: Diagnosis present

## 2024-02-01 DIAGNOSIS — Z952 Presence of prosthetic heart valve: Secondary | ICD-10-CM

## 2024-02-01 DIAGNOSIS — Z48811 Encounter for surgical aftercare following surgery on the nervous system: Principal | ICD-10-CM

## 2024-02-01 DIAGNOSIS — K5900 Constipation, unspecified: Secondary | ICD-10-CM | POA: Diagnosis present

## 2024-02-01 DIAGNOSIS — Z7985 Long-term (current) use of injectable non-insulin antidiabetic drugs: Secondary | ICD-10-CM

## 2024-02-01 DIAGNOSIS — X58XXXD Exposure to other specified factors, subsequent encounter: Secondary | ICD-10-CM | POA: Diagnosis present

## 2024-02-01 DIAGNOSIS — Z981 Arthrodesis status: Secondary | ICD-10-CM | POA: Diagnosis not present

## 2024-02-01 DIAGNOSIS — G4700 Insomnia, unspecified: Secondary | ICD-10-CM | POA: Diagnosis present

## 2024-02-01 DIAGNOSIS — Z794 Long term (current) use of insulin: Secondary | ICD-10-CM

## 2024-02-01 DIAGNOSIS — R739 Hyperglycemia, unspecified: Secondary | ICD-10-CM | POA: Diagnosis not present

## 2024-02-01 DIAGNOSIS — Z8249 Family history of ischemic heart disease and other diseases of the circulatory system: Secondary | ICD-10-CM

## 2024-02-01 DIAGNOSIS — S0081XD Abrasion of other part of head, subsequent encounter: Secondary | ICD-10-CM

## 2024-02-01 DIAGNOSIS — Z823 Family history of stroke: Secondary | ICD-10-CM

## 2024-02-01 DIAGNOSIS — Z7984 Long term (current) use of oral hypoglycemic drugs: Secondary | ICD-10-CM | POA: Diagnosis not present

## 2024-02-01 DIAGNOSIS — Z833 Family history of diabetes mellitus: Secondary | ICD-10-CM | POA: Diagnosis not present

## 2024-02-01 DIAGNOSIS — Z9049 Acquired absence of other specified parts of digestive tract: Secondary | ICD-10-CM

## 2024-02-01 DIAGNOSIS — M5416 Radiculopathy, lumbar region: Principal | ICD-10-CM | POA: Diagnosis present

## 2024-02-01 DIAGNOSIS — W19XXXA Unspecified fall, initial encounter: Secondary | ICD-10-CM | POA: Diagnosis not present

## 2024-02-01 DIAGNOSIS — Z6831 Body mass index (BMI) 31.0-31.9, adult: Secondary | ICD-10-CM

## 2024-02-01 DIAGNOSIS — M7989 Other specified soft tissue disorders: Secondary | ICD-10-CM | POA: Diagnosis not present

## 2024-02-01 DIAGNOSIS — E1165 Type 2 diabetes mellitus with hyperglycemia: Secondary | ICD-10-CM | POA: Diagnosis not present

## 2024-02-01 DIAGNOSIS — D62 Acute posthemorrhagic anemia: Secondary | ICD-10-CM | POA: Diagnosis present

## 2024-02-01 DIAGNOSIS — E669 Obesity, unspecified: Secondary | ICD-10-CM | POA: Diagnosis present

## 2024-02-01 DIAGNOSIS — F419 Anxiety disorder, unspecified: Secondary | ICD-10-CM | POA: Diagnosis present

## 2024-02-01 DIAGNOSIS — Z79899 Other long term (current) drug therapy: Secondary | ICD-10-CM

## 2024-02-01 DIAGNOSIS — E119 Type 2 diabetes mellitus without complications: Secondary | ICD-10-CM | POA: Diagnosis not present

## 2024-02-01 DIAGNOSIS — L299 Pruritus, unspecified: Secondary | ICD-10-CM | POA: Diagnosis not present

## 2024-02-01 DIAGNOSIS — Z91048 Other nonmedicinal substance allergy status: Secondary | ICD-10-CM | POA: Diagnosis not present

## 2024-02-01 DIAGNOSIS — Z7982 Long term (current) use of aspirin: Secondary | ICD-10-CM

## 2024-02-01 DIAGNOSIS — E1142 Type 2 diabetes mellitus with diabetic polyneuropathy: Secondary | ICD-10-CM | POA: Diagnosis present

## 2024-02-01 DIAGNOSIS — M4310 Spondylolisthesis, site unspecified: Secondary | ICD-10-CM | POA: Diagnosis present

## 2024-02-01 DIAGNOSIS — Z808 Family history of malignant neoplasm of other organs or systems: Secondary | ICD-10-CM

## 2024-02-01 DIAGNOSIS — T148XXA Other injury of unspecified body region, initial encounter: Secondary | ICD-10-CM | POA: Diagnosis not present

## 2024-02-01 DIAGNOSIS — Z741 Need for assistance with personal care: Secondary | ICD-10-CM | POA: Diagnosis present

## 2024-02-01 DIAGNOSIS — Z818 Family history of other mental and behavioral disorders: Secondary | ICD-10-CM

## 2024-02-01 DIAGNOSIS — G2581 Restless legs syndrome: Secondary | ICD-10-CM | POA: Diagnosis present

## 2024-02-01 DIAGNOSIS — E871 Hypo-osmolality and hyponatremia: Secondary | ICD-10-CM | POA: Diagnosis present

## 2024-02-01 DIAGNOSIS — F40240 Claustrophobia: Secondary | ICD-10-CM | POA: Diagnosis present

## 2024-02-01 DIAGNOSIS — Z825 Family history of asthma and other chronic lower respiratory diseases: Secondary | ICD-10-CM

## 2024-02-01 LAB — GLUCOSE, CAPILLARY
Glucose-Capillary: 214 mg/dL — ABNORMAL HIGH (ref 70–99)
Glucose-Capillary: 190 mg/dL — ABNORMAL HIGH (ref 70–99)
Glucose-Capillary: 171 mg/dL — ABNORMAL HIGH (ref 70–99)
Glucose-Capillary: 166 mg/dL — ABNORMAL HIGH (ref 70–99)

## 2024-02-01 LAB — PREPARE RBC (CROSSMATCH)

## 2024-02-01 MED ORDER — CITALOPRAM HYDROBROMIDE 20 MG PO TABS
40.0000 mg | ORAL_TABLET | Freq: Every day | ORAL | Status: DC
Start: 2024-02-02 — End: 2024-02-13
  Administered 2024-02-02 – 2024-02-13 (×12): 40 mg via ORAL
  Filled 2024-02-01 (×12): qty 2

## 2024-02-01 MED ORDER — ZOLPIDEM TARTRATE 5 MG PO TABS: 5.0000 mg | ORAL_TABLET | Freq: Every evening | ORAL | Status: DC | PRN

## 2024-02-01 MED ORDER — INSULIN ASPART 100 UNIT/ML IJ SOLN
0.0000 [IU] | Freq: Three times a day (TID) | INTRAMUSCULAR | Status: DC
  Administered 2024-02-01 – 2024-02-02 (×4): 3 [IU] via SUBCUTANEOUS
  Administered 2024-02-03: 5 [IU] via SUBCUTANEOUS
  Administered 2024-02-03: 3 [IU] via SUBCUTANEOUS
  Administered 2024-02-03 – 2024-02-04 (×2): 5 [IU] via SUBCUTANEOUS
  Administered 2024-02-04 (×2): 3 [IU] via SUBCUTANEOUS
  Administered 2024-02-05: 5 [IU] via SUBCUTANEOUS
  Administered 2024-02-05 (×2): 3 [IU] via SUBCUTANEOUS
  Administered 2024-02-06: 2 [IU] via SUBCUTANEOUS
  Administered 2024-02-06: 3 [IU] via SUBCUTANEOUS
  Administered 2024-02-06 – 2024-02-07 (×3): 5 [IU] via SUBCUTANEOUS
  Administered 2024-02-07: 3 [IU] via SUBCUTANEOUS
  Administered 2024-02-08: 5 [IU] via SUBCUTANEOUS
  Administered 2024-02-08 (×2): 8 [IU] via SUBCUTANEOUS
  Administered 2024-02-09: 5 [IU] via SUBCUTANEOUS
  Administered 2024-02-09: 3 [IU] via SUBCUTANEOUS
  Administered 2024-02-09: 8 [IU] via SUBCUTANEOUS
  Administered 2024-02-10: 11 [IU] via SUBCUTANEOUS
  Administered 2024-02-10: 3 [IU] via SUBCUTANEOUS
  Administered 2024-02-10: 11 [IU] via SUBCUTANEOUS
  Administered 2024-02-11: 2 [IU] via SUBCUTANEOUS
  Administered 2024-02-11: 5 [IU] via SUBCUTANEOUS
  Administered 2024-02-11: 3 [IU] via SUBCUTANEOUS
  Administered 2024-02-12: 2 [IU] via SUBCUTANEOUS
  Administered 2024-02-12: 5 [IU] via SUBCUTANEOUS
  Administered 2024-02-12: 2 [IU] via SUBCUTANEOUS
  Administered 2024-02-13: 3 [IU] via SUBCUTANEOUS

## 2024-02-01 MED ORDER — CLONAZEPAM 0.5 MG PO TABS
0.5000 mg | ORAL_TABLET | Freq: Two times a day (BID) | ORAL | Status: DC | PRN
  Administered 2024-02-06 – 2024-02-12 (×7): 0.5 mg via ORAL
  Filled 2024-02-01 (×8): qty 1

## 2024-02-01 MED ORDER — METOPROLOL SUCCINATE ER 25 MG PO TB24
25.0000 mg | ORAL_TABLET | Freq: Two times a day (BID) | ORAL | Status: DC
  Administered 2024-02-01 – 2024-02-13 (×24): 25 mg via ORAL
  Filled 2024-02-01 (×25): qty 1

## 2024-02-01 MED ORDER — PRAMIPEXOLE DIHYDROCHLORIDE 1 MG PO TABS
1.0000 mg | ORAL_TABLET | Freq: Every day | ORAL | Status: DC
  Administered 2024-02-01 – 2024-02-10 (×9): 1 mg via ORAL
  Filled 2024-02-01 (×11): qty 1

## 2024-02-01 MED ORDER — ENSURE ENLIVE PO LIQD
237.0000 mL | Freq: Two times a day (BID) | ORAL | Status: DC
  Administered 2024-02-02 – 2024-02-10 (×4): 237 mL via ORAL

## 2024-02-01 MED ORDER — BISACODYL 10 MG RE SUPP: 10.0000 mg | Freq: Every day | RECTAL | Status: DC | PRN

## 2024-02-01 MED ORDER — DOCUSATE SODIUM 100 MG PO CAPS
100.0000 mg | ORAL_CAPSULE | Freq: Two times a day (BID) | ORAL | Status: DC
  Administered 2024-02-01 – 2024-02-13 (×22): 100 mg via ORAL
  Filled 2024-02-01 (×24): qty 1

## 2024-02-01 MED ORDER — SODIUM CHLORIDE 0.9% IV SOLUTION: Freq: Once | INTRAVENOUS | Status: AC

## 2024-02-01 MED ORDER — ACETAMINOPHEN 650 MG RE SUPP: 650.0000 mg | RECTAL | Status: DC | PRN

## 2024-02-01 MED ORDER — CYCLOBENZAPRINE HCL 5 MG PO TABS
10.0000 mg | ORAL_TABLET | Freq: Three times a day (TID) | ORAL | Status: DC | PRN
  Administered 2024-02-01 – 2024-02-09 (×5): 10 mg via ORAL
  Filled 2024-02-01 (×8): qty 2

## 2024-02-01 MED ORDER — GABAPENTIN 100 MG PO CAPS
200.0000 mg | ORAL_CAPSULE | Freq: Every day | ORAL | Status: DC
  Administered 2024-02-01 – 2024-02-12 (×12): 200 mg via ORAL
  Filled 2024-02-01 (×12): qty 2

## 2024-02-01 MED ORDER — FERROUS SULFATE 325 (65 FE) MG PO TABS
325.0000 mg | ORAL_TABLET | Freq: Every day | ORAL | Status: DC
  Administered 2024-02-02 – 2024-02-13 (×12): 325 mg via ORAL
  Filled 2024-02-01 (×12): qty 1

## 2024-02-01 MED ORDER — METFORMIN HCL ER 500 MG PO TB24
1000.0000 mg | ORAL_TABLET | Freq: Every day | ORAL | Status: DC
  Administered 2024-02-02 – 2024-02-13 (×12): 1000 mg via ORAL
  Filled 2024-02-01 (×12): qty 2

## 2024-02-01 MED ORDER — DOCUSATE SODIUM 100 MG PO CAPS: 100.0000 mg | ORAL_CAPSULE | Freq: Two times a day (BID) | ORAL | Status: DC

## 2024-02-01 MED ORDER — ONDANSETRON HCL 4 MG/2ML IJ SOLN: 4.0000 mg | Freq: Four times a day (QID) | INTRAMUSCULAR | Status: DC | PRN

## 2024-02-01 MED ORDER — ACETAMINOPHEN 325 MG PO TABS
650.0000 mg | ORAL_TABLET | ORAL | Status: DC | PRN
  Administered 2024-02-01 – 2024-02-08 (×4): 650 mg via ORAL
  Filled 2024-02-01 (×5): qty 2

## 2024-02-01 MED ORDER — FUROSEMIDE 40 MG PO TABS
40.0000 mg | ORAL_TABLET | Freq: Every day | ORAL | Status: DC
  Administered 2024-02-02 – 2024-02-12 (×11): 40 mg via ORAL
  Filled 2024-02-01 (×11): qty 1

## 2024-02-01 MED ORDER — ATORVASTATIN CALCIUM 80 MG PO TABS
80.0000 mg | ORAL_TABLET | Freq: Every day | ORAL | Status: DC
  Administered 2024-02-02 – 2024-02-13 (×12): 80 mg via ORAL
  Filled 2024-02-01 (×12): qty 1

## 2024-02-01 MED ORDER — POTASSIUM CHLORIDE CRYS ER 20 MEQ PO TBCR
20.0000 meq | EXTENDED_RELEASE_TABLET | Freq: Every day | ORAL | Status: DC
  Administered 2024-02-02 – 2024-02-13 (×12): 20 meq via ORAL
  Filled 2024-02-01 (×12): qty 1

## 2024-02-01 MED ORDER — OXYCODONE-ACETAMINOPHEN 5-325 MG PO TABS: 1.0000 | ORAL_TABLET | ORAL | Status: DC | PRN

## 2024-02-01 MED ORDER — OXYCODONE HCL 5 MG PO TABS
10.0000 mg | ORAL_TABLET | ORAL | Status: DC | PRN
  Administered 2024-02-01 – 2024-02-13 (×42): 10 mg via ORAL
  Filled 2024-02-01 (×44): qty 2

## 2024-02-01 MED ORDER — GABAPENTIN 100 MG PO CAPS
100.0000 mg | ORAL_CAPSULE | Freq: Every day | ORAL | Status: DC
  Administered 2024-02-02 – 2024-02-13 (×12): 100 mg via ORAL
  Filled 2024-02-01 (×12): qty 1

## 2024-02-01 MED ORDER — OXYCODONE HCL 5 MG PO TABS
5.0000 mg | ORAL_TABLET | ORAL | Status: DC | PRN
Start: 2024-02-01 — End: 2024-02-13
  Administered 2024-02-03: 5 mg via ORAL
  Filled 2024-02-01: qty 1

## 2024-02-01 MED ORDER — ONDANSETRON HCL 4 MG PO TABS: 4.0000 mg | ORAL_TABLET | Freq: Four times a day (QID) | ORAL | Status: DC | PRN

## 2024-02-01 NOTE — H&P (Signed)
 Physical Medicine and Rehabilitation Admission H&P       HPI: Stacy Frost is a 63 year old right-handed female with history significant for  diabetes mellitus with peripheral neuropathy, anxiety, aortic stenosis/MVP status post aortic valve replacement July 2024 per Dr. Cliffton Asters, hypertension, chronic anemia, hyperlipidemia, migraine headaches, restless leg syndrome.  Per chart review patient lives with spouse.  1 level home one-step to entry.  Used a four-wheel rolling walker for mobility and was able to perform her own ADLs.  Spouse just retired and can provide assistance as needed.  Presented 01/28/2024 with increasing low back and bilateral buttock/leg pain consistent with neurogenic claudication.  MRI revealed multilevel spondylolisthesis and stenosis compressing the L2, L3, L4, and L5 nerve roots.  Underwent elective bilateral L2-3, L3-4 and L4-5 laminotomy/foraminotomies/medial facetectomies to decompress the L2-L5 nerve root as well as PLIF L2-L5 by Dr. Tressie Stalker 01/28/2024.  Postoperatively lumbar corset applied in sitting position when out of bed.  Pain management with the use of scheduled Neurontin with Flexeril and oxycodone as needed.  Foley catheter tube removed for 12/20/2023 as well as Hemovac drain.  She did spike a fever postoperatively on 01/31/2024 and did receive Tylenol..  Follow-up labs performed showing a WBC of 14,400 as well as hemoglobin 7.3 from a preop of 13.2, sodium 134.  Urinalysis negative nitrite and respiratory panel negative,CxR negative.  Blood cultures pending. She was transfused one unit PRBC 4/4  Therapy evaluations completed due to patient decreased functional mobility was admitted for a comprehensive rehab program.   Review of Systems  Constitutional:  Positive for malaise/fatigue. Negative for chills and fever.  HENT:  Negative for hearing loss.   Eyes:  Negative for blurred vision and double vision.  Respiratory:  Negative for cough, shortness of breath and  wheezing.   Cardiovascular:  Negative for chest pain, palpitations and leg swelling.  Gastrointestinal:        GERD  Genitourinary:  Negative for dysuria, flank pain and hematuria.  Musculoskeletal:  Positive for back pain and myalgias.  Skin:  Negative for rash.  Neurological:  Positive for sensory change, focal weakness, weakness and headaches.       Restless leg syndrome  Psychiatric/Behavioral:         Anxiety  All other systems reviewed and are negative.       Past Medical History:  Diagnosis Date   Anxiety     Aortic stenosis      Mild to moderate   Bicuspid aortic valve     Essential hypertension, benign     GERD (gastroesophageal reflux disease)     Heart murmur      S/P Aortic Valve Replacement July 2024   History of cardiac catheterization      Normal coronaries 2008   Hyperlipidemia     Migraine headache     MVP (mitral valve prolapse)     Obesity     Pain in joint, ankle and foot      Chronic   Palpitations      Documented PACs by previous monitoring   Restless leg syndrome     Skin tag     Type 2 diabetes mellitus (HCC)     Unilateral primary osteoarthritis, right knee 09/06/2016             Past Surgical History:  Procedure Laterality Date   ACHILLES TENDON REPAIR   2003    Left   AORTIC VALVE REPLACEMENT N/A 05/14/2023    Procedure: AORTIC VALVE REPLACEMENT (AVR);  Surgeon: Corliss Skains, MD;  Location: [facility] OR;  Service: Open Heart Surgery;  Laterality: N/A;   CARDIAC CATHETERIZATION   2008    Normal coronary arteries; normal LV systolic function; mild dilation of aortic root; mild dilation of proximal ascending aorta; likely bicuspid aortic valve   CHOLECYSTECTOMY   2001    "Poor EF at 17%"   COLONOSCOPY N/A 07/17/2014    Procedure: COLONOSCOPY;  Surgeon: Malissa Hippo, MD;  Location: [facility] END[facility] SUITE;  Service: Endoscopy;  Laterality: N/A;  105   ESOPHAGEAL DILATION N/A 06/11/2015    Procedure: ESOPHAGEAL DILATION;  Surgeon: Malissa Hippo, MD;  Location: [facility] ORS;  Service: Endoscopy;  Laterality: N/AElease Hashimoto 54/56/58   ESOPHAGOGASTRODUODENOSCOPY N/A 07/17/2014    Procedure: ESOPHAGOGASTRODUODENOSCOPY (EGD);  Surgeon: Malissa Hippo, MD;  Location: [facility];  Service: Endoscopy;  Laterality: N/A;   ESOPHAGOGASTRODUODENOSCOPY (EGD) WITH PROPOFOL N/A 06/11/2015    Procedure: ESOPHAGOGASTRODUODENOSCOPY (EGD) WITH PROPOFOL;  Surgeon: Malissa Hippo, MD;  Location: [facility] ORS;  Service: Endoscopy;  Laterality: N/A;  730   KNEE ARTHROSCOPY WITH MEDIAL MENISECTOMY Right 07/02/2018    Procedure: RIGHT KNEE ARTHROSCOPY, DEBRIDEMENT, MEDIAL MENISECTOMY;  Surgeon: [name], MD;  Location: [facility] OR;  Service: Orthopedics;  Laterality: Right;   KNEE SURGERY       MALONEY DILATION N/A 07/17/2014    Procedure: MALONEY DILATION;  Surgeon: Malissa Hippo, MD;  Location: [facility];  Service: Endoscopy;  Laterality: N/A;   RIGHT/LEFT HEART CATH AND CORONARY ANGIOGRAPHY N/A 05/02/2023    Procedure: RIGHT/LEFT HEART CATH AND CORONARY ANGIOGRAPHY;  Surgeon: Swazil[place], Peter M, MD;  Location: [facility] INVASIVE CV LAB;  Service: Cardiovascular;  Laterality: N/A;   TEE WITHOUT CARDIOVERSION N/A 05/14/2023    Procedure: TRANSESOPHAGEAL ECHOCARDIOGRAM;  Surgeon: Corliss Skains, MD;  Location: [facility] OR;  Service: Open Heart Surgery;  Laterality: N/A;   UMBILICAL HERNIA REPAIR   2003             Family History  Problem Relation Age of Onset   Depression Mother     Cancer Mother          Lung mets (unsure as to what kind)   Diabetes Mother     Hypertension Father     Depression Father     Heart attack Father     Depression Daughter     Anxiety disorder Daughter     Stroke Daughter     Transient ischemic attack Brother     Emphysema Maternal Grandfather     Brain cancer Paternal Grandmother     Heart attack Paternal Grandfather          Social History:  reports that she has never smoked. She has never used smokeless tobacco.  She reports that she does not drink alcohol and does not use drugs. Allergies:  Allergies       Allergies  Allergen Reactions   Amlodipine Besylate Shortness Of Breath   Morphine And Codeine Shortness Of Breath and Other (See Comments)      Chest pain   Amitriptyline Other (See Comments)      Unknown reaction   Butorphanol Tartrate Other (See Comments)      REACTION: ED visit and got for migraine - not sure of response   Compazine [Prochlorperazine Edisylate] Other (See Comments)      Altered mental status   Rocephin [Ceftriaxone Sodium In Dextrose]        Given at GYN  for infection - nauseated, dizziness (w/in last 7-8 years)   Sumatriptan Other (See Comments)      REACTION: HTN            Medications Prior to Admission  Medication Sig Dispense Refill   acetaminophen-codeine (TYLENOL #3) 300-30 MG tablet Take 1 tablet by mouth 2 (two) times daily as needed for moderate pain (pain score 4-6) (migraines).       atorvastatin (LIPITOR) 80 MG tablet TAKE 1 TABLET BY MOUTH ONCE DAILY 90 tablet 3   celecoxib (CELEBREX) 200 MG capsule Take 200 mg by mouth 2 (two) times daily.       citalopram (CELEXA) 40 MG tablet Take 40 mg by mouth daily.       cyclobenzaprine (FLEXERIL) 10 MG tablet Take 10 mg by mouth 3 (three) times daily as needed for muscle spasms.       ferrous sulfate 325 (65 FE) MG tablet Take 1 tablet (325 mg total) by mouth daily with breakfast. 60 tablet 1   furosemide (LASIX) 40 MG tablet Take 0.5 tablets (20 mg total) by mouth daily as needed (for swelling and weight gain). (Patient taking differently: Take 40 mg by mouth daily.) 30 tablet 5   gabapentin (NEURONTIN) 100 MG capsule Take 100 mg by mouth 3 (three) times daily. Take 100mg  (1 capsule) by mouth every morning and 200mg  (2 capsules) in the evening.       insulin glargine-yfgn (SEMGLEE, YFGN,) 100 UNIT/ML Pen Inject 30 Units into the skin at bedtime. (Patient taking differently: Inject 40 Units into the skin at  bedtime.) 27 mL 3   metFORMIN (GLUCOPHAGE-XR) 500 MG 24 hr tablet TAKE 2 TABLETS BY MOUTH DAILY WITH BREAKFAST 180 tablet 1   metoprolol succinate (TOPROL-XL) 25 MG 24 hr tablet Take 1 tablet (25 mg total) by mouth 2 (two) times daily at 10 AM and 5 PM. Take with or immediately following a meal.       MOUNJARO 7.5 MG/0.5ML Pen INJECT 7.5 MG UNDER THE SKIN WEEKLY 6 mL 1   potassium chloride SA (KLOR-CON M) 20 MEQ tablet TAKE 1 TABLET BY MOUTH DAILY 30 tablet 0   pramipexole (MIRAPEX) 0.5 MG tablet Take 1 mg by mouth at bedtime.       promethazine (PHENERGAN) 25 MG tablet Take 25 mg by mouth 2 (two) times daily as needed.       traZODone (DESYREL) 150 MG tablet Take 150 mg by mouth at bedtime.       aspirin EC 325 MG tablet Take 1 tablet (325 mg total) by mouth daily.       clonazePAM (KLONOPIN) 0.5 MG tablet Take 0.5 mg by mouth 2 (two) times daily as needed for anxiety.       Continuous Blood Gluc Receiver (DEXCOM G7 RECEIVER) DEVI USE 1 receiver continuously       Continuous Blood Gluc Sensor (DEXCOM G7 SENSOR) MISC APPLY 1 SENSOR EVERY 10 DAYS                  Home: Home Living Family/patient expects to be discharged to:: Private residence Living Arrangements: Spouse/significant other Available Help at Discharge: Family Type of Home: House Home Access: Stairs to enter Secretary/administrator of Steps: 1&1 Entrance Stairs-Rails: None Home Layout: One level Bathroom Shower/Tub: Engineer, manufacturing systems: Standard Bathroom Accessibility: No Home Equipment: Information systems manager, BSC/3in1, Rollator (4 wheels)   Functional History: Prior Function Prior Level of Function : Independent/Modified Independent   Functional Status:  Mobility: Bed Mobility Overal bed mobility: Needs Assistance Bed Mobility: Sit to Sidelying, Rolling, Sidelying to Sit Rolling: Contact guard assist Sidelying to sit: Min assist Sit to sidelying: Min assist General bed mobility comments: Verbal cues for  sequencing. Min A to get trunk to midline and to Get LE back into the bed. Transfers Overall transfer level: Needs assistance Equipment used: Rolling walker (2 wheels) Transfers: Sit to/from Stand Sit to Stand: Min assist Bed to/from chair/wheelchair/BSC transfer type:: Step pivot Step pivot transfers: Mod assist, +2 safety/equipment Transfer via Lift Equipment: Stedy General transfer comment: verbal cues for safe hand placement. No knee blocking required today. Mod to heavy assist on RW. Ambulation/Gait Ambulation/Gait assistance: Mod assist Gait Distance (Feet): 20 Feet Assistive device: Rolling walker (2 wheels) Gait Pattern/deviations: Step-to pattern, Decreased stance time - right, Decreased step length - left, Knees buckling General Gait Details: Pt requires verbal cues intermittently to progress the LLE due to trailing. Min - Mod A throughout with occasional buckling at the L Knee requiring increased assist for balance. Gait velocity: decreased Gait velocity interpretation: <1.31 ft/sec, indicative of household ambulator Pre-gait activities: Worked on wgt shifting, TKE, side stepping and steps EOB<>BSC with step by step sequencing. Initially Max A to block L knee improving to Min A intermittently   ADL: ADL Overall ADL's : Needs assistance/impaired Eating/Feeding: Independent, Sitting Grooming: Wash/dry hands, Wash/dry face, Set up, Sitting Upper Body Bathing: Set up, Sitting Lower Body Bathing: Moderate assistance, Sitting/lateral leans Upper Body Dressing : Set up, Sitting Lower Body Dressing: Moderate assistance, Sitting/lateral leans Toilet Transfer: Moderate assistance, Transfer board, BSC/3in1   Cognition: Cognition Orientation Level: Oriented X4 Cognition Arousal: Alert Behavior During Therapy: WFL for tasks assessed/performed   Physical Exam: Blood pressure (!) 148/72, pulse 95, temperature 99.7 F (37.6 C), temperature source Oral, resp. rate 18, height 5\' 6"   (1.676 m), weight 87.1 kg, SpO2 99%. Physical Exam Constitutional:      General: She is not in acute distress. HENT:     Head: Normocephalic.     Right Ear: External ear normal.     Left Ear: External ear normal.     Nose: Nose normal.     Mouth/Throat:     Pharynx: Oropharynx is clear.  Eyes:     Extraocular Movements: Extraocular movements intact.     Conjunctiva/sclera: Conjunctivae normal.     Pupils: Pupils are equal, round, and reactive to light.  Cardiovascular:     Rate and Rhythm: Normal rate.     Heart sounds: No murmur heard.    No gallop.  Pulmonary:     Effort: Pulmonary effort is normal. No respiratory distress.     Breath sounds: No wheezing.  Abdominal:     General: Bowel sounds are normal. There is no distension.     Palpations: Abdomen is soft.     Tenderness: There is no abdominal tenderness.  Musculoskeletal:     Cervical back: Normal range of motion.     Comments: Low back tender to palpation and attempts at trunk movement. Pt wearing LSO  Skin:    General: Skin is warm.     Comments: Back incision Clean and intact  Neurological:     Mental Status: She is alert.     Comments: Alert and oriented x 3. Normal insight and awareness. Intact Memory. Normal language and speech. Cranial nerve exam unremarkable. MMT: RUE 5/5. LUE 4+/5 prox to distal. RLE HF 2+/5 with pain inhibition, KE 3+, ADF/PF 4/5. LLE 2- to  2+/5 HF KE  with pain component to weakness and 1+ to 2/5 ADF/PF. Decreased LT and PP bilateral LE's below knee. Intact saddle sensation. DTR's tr-1+. No abnl resting tone. No cerebellar signs. Marland Kitchen    Psychiatric:     Comments: Pleasant but sl anxious about therapy ahead tomorrow        Lab Results Last 48 Hours        Results for orders placed or performed during the hospital encounter of 01/28/24 (from the past 48 hours)  Glucose, capillary     Status: Abnormal    Collection Time: 01/30/24  6:20 AM  Result Value Ref Range    Glucose-Capillary 213 (H)  70 - 99 mg/dL      Comment: Glucose reference range applies only to samples taken after fasting for at least 8 hours.    Comment 1 Notify RN    Glucose, capillary     Status: Abnormal    Collection Time: 01/30/24 11:49 AM  Result Value Ref Range    Glucose-Capillary 230 (H) 70 - 99 mg/dL      Comment: Glucose reference range applies only to samples taken after fasting for at least 8 hours.  Glucose, capillary     Status: Abnormal    Collection Time: 01/30/24  4:26 PM  Result Value Ref Range    Glucose-Capillary 214 (H) 70 - 99 mg/dL      Comment: Glucose reference range applies only to samples taken after fasting for at least 8 hours.  Glucose, capillary     Status: Abnormal    Collection Time: 01/30/24  9:32 PM  Result Value Ref Range    Glucose-Capillary 178 (H) 70 - 99 mg/dL      Comment: Glucose reference range applies only to samples taken after fasting for at least 8 hours.    Comment 1 Notify RN    Glucose, capillary     Status: Abnormal    Collection Time: 01/31/24  6:25 AM  Result Value Ref Range    Glucose-Capillary 239 (H) 70 - 99 mg/dL      Comment: Glucose reference range applies only to samples taken after fasting for at least 8 hours.    Comment 1 Notify RN    Basic metabolic panel     Status: Abnormal    Collection Time: 01/31/24 11:44 AM  Result Value Ref Range    Sodium 134 (L) 135 - 145 mmol/L    Potassium 3.7 3.5 - 5.1 mmol/L    Chloride 98 98 - 111 mmol/L    CO2 23 22 - 32 mmol/L    Glucose, Bld 259 (H) 70 - 99 mg/dL      Comment: Glucose reference range applies only to samples taken after fasting for at least 8 hours.    BUN 12 8 - 23 mg/dL    Creatinine, Ser 1.61 0.44 - 1.00 mg/dL    Calcium 8.6 (L) 8.9 - 10.3 mg/dL    GFR, Estimated >09 >60 mL/min      Comment: (NOTE) Calculated using the CKD-EPI Creatinine Equation (2021)      Anion gap 13 5 - 15      Comment: Performed at [facility], [address], [place], [place] [address]  Glucose,  capillary     Status: Abnormal    Collection Time: 01/31/24 12:36 PM  Result Value Ref Range    Glucose-Capillary 241 (H) 70 - 99 mg/dL      Comment: Glucose reference range  applies only to samples taken after fasting for at least 8 hours.  CBC with Differential/Platelet     Status: Abnormal    Collection Time: 01/31/24 12:53 PM  Result Value Ref Range    WBC 14.4 (H) 4.0 - 10.5 K/uL    RBC 2.52 (L) 3.87 - 5.11 MIL/uL    Hemoglobin 7.3 (L) 12.0 - 15.0 g/dL      Comment: REPEATED TO VERIFY    HCT 21.4 (L) 36.0 - 46.0 %    MCV 84.9 80.0 - 100.0 fL    MCH 29.0 26.0 - 34.0 pg    MCHC 34.1 30.0 - 36.0 g/dL    RDW 16.1 09.6 - 04.5 %    Platelets 176 150 - 400 K/uL    nRBC 0.0 0.0 - 0.2 %    Neutrophils Relative % 86 %    Neutro Abs 12.4 (H) 1.7 - 7.7 K/uL    Lymphocytes Relative 7 %    Lymphs Abs 1.0 0.7 - 4.0 K/uL    Monocytes Relative 6 %    Monocytes Absolute 0.9 0.1 - 1.0 K/uL    Eosinophils Relative 0 %    Eosinophils Absolute 0.0 0.0 - 0.5 K/uL    Basophils Relative 0 %    Basophils Absolute 0.0 0.0 - 0.1 K/uL    Immature Granulocytes 1 %    Abs Immature Granulocytes 0.08 (H) 0.00 - 0.07 K/uL      Comment: Performed at [facility], [address] [place], [place] [address]  Urinalysis, Routine w reflex microscopic -Urine, Clean Catch     Status: Abnormal    Collection Time: 01/31/24  2:22 PM  Result Value Ref Range    Color, Urine YELLOW YELLOW    APPearance CLEAR CLEAR    Specific Gravity, Urine 1.015 1.005 - 1.030    pH 6.0 5.0 - 8.0    Glucose, UA 100 (A) NEGATIVE mg/dL    Hgb urine dipstick NEGATIVE NEGATIVE    Bilirubin Urine NEGATIVE NEGATIVE    Ketones, ur NEGATIVE NEGATIVE mg/dL    Protein, ur NEGATIVE NEGATIVE mg/dL    Nitrite NEGATIVE NEGATIVE    Leukocytes,Ua NEGATIVE NEGATIVE      Comment: Microscopic not done on urines with negative protein, blood, leukocytes, nitrite, or glucose < 500 mg/dL. Performed at [facility], [address]  [place], [place] [address]    Respiratory (~20 pathogens) panel by PCR     Status: None    Collection Time: 01/31/24  3:53 PM    Specimen: Nasopharyngeal Swab; Respiratory  Result Value Ref Range    Adenovirus NOT DETECTED NOT DETECTED    Coronavirus 229E NOT DETECTED NOT DETECTED      Comment: (NOTE) The Coronavirus on the Respiratory Panel, DOES NOT test for the novel  Coronavirus (2019 nCoV)      Coronavirus HKU1 NOT DETECTED NOT DETECTED    Coronavirus NL63 NOT DETECTED NOT DETECTED    Coronavirus OC43 NOT DETECTED NOT DETECTED    Metapneumovirus NOT DETECTED NOT DETECTED    Rhinovirus / Enterovirus NOT DETECTED NOT DETECTED    Influenza A NOT DETECTED NOT DETECTED    Influenza B NOT DETECTED NOT DETECTED    Parainfluenza Virus 1 NOT DETECTED NOT DETECTED    Parainfluenza Virus 2 NOT DETECTED NOT DETECTED    Parainfluenza Virus 3 NOT DETECTED NOT DETECTED    Parainfluenza Virus 4 NOT DETECTED NOT DETECTED    Respiratory Syncytial Virus NOT DETECTED  NOT DETECTED    Bordetella pertussis NOT DETECTED NOT DETECTED    Bordetella Parapertussis NOT DETECTED NOT DETECTED    Chlamydophila pneumoniae NOT DETECTED NOT DETECTED    Mycoplasma pneumoniae NOT DETECTED NOT DETECTED      Comment: Performed at [facility], [address] [place], [place] [address]  Glucose, capillary     Status: Abnormal    Collection Time: 01/31/24  4:03 PM  Result Value Ref Range    Glucose-Capillary 154 (H) 70 - 99 mg/dL      Comment: Glucose reference range applies only to samples taken after fasting for at least 8 hours.    Comment 1 Notify RN    Glucose, capillary     Status: Abnormal    Collection Time: 01/31/24  9:19 PM  Result Value Ref Range    Glucose-Capillary 253 (H) 70 - 99 mg/dL      Comment: Glucose reference range applies only to samples taken after fasting for at least 8 hours.    Comment 1 Notify RN      Comment 2 Document in Chart         Imaging Results (Last 48 hours)  DG CHEST  PORT 1 VIEW Result Date: 01/31/2024 CLINICAL DATA:  Hypoxia EXAM: PORTABLE CHEST 1 VIEW COMPARISON:  09/26/2023 FINDINGS: Single frontal view of the chest demonstrates stable postsurgical changes from median sternotomy and aortic valve replacement. Cardiac silhouette is unremarkable. No acute airspace disease, effusion, or pneumothorax. No acute bony abnormalities. IMPRESSION: 1. No acute intrathoracic process. Electronically Signed   By: Sharlet Salina M.D.   On: 01/31/2024 16:19           Blood pressure (!) 148/72, pulse 95, temperature 99.7 F (37.6 C), temperature source Oral, resp. rate 18, height 5\' 6"  (1.676 m), weight 87.1 kg, SpO2 99%.   Medical Problem List and Plan: 1. 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   -Back corset when out of bed             -patient may shower             -ELOS/Goals: 9-14 days 2.  Antithrombotics: -DVT/anticoagulation:  Mechanical: Antiembolism stockings, thigh (TED hose) Bilateral lower extremities             -antiplatelet therapy: N/A 3. Pain Management: Neurontin 100 mg daily and 200 mg nightly, Flexeril and oxycodone as needed             -might benefit from scheduled oxycodone prior to therapies 4. Mood/Behavior/Sleep: Celexa 40 mg daily, Klonopin as needed             -antipsychotic agents: N/A             -pt with anxiety about therapy tomorrow. Reassured her that team will work with her at her pace and be considerate of her pain. I also told her family could stay over with her if it helped her anxiety.  5. Neuropsych/cognition: This patient is capable of making decisions on her own behalf. 6. Skin/Wound Care: Routine skin checks 7. Fluids/Electrolytes/Nutrition: Routine in and outs                -mild hyponatremia on recent labs-             -f/u labs Monday  8.  Acute blood loss anemia on chronic anemia:        -pt transfused 1u  PRBC  today for hgb of 7.3 - Follow-up CBC in AM -fe++ supp 9.  History aortic stenosis/MVP.  Status post aortic valve replacement July 2024 per Dr. Cliffton Asters 10.  Hypertension.  Toprol-XL 25 mg twice daily, Lasix 40 mg daily.  Monitor with increased mobility             -controlled at present 11.  Hyperlipidemia.  Lipitor 12.  Diabetes mellitus with peripheral neuropathy.  Latest hemoglobin A1c 9.7.                -sugars poorly controlled at present as well              -pt is on metformin XR 500mg  2 tablets daily as she takes at home  -SSI  -might need adjustments to her standing regimen  14.  Hyperlipidemia.  Lipitor 15.  Restless leg syndrome.  Mirapex 1 mg nightly 16.  Obesity.  BMI 30.99.  Dietary follow-up     Charlton Amor, PA-C 02/01/2024  I have personally performed a face to face diagnostic evaluation of this patient and formulated the key components of the plan.  Additionally, I have personally reviewed laboratory data, imaging studies, as well as relevant notes and concur with the physician assistant's documentation above.  The patient's status has not changed from the original H&P.  Any changes in documentation from the acute care chart have been noted above.  Ranelle Oyster, MD, [place]

## 2024-02-01 NOTE — Plan of Care (Signed)
  Problem: Clinical Measurements: Goal: Will remain free from infection Outcome: Not Progressing   Problem: Activity: Goal: Risk for activity intolerance will decrease Outcome: Not Progressing   Problem: Elimination: Goal: Will not experience complications related to bowel motility Outcome: Not Progressing   Problem: Safety: Goal: Ability to remain free from injury will improve Outcome: Not Progressing   Problem: Pain Managment: Goal: General experience of comfort will improve and/or be controlled Outcome: Not Progressing

## 2024-02-01 NOTE — Progress Notes (Signed)
 Occupational Therapy Treatment Patient Details Name: Anahita Cua MRN: [id number] DOB: 12/29/1960 Today's Date: 02/01/2024   History of present illness Pt is a 63 y/o female who presents s/p L2-L5 PLIF on  01/28/2024. Pt with post-op weakness and decreased sensation in BLE's. PMH includes: recent CABG, DM, HTN.   OT comments  Pt. Seen for skilled OT treatment session with son present. Pt. Required cues and demo for recall of back precautions.  Able to complete bed mobility with verbal and physical assistance of min/mod a.  Lb dressing with use of A/E with min a.  Able to don brace in sitting with set up.  Short distance ambulation around bed to recliner with MOD A.  Pt. Ambulating not in full weight bearing of LLE more on her toe with heel up.  Cues for use of BUEs to aide in support during ambulation.  Pt. Agreeable to up in chair at end of session.  Reports feeling very tired after ambulating stated BLE weakness as main reason.  Motivated and eager for continued therapy.        If plan is discharge home, recommend the following:  A lot of help with bathing/dressing/bathroom;A lot of help with walking and/or transfers;Assist for transportation;Assistance with cooking/housework;Help with stairs or ramp for entrance   Equipment Recommendations  Tub/shower bench    Recommendations for Other Services      Precautions / Restrictions Precautions Precautions: Back Recall of Precautions/Restrictions: Impaired Precaution/Restrictions Comments: Reviewed precautions verbally before session and during functional mobility. Required Braces or Orthoses: Spinal Brace Spinal Brace: Lumbar corset;Applied in sitting position       Mobility Bed Mobility Overal bed mobility: Needs Assistance Bed Mobility: Rolling, Sidelying to Sit Rolling: Min assist Sidelying to sit: Min assist, Mod assist       General bed mobility comments: Verbal cues for sequencing. physical assist with pad to initiate roll to R  side. able to bring bles off of bed with min a.  Mod A to get trunk to midline. able to scoot eob without physical assistance. had very high bed.  adj. height for simulating home env. will need practice for back to bed with bed this height    Transfers Overall transfer level: Needs assistance Equipment used: Rolling walker (2 wheels) Transfers: Sit to/from Stand, Bed to chair/wheelchair/BSC Sit to Stand: Min assist     Step pivot transfers: Mod assist     General transfer comment: verbal cues for safe hand placement. No knee blocking required today. Mod to heavy assist on RW. noted LLE pt. had knee flexed and using on "tipy toe" vs full weight bearing during ambulation     Balance                                           ADL either performed or assessed with clinical judgement   ADL Overall ADL's : Needs assistance/impaired     Grooming: Wash/dry face;Set up;Sitting       Lower Body Bathing: With adaptive equipment;Sitting/lateral leans Lower Body Bathing Details (indicate cue type and reason): reviewed use of LH sponge for LB bathing Upper Body Dressing : Set up;Sitting;Cueing for sequencing Upper Body Dressing Details (indicate cue type and reason): able to don brace with verbal cues for sequencing and maintaing back precautions but no physical assistance needed Lower Body Dressing: Minimal assistance;Cueing for sequencing;With adaptive equipment;Cueing for compensatory techniques;Cueing for  back precautions Lower Body Dressing Details (indicate cue type and reason): pt. able to use sock aide and reacher for LB dressing tasks, verbally reviewed use of shoe horn Toilet Transfer: Moderate assistance;Ambulation;Rolling walker (2 wheels);Cueing for sequencing;Cueing for safety Toilet Transfer Details (indicate cue type and reason): in room ambulation around bed to recliner-declned b.room need/use. cues for use of BUEs pushing into RW to offset BLE weakness and  prevent buckling         Functional mobility during ADLs: Moderate assistance;Rolling walker (2 wheels) General ADL Comments: pt. did well with A/E and liked using it. son present for session and also verbalized benefits of it    Extremity/Trunk Assessment              Vision       Perception     Praxis     Communication Communication Communication: No apparent difficulties   Cognition Arousal: Alert Behavior During Therapy: WFL for tasks assessed/performed Cognition: No apparent impairments                               Following commands: Intact        Cueing   Cueing Techniques: Verbal cues  Exercises      Shoulder Instructions       General Comments      Pertinent Vitals/ Pain       Pain Assessment Pain Assessment: 0-10 Pain Score: 8  Pain Location: Incisional Pain Descriptors / Indicators: Sore, Aching Pain Intervention(s): Limited activity within patient's tolerance, Monitored during session, Repositioned, Patient requesting pain meds-RN notified  Home Living                                          Prior Functioning/Environment              Frequency  Min 2X/week        Progress Toward Goals  OT Goals(current goals can now be found in the care plan section)  Progress towards OT goals: Progressing toward goals     Plan      Co-evaluation                 AM-PAC OT "6 Clicks" Daily Activity     Outcome Measure   Help from another person eating meals?: None Help from another person taking care of personal grooming?: None Help from another person toileting, which includes using toliet, bedpan, or urinal?: A Lot Help from another person bathing (including washing, rinsing, drying)?: A Lot Help from another person to put on and taking off regular upper body clothing?: None Help from another person to put on and taking off regular lower body clothing?: A Lot 6 Click Score: 18    End of  Session Equipment Utilized During Treatment: Rolling walker (2 wheels)  OT Visit Diagnosis: Unsteadiness on feet (R26.81);Muscle weakness (generalized) (M62.81)   Activity Tolerance Patient tolerated treatment well   Patient Left in chair;with call bell/phone within reach   Nurse Communication Other (comment);Mobility status (rn states ok to work with pt, reviewed pt. up in chair will be stand pivot back to bed with rw.  pt. requests pain meds)        Time: 0623-7628 OT Time Calculation (min): 23 min  Charges: OT General Charges $OT Visit: 1 Visit OT Treatments $Self Care/Home Management :  23-37 mins  Boneta Lucks, COTA/L Acute Rehabilitation [phone]   Alessandra Bevels Lorraine-COTA/L 02/01/2024, 10:17 AM

## 2024-02-01 NOTE — Discharge Summary (Signed)
 Physician Discharge Summary     Providing Compassionate, Quality Care - Together   Patient ID: Stacy Frost MRN: [id number] DOB/AGE: [birth year] 63 y.o.  Admit date: 01/28/2024 Discharge date: 02/01/2024  Admission Diagnoses: Spondylolisthesis of lumbar region  Discharge Diagnoses:  Principal Problem:   Spondylolisthesis of lumbar region   Discharged Condition: good  Hospital Course: Patient underwent a three level TLIF by Dr. Lovell Sheehan on 01/28/2024. She was admitted to the hospital following recovery from anesthesia in the PACU. Her postoperative course has been complicated by fever of unknown origin and acute blood loss anemia. She has worked with both physical and occupational therapies who feel the patient is ready for discharge to CIR. She is ambulating iwith assistance. She is tolerating a normal diet. She is not having any bowel or bladder dysfunction. Her pain is reasonably controlled with oral pain medication. She is ready for discharge to CIR.   Consults: rehabilitation medicine  Significant Diagnostic Studies: radiology: DG CHEST PORT 1 VIEW Result Date: 01/31/2024 CLINICAL DATA:  Hypoxia EXAM: PORTABLE CHEST 1 VIEW COMPARISON:  09/26/2023 FINDINGS: Single frontal view of the chest demonstrates stable postsurgical changes from median sternotomy and aortic valve replacement. Cardiac silhouette is unremarkable. No acute airspace disease, effusion, or pneumothorax. No acute bony abnormalities. IMPRESSION: 1. No acute intrathoracic process. Electronically Signed   By: Sharlet Salina M.D.   On: 01/31/2024 16:19   DG Lumbar Spine 2-3 Views Result Date: 01/28/2024 CLINICAL DATA:  Lumbar fusion EXAM: LUMBAR SPINE - 2-3 VIEW COMPARISON:  01/28/2024 FINDINGS: Two fluoroscopic images are obtained during the performance of the procedure and are provided for interpretation only. Images demonstrate discectomies at L2-3, L3-4, and L4-5, with transpedicular screws at the L2, L3, L4, and L5 vertebral  bodies. Alignment is grossly anatomic. Please refer to operative report. Fluoroscopy time: 24.0 seconds, 20.72 mGy IMPRESSION: 1. Intraoperative evaluation as above. Please refer to the operative report. Electronically Signed   By: Sharlet Salina M.D.   On: 01/28/2024 20:43   DG Lumbar Spine 1 View Result Date: 01/28/2024 CLINICAL DATA:  Posterior lumbar interbody fusion, intraoperative assessment and level assessment EXAM: LUMBAR SPINE - 1 VIEW COMPARISON:  MRI lumbar spine 11/23/2023 FINDINGS: As on the lumbar MRI, the lowest fully segmental lumbar type non-rib-bearing vertebra is numbered L5. A lateral view of the lumbar spine with tissue spreader and sponge in place demonstrates a blunt tip probe oriented generally towards the L3-4 intervertebral space; the tip of the probe projects directly over the lower L3 spinous process. IMPRESSION: 1. L3-4 localization with blunt tip probe. Electronically Signed   By: Gaylyn Rong M.D.   On: 01/28/2024 20:02   DG C-Arm 1-60 Min-No Report Result Date: 01/28/2024 Fluoroscopy was utilized by the requesting physician.  No radiographic interpretation.   DG C-Arm 1-60 Min-No Report Result Date: 01/28/2024 Fluoroscopy was utilized by the requesting physician.  No radiographic interpretation.   MR LUMBAR SPINE WO CONTRAST Result Date: 12/03/2023 CLINICAL DATA:  Bilateral leg and weakness EXAM: MRI LUMBAR SPINE WITHOUT CONTRAST TECHNIQUE: Multiplanar, multisequence MR imaging of the lumbar spine was performed. No intravenous contrast was administered. COMPARISON:  CT AP 04/28/22 FINDINGS: Segmentation:  Standard. Alignment:  Grade 1 anterolisthesis L2-L3, L3-L4, and L4-L5 Vertebrae:  No fracture, evidence of discitis, or bone lesion. Conus medullaris and cauda equina: Conus extends to the L1 level. Conus and cauda equina appear normal. Paraspinal and other soft tissues: The common bile duct has increased in size compared to 2023, now measuring up to 11  mm. Disc  levels: T12-L1: Mild bilateral facet degenerative change. No significant disc bulge. No spinal canal narrowing. No neural foraminal L1-L2: Mild bilateral facet degenerative change. No significant disc bulge. No spinal canal narrowing. No neural foraminal narrowing L2-L3: Severe bilateral facet degenerative change. Ligamentum flavum hypertrophy. Circumferential disc bulge. Short pedicles. Severe spinal canal stenosis. Mild bilateral neural foraminal narrowing L3-L4: Severe bilateral facet degenerative change. Ligamentum flavum hypertrophy. Circumferential disc bulge. Short pedicles. Severe spinal canal stenosis. Mild-to-moderate bilateral neural foraminal narrowing. L4-L5: Moderate to severe bilateral facet degenerative change. Circumferential disc bulge. Moderate spinal canal stenosis. Mild-to-moderate bilateral neural foraminal L5-S1: Moderate left and mild right facet degenerative change. Eccentric right disc bulge. Mild overall spinal canal narrowing. There is narrowing of the right lateral recess. Extraforaminal component of the eccentric right disc bulge may contact the exited right L5 nerve root. Moderate right and mild left neural foraminal narrowing. IMPRESSION: 1. Severe spinal canal stenosis at L2-L3 and L3-L4 secondary to a combination of short pedicles, ligamentum flavum hypertrophy, and degenerative disc disease. Moderate spinal canal stenosis is also present at L4-L5. 2. Extraforaminal component of an eccentric right disc bulge at L5-S1 may contact the exited right L5 nerve root. 3. The common bile duct has increased in size compared to 2023, now measuring up to 11 mm. While this may be normal in the setting of a cholecystectomy, correlation with LFTs and symptoms of right upper quadrant pain is recommended. Electronically Signed   By: Lorenza Cambridge M.D.   On: 12/03/2023 06:40     Treatments: surgery: Bilateral L2-3, L3-4 and L4-5 laminotomy/foraminotomies/medial facetectomy to decompress the  bilateral L2, L3, L4 and L5 nerve roots(the work required to do this was in addition to the work required to do the posterior lumbar interbody fusion because of the patient's spinal stenosis, facet arthropathy. Etc. requiring a wide decompression of the nerve roots.); left L2-3, L3-4 and L4-5 transforaminal lumbar interbody fusion with local morselized autograft bone and Zimmer DBM; insertion of interbody prosthesis at L2-3, L3-4 and L4-5 (globus peek expandable interbody prosthesis); posterior segmental instrumentation from L2 to L5 with globus titanium pedicle screws and rods; posterior lateral arthrodesis at L2-3, L3-4 and L4-5 with local morselized autograft bone and Zimmer DBM.  Discharge Exam: Blood pressure (!) 118/56, pulse 90, temperature 100.2 F (37.9 C), temperature source Oral, resp. rate 17, height 5\' 6"  (1.676 m), weight 87.1 kg, SpO2 94%.  Alert and oriented x 4 PERRLA CN II-XII grossly intact MAE, Sensation intact, Strength left deltoid 4/5, left hip extension 4-/5 Incision is covered with Honeycomb dressing and Steri Strips; Dressing is clean, dry, and intact  Disposition: Discharge disposition: [facility]-Another [facility] Care Institution Not Defined       Discharge Instructions     Call MD for:  difficulty breathing, headache or visual disturbances   Complete by: As directed    Call MD for:  hives   Complete by: As directed    Call MD for:  persistant dizziness or light-headedness   Complete by: As directed    Call MD for:  persistant nausea and vomiting   Complete by: As directed    Call MD for:  redness, tenderness, or signs of infection (pain, swelling, redness, odor or green/yellow discharge around incision site)   Complete by: As directed    Call MD for:  severe uncontrolled pain   Complete by: As directed    Diet - low sodium heart healthy   Complete by: As directed    Increase  activity slowly   Complete by: As directed    No dressing needed   Complete by: As  directed    No wound care   Complete by: As directed       Allergies as of 02/01/2024       Reactions   Amlodipine Besylate Shortness Of Breath   Morphine And Codeine Shortness Of Breath, Other (See Comments)   Chest pain   Amitriptyline Other (See Comments)   Unknown reaction   Butorphanol Tartrate Other (See Comments)   REACTION: ED visit and got for migraine - not sure of response   Compazine [prochlorperazine Edisylate] Other (See Comments)   Altered mental status   Rocephin [ceftriaxone Sodium In Dextrose]    Given at GYN for infection - nauseated, dizziness (w/in last 7-8 years)   Sumatriptan Other (See Comments)   REACTION: HTN        Medication List     STOP taking these medications    celecoxib 200 MG capsule Commonly known as: CELEBREX       TAKE these medications    acetaminophen-codeine 300-30 MG tablet Commonly known as: TYLENOL #3 Take 1 tablet by mouth 2 (two) times daily as needed for moderate pain (pain score 4-6) (migraines).   aspirin EC 325 MG tablet Take 1 tablet (325 mg total) by mouth daily.   atorvastatin 80 MG tablet Commonly known as: LIPITOR TAKE 1 TABLET BY MOUTH ONCE DAILY   citalopram 40 MG tablet Commonly known as: CELEXA Take 40 mg by mouth daily.   clonazePAM 0.5 MG tablet Commonly known as: KLONOPIN Take 0.5 mg by mouth 2 (two) times daily as needed for anxiety.   cyclobenzaprine 10 MG tablet Commonly known as: FLEXERIL Take 10 mg by mouth 3 (three) times daily as needed for muscle spasms.   Dexcom G7 Receiver Devi USE 1 receiver continuously   Dexcom G7 Sensor Misc APPLY 1 SENSOR EVERY 10 DAYS   docusate sodium 100 MG capsule Commonly known as: COLACE Take 1 capsule (100 mg total) by mouth 2 (two) times daily.   ferrous sulfate 325 (65 FE) MG tablet Take 1 tablet (325 mg total) by mouth daily with breakfast.   furosemide 40 MG tablet Commonly known as: Lasix Take 0.5 tablets (20 mg total) by mouth daily as  needed (for swelling and weight gain). What changed:  how much to take when to take this   gabapentin 100 MG capsule Commonly known as: NEURONTIN Take 100 mg by mouth 3 (three) times daily. Take 100mg  (1 capsule) by mouth every morning and 200mg  (2 capsules) in the evening.   insulin glargine-yfgn 100 UNIT/ML Pen Commonly known as: Semglee (yfgn) Inject 30 Units into the skin at bedtime. What changed: how much to take   metFORMIN 500 MG 24 hr tablet Commonly known as: GLUCOPHAGE-XR TAKE 2 TABLETS BY MOUTH DAILY WITH BREAKFAST   metoprolol succinate 25 MG 24 hr tablet Commonly known as: TOPROL-XL Take 1 tablet (25 mg total) by mouth 2 (two) times daily at 10 AM and 5 PM. Take with or immediately following a meal.   Mounjaro 7.5 MG/0.5ML Pen Generic drug: tirzepatide INJECT 7.5 MG UNDER THE SKIN WEEKLY   oxyCODONE-acetaminophen 5-325 MG tablet Commonly known as: Percocet Take 1-2 tablets by mouth every 4 (four) hours as needed.   potassium chloride SA 20 MEQ tablet Commonly known as: KLOR-CON M TAKE 1 TABLET BY MOUTH DAILY   pramipexole 0.5 MG tablet Commonly known as: MIRAPEX Take 1  mg by mouth at bedtime.   promethazine 25 MG tablet Commonly known as: PHENERGAN Take 25 mg by mouth 2 (two) times daily as needed.   traZODone 150 MG tablet Commonly known as: DESYREL Take 150 mg by mouth at bedtime.               Discharge Care Instructions  (From admission, onward)           Start     Ordered   02/01/24 0000  No dressing needed        02/01/24 1610            Follow-up Information     Tressie Stalker, MD Follow up.   Specialty: Neurosurgery Contact information: [address] [place] [address] [phone]                 Signed: Val Eagle, DNP, AGNP-C Nurse Practitioner  [facility] [address], [place], [place] [address] P: [phone]    F:  [phone]  02/01/2024, 9:34 AM

## 2024-02-01 NOTE — Progress Notes (Signed)
 Inpatient Rehab Admissions Coordinator:  There is a bed available for pt in CIR today. Pt, pt's son Josh, NSG, and TOC made aware.   Wolfgang Phoenix, MS, CCC-SLP Admissions Coordinator [phone]

## 2024-02-01 NOTE — Plan of Care (Signed)
  Problem: Education: Goal: Knowledge of General Education information will improve Description: Including pain rating scale, medication(s)/side effects and non-pharmacologic comfort measures Outcome: Adequate for Discharge   Problem: Health Behavior/Discharge Planning: Goal: Ability to manage health-related needs will improve Outcome: Adequate for Discharge   Problem: Clinical Measurements: Goal: Ability to maintain clinical measurements within normal limits will improve Outcome: Adequate for Discharge Goal: Will remain free from infection Outcome: Adequate for Discharge Goal: Diagnostic test results will improve Outcome: Adequate for Discharge Goal: Respiratory complications will improve Outcome: Adequate for Discharge Goal: Cardiovascular complication will be avoided Outcome: Adequate for Discharge   Problem: Activity: Goal: Risk for activity intolerance will decrease Outcome: Adequate for Discharge   Problem: Nutrition: Goal: Adequate nutrition will be maintained Outcome: Adequate for Discharge   Problem: Coping: Goal: Level of anxiety will decrease Outcome: Adequate for Discharge   Problem: Elimination: Goal: Will not experience complications related to bowel motility Outcome: Adequate for Discharge Goal: Will not experience complications related to urinary retention Outcome: Adequate for Discharge   Problem: Pain Managment: Goal: General experience of comfort will improve and/or be controlled Outcome: Adequate for Discharge   Problem: Safety: Goal: Ability to remain free from injury will improve Outcome: Adequate for Discharge   Problem: Skin Integrity: Goal: Risk for impaired skin integrity will decrease Outcome: Adequate for Discharge   Problem: Education: Goal: Ability to describe self-care measures that may prevent or decrease complications (Diabetes Survival Skills Education) will improve Outcome: Adequate for Discharge Goal: Individualized Educational  Video(s) Outcome: Adequate for Discharge   Problem: Coping: Goal: Ability to adjust to condition or change in health will improve Outcome: Adequate for Discharge   Problem: Fluid Volume: Goal: Ability to maintain a balanced intake and output will improve Outcome: Adequate for Discharge   Problem: Health Behavior/Discharge Planning: Goal: Ability to identify and utilize available resources and services will improve Outcome: Adequate for Discharge Goal: Ability to manage health-related needs will improve Outcome: Adequate for Discharge   Problem: Metabolic: Goal: Ability to maintain appropriate glucose levels will improve Outcome: Adequate for Discharge   Problem: Nutritional: Goal: Maintenance of adequate nutrition will improve Outcome: Adequate for Discharge Goal: Progress toward achieving an optimal weight will improve Outcome: Adequate for Discharge   Problem: Skin Integrity: Goal: Risk for impaired skin integrity will decrease Outcome: Adequate for Discharge   Problem: Tissue Perfusion: Goal: Adequacy of tissue perfusion will improve Outcome: Adequate for Discharge   Problem: Education: Goal: Ability to verbalize activity precautions or restrictions will improve Outcome: Adequate for Discharge Goal: Knowledge of the prescribed therapeutic regimen will improve Outcome: Adequate for Discharge Goal: Understanding of discharge needs will improve Outcome: Adequate for Discharge   Problem: Activity: Goal: Ability to avoid complications of mobility impairment will improve Outcome: Adequate for Discharge Goal: Ability to tolerate increased activity will improve Outcome: Adequate for Discharge Goal: Will remain free from falls Outcome: Adequate for Discharge   Problem: Bowel/Gastric: Goal: Gastrointestinal status for postoperative course will improve Outcome: Adequate for Discharge   Problem: Clinical Measurements: Goal: Ability to maintain clinical measurements  within normal limits will improve Outcome: Adequate for Discharge Goal: Postoperative complications will be avoided or minimized Outcome: Adequate for Discharge Goal: Diagnostic test results will improve Outcome: Adequate for Discharge   Problem: Pain Management: Goal: Pain level will decrease Outcome: Adequate for Discharge   Problem: Skin Integrity: Goal: Will show signs of wound healing Outcome: Adequate for Discharge

## 2024-02-01 NOTE — PMR Pre-admission (Deleted)
 Signed     Expand All Collapse All PMR Admission Coordinator Pre-Admission Assessment   Patient: Stacy Frost is an 63 y.o., female MRN: [id number] DOB: March 31, 1961 Height: 5\' 6"  (167.6 cm) Weight: 87.1 kg                                                                                                                                                  Insurance Information HMO: yes    PPO:      PCP:      IPA:      80/20:      OTHER:  PRIMARY:  Generic [employer]      Policy#: [id number]      Subscriber: patient CM Name: Stacy Frost      Phone#: [phone]     Fax#: [phone] Pre-Cert#: [id number] Received approval on 01/30/24. Pt approved from 01/30/24 to 02/06/24. Pt approved for 7 days.      Employer: Self Benefits:  Phone #: [phone]     Name: Verified Stacy Frost Date: 04/29/2022- still active Deductible: $4,500 ($4,500 met) OOP Max: $8,550 ($8,550 met) CIR: 70% coverage; 30% coverage SNF: 70% coverage; 30% coverage; limited to 60 days/cal yr Outpatient:  50% coverage; 50% coverage Home Health:  50% coverage; 50% coverage DME: 50% coverage; 50% coverage; limited by medical necessity Providers: in network SECONDARY:       Policy#:       Phone#:    Artist:       Phone#:    The Engineer, materials Information Summary" for patients in Inpatient Rehabilitation Facilities with attached "Privacy Act Statement-Health Care Records" was provided and verbally reviewed with: Patient   Emergency Contact Information Contact Information       Name Relation Home Work [place] Spouse [phone]   [phone]         Other Contacts       Name Relation Home Work Mobile    Roskos,Josh Son [phone]   [phone]         Current Medical History  Patient Admitting Diagnosis: Lumbar Spondylosis  History of Present Illness: Stacy Frost is a 63 y.o. female with a history of DM, migraines, AS/MVP who developed increasing low back and bilateral buttock/leg pain consistent  with neurogenic claudication. MRI revealed multi-level spondylolisthesis and stenosis compressing the L2, L3, L4, and L5 nerve roots. On 01/28/24 the patient elected to undergo bilateral L2-3, L3-4 and L4-5 laminotomies/foraminotomies/medial facetectomies to decompress the L2-L5 nerve root as well as a PLIF L2-L5 by Dr. Lovell Sheehan. Procedure was performed at [facility] and pt. Remained on neurosurgery service post op, as she demonstrated significant functional deficits.  Pt has a lumbar corset which is to be donned in the sitting position. Pt lives in a one level home with her spouse with 1&1 steps to enter. Pt. Was seen by  PT/OT post operatively and they recommend CIR to assist return to PLOF.    Patient's medical record from [facility] has been reviewed by the rehabilitation admission coordinator and physician.   Past Medical History      Past Medical History:  Diagnosis Date   Anxiety     Aortic stenosis      Mild to moderate   Bicuspid aortic valve     Essential hypertension, benign     GERD (gastroesophageal reflux disease)     Heart murmur      S/P Aortic Valve Replacement July 2024   History of cardiac catheterization      Normal coronaries 2008   Hyperlipidemia     Migraine headache     MVP (mitral valve prolapse)     Obesity     Pain in joint, ankle and foot      Chronic   Palpitations      Documented PACs by previous monitoring   Restless leg syndrome     Skin tag     Type 2 diabetes mellitus (HCC)     Unilateral primary osteoarthritis, right knee 09/06/2016          Has the patient had major surgery during 100 days prior to admission? Yes   Family History  family history includes Anxiety disorder in her daughter; Brain cancer in her paternal grandmother; Cancer in her mother; Depression in her daughter, father, and mother; Diabetes in her mother; Emphysema in her maternal grandfather; Heart attack in her father and paternal grandfather;  Hypertension in her father; Stroke in her daughter; Transient ischemic attack in her brother.     Current Medications   Current Medications    Current Facility-Administered Medications:    0.9 %  sodium chloride infusion, 250 mL, Intravenous, Continuous, Tressie Stalker, MD, Last Rate: 1 mL/hr at 01/28/24 2102, Restarted at 01/28/24 2102   acetaminophen (TYLENOL) tablet 650 mg, 650 mg, Oral, Q4H PRN **OR** acetaminophen (TYLENOL) suppository 650 mg, 650 mg, Rectal, Q4H PRN, Tressie Stalker, MD   acetaminophen (TYLENOL) tablet 1,000 mg, 1,000 mg, Oral, Q6H, Tressie Stalker, MD, 1,000 mg at 01/29/24 1223   atorvastatin (LIPITOR) tablet 80 mg, 80 mg, Oral, Daily, Tressie Stalker, MD, 80 mg at 01/29/24 9604   bisacodyl (DULCOLAX) suppository 10 mg, 10 mg, Rectal, Daily PRN, Tressie Stalker, MD   citalopram (CELEXA) tablet 40 mg, 40 mg, Oral, Daily, Tressie Stalker, MD, 40 mg at 01/29/24 0914   clonazePAM (KLONOPIN) tablet 0.5 mg, 0.5 mg, Oral, BID PRN, Tressie Stalker, MD   cyclobenzaprine (FLEXERIL) tablet 10 mg, 10 mg, Oral, TID PRN, Tressie Stalker, MD, 10 mg at 01/28/24 2324   docusate sodium (COLACE) capsule 100 mg, 100 mg, Oral, BID, Tressie Stalker, MD, 100 mg at 01/29/24 5409   ferrous sulfate tablet 325 mg, 325 mg, Oral, Q breakfast, Tressie Stalker, MD, 325 mg at 01/29/24 8119   furosemide (LASIX) tablet 40 mg, 40 mg, Oral, Daily, Tressie Stalker, MD, 40 mg at 01/29/24 0914   gabapentin (NEURONTIN) capsule 100 mg, 100 mg, Oral, Daily, Tressie Stalker, MD, 100 mg at 01/29/24 1000   gabapentin (NEURONTIN) capsule 200 mg, 200 mg, Oral, QHS, Tressie Stalker, MD, 200 mg at 01/28/24 2123   HYDROmorphone (DILAUDID) injection 0.5-1 mg, 0.5-1 mg, Intravenous, Q2H PRN, Tressie Stalker, MD   insulin aspart (novoLOG) injection 0-15 Units, 0-15 Units, Subcutaneous, TID WC, Tressie Stalker, MD, 5 Units at 01/29/24 1224   insulin aspart (novoLOG) injection 0-5 Units, 0-5  Units,  Subcutaneous, QHS, Tressie Stalker, MD   menthol-cetylpyridinium (CEPACOL) lozenge 3 mg, 1 lozenge, Oral, PRN **OR** phenol (CHLORASEPTIC) mouth spray 1 spray, 1 spray, Mouth/Throat, PRN, Tressie Stalker, MD   metFORMIN (GLUCOPHAGE-XR) 24 hr tablet 1,000 mg, 1,000 mg, Oral, Q breakfast, Tressie Stalker, MD, 1,000 mg at 01/29/24 0915   metoprolol succinate (TOPROL-XL) 24 hr tablet 25 mg, 25 mg, Oral, BID, Tressie Stalker, MD, 25 mg at 01/29/24 0914   ondansetron (ZOFRAN) tablet 4 mg, 4 mg, Oral, Q6H PRN **OR** ondansetron (ZOFRAN) injection 4 mg, 4 mg, Intravenous, Q6H PRN, Tressie Stalker, MD, 4 mg at 01/28/24 2107   oxyCODONE (Oxy IR/ROXICODONE) immediate release tablet 10 mg, 10 mg, Oral, Q3H PRN, Tressie Stalker, MD, 10 mg at 01/29/24 1223   oxyCODONE (Oxy IR/ROXICODONE) immediate release tablet 5 mg, 5 mg, Oral, Q3H PRN, Tressie Stalker, MD   potassium chloride SA (KLOR-CON M) CR tablet 20 mEq, 20 mEq, Oral, Daily, Tressie Stalker, MD, 20 mEq at 01/29/24 0915   pramipexole (MIRAPEX) tablet 1 mg, 1 mg, Oral, QHS, Tressie Stalker, MD, 1 mg at 01/28/24 2123   sodium chloride flush (NS) 0.9 % injection 3 mL, 3 mL, Intravenous, Q12H, Tressie Stalker, MD, 3 mL at 01/29/24 0918   sodium chloride flush (NS) 0.9 % injection 3 mL, 3 mL, Intravenous, PRN, Tressie Stalker, MD   zolpidem Remus Loffler) tablet 5 mg, 5 mg, Oral, QHS PRN, Tressie Stalker, MD     Patients Current Diet:  Diet Order                  Diet Carb Modified Fluid consistency: Thin; Room service appropriate? Yes  Diet effective now                         Precautions / Restrictions Precautions Precautions: Back Precaution Booklet Issued: Yes (comment) Precaution/Restrictions Comments: Reviewed precautions verbally during functional mobility. Spinal Brace: Lumbar corset, Applied in sitting position Restrictions Weight Bearing Restrictions Per Provider Order: No    Has the patient had 2 or more falls or a fall with  injury in the past year?Yes   Prior Activity Level Community (5-7x/wk): Pt. active in the community   Prior Functional Level Prior Function Prior Level of Function : Independent/Modified Independent   Self Care: Did the patient need help bathing, dressing, using the toilet or eating?  Independent   Indoor Mobility: Did the patient need assistance with walking from room to room (with or without device)? Independent   Stairs: Did the patient need assistance with internal or external stairs (with or without device)? Independent   Functional Cognition: Did the patient need help planning regular tasks such as shopping or remembering to take medications? Independent   Patient Information Are you of Hispanic, Latino/a,or Spanish origin?: A. No, not of Hispanic, Latino/a, or Spanish origin What is your race?: A. White Do you need or want an interpreter to communicate with a doctor or health care staff?: 0. No   Patient's Response To:  Health Literacy and Transportation Is the patient able to respond to health literacy and transportation needs?: Yes Health Literacy - How often do you need to have someone help you when you read instructions, pamphlets, or other written material from your doctor or pharmacy?: Never In the past 12 months, has lack of transportation kept you from medical appointments or from getting medications?: No In the past 12 months, has lack of transportation kept you from meetings, work, or from  getting things needed for daily living?: No   Home Assistive Devices / Equipment Home Equipment: Shower seat, BSC/3in1, Rollator (4 wheels)   Prior Device Use: Indicate devices/aids used by the patient prior to current illness, exacerbation or injury? Walker   Current Functional Level Cognition   Orientation Level: Oriented X4    Extremity Assessment (includes Sensation/Coordination)   Upper Extremity Assessment: Defer to OT evaluation  Lower Extremity Assessment: RLE  deficits/detail, LLE deficits/detail RLE Deficits / Details: Decreased strength and sensation in lower leg post-op. Pt reports no sensation on top of foot to light touch, and decreased sensation up to knee. 4-/5 strength grossly. LLE Deficits / Details: Significantly decreased sensation at knee, and no sensation from below knee down to foot. 2/5 strength in quads, ankle DF.     ADLs   Overall ADL's : Needs assistance/impaired Eating/Feeding: Independent, Sitting Grooming: Wash/dry hands, Wash/dry face, Set up, Sitting Upper Body Bathing: Set up, Sitting Lower Body Bathing: Moderate assistance, Sitting/lateral leans Upper Body Dressing : Set up, Sitting Lower Body Dressing: Moderate assistance, Sitting/lateral leans Toilet Transfer: Moderate assistance, Transfer board, BSC/3in1     Mobility   Overal bed mobility: Needs Assistance Bed Mobility: Sit to Sidelying, Rolling Rolling: Contact guard assist Sidelying to sit: Min assist Sit to sidelying: Mod assist General bed mobility comments: Assist for LE elevation back up to bed at end of session and for repositioning in bed.     Transfers   Overall transfer level: Needs assistance Equipment used: Rolling walker (2 wheels) Transfers: Sit to/from Stand Sit to Stand: Contact guard assist, Via lift equipment, From elevated surface Transfer via Lift Equipment: Stedy General transfer comment: Stedy utilized for sit>stand due to L knee buckling. In standing, pt with decreased ability to demonstrate quad activation and TKE.     Ambulation / Gait / Stairs / Wheelchair Mobility   Ambulation/Gait Pre-gait activities: In standing within the frame of the [place], pt attempted terminal knee extension and quad activation. Pt tolerated ~10 minutes total of standing (with seated rest breaks intermittently), and pt was able to engage quad 3 separate times. Able to shift weight to the R, but unable to demonstrate heel raise on the L.     Posture / Balance  Balance Overall balance assessment: Needs assistance Sitting-balance support: Feet supported Sitting balance-Leahy Scale: Good Standing balance support: Reliant on assistive device for balance Standing balance-Leahy Scale: Zero Standing balance comment: unable to maintain stand without support to L knee     Special needs/care consideration Skin surgical incision  and Special service needs corset brace        Previous Home Environment (from acute therapy documentation) Living Arrangements: Spouse/significant other, Children Available Help at Discharge: Family Type of Home: House Home Layout: One level Home Access: Stairs to enter Entrance Stairs-Rails: None Entrance Stairs-Number of Steps: 1&1 Bathroom Shower/Tub: Engineer, manufacturing systems: Standard Bathroom Accessibility: No   Discharge Living Setting Plans for Discharge Living Setting: House Type of Home at Discharge: House Discharge Home Layout: One level Discharge Home Access: Stairs to enter Entrance Stairs-Rails: None Entrance Stairs-Number of Steps: 2 Discharge Bathroom Shower/Tub: Tub/shower unit Discharge Bathroom Toilet: Standard Discharge Bathroom Accessibility: Yes How Accessible: Accessible via walker   Social/Family/Support Systems Patient Roles: Spouse Contact Information: [phone] Anticipated Caregiver: Carmelina Paddock Caregiver Availability: 24/7 Discharge Plan Discussed with Primary Caregiver: Yes Is Caregiver In Agreement with Plan?: Yes Does Caregiver/Family have Issues with Lodging/Transportation while Pt is in Rehab?: No     Goals Patient/Family  Goal for Rehab: PT/OT Mod I Expected length of stay: 9-14 days Pt/Family Agrees to Admission and willing to participate: Yes Program Orientation Provided & Reviewed with Pt/Caregiver Including Roles  & Responsibilities: Yes     Decrease burden of Care through IP rehab admission: not anticipated     Possible need for SNF placement upon  discharge:not anticipated   Patient Condition: This patient's medical and functional status has changed since the consult dated: 01/29/24 in which the Rehabilitation Physician determined and documented that the patient's condition is appropriate for intensive rehabilitative care in an inpatient rehabilitation facility. See "History of Present Illness" (above) for medical update. Functional changes are: Currently requiring min to mod assist. Patient's medical and functional status update has been discussed with the Rehabilitation physician and patient remains appropriate for inpatient rehabilitation. Will admit to inpatient rehab today.   Preadmission Screen Completed By:  Jeronimo Greaves, CCC-SLP, 01/29/2024 3:07 PM ______________________________________________________________________   Discussed status with Dr. Riley Kill on 02/01/24 at 9:32 AM and received approval for admission today.   Admission Coordinator:  Jeronimo Greaves, time 9:32 AM/Date 02/01/24

## 2024-02-01 NOTE — Progress Notes (Signed)
 02/01/24 1000  Spiritual Encounters  Type of Visit Follow up  Care provided to: Pt and family  Referral source Patient request  Reason for visit Advance directives  OnCall Visit No    Chaplain responded to consult request for HCPOA. Patient stated she feels better for today and she can receive the education of the document. Her son was at bedside.  Chaplain provided the Advance Directive packet as well as education on Advance Directives-documents an individual completes to communicate their health care directions in advance of a time when they may need them. Chaplain informed pt the documents which may be completed here in the hospital are the Living Will and Health Care Power of [place].  Chaplain informed that the Health Care Power of [place] is a legal document in which an individual names another person, their Health Care Agent, to make health care decisions when the individual is not able to make them for themselves. The Health Care Agent's function can be temporary or permanent depending on the pt's ability to make and communicate those decisions independently. Chaplain informed pt in the absence of a Health Care Power of [place], the state of [place] directs health care providers to look to the following individuals in the order listed: legal guardian; an attorney?in?fact under a general power of attorney (POA) if that POA includes the right to make health care decisions; a husband or wife; a majority of parents and adult children; a majority of adult brothers and sisters; or an individual who has an established relationship with you, who is acting in good faith and who can convey your wishes.  If none of these person are available or willing to make medical decisions on a patient's behalf, the law allows the patient's doctor to make decisions for them as long as another doctor agrees with those decisions.  Chaplain also informed the patient that the Health Care agent has no  decision-making authority over any affairs other than those related to his or her medical care.  The chaplain further educated the pt that a Living Will is a legal document that allows an individual to state his or her desire not to receive life-prolonging measures in the event that they have a condition that is incurable and will result in their death in a short period of time; they are unconscious, and doctors are confident that they will not regain consciousness; and/or they have advanced dementia or other substantial and irreversible loss of mental function. The chaplain informed pt that life-prolonging measures are medical treatments that would only serve to postpone death, including breathing machines, kidney dialysis, antibiotics, artificial nutrition and hydration (tube feeding), and similar forms of treatment and that if an individual is able to express their wishes, they may also make them known without the use of a Living Will, but in the event that an individual is not able to express their wishes themselves, a Living Will allows medical providers and the pt's family and friends ensure that they are not making decisions on the pt's behalf, but rather serving as the pt's voice to convey decisions the pt has already made.  The patient is aware that the decision to create an advance directive is theirs alone and they may chose not to complete the documents or may chose to complete one portion or both.  The patient was informed that they can revoke the documents at any time by striking through them and writing void or by completing new documents, but that it is  also advisable that the individual verbally notify interested parties that their wishes have changed.  They are also aware that the document must be signed in the presence of a notary public and two witnesses and that this can be done while the patient is still admitted to the hospital or after discharge in the community. If they decide to  complete Advance Directives after being discharged from the hospital, they have been advised to notify all interested parties and to provide those documents to their physicians and loved ones in addition to bringing them to the hospital in the event of another hospitalization.  The chaplain informed the pt that if they desire to proceed with completing Advance Directive Documentation while they are still admitted, notary services are typically available at [facility] between the hours of 1:00 and 3:30 Monday-Thursday.    When the patient is ready to have these documents completed, the patient should request that their nurse place a spiritual care consult and indicate that the patient is ready to have their advance directives notarized so that arrangements for witnesses and notary public can be made.  Please page spiritual care if the patient desires further education or has questions.    M.Kubra Delano Metz Resident [phone]

## 2024-02-01 NOTE — Progress Notes (Signed)
 Signed      Expand All Collapse All          Physical Medicine and Rehabilitation Consult Reason for Consult:impaired functional mobility Referring Physician: Lovell Sheehan     HPI: Stacy Frost is a 63 y.o. female with a history of DM, migraines, AS/MVP who developed increasing low back and bilateral buttock/leg pain consistent with neurogenic claudication. MRI revealed multi-level spondylolisthesis and stenosis compressing the L2, L3, L4, and L5 nerve roots. On 01/28/24 the patient elected to undergo bilateral L2-3, L3-4 and L4-5 laminotomies/foraminotomies/medial facetectomies to decompress the L2-L5 nerve root as well as a PLIF L2-L5 by Dr. Lovell Sheehan. Pt was up with therapies today and was noted to have difficulty with knee extension and control of lower extremities which impacted standing in the standing frame . She was CGA for sit-stand transfers using the [facility] but demonstrated significant knee buckling and poor quad activation. Pt has a lumbar corset which is to be donned in the sitting position. Pt lives in a one level home with her spouse with 1&1 steps to enter.     Review of Systems  Constitutional:  Positive for malaise/fatigue.  HENT: Negative.    Eyes: Negative.   Respiratory: Negative.    Cardiovascular: Negative.   Gastrointestinal: Negative.   Genitourinary:  Negative for frequency and urgency.  Skin: Negative.   Neurological:  Positive for sensory change, focal weakness and weakness.  Psychiatric/Behavioral: Negative.          Past Medical History:  Diagnosis Date   Anxiety     Aortic stenosis      Mild to moderate   Bicuspid aortic valve     Essential hypertension, benign     GERD (gastroesophageal reflux disease)     Heart murmur      S/P Aortic Valve Replacement July 2024   History of cardiac catheterization      Normal coronaries 2008   Hyperlipidemia     Migraine headache     MVP (mitral valve prolapse)     Obesity     Pain in joint, ankle and foot       Chronic   Palpitations      Documented PACs by previous monitoring   Restless leg syndrome     Skin tag     Type 2 diabetes mellitus (HCC)     Unilateral primary osteoarthritis, right knee 09/06/2016             Past Surgical History:  Procedure Laterality Date   ACHILLES TENDON REPAIR   2003    Left   AORTIC VALVE REPLACEMENT N/A 05/14/2023    Procedure: AORTIC VALVE REPLACEMENT (AVR);  Surgeon: Corliss Skains, MD;  Location: [facility] OR;  Service: Open Heart Surgery;  Laterality: N/A;   CARDIAC CATHETERIZATION   2008    Normal coronary arteries; normal LV systolic function; mild dilation of aortic root; mild dilation of proximal ascending aorta; likely bicuspid aortic valve   CHOLECYSTECTOMY   2001    "Poor EF at 17%"   COLONOSCOPY N/A 07/17/2014    Procedure: COLONOSCOPY;  Surgeon: Malissa Hippo, MD;  Location: [facility] ENDO SUITE;  Service: Endoscopy;  Laterality: N/A;  105   ESOPHAGEAL DILATION N/A 06/11/2015    Procedure: ESOPHAGEAL DILATION;  Surgeon: Malissa Hippo, MD;  Location: [facility] ORS;  Service: Endoscopy;  Laterality: N/AElease Hashimoto 54/56/58   ESOPHAGOGASTRODUODENOSCOPY N/A 07/17/2014    Procedure: ESOPHAGOGASTRODUODENOSCOPY (EGD);  Surgeon: Malissa Hippo, MD;  Location: [facility];  Service: Endoscopy;  Laterality: N/A;   ESOPHAGOGASTRODUODENOSCOPY (EGD) WITH PROPOFOL N/A 06/11/2015    Procedure: ESOPHAGOGASTRODUODENOSCOPY (EGD) WITH PROPOFOL;  Surgeon: Malissa Hippo, MD;  Location: [facility] ORS;  Service: Endoscopy;  Laterality: N/A;  730   KNEE ARTHROSCOPY WITH MEDIAL MENISECTOMY Right 07/02/2018    Procedure: RIGHT KNEE ARTHROSCOPY, DEBRIDEMENT, MEDIAL MENISECTOMY;  Surgeon: [name], MD;  Location: [facility] OR;  Service: Orthopedics;  Laterality: Right;   KNEE SURGERY       MALONEY DILATION N/A 07/17/2014    Procedure: MALONEY DILATION;  Surgeon: Malissa Hippo, MD;  Location: [facility];  Service: Endoscopy;  Laterality: N/A;   RIGHT/LEFT HEART CATH AND  CORONARY ANGIOGRAPHY N/A 05/02/2023    Procedure: RIGHT/LEFT HEART CATH AND CORONARY ANGIOGRAPHY;  Surgeon: Swaziland, Peter M, MD;  Location: [facility] INVASIVE CV LAB;  Service: Cardiovascular;  Laterality: N/A;   TEE WITHOUT CARDIOVERSION N/A 05/14/2023    Procedure: TRANSESOPHAGEAL ECHOCARDIOGRAM;  Surgeon: Corliss Skains, MD;  Location: [facility] OR;  Service: Open Heart Surgery;  Laterality: N/A;   UMBILICAL HERNIA REPAIR   2003             Family History  Problem Relation Age of Onset   Depression Mother     Cancer Mother          Lung mets (unsure as to what kind)   Diabetes Mother     Hypertension Father     Depression Father     Heart attack Father     Depression Daughter     Anxiety disorder Daughter     Stroke Daughter     Transient ischemic attack Brother     Emphysema Maternal Grandfather     Brain cancer Paternal Grandmother     Heart attack Paternal Grandfather          Social History:  reports that she has never smoked. She has never used smokeless tobacco. She reports that she does not drink alcohol and does not use drugs. Allergies:  Allergies       Allergies  Allergen Reactions   Amlodipine Besylate Shortness Of Breath   Morphine And Codeine Shortness Of Breath and Other (See Comments)      Chest pain   Amitriptyline Other (See Comments)      Unknown reaction   Butorphanol Tartrate Other (See Comments)      REACTION: ED visit and got for migraine - not sure of response   Compazine [Prochlorperazine Edisylate] Other (See Comments)      Altered mental status   Rocephin [Ceftriaxone Sodium In Dextrose]        Given at GYN for infection - nauseated, dizziness (w/in last 7-8 years)   Sumatriptan Other (See Comments)      REACTION: HTN            Medications Prior to Admission  Medication Sig Dispense Refill   acetaminophen-codeine (TYLENOL #3) 300-30 MG tablet Take 1 tablet by mouth 2 (two) times daily as needed for moderate pain (pain score 4-6) (migraines).        atorvastatin (LIPITOR) 80 MG tablet TAKE 1 TABLET BY MOUTH ONCE DAILY 90 tablet 3   celecoxib (CELEBREX) 200 MG capsule Take 200 mg by mouth 2 (two) times daily.       citalopram (CELEXA) 40 MG tablet Take 40 mg by mouth daily.       cyclobenzaprine (FLEXERIL) 10 MG tablet Take 10 mg by mouth 3 (three) times daily as needed for  muscle spasms.       ferrous sulfate 325 (65 FE) MG tablet Take 1 tablet (325 mg total) by mouth daily with breakfast. 60 tablet 1   furosemide (LASIX) 40 MG tablet Take 0.5 tablets (20 mg total) by mouth daily as needed (for swelling and weight gain). (Patient taking differently: Take 40 mg by mouth daily.) 30 tablet 5   gabapentin (NEURONTIN) 100 MG capsule Take 100 mg by mouth 3 (three) times daily. Take 100mg  (1 capsule) by mouth every morning and 200mg  (2 capsules) in the evening.       insulin glargine-yfgn (SEMGLEE, YFGN,) 100 UNIT/ML Pen Inject 30 Units into the skin at bedtime. (Patient taking differently: Inject 40 Units into the skin at bedtime.) 27 mL 3   metFORMIN (GLUCOPHAGE-XR) 500 MG 24 hr tablet TAKE 2 TABLETS BY MOUTH DAILY WITH BREAKFAST 180 tablet 1   metoprolol succinate (TOPROL-XL) 25 MG 24 hr tablet Take 1 tablet (25 mg total) by mouth 2 (two) times daily at 10 AM and 5 PM. Take with or immediately following a meal.       MOUNJARO 7.5 MG/0.5ML Pen INJECT 7.5 MG UNDER THE SKIN WEEKLY 6 mL 1   potassium chloride SA (KLOR-CON M) 20 MEQ tablet TAKE 1 TABLET BY MOUTH DAILY 30 tablet 0   pramipexole (MIRAPEX) 0.5 MG tablet Take 1 mg by mouth at bedtime.       promethazine (PHENERGAN) 25 MG tablet Take 25 mg by mouth 2 (two) times daily as needed.       traZODone (DESYREL) 150 MG tablet Take 150 mg by mouth at bedtime.       aspirin EC 325 MG tablet Take 1 tablet (325 mg total) by mouth daily.       clonazePAM (KLONOPIN) 0.5 MG tablet Take 0.5 mg by mouth 2 (two) times daily as needed for anxiety.       Continuous Blood Gluc Receiver (DEXCOM G7 RECEIVER)  DEVI USE 1 receiver continuously       Continuous Blood Gluc Sensor (DEXCOM G7 SENSOR) MISC APPLY 1 SENSOR EVERY 10 DAYS              Home: Home Living Family/patient expects to be discharged to:: Private residence Living Arrangements: Spouse/significant other, Children Available Help at Discharge: Family Type of Home: House Home Access: Stairs to enter Secretary/administrator of Steps: 1&1 Entrance Stairs-Rails: None Home Layout: One level Bathroom Shower/Tub: Engineer, manufacturing systems: Standard Bathroom Accessibility: No Home Equipment: Information systems manager, BSC/3in1, Rollator (4 wheels)  Functional History: Prior Function Prior Level of Function : Independent/Modified Independent Functional Status:  Mobility: Bed Mobility Overal bed mobility: Needs Assistance Bed Mobility: Sit to Sidelying, Rolling Rolling: Contact guard assist Sidelying to sit: Min assist Sit to sidelying: Mod assist General bed mobility comments: Assist for LE elevation back up to bed at end of session and for repositioning in bed. Transfers Overall transfer level: Needs assistance Equipment used: Rolling walker (2 wheels) Transfers: Sit to/from Stand Sit to Stand: Contact guard assist, Via lift equipment, From elevated surface Transfer via Lift Equipment: Stedy General transfer comment: Stedy utilized for sit>stand due to L knee buckling. In standing, pt with decreased ability to demonstrate quad activation and TKE. Ambulation/Gait Pre-gait activities: In standing within the frame of the Crocker, pt attempted terminal knee extension and quad activation. Pt tolerated ~10 minutes total of standing (with seated rest breaks intermittently), and pt was able to engage quad 3 separate times. Able to shift weight  to the R, but unable to demonstrate heel raise on the L.   ADL: ADL Overall ADL's : Needs assistance/impaired Eating/Feeding: Independent, Sitting Grooming: Wash/dry hands, Wash/dry face, Set up,  Sitting Upper Body Bathing: Set up, Sitting Lower Body Bathing: Moderate assistance, Sitting/lateral leans Upper Body Dressing : Set up, Sitting Lower Body Dressing: Moderate assistance, Sitting/lateral leans Toilet Transfer: Moderate assistance, Transfer board, BSC/3in1   Cognition: Cognition Orientation Level: Oriented X4 Cognition Arousal: Alert Behavior During Therapy: WFL for tasks assessed/performed   Blood pressure (!) 103/55, pulse 88, temperature 98.4 F (36.9 C), temperature source Oral, resp. rate 19, height 5\' 6"  (1.676 m), weight 87.1 kg, SpO2 95%. Physical Exam Constitutional:      General: She is not in acute distress.    Appearance: She is not ill-appearing.  HENT:     Head: Normocephalic.     Right Ear: External ear normal.     Left Ear: External ear normal.     Nose: Nose normal.     Mouth/Throat:     Pharynx: Oropharynx is clear.  Eyes:     Pupils: Pupils are equal, round, and reactive to light.  Cardiovascular:     Rate and Rhythm: Normal rate.  Pulmonary:     Effort: Pulmonary effort is normal.  Abdominal:     Palpations: Abdomen is soft.  Musculoskeletal:     Cervical back: Normal range of motion.     Right lower leg: No edema.     Left lower leg: No edema.     Comments: Low back tender with palpation and bed mobility.   Skin:    General: Skin is warm.     Comments: Surgical incision dressed  Neurological:     Mental Status: She is alert.     Comments: Alert and oriented x 3. Normal insight and awareness. Intact Memory. Normal language and speech. Cranial nerve exam unremarkable. MMT: BUE 5/5. RLE HF 2/5, KE 3- to 3/5, ADF/PF 4/5. LLE: HF 2/5 (pain component), KE 2- to 2/5, ADF/PF 1/5. Pt senses gross touch in both legs but seems to lose discrimination of light touch in both feet. DTR's tr to 1+.    Psychiatric:        Mood and Affect: Mood normal.        Behavior: Behavior normal.        Lab Results Last 24 Hours       Results for orders  placed or performed during the hospital encounter of 01/28/24 (from the past 24 hours)  Glucose, capillary     Status: Abnormal    Collection Time: 01/28/24  5:56 PM  Result Value Ref Range    Glucose-Capillary 156 (H) 70 - 99 mg/dL  Glucose, capillary     Status: Abnormal    Collection Time: 01/28/24 11:01 PM  Result Value Ref Range    Glucose-Capillary 177 (H) 70 - 99 mg/dL  Glucose, capillary     Status: Abnormal    Collection Time: 01/29/24  6:24 AM  Result Value Ref Range    Glucose-Capillary 184 (H) 70 - 99 mg/dL    Comment 1 Notify RN      Comment 2 Document in Chart    Glucose, capillary     Status: Abnormal    Collection Time: 01/29/24 12:22 PM  Result Value Ref Range    Glucose-Capillary 202 (H) 70 - 99 mg/dL       Imaging Results (Last 48 hours)  DG Lumbar Spine 2-3 Views Result  Date: 01/28/2024 CLINICAL DATA:  Lumbar fusion EXAM: LUMBAR SPINE - 2-3 VIEW COMPARISON:  01/28/2024 FINDINGS: Two fluoroscopic images are obtained during the performance of the procedure and are provided for interpretation only. Images demonstrate discectomies at L2-3, L3-4, and L4-5, with transpedicular screws at the L2, L3, L4, and L5 vertebral bodies. Alignment is grossly anatomic. Please refer to operative report. Fluoroscopy time: 24.0 seconds, 20.72 mGy IMPRESSION: 1. Intraoperative evaluation as above. Please refer to the operative report. Electronically Signed   By: Sharlet Salina M.D.   On: 01/28/2024 20:43    DG Lumbar Spine 1 View Result Date: 01/28/2024 CLINICAL DATA:  Posterior lumbar interbody fusion, intraoperative assessment and level assessment EXAM: LUMBAR SPINE - 1 VIEW COMPARISON:  MRI lumbar spine 11/23/2023 FINDINGS: As on the lumbar MRI, the lowest fully segmental lumbar type non-rib-bearing vertebra is numbered L5. A lateral view of the lumbar spine with tissue spreader and sponge in place demonstrates a blunt tip probe oriented generally towards the L3-4 intervertebral space;  the tip of the probe projects directly over the lower L3 spinous process. IMPRESSION: 1. L3-4 localization with blunt tip probe. Electronically Signed   By: Gaylyn Rong M.D.   On: 01/28/2024 20:02    DG C-Arm 1-60 Min-No Report Result Date: 01/28/2024 Fluoroscopy was utilized by the requesting physician.  No radiographic interpretation.    DG C-Arm 1-60 Min-No Report Result Date: 01/28/2024 Fluoroscopy was utilized by the requesting physician.  No radiographic interpretation.       Assessment/Plan: Diagnosis: 63 yo female with multilevel radiculopathy (L2-L5) d/t severe stenosis/spondylolisthesis s/p decompression and PLIF Does the need for close, 24 hr/day medical supervision in concert with the patient's rehab needs make it unreasonable for this patient to be served in a less intensive setting? Yes Co-Morbidities requiring supervision/potential complications:  -hx of recent CABG as well as AS/MVP -post-op pain -diabetes -hypertension -potential bowel and bladder considerations -?orthotic considerations for LLE Due to bladder management, bowel management, safety, skin/wound care, disease management, medication administration, pain management, and patient education, does the patient require 24 hr/day rehab nursing? Yes Does the patient require coordinated care of a physician, rehab nurse, therapy disciplines of PT, OT to address physical and functional deficits in the context of the above medical diagnosis(es)? Yes Addressing deficits in the following areas: balance, endurance, locomotion, strength, transferring, bowel/bladder control, bathing, dressing, feeding, grooming, toileting, and psychosocial support Can the patient actively participate in an intensive therapy program of at least 3 hrs of therapy per day at least 5 days per week? Yes The potential for patient to make measurable gains while on inpatient rehab is excellent Anticipated functional outcomes upon discharge from  inpatient rehab are modified independent  with PT, modified independent with OT, n/a with SLP. Estimated rehab length of stay to reach the above functional goals is: 9-14 days Anticipated discharge destination: Home Overall Rehab/Functional Prognosis: excellent   POST ACUTE RECOMMENDATIONS: This patient's condition is appropriate for continued rehabilitative care in the following setting: CIR Patient has agreed to participate in recommended program. Yes Note that insurance prior authorization may be required for reimbursement for recommended care.   Comment: It is only post-op day one but the patient has significant lower extremity weakness (L>R) with associated sensory loss. The patient was experiencing weakness and sensory symptoms in her legs leading up to this surgery. Pt's husband is at home and house is accessible. She is motivated to regain functional independence. Rehab Admissions Coordinator to follow up.  MEDICAL RECOMMENDATIONS: Sq lovenox for DVT prophylaxis. Might benefit from screening dopplers as well.      I have personally performed a face to face diagnostic evaluation of this patient. Additionally, I have examined the patient's medical record including any pertinent labs and radiographic images.     Thanks,   Ranelle Oyster, MD 01/29/2024

## 2024-02-01 NOTE — Plan of Care (Signed)
  Problem: Consults Goal: RH SPINAL CORD INJURY PATIENT EDUCATION Description:  See Patient Education module for education specifics.  Outcome: Progressing   Problem: SCI BOWEL ELIMINATION Goal: RH STG MANAGE BOWEL WITH ASSISTANCE Description: STG Manage Bowel with mod I Assistance. Outcome: Progressing   Problem: SCI BLADDER ELIMINATION Goal: RH STG MANAGE BLADDER WITH ASSISTANCE Description: STG Manage Bladder With Mod I  Assistance Outcome: Progressing   Problem: RH SKIN INTEGRITY Goal: RH STG SKIN FREE OF INFECTION/BREAKDOWN Description: Manage  skin free of infection/breakdown with mod I assistance Outcome: Progressing   Problem: RH SAFETY Goal: RH STG ADHERE TO SAFETY PRECAUTIONS W/ASSISTANCE/DEVICE Description: STG Adhere to Safety Precautions With  mod I Assistance/Device. Outcome: Progressing   Problem: RH PAIN MANAGEMENT Goal: RH STG PAIN MANAGED AT OR BELOW PT'S PAIN GOAL Description: <4 w/ prns Outcome: Progressing   Problem: RH KNOWLEDGE DEFICIT SCI Goal: RH STG INCREASE KNOWLEDGE OF SELF CARE AFTER SCI Description: Manage  increase knowledge f self care after SCI with mod I assistance using educational materials provided Outcome: Progressing

## 2024-02-01 NOTE — Progress Notes (Signed)
 Signed     Expand All Collapse All PMR Admission Coordinator Pre-Admission Assessment   Patient: Stacy Frost is an 63 y.o., female MRN: [id number] DOB: March 31, 1961 Height: 5\' 6"  (167.6 cm) Weight: 87.1 kg                                                                                                                                                  Insurance Information HMO: yes    PPO:      PCP:      IPA:      80/20:      OTHER:  PRIMARY:  Generic [employer]      Policy#: [id number]      Subscriber: patient CM Name: Michaelene Song      Phone#: [phone]     Fax#: [phone] Pre-Cert#: [id number] Received approval on 01/30/24. Pt approved from 01/30/24 to 02/06/24. Pt approved for 7 days.      Employer: Self Benefits:  Phone #: [phone]     Name: Verified Dolores Hoose Date: 04/29/2022- still active Deductible: $4,500 ($4,500 met) OOP Max: $8,550 ($8,550 met) CIR: 70% coverage; 30% coverage SNF: 70% coverage; 30% coverage; limited to 60 days/cal yr Outpatient:  50% coverage; 50% coverage Home Health:  50% coverage; 50% coverage DME: 50% coverage; 50% coverage; limited by medical necessity Providers: in network SECONDARY:       Policy#:       Phone#:    Artist:       Phone#:    The Engineer, materials Information Summary" for patients in Inpatient Rehabilitation Facilities with attached "Privacy Act Statement-Health Care Records" was provided and verbally reviewed with: Patient   Emergency Contact Information Contact Information       Name Relation Home Work [place] Spouse [phone]   [phone]         Other Contacts       Name Relation Home Work Mobile    Roskos,Josh Son [phone]   [phone]         Current Medical History  Patient Admitting Diagnosis: Lumbar Spondylosis  History of Present Illness: Stacy Frost is a 63 y.o. female with a history of DM, migraines, AS/MVP who developed increasing low back and bilateral buttock/leg pain consistent  with neurogenic claudication. MRI revealed multi-level spondylolisthesis and stenosis compressing the L2, L3, L4, and L5 nerve roots. On 01/28/24 the patient elected to undergo bilateral L2-3, L3-4 and L4-5 laminotomies/foraminotomies/medial facetectomies to decompress the L2-L5 nerve root as well as a PLIF L2-L5 by Dr. Lovell Sheehan. Procedure was performed at [facility] and pt. Remained on neurosurgery service post op, as she demonstrated significant functional deficits.  Pt has a lumbar corset which is to be donned in the sitting position. Pt lives in a one level home with her spouse with 1&1 steps to enter. Pt. Was seen by  PT/OT post operatively and they recommend CIR to assist return to PLOF.    Patient's medical record from [facility] has been reviewed by the rehabilitation admission coordinator and physician.   Past Medical History      Past Medical History:  Diagnosis Date   Anxiety     Aortic stenosis      Mild to moderate   Bicuspid aortic valve     Essential hypertension, benign     GERD (gastroesophageal reflux disease)     Heart murmur      S/P Aortic Valve Replacement July 2024   History of cardiac catheterization      Normal coronaries 2008   Hyperlipidemia     Migraine headache     MVP (mitral valve prolapse)     Obesity     Pain in joint, ankle and foot      Chronic   Palpitations      Documented PACs by previous monitoring   Restless leg syndrome     Skin tag     Type 2 diabetes mellitus (HCC)     Unilateral primary osteoarthritis, right knee 09/06/2016          Has the patient had major surgery during 100 days prior to admission? Yes   Family History  family history includes Anxiety disorder in her daughter; Brain cancer in her paternal grandmother; Cancer in her mother; Depression in her daughter, father, and mother; Diabetes in her mother; Emphysema in her maternal grandfather; Heart attack in her father and paternal grandfather;  Hypertension in her father; Stroke in her daughter; Transient ischemic attack in her brother.     Current Medications   Current Medications    Current Facility-Administered Medications:    0.9 %  sodium chloride infusion, 250 mL, Intravenous, Continuous, Tressie Stalker, MD, Last Rate: 1 mL/hr at 01/28/24 2102, Restarted at 01/28/24 2102   acetaminophen (TYLENOL) tablet 650 mg, 650 mg, Oral, Q4H PRN **OR** acetaminophen (TYLENOL) suppository 650 mg, 650 mg, Rectal, Q4H PRN, Tressie Stalker, MD   acetaminophen (TYLENOL) tablet 1,000 mg, 1,000 mg, Oral, Q6H, Tressie Stalker, MD, 1,000 mg at 01/29/24 1223   atorvastatin (LIPITOR) tablet 80 mg, 80 mg, Oral, Daily, Tressie Stalker, MD, 80 mg at 01/29/24 9604   bisacodyl (DULCOLAX) suppository 10 mg, 10 mg, Rectal, Daily PRN, Tressie Stalker, MD   citalopram (CELEXA) tablet 40 mg, 40 mg, Oral, Daily, Tressie Stalker, MD, 40 mg at 01/29/24 0914   clonazePAM (KLONOPIN) tablet 0.5 mg, 0.5 mg, Oral, BID PRN, Tressie Stalker, MD   cyclobenzaprine (FLEXERIL) tablet 10 mg, 10 mg, Oral, TID PRN, Tressie Stalker, MD, 10 mg at 01/28/24 2324   docusate sodium (COLACE) capsule 100 mg, 100 mg, Oral, BID, Tressie Stalker, MD, 100 mg at 01/29/24 5409   ferrous sulfate tablet 325 mg, 325 mg, Oral, Q breakfast, Tressie Stalker, MD, 325 mg at 01/29/24 8119   furosemide (LASIX) tablet 40 mg, 40 mg, Oral, Daily, Tressie Stalker, MD, 40 mg at 01/29/24 0914   gabapentin (NEURONTIN) capsule 100 mg, 100 mg, Oral, Daily, Tressie Stalker, MD, 100 mg at 01/29/24 1000   gabapentin (NEURONTIN) capsule 200 mg, 200 mg, Oral, QHS, Tressie Stalker, MD, 200 mg at 01/28/24 2123   HYDROmorphone (DILAUDID) injection 0.5-1 mg, 0.5-1 mg, Intravenous, Q2H PRN, Tressie Stalker, MD   insulin aspart (novoLOG) injection 0-15 Units, 0-15 Units, Subcutaneous, TID WC, Tressie Stalker, MD, 5 Units at 01/29/24 1224   insulin aspart (novoLOG) injection 0-5 Units, 0-5  Units,  Subcutaneous, QHS, Tressie Stalker, MD   menthol-cetylpyridinium (CEPACOL) lozenge 3 mg, 1 lozenge, Oral, PRN **OR** phenol (CHLORASEPTIC) mouth spray 1 spray, 1 spray, Mouth/Throat, PRN, Tressie Stalker, MD   metFORMIN (GLUCOPHAGE-XR) 24 hr tablet 1,000 mg, 1,000 mg, Oral, Q breakfast, Tressie Stalker, MD, 1,000 mg at 01/29/24 0915   metoprolol succinate (TOPROL-XL) 24 hr tablet 25 mg, 25 mg, Oral, BID, Tressie Stalker, MD, 25 mg at 01/29/24 0914   ondansetron (ZOFRAN) tablet 4 mg, 4 mg, Oral, Q6H PRN **OR** ondansetron (ZOFRAN) injection 4 mg, 4 mg, Intravenous, Q6H PRN, Tressie Stalker, MD, 4 mg at 01/28/24 2107   oxyCODONE (Oxy IR/ROXICODONE) immediate release tablet 10 mg, 10 mg, Oral, Q3H PRN, Tressie Stalker, MD, 10 mg at 01/29/24 1223   oxyCODONE (Oxy IR/ROXICODONE) immediate release tablet 5 mg, 5 mg, Oral, Q3H PRN, Tressie Stalker, MD   potassium chloride SA (KLOR-CON M) CR tablet 20 mEq, 20 mEq, Oral, Daily, Tressie Stalker, MD, 20 mEq at 01/29/24 0915   pramipexole (MIRAPEX) tablet 1 mg, 1 mg, Oral, QHS, Tressie Stalker, MD, 1 mg at 01/28/24 2123   sodium chloride flush (NS) 0.9 % injection 3 mL, 3 mL, Intravenous, Q12H, Tressie Stalker, MD, 3 mL at 01/29/24 0918   sodium chloride flush (NS) 0.9 % injection 3 mL, 3 mL, Intravenous, PRN, Tressie Stalker, MD   zolpidem Remus Loffler) tablet 5 mg, 5 mg, Oral, QHS PRN, Tressie Stalker, MD     Patients Current Diet:  Diet Order                  Diet Carb Modified Fluid consistency: Thin; Room service appropriate? Yes  Diet effective now                         Precautions / Restrictions Precautions Precautions: Back Precaution Booklet Issued: Yes (comment) Precaution/Restrictions Comments: Reviewed precautions verbally during functional mobility. Spinal Brace: Lumbar corset, Applied in sitting position Restrictions Weight Bearing Restrictions Per Provider Order: No    Has the patient had 2 or more falls or a fall with  injury in the past year?Yes   Prior Activity Level Community (5-7x/wk): Pt. active in the community   Prior Functional Level Prior Function Prior Level of Function : Independent/Modified Independent   Self Care: Did the patient need help bathing, dressing, using the toilet or eating?  Independent   Indoor Mobility: Did the patient need assistance with walking from room to room (with or without device)? Independent   Stairs: Did the patient need assistance with internal or external stairs (with or without device)? Independent   Functional Cognition: Did the patient need help planning regular tasks such as shopping or remembering to take medications? Independent   Patient Information Are you of Hispanic, Latino/a,or Spanish origin?: A. No, not of Hispanic, Latino/a, or Spanish origin What is your race?: A. White Do you need or want an interpreter to communicate with a doctor or health care staff?: 0. No   Patient's Response To:  Health Literacy and Transportation Is the patient able to respond to health literacy and transportation needs?: Yes Health Literacy - How often do you need to have someone help you when you read instructions, pamphlets, or other written material from your doctor or pharmacy?: Never In the past 12 months, has lack of transportation kept you from medical appointments or from getting medications?: No In the past 12 months, has lack of transportation kept you from meetings, work, or from  getting things needed for daily living?: No   Home Assistive Devices / Equipment Home Equipment: Shower seat, BSC/3in1, Rollator (4 wheels)   Prior Device Use: Indicate devices/aids used by the patient prior to current illness, exacerbation or injury? Walker   Current Functional Level Cognition   Orientation Level: Oriented X4    Extremity Assessment (includes Sensation/Coordination)   Upper Extremity Assessment: Defer to OT evaluation  Lower Extremity Assessment: RLE  deficits/detail, LLE deficits/detail RLE Deficits / Details: Decreased strength and sensation in lower leg post-op. Pt reports no sensation on top of foot to light touch, and decreased sensation up to knee. 4-/5 strength grossly. LLE Deficits / Details: Significantly decreased sensation at knee, and no sensation from below knee down to foot. 2/5 strength in quads, ankle DF.     ADLs   Overall ADL's : Needs assistance/impaired Eating/Feeding: Independent, Sitting Grooming: Wash/dry hands, Wash/dry face, Set up, Sitting Upper Body Bathing: Set up, Sitting Lower Body Bathing: Moderate assistance, Sitting/lateral leans Upper Body Dressing : Set up, Sitting Lower Body Dressing: Moderate assistance, Sitting/lateral leans Toilet Transfer: Moderate assistance, Transfer board, BSC/3in1     Mobility   Overal bed mobility: Needs Assistance Bed Mobility: Sit to Sidelying, Rolling Rolling: Contact guard assist Sidelying to sit: Min assist Sit to sidelying: Mod assist General bed mobility comments: Assist for LE elevation back up to bed at end of session and for repositioning in bed.     Transfers   Overall transfer level: Needs assistance Equipment used: Rolling walker (2 wheels) Transfers: Sit to/from Stand Sit to Stand: Contact guard assist, Via lift equipment, From elevated surface Transfer via Lift Equipment: Stedy General transfer comment: Stedy utilized for sit>stand due to L knee buckling. In standing, pt with decreased ability to demonstrate quad activation and TKE.     Ambulation / Gait / Stairs / Wheelchair Mobility   Ambulation/Gait Pre-gait activities: In standing within the frame of the [place], pt attempted terminal knee extension and quad activation. Pt tolerated ~10 minutes total of standing (with seated rest breaks intermittently), and pt was able to engage quad 3 separate times. Able to shift weight to the R, but unable to demonstrate heel raise on the L.     Posture / Balance  Balance Overall balance assessment: Needs assistance Sitting-balance support: Feet supported Sitting balance-Leahy Scale: Good Standing balance support: Reliant on assistive device for balance Standing balance-Leahy Scale: Zero Standing balance comment: unable to maintain stand without support to L knee     Special needs/care consideration Skin surgical incision  and Special service needs corset brace        Previous Home Environment (from acute therapy documentation) Living Arrangements: Spouse/significant other, Children Available Help at Discharge: Family Type of Home: House Home Layout: One level Home Access: Stairs to enter Entrance Stairs-Rails: None Entrance Stairs-Number of Steps: 1&1 Bathroom Shower/Tub: Engineer, manufacturing systems: Standard Bathroom Accessibility: No   Discharge Living Setting Plans for Discharge Living Setting: House Type of Home at Discharge: House Discharge Home Layout: One level Discharge Home Access: Stairs to enter Entrance Stairs-Rails: None Entrance Stairs-Number of Steps: 2 Discharge Bathroom Shower/Tub: Tub/shower unit Discharge Bathroom Toilet: Standard Discharge Bathroom Accessibility: Yes How Accessible: Accessible via walker   Social/Family/Support Systems Patient Roles: Spouse Contact Information: [phone] Anticipated Caregiver: Carmelina Paddock Caregiver Availability: 24/7 Discharge Plan Discussed with Primary Caregiver: Yes Is Caregiver In Agreement with Plan?: Yes Does Caregiver/Family have Issues with Lodging/Transportation while Pt is in Rehab?: No     Goals Patient/Family  Goal for Rehab: PT/OT Mod I Expected length of stay: 9-14 days Pt/Family Agrees to Admission and willing to participate: Yes Program Orientation Provided & Reviewed with Pt/Caregiver Including Roles  & Responsibilities: Yes     Decrease burden of Care through IP rehab admission: not anticipated     Possible need for SNF placement upon  discharge:not anticipated   Patient Condition: This patient's medical and functional status has changed since the consult dated: 01/29/24 in which the Rehabilitation Physician determined and documented that the patient's condition is appropriate for intensive rehabilitative care in an inpatient rehabilitation facility. See "History of Present Illness" (above) for medical update. Functional changes are: Currently requiring min to mod assist. Patient's medical and functional status update has been discussed with the Rehabilitation physician and patient remains appropriate for inpatient rehabilitation. Will admit to inpatient rehab today.   Preadmission Screen Completed By:  Jeronimo Greaves, CCC-SLP, 01/29/2024 3:07 PM ______________________________________________________________________   Discussed status with Dr. Riley Kill on 02/01/24 at 9:32 AM and received approval for admission today.   Admission Coordinator:  Jeronimo Greaves, time 9:32 AM/Date 02/01/24

## 2024-02-01 NOTE — TOC Transition Note (Signed)
 Transition of Care [facility]) - Discharge Note   Patient Details  Name: Stacy Frost MRN: [id number] Date of Birth: 12/28/1960  [facility] CM/SW Contact:  Kermit Balo, RN Phone Number: 02/01/2024, 10:00 AM   Clinical Narrative:     Pt is discharging to CIR today. CM signing off.   Final next level of care: IP Rehab Facility Barriers to Discharge: No Barriers Identified   Patient Goals and CMS Choice            Discharge Placement                       Discharge Plan and Services Additional resources added to the After Visit Summary for                                       Social Drivers of Health (SDOH) Interventions SDOH Screenings   Food Insecurity: No Food Insecurity (01/29/2024)  Housing: Low Risk  (01/30/2024)  Transportation Needs: No Transportation Needs (01/29/2024)  Utilities: Not At Risk (01/29/2024)  Depression (PHQ2-9): Low Risk  (09/14/2023)  Stress: No Stress Concern Present (11/27/2019)   Received from [facility]  Tobacco Use: Low Risk  (01/30/2024)  Health Literacy: Low Risk  (02/04/2023)   Received from [facility]     Readmission Risk Interventions     No data to display

## 2024-02-01 NOTE — Progress Notes (Signed)
   Providing Compassionate, Quality Care - Together   Subjective: Patient reports she still feels "draggy."  Objective: Vital signs in last 24 hours: Temp:  [98.2 F (36.8 C)-103.3 F (39.6 C)] 100.2 F (37.9 C) (04/04 0729) Pulse Rate:  [85-102] 90 (04/04 0729) Resp:  [16-20] 17 (04/04 0729) BP: (101-148)/(52-84) 118/56 (04/04 0729) SpO2:  [93 %-99 %] 94 % (04/04 0729)  Intake/Output from previous day: No intake/output data recorded. Intake/Output this shift: No intake/output data recorded.  Alert and oriented x 4 PERRLA CN II-XII grossly intact MAE, Sensation intact, Strength left deltoid 4/5, left hip extension 4-/5 Incision is covered with Honeycomb dressing and Steri Strips; Dressing is clean, dry, and intact  Lab Results: Recent Labs    01/31/24 1253  WBC 14.4*  HGB 7.3*  HCT 21.4*  PLT 176   BMET Recent Labs    01/31/24 1144  NA 134*  K 3.7  CL 98  CO2 23  GLUCOSE 259*  BUN 12  CREATININE 0.92  CALCIUM 8.6*    Studies/Results: DG CHEST PORT 1 VIEW Result Date: 01/31/2024 CLINICAL DATA:  Hypoxia EXAM: PORTABLE CHEST 1 VIEW COMPARISON:  09/26/2023 FINDINGS: Single frontal view of the chest demonstrates stable postsurgical changes from median sternotomy and aortic valve replacement. Cardiac silhouette is unremarkable. No acute airspace disease, effusion, or pneumothorax. No acute bony abnormalities. IMPRESSION: 1. No acute intrathoracic process. Electronically Signed   By: Sharlet Salina M.D.   On: 01/31/2024 16:19    Assessment/Plan: Ms. Rivers underwent a three level lumbar fusion by Dr. Lovell Sheehan on 01/28/2024. Her work up yesterday did not reveal a possible source of infection. Her surgical site looks good. Her hemoglobin is 7.3 and she appears symptomatic.   LOS: 4 days   -Transfuse 1 unit pRBCs. -Patient is stable for d/c to CIR.  I am in communication with my attending and they agree with the plan for this patient.   Val Eagle, DNP,  AGNP-C Nurse Practitioner  [facility] [address], [place], [place] [address] P: [phone]    F: [phone]  02/01/2024, 9:17 AM

## 2024-02-01 NOTE — Discharge Instructions (Signed)
Wound Care Keep incision covered and dry until post op day 3. You may remove the Honeycomb dressing on post op day 3. Leave steri-strips on back.  They will fall off by themselves. Do not put any creams, lotions, or ointments on incision. You are fine to shower. Let water run over incision and pat dry.  Activity Walk each and every day, increasing distance each day. No lifting greater than 5 lbs.  Avoid excessive back motion. No driving for 2 weeks; may ride as a passenger locally.  Diet Resume your normal diet.  Call Your Doctor If Any of These Occur Redness, drainage, or swelling at the wound.  Temperature greater than 101 degrees. Severe pain not relieved by pain medication. Incision starts to come apart.  Follow Up Appt Call [phone] today for appointment in 2-3 weeks if you don't already have one or for any problems.  If you have any hardware placed in your spine, you will need an x-ray before your appointment.  

## 2024-02-02 ENCOUNTER — Encounter (HOSPITAL_COMMUNITY)

## 2024-02-02 DIAGNOSIS — T148XXA Other injury of unspecified body region, initial encounter: Secondary | ICD-10-CM

## 2024-02-02 DIAGNOSIS — E119 Type 2 diabetes mellitus without complications: Secondary | ICD-10-CM | POA: Diagnosis not present

## 2024-02-02 DIAGNOSIS — I10 Essential (primary) hypertension: Secondary | ICD-10-CM | POA: Diagnosis not present

## 2024-02-02 DIAGNOSIS — K5900 Constipation, unspecified: Secondary | ICD-10-CM

## 2024-02-02 DIAGNOSIS — M5416 Radiculopathy, lumbar region: Secondary | ICD-10-CM | POA: Diagnosis not present

## 2024-02-02 LAB — CBC
HCT: 26.7 % — ABNORMAL LOW (ref 36.0–46.0)
Hemoglobin: 9.1 g/dL — ABNORMAL LOW (ref 12.0–15.0)
MCH: 28.1 pg (ref 26.0–34.0)
MCHC: 34.1 g/dL (ref 30.0–36.0)
MCV: 82.4 fL (ref 80.0–100.0)
Platelets: 249 10*3/uL (ref 150–400)
RBC: 3.24 MIL/uL — ABNORMAL LOW (ref 3.87–5.11)
RDW: 13.5 % (ref 11.5–15.5)
WBC: 8.4 10*3/uL (ref 4.0–10.5)
nRBC: 0 % (ref 0.0–0.2)

## 2024-02-02 LAB — BASIC METABOLIC PANEL WITH GFR
Anion gap: 13 (ref 5–15)
BUN: 14 mg/dL (ref 8–23)
CO2: 26 mmol/L (ref 22–32)
Calcium: 8.5 mg/dL — ABNORMAL LOW (ref 8.9–10.3)
Chloride: 94 mmol/L — ABNORMAL LOW (ref 98–111)
Creatinine, Ser: 0.89 mg/dL (ref 0.44–1.00)
GFR, Estimated: >60 mL/min (ref >60)
Glucose, Bld: 179 mg/dL — ABNORMAL HIGH (ref 70–99)
Potassium: 3.6 mmol/L (ref 3.5–5.1)
Sodium: 133 mmol/L — ABNORMAL LOW (ref 135–145)

## 2024-02-02 LAB — GLUCOSE, CAPILLARY
Glucose-Capillary: 153 mg/dL — ABNORMAL HIGH (ref 70–99)
Glucose-Capillary: 180 mg/dL — ABNORMAL HIGH (ref 70–99)
Glucose-Capillary: 194 mg/dL — ABNORMAL HIGH (ref 70–99)
Glucose-Capillary: 217 mg/dL — ABNORMAL HIGH (ref 70–99)

## 2024-02-02 LAB — TYPE AND SCREEN
ABO/RH(D): A NEG
Antibody Screen: NEGATIVE
Unit division: 0

## 2024-02-02 LAB — BPAM RBC
Blood Product Expiration Date: 202504222359
ISSUE DATE / TIME: 202504041209
Unit Type and Rh: 600

## 2024-02-02 MED ORDER — POLYETHYLENE GLYCOL 3350 17 G PO PACK
17.0000 g | PACK | Freq: Every day | ORAL | Status: DC
  Administered 2024-02-02 – 2024-02-13 (×9): 17 g via ORAL
  Filled 2024-02-02 (×12): qty 1

## 2024-02-02 MED ORDER — BACITRACIN ZINC 500 UNIT/GM EX OINT
TOPICAL_OINTMENT | Freq: Two times a day (BID) | CUTANEOUS | Status: DC
  Administered 2024-02-02 – 2024-02-13 (×20): 31.5 via TOPICAL
  Filled 2024-02-02: qty 28.4

## 2024-02-02 NOTE — Progress Notes (Signed)
 PROGRESS NOTE   Subjective/Complaints: Patient reports she feels little tired after therapy session.  LBM was 4-3, she does not feel constipated.  She notes a small abrasion on her chin, says she notices since the surgery.   ROS: Patient denies fever, new vision changes, dizziness, nausea, vomiting, diarrhea,  shortness of breath or chest pain, headache, or mood change.   Objective:   DG CHEST PORT 1 VIEW Result Date: 01/31/2024 CLINICAL DATA:  Hypoxia EXAM: PORTABLE CHEST 1 VIEW COMPARISON:  09/26/2023 FINDINGS: Single frontal view of the chest demonstrates stable postsurgical changes from median sternotomy and aortic valve replacement. Cardiac silhouette is unremarkable. No acute airspace disease, effusion, or pneumothorax. No acute bony abnormalities. IMPRESSION: 1. No acute intrathoracic process. Electronically Signed   By: Sharlet Salina M.D.   On: 01/31/2024 16:19   Recent Labs    01/31/24 1253  WBC 14.4*  HGB 7.3*  HCT 21.4*  PLT 176   Recent Labs    01/31/24 1144  NA 134*  K 3.7  CL 98  CO2 23  GLUCOSE 259*  BUN 12  CREATININE 0.92  CALCIUM 8.6*    Intake/Output Summary (Last 24 hours) at 02/02/2024 1115 Last data filed at 02/02/2024 0834 Gross per 24 hour  Intake 710 ml  Output --  Net 710 ml        Physical Exam: Vital Signs Blood pressure (!) 130/55, pulse 77, temperature 98.2 F (36.8 C), temperature source Oral, resp. rate 16, height 5\' 6"  (1.676 m), weight 87.1 kg, SpO2 96%.   General: Alert and oriented x 3, No apparent distress HEENT: Head is normocephalic, atraumatic, PERRLA, EOMI, sclera anicteric, oral mucosa pink and moist, dentition intact, ext ear canals clear,  Neck: Supple without JVD or lymphadenopathy Heart: Reg rate and rhythm. No murmurs rubs or gallops Chest: CTA bilaterally without wheezes, rales, or rhonchi; no distress Abdomen: Soft, non-tender, non-distended, bowel sounds  positive. Extremities: No clubbing, cyanosis, or edema. Pulses are 2+ Psych: Pt's affect is a little flat Skin: Back incision Clean and intact , small abrasion on L chin Neuro:  Alert and oriented x 3. Normal insight and awareness. Intact Memory. Normal language and speech. Cranial nerve exam unremarkable. MMT: RUE 5/5. LUE 4+/5 prox to distal. RLE HF 2+/5 with pain inhibition, KE 3+, ADF/PF 4/5. LLE 2- to 2+/5 HF KE with pain component to weakness and 1+ to 2/5 ADF/PF. Decreased LT and PP bilateral LE's below knee. Intact saddle sensation. DTR's tr-1+. No abnl resting tone. No cerebellar signs. .  Musculoskeletal: Low back tender to palpation and attempts at trunk movement. Pt wearing LSO    Assessment/Plan: 1. Functional deficits which require 3+ hours per day of interdisciplinary therapy in a comprehensive inpatient rehab setting. Physiatrist is providing close team supervision and 24 hour management of active medical problems listed below. Physiatrist and rehab team continue to assess barriers to discharge/monitor patient progress toward functional and medical goals  Care Tool:  Bathing    Body parts bathed by patient: Right arm, Left arm, Chest, Abdomen, Front perineal area, Buttocks, Right upper leg, Face, Left upper leg   Body parts bathed by helper: Right lower leg, Left  lower leg     Bathing assist Assist Level: Minimal Assistance - Patient > 75%     Upper Body Dressing/Undressing Upper body dressing   What is the patient wearing?: Pull over shirt    Upper body assist Assist Level: Supervision/Verbal cueing    Lower Body Dressing/Undressing Lower body dressing      What is the patient wearing?: Pants, Underwear/pull up     Lower body assist Assist for lower body dressing: Moderate Assistance - Patient 50 - 74%     Toileting Toileting    Toileting assist Assist for toileting: Minimal Assistance - Patient > 75%     Transfers Chair/bed transfer  Transfers  assist     Chair/bed transfer assist level: Minimal Assistance - Patient > 75%     Locomotion Ambulation   Ambulation assist              Walk 10 feet activity   Assist           Walk 50 feet activity   Assist           Walk 150 feet activity   Assist           Walk 10 feet on uneven surface  activity   Assist           Wheelchair     Assist               Wheelchair 50 feet with 2 turns activity    Assist            Wheelchair 150 feet activity     Assist          Blood pressure (!) 130/55, pulse 77, temperature 98.2 F (36.8 C), temperature source Oral, resp. rate 16, height 5\' 6"  (1.676 m), weight 87.1 kg, SpO2 96%.  Medical Problem List and Plan: 1. 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   -Back corset when out of bed             -patient may shower             -ELOS/Goals: 9-14 days  - Continue CIR, evaluations pending 2.  Antithrombotics: -DVT/anticoagulation:  Mechanical: Antiembolism stockings, thigh (TED hose) Bilateral lower extremities             -antiplatelet therapy: N/A 3. Pain Management: Neurontin 100 mg daily and 200 mg nightly, Flexeril and oxycodone as needed             -might benefit from scheduled oxycodone prior to therapies 4. Mood/Behavior/Sleep: Celexa 40 mg daily, Klonopin as needed             -antipsychotic agents: N/A             -pt with anxiety about therapy tomorrow. Reassured her that team will work with her at her pace and be considerate of her pain. I also told her family could stay over with her if it helped her anxiety.  5. Neuropsych/cognition: This patient is capable of making decisions on her own behalf. 6. Skin/Wound Care: Routine skin checks 7. Fluids/Electrolytes/Nutrition: Routine in and outs                -mild hyponatremia on recent labs-              -f/u labs Monday   -Recheck labs 8.  Acute blood loss anemia on chronic anemia:        -  pt transfused 1u PRBC today for hgb of 7.3 - Follow-up CBC in AM -fe++ supp -Order repeat CBC  9.  History aortic stenosis/MVP.  Status post aortic valve replacement July 2024 per Dr. Cliffton Asters 10.  Hypertension.  Toprol-XL 25 mg twice daily, Lasix 40 mg daily.  Monitor with increased mobility             -controlled at present  -4/5 controlled, continue to monitor       02/02/2024    5:43 AM 02/01/2024    9:14 PM 02/01/2024    4:13 PM  Vitals with BMI  Height   5\' 6"   Weight   192 lbs  BMI   31  Systolic 130 114   Diastolic 55 52   Pulse 77 86     11.  Hyperlipidemia.  Lipitor 12.  Diabetes mellitus with peripheral neuropathy.  Latest hemoglobin A1c 9.7.                -sugars poorly controlled at present as well              -pt is on metformin XR 500mg  2 tablets daily as she takes at home             -SSI             -might need adjustments to her standing regimen   - 4/5 CBGs fair control continue to monitor CBG (last 3)  Recent Labs    02/01/24 1619 02/01/24 2123 02/02/24 0600  GLUCAP 171* 166* 153*    14.  Hyperlipidemia.  Lipitor 15.  Restless leg syndrome.  Mirapex 1 mg nightly 16.  Obesity.  BMI 30.99.  Dietary follow-up 17. Constipation  - LBM 4/3, start MiraLAX daily 18. Small abrasion ok chin  -bacitracin, she reports she is allergic to tape      LOS: 1 days A FACE TO FACE EVALUATION WAS PERFORMED  Fanny Dance 02/02/2024, 11:15 AM

## 2024-02-02 NOTE — Discharge Instructions (Addendum)
 Inpatient Rehab Discharge Instructions  Glennda Weatherholtz Discharge date and time: No discharge date for patient encounter.   Activities/Precautions/ Functional Status: Activity: As tolerated with a back corset when out of bed Diet: Diabetic diet Wound Care: Routine skin checks Functional status:  ___ No restrictions     ___ Walk up steps independently ___ 24/7 supervision/assistance   ___ Walk up steps with assistance ___ Intermittent supervision/assistance  ___ Bathe/dress independently ___ Walk with walker     _x__ Bathe/dress with assistance ___ Walk Independently    ___ Shower independently ___ Walk with assistance    ___ Shower with assistance ___ No alcohol     ___ Return to work/school ________  Special Instructions: No driving smoking or alcohol    COMMUNITY REFERRALS UPON DISCHARGE:    Outpatient: PT             Agency:[facility]  [address] [place] [address] Phone:[phone]              Appointment Date/Time:WILL CALL TO SET UP FOLLOW UP APPOINTMENTS  Medical Equipment/Items Ordered:HAS ALL NEEDED EQUIPMENT                                                 Agency/Supplier:NA  My questions have been answered and I understand these instructions. I will adhere to these goals and the provided educational materials after my discharge from the hospital.  Patient/Caregiver Signature _______________________________ Date __________  Clinician Signature _______________________________________ Date __________  Please bring this form and your medication list with you to all your follow-up doctor's appointments.

## 2024-02-02 NOTE — Discharge Summary (Signed)
 Physician Discharge Summary  Patient ID: Stacy Frost MRN: [id number] DOB/AGE: 64-Sep-1962 64 y.o.  Admit date: 02/01/2024 Discharge date: 02/13/2024  Discharge Diagnoses:  Principal Problem:   Lumbar radiculopathy DVT prophylaxis Acute blood loss anemia History aortic stenosis/MVP/aortic valve replacement Hypertension Hyperlipidemia Diabetes mellitus with peripheral neuropathy Restless leg syndrome Obesity Mood stabilization Constipation  Discharged Condition: Stable  Significant Diagnostic Studies: CT HEAD WO CONTRAST ( ) Result Date: 02/08/2024 CLINICAL DATA:  Initial evaluation for acute head trauma, fall. EXAM: CT HEAD WITHOUT CONTRAST TECHNIQUE: Contiguous axial images were obtained from the base of the skull through the vertex without intravenous contrast. RADIATION DOSE REDUCTION: This exam was performed according to the departmental dose-optimization program which includes automated exposure control, adjustment of the mA and/or kV according to patient size and/or use of iterative reconstruction technique. COMPARISON:  Prior study from 10/19/2011. FINDINGS: Brain: Cerebral volume within normal limits for patient age. No acute intracranial hemorrhage. No acute large vessel territory infarct. No mass lesion, midline shift, or mass effect. Ventricles are normal in size without hydrocephalus. No extra-axial fluid collection. Vascular: No abnormal hyperdense vessel. Skull: Scalp soft tissues demonstrate no acute abnormality. Calvarium intact. Sinuses/Orbits: Globes and orbital soft tissues within normal limits. Visualized paranasal sinuses are largely clear. No significant mastoid effusion. IMPRESSION: Normal head CT. No acute intracranial abnormality. Electronically Signed   By: Rise Mu M.D.   On: 02/08/2024 01:34   VAS [place] LOWER EXTREMITY VENOUS (DVT) Result Date: 02/03/2024  Lower Venous DVT Study Patient Name:  Stacy Frost  Date of Exam:   02/03/2024 Medical Rec #: [id number]    Accession #:    [id number] Date of Birth: May 03, 1961   Patient Gender: F Patient Age:   63 years Exam Location:  [facility] Procedure:      VAS [place] LOWER EXTREMITY VENOUS (DVT) Referring Phys: Mariam Dollar --------------------------------------------------------------------------------  Indications: Edema. Other Indications: Rehab patient. Comparison Study: Previous exam (reflux study) on 04/12/2020 was negative for                   DVT Performing Technologist: Ernestene Mention RVT, RDMS  Examination Guidelines: A complete evaluation includes B-mode imaging, spectral Doppler, color Doppler, and power Doppler as needed of all accessible portions of each vessel. Bilateral testing is considered an integral part of a complete examination. Limited examinations for reoccurring indications may be performed as noted. The reflux portion of the exam is performed with the patient in reverse Trendelenburg.  +---------+---------------+---------+-----------+----------+--------------+ RIGHT    CompressibilityPhasicitySpontaneityPropertiesThrombus Aging +---------+---------------+---------+-----------+----------+--------------+ CFV      Full           Yes      Yes                                 +---------+---------------+---------+-----------+----------+--------------+ SFJ      Full                                                        +---------+---------------+---------+-----------+----------+--------------+ FV Prox  Full           Yes      Yes                                 +---------+---------------+---------+-----------+----------+--------------+  FV Mid   Full           Yes      Yes                                 +---------+---------------+---------+-----------+----------+--------------+ FV DistalFull           Yes      Yes                                 +---------+---------------+---------+-----------+----------+--------------+ PFV      Full                                                         +---------+---------------+---------+-----------+----------+--------------+ POP      Full           Yes      Yes                                 +---------+---------------+---------+-----------+----------+--------------+ PTV      Full                                                        +---------+---------------+---------+-----------+----------+--------------+ PERO     Full                                                        +---------+---------------+---------+-----------+----------+--------------+   +---------+---------------+---------+-----------+----------+--------------+ LEFT     CompressibilityPhasicitySpontaneityPropertiesThrombus Aging +---------+---------------+---------+-----------+----------+--------------+ CFV      Full           Yes      Yes                                 +---------+---------------+---------+-----------+----------+--------------+ SFJ      Full                                                        +---------+---------------+---------+-----------+----------+--------------+ FV Prox  Full           Yes      Yes                                 +---------+---------------+---------+-----------+----------+--------------+ FV Mid   Full           Yes      Yes                                 +---------+---------------+---------+-----------+----------+--------------+ FV DistalFull  Yes      Yes                                 +---------+---------------+---------+-----------+----------+--------------+ PFV      Full                                                        +---------+---------------+---------+-----------+----------+--------------+ POP      Full           Yes      Yes                                 +---------+---------------+---------+-----------+----------+--------------+ PTV      Full                                                         +---------+---------------+---------+-----------+----------+--------------+ PERO     Full                                                        +---------+---------------+---------+-----------+----------+--------------+     Summary: BILATERAL: - No evidence of deep vein thrombosis seen in the lower extremities, bilaterally. -No evidence of popliteal cyst, bilaterally.   *See table(s) above for measurements and observations. Electronically signed by Genny Kid MD on 02/03/2024 at 10:47:45 AM.    Final    DG CHEST PORT 1 VIEW Result Date: 01/31/2024 CLINICAL DATA:  Hypoxia EXAM: PORTABLE CHEST 1 VIEW COMPARISON:  09/26/2023 FINDINGS: Single frontal view of the chest demonstrates stable postsurgical changes from median sternotomy and aortic valve replacement. Cardiac silhouette is unremarkable. No acute airspace disease, effusion, or pneumothorax. No acute bony abnormalities. IMPRESSION: 1. No acute intrathoracic process. Electronically Signed   By: Bobbye Burrow M.D.   On: 01/31/2024 16:19   DG Lumbar Spine 2-3 Views Result Date: 01/28/2024 CLINICAL DATA:  Lumbar fusion EXAM: LUMBAR SPINE - 2-3 VIEW COMPARISON:  01/28/2024 FINDINGS: Two fluoroscopic images are obtained during the performance of the procedure and are provided for interpretation only. Images demonstrate discectomies at L2-3, L3-4, and L4-5, with transpedicular screws at the L2, L3, L4, and L5 vertebral bodies. Alignment is grossly anatomic. Please refer to operative report. Fluoroscopy time: 24.0 seconds, 20.72 mGy IMPRESSION: 1. Intraoperative evaluation as above. Please refer to the operative report. Electronically Signed   By: Bobbye Burrow M.D.   On: 01/28/2024 20:43   DG Lumbar Spine 1 View Result Date: 01/28/2024 CLINICAL DATA:  Posterior lumbar interbody fusion, intraoperative assessment and level assessment EXAM: LUMBAR SPINE - 1 VIEW COMPARISON:  MRI lumbar spine 11/23/2023 FINDINGS: As on the lumbar MRI, the lowest fully  segmental lumbar type non-rib-bearing vertebra is numbered L5. A lateral view of the lumbar spine with tissue spreader and sponge in place demonstrates a blunt tip probe oriented generally towards the L3-4 intervertebral space; the tip of the probe projects directly over  the lower L3 spinous process. IMPRESSION: 1. L3-4 localization with blunt tip probe. Electronically Signed   By: Freida Jes M.D.   On: 01/28/2024 20:02   DG C-Arm 1-60 Min-No Report Result Date: 01/28/2024 Fluoroscopy was utilized by the requesting physician.  No radiographic interpretation.   DG C-Arm 1-60 Min-No Report Result Date: 01/28/2024 Fluoroscopy was utilized by the requesting physician.  No radiographic interpretation.    Labs:  Basic Metabolic Panel: Recent Labs  Lab 02/07/24 0534 02/11/24 0520  NA 137 138  K 3.9 3.8  CL 97* 100  CO2 28 30  GLUCOSE 222* 210*  BUN 9 11  CREATININE 0.87 0.78  CALCIUM 8.8* 8.8*    CBC: Recent Labs  Lab 02/07/24 0534 02/11/24 0520  WBC 8.5 7.5  NEUTROABS 5.2  --   HGB 8.6* 8.8*  HCT 25.9* 26.7*  MCV 84.4 85.6  PLT 364 437*    CBG: Recent Labs  Lab 02/11/24 2115 02/12/24 0600 02/12/24 1205 02/12/24 1633 02/12/24 2123  GLUCAP 330* 160* 217* 144* 231*   Family history.  Mother with depression as well as cancer.  Mother with diabetes.  Father with hypertension.  Denies any colon cancer esophageal cancer or rectal  Brief HPI:   Stacy Frost is a 63 y.o. right-handed female with history significant for diabetes mellitus with peripheral neuropathy, anxiety, aortic stenosis MVP status post aortic valve replacement July 2024 per Dr. Deloise Ferries, hypertension chronic anemia hyperlipidemia migraine headaches restless leg syndrome.  Per chart review lives with spouse 1 level home.  Used a 4 wheeled walker for mobility and was able to perform her own ADLs.  Spouse just retired and can provide assistance.  Presented 01/28/2024 with increasing low back pain and bilateral  buttock/leg pain consistent with neurogenic claudication.  MRI revealed multilevel spondylolisthesis and stenosis compressing the L2-3, L3 and L4 nerve roots.  Underwent elective bilateral L2-3, 3-4 and L4-5 laminotomies foraminotomies to decompress the L2-L5 nerve root as well as PLIF L2-L5 by Dr. Garry Kansas 01/28/2024.  Postoperatively lumbar corset applied in sitting position when out of bed.  Pain management with the use of scheduled Neurontin with Flexeril and oxycodone as needed.  Foley tube removed 12/20/2023 as well as Hemovac drain.  She did spike a fever on 01/31/2024 received Tylenol.  Follow-up labs performed showed WBC of 14,400 as well as hemoglobin 7.3 from a preop of 13.2 and sodium 134.  Urinalysis negative nitrite respiratory panel negative.  Chest x-ray negative and blood cultures pending.  She was transfused 1 unit packed red blood cells 02/01/2024.  Therapy evaluations completed due to patient decreased functional mobility was admitted for a comprehensive rehab program.   Hospital Course: Lelah Rennaker was admitted to rehab 02/01/2024 for inpatient therapies to consist of PT, ST and OT at least three hours five days a week. Past admission physiatrist, therapy team and rehab RN have worked together to provide customized collaborative inpatient rehab.  Pertaining to patient's multilevel radiculopathy L2-L5 severe stenosis spondylolisthesis status post decompression and PLIF 01/28/2024.  Surgical site healing nicely followed by neurosurgery Dr. Garry Kansas back corset when out of bed.  SCDs for DVT prophylaxis venous Doppler studies negative.  Pain management with the use of Neurontin titrated with Flexeril and oxycodone as needed.  Mood stabilization maintained on Celexa 40 mg daily Klonopin as needed emotional support provided.  Acute blood loss anemia stable she was transfused postoperatively no bleeding episodes.  Blood sugars maintain hemoglobin A1c 9.7 maintained on metformin and she  would  also resume her Mounjaro and Semglee at home.  Blood pressure controlled on Toprol as well as Lasix 20 mg daily.  Lipitor ongoing for hyperlipidemia.  History of restless leg syndrome continued on Mirapex nightly.  Obesity BMI 30.99 with dietary follow-up.  Bouts of constipation with Colace 100 mg twice daily as well as MiraLAX daily.   Blood pressures were monitored on TID basis and remained controlled and monitored  Diabetes has been monitored with ac/hs CBG checks and SSI was use prn for tighter BS control.    Rehab course: During patient's stay in rehab weekly team conferences were held to monitor patient's progress, set goals and discuss barriers to discharge. At admission, patient required moderate assist 20 feet rolling walker moderate assist step pivot transfers  Physical exam.  Blood pressure 148/72 pulse 95 temperature 99 respirations 18 oxygen saturation 99% room air Constitutional.  No acute distress HEENT Head.  Normocephalic and atraumatic Eyes.  Pupils round and reactive to light no discharge without nystagmus Neck.  Supple nontender no JVD without thyromegaly Cardiac regular rate and rhythm without any extra sounds or murmur heard Abdomen.  Soft nontender positive bowel sounds without rebound Respiratory effort normal no respiratory distress without wheeze Skin.  Back incision clean dry and intact Neurologic.  Alert and oriented x 3.  Normal insight and awareness.  Intact memory normal language and speech.  Cranial nerve exam unremarkable.  Manual muscle testing right upper extremity 5/5 left upper extremity 4+/5 proximal to distal.  Right lower extremity hip flexor 2+/5 with pain inhibition, knee extension 3+, ADF/APF 4/5.  Left lower extremity 2 - to 2+/5 HF KE with pain component to weakness and 1+ to 2/5 ADF/PF.  Decreased light touch and PP bilateral lower extremities below knee.  He/She  has had improvement in activity tolerance, balance, postural control as well as  ability to compensate for deficits. He/She has had improvement in functional use RUE/LUE  and RLE/LLE as well as improvement in awareness.  Contact-guard assist for stand pivot and ambulating 75 feet rolling walker with adequate rest.  Transition to stair training with lateral step and right lower extremity with bilateral upper extremity support on handrail due to left knee buckling.  Completed bed mobility standby assist.  Grooming seated edge of bed with standby assist.  Upper body dressing standby assist lower body dressing minimal assist.  She was wearing her back brace when out of bed.  Full family teaching completed plan discharge to home       Disposition:  Discharge disposition: 01-Home or Self Care        Diet: Diabetic diet  Special Instructions: No driving smoking or alcohol  Back corset when out of bed  Medications at discharge. 1.  Tylenol as needed 2.  Lipitor 80 mg p.o. daily 3.  Celexa 40 mg p.o. daily 4.  Klonopin 0.5 mg p.o. twice daily as needed anxiety 5.  Flexeril 10 mg p.o. 3 times daily as needed 6.  Colace 100 mg p.o. twice daily 7.  Ferrous sulfate 325 mg p.o. daily 8.  Lasix 20 mg p.o. daily 9.  Neurontin 100 mg p.o. daily and 200 mg nightly 10.  Glucophage 1000 mg p.o. daily 11.  Toprol-XL 25 mg p.o. twice daily 12.  Oxycodone 5 mg every 4 hours as needed pain 13.  MiraLAX daily hold for loose stools 14.  Klor-Con 20 mill equivalents p.o. daily 15.  Mirapex 1 mg p.o. nightly 16.  Aspirin 325 mg daily  17.  Trazodone 150 mg at bedtime 18.  Semglee 24 units bedtime 19.  Mounjaro 7.5 mg weekly   30-35 minutes were spent completing discharge summary and discharge planning   Discharge Instructions     Ambulatory referral to Physical Medicine Rehab   Complete by: As directed    Moderate complexity follow-up 1 to 2 weeks lumbar radiculopathy        Follow-up Information     Lovorn, Jacqlyn Matas, MD Follow up.   Specialty: Physical Medicine and  Rehabilitation Why: Only as directed Contact information: [address] [place] [address] [phone]         Garry Kansas, MD Follow up.   Specialty: Neurosurgery Why: Call for appointment Contact information: [address] [place] [address] [phone]         Hilarie Lovely, MD Follow up.   Specialty: Cardiothoracic Surgery Why: Call for appointment as needed Contact information: [address] [place] [address] [phone]                 Signed: Everlyn Hockey Raylen Tangonan 02/13/2024, 4:53 AM

## 2024-02-02 NOTE — Plan of Care (Signed)
  Problem: Consults Goal: RH SPINAL CORD INJURY PATIENT EDUCATION Description:  See Patient Education module for education specifics.  Outcome: Progressing   Problem: SCI BOWEL ELIMINATION Goal: RH STG MANAGE BOWEL WITH ASSISTANCE Description: STG Manage Bowel with mod I Assistance. Outcome: Progressing   Problem: SCI BLADDER ELIMINATION Goal: RH STG MANAGE BLADDER WITH ASSISTANCE Description: STG Manage Bladder With Mod I  Assistance Outcome: Progressing   Problem: RH SKIN INTEGRITY Goal: RH STG SKIN FREE OF INFECTION/BREAKDOWN Description: Manage  skin free of infection/breakdown with mod I assistance Outcome: Progressing

## 2024-02-02 NOTE — Plan of Care (Signed)
  Problem: Consults Goal: RH SPINAL CORD INJURY PATIENT EDUCATION Description:  See Patient Education module for education specifics.  Outcome: Progressing   Problem: SCI BOWEL ELIMINATION Goal: RH STG MANAGE BOWEL WITH ASSISTANCE Description: STG Manage Bowel with mod I Assistance. Outcome: Progressing   Problem: SCI BLADDER ELIMINATION Goal: RH STG MANAGE BLADDER WITH ASSISTANCE Description: STG Manage Bladder With Mod I  Assistance Outcome: Progressing   Problem: RH SKIN INTEGRITY Goal: RH STG SKIN FREE OF INFECTION/BREAKDOWN Description: Manage  skin free of infection/breakdown with mod I assistance Outcome: Progressing   Problem: RH SAFETY Goal: RH STG ADHERE TO SAFETY PRECAUTIONS W/ASSISTANCE/DEVICE Description: STG Adhere to Safety Precautions With  mod I Assistance/Device. Outcome: Progressing   Problem: RH PAIN MANAGEMENT Goal: RH STG PAIN MANAGED AT OR BELOW PT'S PAIN GOAL Description: <4 w/ prns Outcome: Progressing   Problem: RH KNOWLEDGE DEFICIT SCI Goal: RH STG INCREASE KNOWLEDGE OF SELF CARE AFTER SCI Description: Manage  increase knowledge f self care after SCI with mod I assistance using educational materials provided Outcome: Progressing

## 2024-02-02 NOTE — Progress Notes (Signed)
 Inpatient Rehabilitation Admission Medication Review by a Pharmacist  A complete drug regimen review was completed for this patient to identify any potential clinically significant medication issues.  High Risk Drug Classes Is patient taking? Indication by Medication  Antipsychotic No   Anticoagulant No   Antibiotic No   Opioid Yes Oxycodone - pain  Antiplatelet No   Hypoglycemics/insulin Yes Sliding Scale Insulin, Metformin - DM  Vasoactive Medication Yes Metoprolol XL - HTN  Chemotherapy No   Other No APAP - pain Atorvastatin - HLD Citalopram - mood Lasix - Diuresis Gabapentin -  neuropathic pain Pramipexole - restless legs Clonazepam - anxiety Flexeril - muscle spasm Colace, Bisacodyl, Miralax- bowel regimen Fe, KCl - supplement Zofran - nausea Zolpidem - sleep     Type of Medication Issue Identified Description of Issue Recommendation(s)  Drug Interaction(s) (clinically significant)     Duplicate Therapy     Allergy     No Medication Administration End Date     Incorrect Dose     Additional Drug Therapy Needed     Significant med changes from prior encounter (inform family/care partners about these prior to discharge). The following medications were intended to be resumed following acute admission:  Tylenol #3 1 tab PO bid prn ASA 325 PO daily Glargine Insulin 30 units hs Mounjaro 7.5mg  SQ weekly K20 PO daily Prometh 25 bid prn nausea Trazodone 150mg   qhs  The following medications were discontinued on acute admission: Celebrex 200mg  bid Please review and resume as appropriate.  Note duplicate prn pain medications: Tylenol #3 and Oxycodone.  Other       Clinically significant medication issues were identified that warrant physician communication and completion of prescribed/recommended actions by midnight of the next day:  No  Name of provider notified for urgent issues identified:   Provider Method of Notification:     Pharmacist comments:    Time spent performing this drug regimen review (minutes):  20   Veona Bittman, Judie Bonus 02/02/2024 1:36 PM

## 2024-02-02 NOTE — Progress Notes (Signed)
 Occupational Therapy Session Note  Patient Details  Name: Stacy Frost MRN: [id number] Date of Birth: Oct 11, 1961  Today's Date: 02/02/2024 OT Individual Time: 4098-1191 OT Individual Time Calculation (min): 49 min  and Today's Date: 02/02/2024 OT Missed Time: 10 Minutes Missed Time Reason: Other (comment) (Labs)   Short Term Goals: Week 1:  OT Short Term Goal 1 (Week 1): STGs=LTGs due to patient's estimated length of stay.  Skilled Therapeutic Interventions/Progress Updates:   Pt received resting in recliner, reporting 6/10 surgical pain "right at the base of my spine." Pre-medicated, rest provided as needed. Pt opting to sponge-bathe this session rather than full shower. Min A provided for cleaning of back and adjustment of LSO, otherwise setup A for bathing/dressing of UB. Pt then with urgency, ambulating toilet transfer with CGA + RW, cuing for safe hand placement to support transitional movements as patient with tendency to pull on RW. Pt requires Min A for 3/3 toileting activities to complete thorough pericare in standing, as patient limited by decreased functional reach and pain this session. Cuing provided for unilateral support. Pt then threads BLE into shorts with use of reacher and light Min A, as patient new to use of AE for task. Standing hike with CGA-light Min A for standing balance with use of RW. Pt returns to supine with Min A for BLE, cuing for log roll technique to avoid twist with positioning. At bed-level, patient completes 1x12 reps of chest-level and overhead ab/adduction with yellow theraband for general conditioning. Pt sharing about personal family dynamics throughout, hopeful of returning home soon. Lab technician present at end of session, all immediate needs met.    Precautions:  Precautions Precautions: Back Recall of Precautions/Restrictions: Impaired (Unable to recall "lift") Precaution/Restrictions Comments: Verbally reviewed precautions, re-education on acronym  BLT. Required Braces or Orthoses: Spinal Brace Spinal Brace: Lumbar corset, Applied in sitting position Restrictions Weight Bearing Restrictions Per Provider Order: No   Therapy/Group: Individual Therapy  Lou Cal, OTR/L, MSOT  02/02/2024, 11:12 AM

## 2024-02-02 NOTE — Evaluation (Addendum)
 Physical Therapy Assessment and Plan  Patient Details  Name: Stacy Frost MRN: [id number] Date of Birth: 01/01/1961  PT Diagnosis: Abnormality of gait, Difficulty walking, Low back pain, and Muscle weakness Rehab Potential: Good ELOS: 7-10 days   Today's Date: 02/02/2024 PT Individual Time: 4098-1191 PT Individual Time Calculation (min): 72 min    Hospital Problem: Principal Problem:   Lumbar radiculopathy   Past Medical History:  Past Medical History:  Diagnosis Date   Anxiety    Aortic stenosis    Mild to moderate   Bicuspid aortic valve    Essential hypertension, benign    GERD (gastroesophageal reflux disease)    Heart murmur    S/P Aortic Valve Replacement July 2024   History of cardiac catheterization    Normal coronaries 2008   Hyperlipidemia    Migraine headache    MVP (mitral valve prolapse)    Obesity    Pain in joint, ankle and foot    Chronic   Palpitations    Documented PACs by previous monitoring   Restless leg syndrome    Skin tag    Type 2 diabetes mellitus (HCC)    Unilateral primary osteoarthritis, right knee 09/06/2016   Past Surgical History:  Past Surgical History:  Procedure Laterality Date   ACHILLES TENDON REPAIR  2003   Left   AORTIC VALVE REPLACEMENT N/A 05/14/2023   Procedure: AORTIC VALVE REPLACEMENT (AVR);  Surgeon: Corliss Skains, MD;  Location: [facility];  Service: Open Heart Surgery;  Laterality: N/A;   CARDIAC CATHETERIZATION  2008   Normal coronary arteries; normal LV systolic function; mild dilation of aortic root; mild dilation of proximal ascending aorta; likely bicuspid aortic valve   CHOLECYSTECTOMY  2001   "Poor EF at 17%"   COLONOSCOPY N/A 07/17/2014   Procedure: COLONOSCOPY;  Surgeon: Malissa Hippo, MD;  Location: [facility];  Service: Endoscopy;  Laterality: N/A;  105   ESOPHAGEAL DILATION N/A 06/11/2015   Procedure: ESOPHAGEAL DILATION;  Surgeon: Malissa Hippo, MD;  Location: [facility];  Service: Endoscopy;   Laterality: N/AElease Hashimoto 54/56/58   ESOPHAGOGASTRODUODENOSCOPY N/A 07/17/2014   Procedure: ESOPHAGOGASTRODUODENOSCOPY (EGD);  Surgeon: Malissa Hippo, MD;  Location: [facility];  Service: Endoscopy;  Laterality: N/A;   ESOPHAGOGASTRODUODENOSCOPY (EGD) WITH PROPOFOL N/A 06/11/2015   Procedure: ESOPHAGOGASTRODUODENOSCOPY (EGD) WITH PROPOFOL;  Surgeon: Malissa Hippo, MD;  Location: [facility];  Service: Endoscopy;  Laterality: N/A;  730   KNEE ARTHROSCOPY WITH MEDIAL MENISECTOMY Right 07/02/2018   Procedure: RIGHT KNEE ARTHROSCOPY, DEBRIDEMENT, MEDIAL MENISECTOMY;  Surgeon: [name], MD;  Location: [facility] OR;  Service: Orthopedics;  Laterality: Right;   KNEE SURGERY     MALONEY DILATION N/A 07/17/2014   Procedure: MALONEY DILATION;  Surgeon: Malissa Hippo, MD;  Location: [facility];  Service: Endoscopy;  Laterality: N/A;   RIGHT/LEFT HEART CATH AND CORONARY ANGIOGRAPHY N/A 05/02/2023   Procedure: RIGHT/LEFT HEART CATH AND CORONARY ANGIOGRAPHY;  Surgeon: [place], Peter M, MD;  [facility]: [facility] INVASIVE CV LAB;  Service: Cardiovascular;  Laterality: N/A;   TEE WITHOUT CARDIOVERSION N/A 05/14/2023   Procedure: TRANSESOPHAGEAL ECHOCARDIOGRAM;  Surgeon: Corliss Skains, MD;  Location: [facility] OR;  Service: Open Heart Surgery;  Laterality: N/A;   UMBILICAL HERNIA REPAIR  2003    Assessment & Plan Clinical Impression: Patient is a 63 year old right-handed female with history significant for diabetes mellitus with peripheral neuropathy, anxiety, aortic stenosis/MVP status post aortic valve replacement July 2024 per Dr. Cliffton Asters, hypertension, chronic  anemia, hyperlipidemia, migraine headaches, restless leg syndrome. Per chart review patient lives with spouse. 1 level home one-step to entry. Used a four-wheel rolling walker for mobility and was able to perform her own ADLs. Spouse just retired and can provide assistance as needed. Presented 01/28/2024 with increasing low back and bilateral buttock/leg  pain consistent with neurogenic claudication. MRI revealed multilevel spondylolisthesis and stenosis compressing the L2, L3, L4, and L5 nerve roots. Underwent elective bilateral L2-3, L3-4 and L4-5 laminotomy/foraminotomies/medial facetectomies to decompress the L2-L5 nerve root as well as PLIF L2-L5 by Dr. Tressie Stalker 01/28/2024. Postoperatively lumbar corset applied in sitting position when out of bed. Pain management with the use of scheduled Neurontin with Flexeril and oxycodone as needed. Foley catheter tube removed for 12/20/2023 as well as Hemovac drain. She did spike a fever postoperatively on 01/31/2024 and did receive Tylenol.. Follow-up labs performed showing a WBC of 14,400 as well as hemoglobin 7.3 from a preop of 13.2, sodium 134. Urinalysis negative nitrite and respiratory panel negative,CxR negative. Blood cultures pending. She was transfused one unit PRBC 4/4. Patient transferred to CIR on 02/01/2024.   Patient currently requires min with mobility secondary to muscle weakness and decreased coordination.  Prior to hospitalization, patient was independent  with mobility and lived with Spouse, Daughter in a House home.  Home access is 2 STEStairs to enter.  Patient will benefit from skilled PT intervention to maximize safe functional mobility, minimize fall risk, and decrease caregiver burden for planned discharge home with 24 hour supervision.  Anticipate patient will benefit from follow up HH at discharge.  PT - End of Session Activity Tolerance: Decreased this session;Tolerates 30+ min activity with multiple rests Endurance Deficit: Yes PT Assessment Rehab Potential (ACUTE/IP ONLY): Good PT Patient demonstrates impairments in the following area(s): Balance;Endurance;Motor;Sensory;Safety;Pain PT Transfers Functional Problem(s): Bed Mobility;Car;Furniture PT Locomotion Functional Problem(s): Ambulation;Stairs PT Plan PT Intensity: Minimum of 1-2 x/day ,45 to 90 minutes PT Frequency: 5  out of 7 days PT Duration Estimated Length of Stay: 7-10 days PT Treatment/Interventions: Ambulation/gait training;Balance/vestibular training;Disease management/prevention;Patient/family education;Neuromuscular re-education;Stair training;Functional mobility training;DME/adaptive equipment instruction;Discharge planning;Therapeutic Activities;UE/LE Strength taining/ROM;Therapeutic Exercise;Pain management PT Transfers Anticipated Outcome(s): mod I PT Locomotion Anticipated Outcome(s): mod I PT Recommendation Follow Up Recommendations: Home health PT Patient destination: Home Equipment Recommended: To be determined   PT Evaluation Precautions/Restrictions Precautions Precautions: Back;Fall Required Braces or Orthoses: Spinal Brace Spinal Brace: Lumbar corset;Applied in sitting position Restrictions Weight Bearing Restrictions Per Provider Order: No General   Vital SignsTherapy Vitals Temp: 98.3 F (36.8 C) Pulse Rate: 87 Resp: 16 BP: (!) 137/58 Patient Position (if appropriate): Sitting Oxygen Therapy SpO2: 99 % O2 Device: Room Air Pain Pain Assessment Pain Scale: 0-10 Pain Score: 8  Pain Location: Back Pain Orientation: Lower Pain Descriptors / Indicators: Sharp Pain Onset: Progressive Pain Intervention(s): Repositioned;Distraction Pain Interference   Home Living/Prior Functioning Home Living Available Help at Discharge: Family;Available 24 hours/day (spouse recently retired) Type of Home: House Home Access: Stairs to enter Secretary/administrator of Steps: 2 STE Entrance Stairs-Rails: None Home Layout: One level Bathroom Shower/Tub: Engineer, manufacturing systems: Standard Additional Comments: rollator walker, shower chair without armrests; sleeps in a tall full size bed maybe 26"  Lives With: Spouse;Daughter Prior Function Level of Independence: Independent with basic ADLs;Independent with homemaking with ambulation;Independent with gait;Independent with  transfers;Requires assistive device for independence  Able to Take Stairs?: Yes Driving: Yes Vocation: Other (Comment) (takes care of her daughter) Vision/Perception  Vision - History Ability to See in Adequate  Light: 1 Impaired Perception Perception: Within Functional Limits Praxis Praxis: WFL  Cognition Overall Cognitive Status: Within Functional Limits for tasks assessed Arousal/Alertness: Awake/alert Orientation Level: Oriented X4 Year: 2025 Month: April Day of Week: Correct Memory: Appears intact Awareness: Appears intact Problem Solving: Appears intact Safety/Judgment: Appears intact Sensation Sensation Light Touch: Impaired Detail Peripheral sensation comments: L LE stocking numbness foot to knee and some diminished on thigh compared to R Light Touch Impaired Details: Impaired LLE Coordination Fine Motor Movements are Fluid and Coordinated: Yes Coordination and Movement Description: Deficits due to LLE weakness/numbness and decreased balance strategies. Motor  Motor Motor: Other (comment) Motor - Skilled Clinical Observations: Deficits due to LLE weakness/numbness and decreased balance strategies.   Trunk/Postural Assessment  Cervical Assessment Cervical Assessment: Within Functional Limits Thoracic Assessment Thoracic Assessment: Within Functional Limits Lumbar Assessment Lumbar Assessment: Exceptions to [facility] Lumbar AROM Overall Lumbar AROM: Deficits;Due to precautions Overall Lumbar AROM Comments: limited by pain and precautions Postural Control Postural Control: Deficits on evaluation Righting Reactions: Decreased due to pain; reliant on UE's Protective Responses: Decreased due to pain, reliant on UE's  Balance Balance Balance Assessed: Yes Standardized Balance Assessment Standardized Balance Assessment: (P)  (5x sit to stand with min/CGA 43.85 sec) Static Sitting Balance Static Sitting - Balance Support: Feet supported Static Sitting - Level of  Assistance: 5: Stand by assistance Static Sitting - Comment/# of Minutes: UE support at times Dynamic Sitting Balance Dynamic Sitting - Balance Support: During functional activity Dynamic Sitting - Level of Assistance: 5: Stand by assistance Dynamic Sitting - Balance Activities: Forward lean/weight shifting Sitting balance - Comments: for sit to stand Static Standing Balance Static Standing - Balance Support: Bilateral upper extremity supported Static Standing - Level of Assistance: 4: Min assist Static Standing - Comment/# of Minutes: 30 sec during hygiene after toileting Dynamic Standing Balance Dynamic Standing - Level of Assistance: 4: Min assist Extremity Assessment      RLE Assessment RLE Assessment: Exceptions to [facility] Active Range of Motion (AROM) Comments: generally WFL, tight hamstrings, decreased ankle DF General Strength Comments: Hip flexion 4-/5, knee extension 4/5, ankle DF 4-/5 LLE Assessment LLE Assessment: Exceptions to [facility] Active Range of Motion (AROM) Comments: decreased ankle DF by about 50%, tight hamstrings General Strength Comments: Hip flexion 3/4, knee extension 4-/5, ankle DF 2+/5  Care Tool Care Tool Bed Mobility Roll left and right activity   Roll left and right assist level: Supervision/Verbal cueing    Sit to lying activity   Sit to lying assist level: Supervision/Verbal cueing    Lying to sitting on side of bed activity   Lying to sitting on side of bed assist level: the ability to move from lying on the back to sitting on the side of the bed with no back support.: Contact Guard/Touching assist     Care Tool Transfers Sit to stand transfer   Sit to stand assist level: Minimal Assistance - Patient > 75%    Chair/bed transfer   Chair/bed transfer assist level: Minimal Assistance - Patient > 75%    Car transfer   Car transfer assist level: Minimal Assistance - Patient > 75%      Care Tool Locomotion Ambulation   Assist level: Minimal  Assistance - Patient > 75% Assistive device: Walker-rolling Max distance: 17'  Walk 10 feet activity   Assist level: Minimal Assistance - Patient > 75% Assistive device: Walker-rolling   Walk 50 feet with 2 turns activity Walk 50 feet with 2 turns activity did not  occur: Safety/medical concerns      Walk 150 feet activity Walk 150 feet activity did not occur: Safety/medical concerns      Walk 10 feet on uneven surfaces activity Walk 10 feet on uneven surfaces activity did not occur: Safety/medical concerns      Stairs   Assist level: Moderate Assistance - Patient - 50 - 74% Stairs assistive device: 2 hand rails Max number of stairs: 2  Walk up/down 1 step activity   Walk up/down 1 step (curb) assist level: Moderate Assistance - Patient - 50 - 74% Walk up/down 1 step or curb assistive device: 2 hand rails  Walk up/down 4 steps activity Walk up/down 4 steps activity did not occur: Safety/medical concerns      Walk up/down 12 steps activity Walk up/down 12 steps activity did not occur: Safety/medical concerns      Pick up small objects from floor Pick up small object from the floor (from standing position) activity did not occur: Safety/medical concerns      Wheelchair Is the patient using a wheelchair?: Yes Type of Wheelchair: Manual   Wheelchair assist level: Supervision/Verbal cueing Max wheelchair distance: 100'  Wheel 50 feet with 2 turns activity   Assist Level: Supervision/Verbal cueing  Wheel 150 feet activity Wheelchair 150 feet activity did not occur: Safety/medical concerns      Refer to Care Plan for Long Term Goals  SHORT TERM GOAL WEEK 1 PT Short Term Goal 1 (Week 1): STG=LTG due to ELOS  Recommendations for other services: None   Skilled Therapeutic Intervention Patient on [facility] upon PT entry and reported had not had a good BM since Sunday.  Assisted once finished with hygiene in standing with pt needing min a sit to stand and total A for toilet hygiene  and min A for clothing management.  Patient step pivot to recliner with RW and cues min A.  Ambulated x 17' with RW and min A for balance and wheelchair follow.  Patient related fatigue and needing to sit.  Assisted in wheelchair to therapy gym.  Patient negotiated 2  3" steps with rails and mod A due to L knee buckling and cues for safety with turns on steps.  In parallel bars for 4" curb step with min A.  Performed 5 x sit to stand with CGA and UE support in 43.85 sec. Patient propelled w/c x 100' to ortho gym.  Performed car transfer to simulated [place] height with min A and cues for hand placement.  Patient declined ramp reported needing to toilet.  Assisted in wheelchair to room and step pivot to [facility] with min A using RW and A for clothing.  Patient needing assist for clothing and hygiene as previous.  Sit to sidelying on R with S and rail HOB approx 10-15 degrees.  Patient left supine with spouse in the room and needs in reach, bed alarm active.  Mobility Bed Mobility Bed Mobility: Sit to Sidelying Right Sit to Sidelying Right: Supervision/Verbal cueing Transfers Transfers: Sit to Stand;Stand to Sit;Stand Pivot Transfers Sit to Stand: Minimal Assistance - Patient > 75% Stand to Sit: Minimal Assistance - Patient > 75% Stand Pivot Transfers: Minimal Assistance - Patient > 75% Stand Pivot Transfer Details: Verbal cues for technique Stand Pivot Transfer Details (indicate cue type and reason): cues for walker proximity, hand placement Transfer (Assistive device): Rolling walker Locomotion  Gait Ambulation: Yes Gait Assistance: Minimal Assistance - Patient > 75% Gait Distance (Feet): 17 Feet Assistive device: Rolling walker Gait  Gait: Yes Gait Pattern: Impaired Gait Pattern: Decreased step length - right;Step-to pattern;Decreased stance time - left;Lateral trunk lean to left Stairs / Additional Locomotion Stairs: Yes Stairs Assistance: Moderate Assistance - Patient 50 - 74% Stair Management  Technique: Two rails;Step to pattern;Forwards Number of Stairs: 2 Height of Stairs: 4 Curb: Minimal Assistance - Patient >75% (in parallel bars) Wheelchair Mobility Wheelchair Mobility: Yes Wheelchair Assistance: Doctor, general practice: Both upper extremities Wheelchair Parts Management: Needs assistance Distance: 100'   Discharge Criteria: Patient will be discharged from PT if patient refuses treatment 3 consecutive times without medical reason, if treatment goals not met, if there is a change in medical status, if patient makes no progress towards goals or if patient is discharged from hospital.  The above assessment, treatment plan, treatment alternatives and goals were discussed and mutually agreed upon: by patient and by family  Elray Mcgregor 02/02/2024, 4:12 PM  Sheran Lawless, PT

## 2024-02-02 NOTE — Plan of Care (Signed)
  Problem: RH Balance Goal: LTG Patient will maintain dynamic standing with ADLs (OT) Description: LTG:  Patient will maintain dynamic standing balance with assist during activities of daily living (OT)  Flowsheets (Taken 02/02/2024 1226) LTG: Pt will maintain dynamic standing balance during ADLs with: Independent with assistive device   Problem: RH Bathing Goal: LTG Patient will bathe all body parts with assist levels (OT) Description: LTG: Patient will bathe all body parts with assist levels (OT) Flowsheets (Taken 02/02/2024 1226) LTG: Pt will perform bathing with assistance level/cueing: Independent with assistive device    Problem: RH Dressing Goal: LTG Patient will perform upper body dressing (OT) Description: LTG Patient will perform upper body dressing with assist, with/without cues (OT). Flowsheets (Taken 02/02/2024 1226) LTG: Pt will perform upper body dressing with assistance level of:  Independent with assistive device  Including orthosis Goal: LTG Patient will perform lower body dressing w/assist (OT) Description: LTG: Patient will perform lower body dressing with assist, with/without cues in positioning using equipment (OT) Flowsheets (Taken 02/02/2024 1226) LTG: Pt will perform lower body dressing with assistance level of: Independent with assistive device   Problem: RH Toileting Goal: LTG Patient will perform toileting task (3/3 steps) with assistance level (OT) Description: LTG: Patient will perform toileting task (3/3 steps) with assistance level (OT)  Flowsheets (Taken 02/02/2024 1226) LTG: Pt will perform toileting task (3/3 steps) with assistance level: Independent with assistive device   Problem: RH Simple Meal Prep Goal: LTG Patient will perform simple meal prep w/assist (OT) Description: LTG: Patient will perform simple meal prep with assistance, with/without cues (OT). Flowsheets (Taken 02/02/2024 1226) LTG: Pt will perform simple meal prep with assistance level of:  Independent with assistive device   Problem: RH Light Housekeeping Goal: LTG Patient will perform light housekeeping w/assist (OT) Description: LTG: Patient will perform light housekeeping with assistance, with/without cues (OT). Flowsheets (Taken 02/02/2024 1226) LTG: Pt will perform light housekeeping with assistance level of: Independent with assistive device   Problem: RH Toilet Transfers Goal: LTG Patient will perform toilet transfers w/assist (OT) Description: LTG: Patient will perform toilet transfers with assist, with/without cues using equipment (OT) Flowsheets (Taken 02/02/2024 1226) LTG: Pt will perform toilet transfers with assistance level of: Independent with assistive device   Problem: RH Tub/Shower Transfers Goal: LTG Patient will perform tub/shower transfers w/assist (OT) Description: LTG: Patient will perform tub/shower transfers with assist, with/without cues using equipment (OT) Flowsheets (Taken 02/02/2024 1226) LTG: Pt will perform tub/shower stall transfers with assistance level of: Supervision/Verbal cueing

## 2024-02-02 NOTE — Evaluation (Signed)
 Occupational Therapy Assessment and Plan  Patient Details  Name: Stacy Frost MRN: [id number] Date of Birth: 12/16/1960  OT Diagnosis: acute pain, lumbago (low back pain), muscle weakness (generalized), and decreased activity tolerance Rehab Potential: Rehab Potential (ACUTE ONLY): Good ELOS: 10-12 days   Today's Date: 02/02/2024 OT Individual Time: 4098-1191 OT Individual Time Calculation (min): 57 min     Hospital Problem: Principal Problem:   Lumbar radiculopathy   Past Medical History:  Past Medical History:  Diagnosis Date   Anxiety    Aortic stenosis    Mild to moderate   Bicuspid aortic valve    Essential hypertension, benign    GERD (gastroesophageal reflux disease)    Heart murmur    S/P Aortic Valve Replacement July 2024   History of cardiac catheterization    Normal coronaries 2008   Hyperlipidemia    Migraine headache    MVP (mitral valve prolapse)    Obesity    Pain in joint, ankle and foot    Chronic   Palpitations    Documented PACs by previous monitoring   Restless leg syndrome    Skin tag    Type 2 diabetes mellitus (HCC)    Unilateral primary osteoarthritis, right knee 09/06/2016   Past Surgical History:  Past Surgical History:  Procedure Laterality Date   ACHILLES TENDON REPAIR  2003   Left   AORTIC VALVE REPLACEMENT N/A 05/14/2023   Procedure: AORTIC VALVE REPLACEMENT (AVR);  Surgeon: Corliss Skains, MD;  Location: [facility] OR;  Service: Open Heart Surgery;  Laterality: N/A;   CARDIAC CATHETERIZATION  2008   Normal coronary arteries; normal LV systolic function; mild dilation of aortic root; mild dilation of proximal ascending aorta; likely bicuspid aortic valve   CHOLECYSTECTOMY  2001   "Poor EF at 17%"   COLONOSCOPY N/A 07/17/2014   Procedure: COLONOSCOPY;  Surgeon: Malissa Hippo, MD;  Location: [facility];  Service: Endoscopy;  Laterality: N/A;  105   ESOPHAGEAL DILATION N/A 06/11/2015   Procedure: ESOPHAGEAL DILATION;  Surgeon: Malissa Hippo, MD;  Location: [facility] ORS;  Service: Endoscopy;  Laterality: N/AElease Hashimoto 54/56/58   ESOPHAGOGASTRODUODENOSCOPY N/A 07/17/2014   Procedure: ESOPHAGOGASTRODUODENOSCOPY (EGD);  Surgeon: Malissa Hippo, MD;  Location: [facility];  Service: Endoscopy;  Laterality: N/A;   ESOPHAGOGASTRODUODENOSCOPY (EGD) WITH PROPOFOL N/A 06/11/2015   Procedure: ESOPHAGOGASTRODUODENOSCOPY (EGD) WITH PROPOFOL;  Surgeon: Malissa Hippo, MD;  Location: [facility] ORS;  Service: Endoscopy;  Laterality: N/A;  730   KNEE ARTHROSCOPY WITH MEDIAL MENISECTOMY Right 07/02/2018   Procedure: RIGHT KNEE ARTHROSCOPY, DEBRIDEMENT, MEDIAL MENISECTOMY;  Surgeon: [name], MD;  Location: [facility] OR;  Service: Orthopedics;  Laterality: Right;   KNEE SURGERY     MALONEY DILATION N/A 07/17/2014   Procedure: MALONEY DILATION;  Surgeon: Malissa Hippo, MD;  Location: [facility];  Service: Endoscopy;  Laterality: N/A;   RIGHT/LEFT HEART CATH AND CORONARY ANGIOGRAPHY N/A 05/02/2023   Procedure: RIGHT/LEFT HEART CATH AND CORONARY ANGIOGRAPHY;  Surgeon: [place], Peter M, MD;  Location: [facility] INVASIVE CV LAB;  Service: Cardiovascular;  Laterality: N/A;   TEE WITHOUT CARDIOVERSION N/A 05/14/2023   Procedure: TRANSESOPHAGEAL ECHOCARDIOGRAM;  Surgeon: Corliss Skains, MD;  Location: [facility] OR;  Service: Open Heart Surgery;  Laterality: N/A;   UMBILICAL HERNIA REPAIR  2003    Assessment & Plan Clinical Impression: Patient is a 63 year old right-handed female with history significant for diabetes mellitus with peripheral neuropathy, anxiety, aortic stenosis/MVP status post aortic valve replacement  July 2024 per Dr. Cliffton Asters, hypertension, chronic anemia, hyperlipidemia, migraine headaches, restless leg syndrome. Per chart review patient lives with spouse. 1 level home one-step to entry. Used a four-wheel rolling walker for mobility and was able to perform her own ADLs. Spouse just retired and can provide assistance as needed. Presented  01/28/2024 with increasing low back and bilateral buttock/leg pain consistent with neurogenic claudication. MRI revealed multilevel spondylolisthesis and stenosis compressing the L2, L3, L4, and L5 nerve roots. Underwent elective bilateral L2-3, L3-4 and L4-5 laminotomy/foraminotomies/medial facetectomies to decompress the L2-L5 nerve root as well as PLIF L2-L5 by Dr. Tressie Stalker 01/28/2024. Postoperatively lumbar corset applied in sitting position when out of bed. Pain management with the use of scheduled Neurontin with Flexeril and oxycodone as needed. Foley catheter tube removed for 12/20/2023 as well as Hemovac drain. She did spike a fever postoperatively on 01/31/2024 and did receive Tylenol.. Follow-up labs performed showing a WBC of 14,400 as well as hemoglobin 7.3 from a preop of 13.2, sodium 134. Urinalysis negative nitrite and respiratory panel negative,CxR negative. Blood cultures pending. She was transfused one unit PRBC 4/4. Patient transferred to CIR on 02/01/2024.  Patient currently requires Mod A with basic self-care skills secondary to muscle weakness, decreased cardiorespiratoy endurance, unbalanced muscle activation and decreased coordination, decreased sensation in LLE, and decreased standing balance, decreased balance strategies, and difficulty maintaining precautions.  Prior to hospitalization, patient could complete BADLs and functional mobility with Mod I.   Patient will benefit from skilled intervention to increase independence with basic self-care skills and increase level of independence with iADL prior to discharge home with care partner.  Anticipate patient will require intermittent supervision and follow up outpatient vs no OT follow-up.  OT - End of Session Activity Tolerance: Tolerates 10 - 20 min activity with multiple rests Endurance Deficit: Yes OT Assessment Rehab Potential (ACUTE ONLY): Good OT Barriers to Discharge: Wound Care OT Patient demonstrates impairments in the  following area(s): Balance;Endurance;Motor;Pain;Perception;Safety OT Basic ADL's Functional Problem(s): Bathing;Dressing;Toileting OT Transfers Functional Problem(s): Toilet;Tub/Shower OT Plan OT Intensity: Minimum of 1-2 x/day, 45 to 90 minutes OT Frequency: 5 out of 7 days OT Duration/Estimated Length of Stay: 7-10 days OT Treatment/Interventions: Balance/vestibular training;Cognitive remediation/compensation;Community reintegration;Discharge planning;Disease mangement/prevention;DME/adaptive equipment instruction;Functional electrical stimulation;Functional mobility training;Neuromuscular re-education;Pain management;Patient/family education;Psychosocial support;Self Care/advanced ADL retraining;Skin care/wound managment;Splinting/orthotics;Therapeutic Activities;Therapeutic Exercise;UE/LE Strength taining/ROM;UE/LE Coordination activities;Visual/perceptual remediation/compensation OT Basic Self-Care Anticipated Outcome(s): Mod I OT Toileting Anticipated Outcome(s): Mod I OT Bathroom Transfers Anticipated Outcome(s): Mod I OT Recommendation Patient destination: Home Follow Up Recommendations: Outpatient OT Equipment Recommended: To be determined  OT Evaluation Precautions/Restrictions  Precautions Precautions: Back;Fall Recall of Precautions/Restrictions: Impaired (Unable to recall "lift") Precaution/Restrictions Comments: Verbally reviewed precautions, re-education on acronym BLT. Required Braces or Orthoses: Spinal Brace Spinal Brace: Lumbar corset;Applied in sitting position Restrictions Weight Bearing Restrictions Per Provider Order: No General Chart Reviewed: Yes Family/Caregiver Present: No Pain Pain Assessment Pain Scale: 0-10 Pain Score: 5  Pain Type: Surgical pain Pain Location: Back Pain Orientation: Mid;Lower Pain Descriptors / Indicators: Radiating Pain Intervention(s): Rest;Medication (See eMAR) Home Living/Prior Functioning Home Living Family/patient expects  to be discharged to:: Private residence Living Arrangements: Spouse/significant other Available Help at Discharge: Family, Available 24 hours/day (Spouse recently retired.) Type of Home: House Home Access: Stairs to enter Secretary/administrator of Steps: 2 STE Entrance Stairs-Rails: None Home Layout: One level Bathroom Shower/Tub: Tub/shower unit Public relations account executive, no grab bars, and detachable shower head.) Bathroom Toilet: Pharmacist, community: Yes  Lives With: Spouse, Daughter IADL History Homemaking  Responsibilities: Yes Meal Prep Responsibility: Secondary Laundry Responsibility: Secondary Child Care Responsibility: Secondary (Adult daughter who has had multiple strokes.) Occupation: Retired Prior Function Level of Independence: Independent with basic ADLs, Independent with homemaking with ambulation, Independent with gait, Independent with transfers, Requires assistive device for independence (With use of rollator) Vision Baseline Vision/History: 1 Wears glasses Ability to See in Adequate Light: 1 Impaired Patient Visual Report: No change from baseline Perception  Perception: Within Functional Limits Praxis Praxis: WFL Cognition Cognition Overall Cognitive Status: Within Functional Limits for tasks assessed Orientation Level: Person;Situation;Place Memory: Appears intact Awareness: Appears intact Problem Solving: Appears intact Safety/Judgment: Appears intact Brief Interview for Mental Status (BIMS) Repetition of Three Words (First Attempt): 3 Temporal Orientation: Year: Correct Temporal Orientation: Month: Accurate within 5 days Temporal Orientation: Day: Correct Recall: "Sock": Yes, no cue required Recall: "Blue": Yes, no cue required Recall: "Bed": Yes, after cueing ("a piece of furniture") BIMS Summary Score: 14 Sensation Sensation Light Touch: Impaired Detail Peripheral sensation comments: Reports numbness in LLE. Light Touch Impaired Details: Impaired  LLE Hot/Cold: Appears Intact Proprioception: Impaired by gross assessment Stereognosis: Appears Intact Coordination Gross Motor Movements are Fluid and Coordinated: No Fine Motor Movements are Fluid and Coordinated: Yes Coordination and Movement Description: Deficits due to LLE weakness/numbness and decreased balance strategies. Motor  Motor Motor: Other (comment) Motor - Skilled Clinical Observations: Deficits due to LLE weakness/numbness and decreased balance strategies.  Trunk/Postural Assessment  Cervical Assessment Cervical Assessment: Within Functional Limits Thoracic Assessment Thoracic Assessment: Within Functional Limits Lumbar Assessment Lumbar Assessment: Exceptions to [facility] (Limited by surgical site.) Postural Control Postural Control: Deficits on evaluation Righting Reactions: Decreased Protective Responses: Decreased  Balance Balance Balance Assessed: Yes Static Sitting Balance Static Sitting - Balance Support: Feet supported (Intermeidate BUE support for pain management.) Static Sitting - Level of Assistance: 5: Stand by assistance (SUP) Dynamic Sitting Balance Dynamic Sitting - Balance Support: During functional activity Dynamic Sitting - Level of Assistance: 5: Stand by assistance (SUP-CGA) Dynamic Sitting - Balance Activities: Reaching for objects;Lateral lean/weight shifting Static Standing Balance Static Standing - Balance Support: Bilateral upper extremity supported;During functional activity Static Standing - Level of Assistance: 5: Stand by assistance;4: Min assist (CGA-Min A) Dynamic Standing Balance Dynamic Standing - Balance Support: During functional activity Dynamic Standing - Level of Assistance: 4: Min assist Dynamic Standing - Balance Activities: Lateral lean/weight shifting;Forward lean/weight shifting Extremity/Trunk Assessment RUE Assessment RUE Assessment: Exceptions to [facility] Active Range of Motion (AROM) Comments: WFL General Strength  Comments: 4/5 LUE Assessment LUE Assessment: Exceptions to [facility] Active Range of Motion (AROM) Comments: WFL General Strength Comments: 4/5  Care Tool Care Tool Self Care Eating   Eating Assist Level: Set up assist    Oral Care    Oral Care Assist Level: Set up assist    Bathing   Body parts bathed by patient: Right arm;Left arm;Chest;Abdomen;Right upper leg;Face;Left upper leg Body parts bathed by helper: Right lower leg;Left lower leg;Front perineal area;Buttocks   Assist Level: Moderate Assistance - Patient 50 - 74%    Upper Body Dressing(including orthotics)   What is the patient wearing?: Pull over shirt   Assist Level: Supervision/Verbal cueing    Lower Body Dressing (excluding footwear)   What is the patient wearing?: Pants;Underwear/pull up Assist for lower body dressing: Moderate Assistance - Patient 50 - 74%    Putting on/Taking off footwear   What is the patient wearing?: Non-skid slipper socks Assist for footwear: Maximal Assistance - Patient 25 - 49%  Care Tool Toileting Toileting activity   Assist for toileting: Moderate Assistance - Patient 50 - 74%     Care Tool Bed Mobility Roll left and right activity        Sit to lying activity        Lying to sitting on side of bed activity         Care Tool Transfers Sit to stand transfer   Sit to stand assist level: Minimal Assistance - Patient > 75%    Chair/bed transfer   Chair/bed transfer assist level: Minimal Assistance - Patient > 75%     Toilet transfer   Assist Level: Minimal Assistance - Patient > 75%     Care Tool Cognition  Expression of Ideas and Wants Expression of Ideas and Wants: 4. Without difficulty (complex and basic) - expresses complex messages without difficulty and with speech that is clear and easy to understand  Understanding Verbal and Non-Verbal Content Understanding Verbal and Non-Verbal Content: 4. Understands (complex and basic) - clear comprehension without cues or  repetitions   Memory/Recall Ability Memory/Recall Ability : Current season;That he or she is in a hospital/hospital unit   Refer to Care Plan for Long Term Goals  SHORT TERM GOAL WEEK 1 OT Short Term Goal 1 (Week 1): STGs=LTGs due to patient's estimated length of stay.  Recommendations for other services: None    Skilled Therapeutic Intervention Session began with introduction to OT role, OT POC, and general orientation to rehab unit/schedule. Pt completes supine>sit with Min-Mod A and re-education on log-roll technique (no use of bed rail). Pt tolerates sitting EOB for >5 mins during medication administration at supervision level, intermediate use of BUE support for pain management. Sit<>stands and ambulating toilet-transfer with levels of assistance noted below, no overt sign of L-knee buckling, although decreased coordination/weight-bearing noted. Pt educated on use of reacher to thread undergarments, completing with Min A due to newness of technique, standing hike with CGA + RW, cued for unilateral support to manage decreased balance strategies. Pt remained sitting in recliner with all immediate needs met.   ADL ADL Eating: Set up Where Assessed-Eating: Bed level Grooming: Setup Where Assessed-Grooming: Chair Upper Body Bathing: Setup Where Assessed-Upper Body Bathing: Chair Lower Body Bathing: Maximal assistance Where Assessed-Lower Body Bathing: Chair Upper Body Dressing: Setup Where Assessed-Upper Body Dressing: Chair Lower Body Dressing: Minimal assistance (With use of reacher) Where Assessed-Lower Body Dressing: Chair Toileting: Moderate assistance Where Assessed-Toileting: Toilet;Bedside Commode Toilet Transfer: Contact guard;Minimal assistance Toilet Transfer Method: Proofreader: Bedside commode;Grab bars Tub/Shower Transfer: Unable to assess Film/video editor Method: Unable to assess Mobility  Bed Mobility Bed Mobility: Supine to  Sit Supine to Sit: Minimal Assistance - Patient > 75% Transfers Sit to Stand: Minimal Assistance - Patient > 75% Stand to Sit: Minimal Assistance - Patient > 75%   Discharge Criteria: Patient will be discharged from OT if patient refuses treatment 3 consecutive times without medical reason, if treatment goals not met, if there is a change in medical status, if patient makes no progress towards goals or if patient is discharged from hospital.  The above assessment, treatment plan, treatment alternatives and goals were discussed and mutually agreed upon: by patient  Lou Cal, OTR/L, MSOT  02/02/2024, 9:03 AM

## 2024-02-03 ENCOUNTER — Inpatient Hospital Stay (HOSPITAL_COMMUNITY)

## 2024-02-03 DIAGNOSIS — R739 Hyperglycemia, unspecified: Secondary | ICD-10-CM | POA: Diagnosis not present

## 2024-02-03 DIAGNOSIS — M7989 Other specified soft tissue disorders: Secondary | ICD-10-CM | POA: Diagnosis not present

## 2024-02-03 DIAGNOSIS — M5416 Radiculopathy, lumbar region: Secondary | ICD-10-CM | POA: Diagnosis not present

## 2024-02-03 DIAGNOSIS — I10 Essential (primary) hypertension: Secondary | ICD-10-CM | POA: Diagnosis not present

## 2024-02-03 LAB — GLUCOSE, CAPILLARY
Glucose-Capillary: 207 mg/dL — ABNORMAL HIGH (ref 70–99)
Glucose-Capillary: 258 mg/dL — ABNORMAL HIGH (ref 70–99)
Glucose-Capillary: 199 mg/dL — ABNORMAL HIGH (ref 70–99)
Glucose-Capillary: 253 mg/dL — ABNORMAL HIGH (ref 70–99)

## 2024-02-03 MED ORDER — INSULIN ASPART 100 UNIT/ML IJ SOLN
0.0000 [IU] | Freq: Every day | INTRAMUSCULAR | Status: DC
  Administered 2024-02-03: 3 [IU] via SUBCUTANEOUS
  Administered 2024-02-04: 2 [IU] via SUBCUTANEOUS
  Administered 2024-02-06: 5 [IU] via SUBCUTANEOUS
  Administered 2024-02-07: 4 [IU] via SUBCUTANEOUS
  Administered 2024-02-08: 3 [IU] via SUBCUTANEOUS
  Administered 2024-02-09 – 2024-02-11 (×3): 4 [IU] via SUBCUTANEOUS
  Administered 2024-02-12: 2 [IU] via SUBCUTANEOUS

## 2024-02-03 NOTE — Plan of Care (Signed)
  Problem: SCI BOWEL ELIMINATION Goal: RH STG MANAGE BOWEL WITH ASSISTANCE Description: STG Manage Bowel with mod I Assistance. Outcome: Progressing   Problem: SCI BLADDER ELIMINATION Goal: RH STG MANAGE BLADDER WITH ASSISTANCE Description: STG Manage Bladder With Mod I  Assistance Outcome: Progressing   Problem: RH SKIN INTEGRITY Goal: RH STG SKIN FREE OF INFECTION/BREAKDOWN Description: Manage  skin free of infection/breakdown with mod I assistance Outcome: Progressing   Problem: RH SAFETY Goal: RH STG ADHERE TO SAFETY PRECAUTIONS W/ASSISTANCE/DEVICE Description: STG Adhere to Safety Precautions With  mod I Assistance/Device. Outcome: Progressing   Problem: RH PAIN MANAGEMENT Goal: RH STG PAIN MANAGED AT OR BELOW PT'S PAIN GOAL Description: <4 w/ prns Outcome: Progressing

## 2024-02-03 NOTE — Progress Notes (Signed)
 PROGRESS NOTE   Subjective/Complaints:  Pt doing well today, slept well. Pain well managed. LBM yesterday. Urinating fine. Denies any other complaints or concerns.    ROS: as per HPI. Denies CP, SOB, abd pain, N/V/D/C, or any other complaints at this time.     Objective:   VAS [place] LOWER EXTREMITY VENOUS (DVT) Result Date: 02/03/2024  Lower Venous DVT Study Patient Name:  Stacy Frost  Date of Exam:   02/03/2024 Medical Rec #: [id number]   Accession #:    [id number] Date of Birth: 1960/12/21   Patient Gender: F Patient Age:   63 years Exam Location:  [facility] Procedure:      VAS [place] LOWER EXTREMITY VENOUS (DVT) Referring Phys: Mariam Dollar --------------------------------------------------------------------------------  Indications: Edema. Other Indications: Rehab patient. Comparison Study: Previous exam (reflux study) on 04/12/2020 was negative for                   DVT Performing Technologist: Ernestene Mention RVT, RDMS  Examination Guidelines: A complete evaluation includes B-mode imaging, spectral Doppler, color Doppler, and power Doppler as needed of all accessible portions of each vessel. Bilateral testing is considered an integral part of a complete examination. Limited examinations for reoccurring indications may be performed as noted. The reflux portion of the exam is performed with the patient in reverse Trendelenburg.  +---------+---------------+---------+-----------+----------+--------------+ RIGHT    CompressibilityPhasicitySpontaneityPropertiesThrombus Aging +---------+---------------+---------+-----------+----------+--------------+ CFV      Full           Yes      Yes                                 +---------+---------------+---------+-----------+----------+--------------+ SFJ      Full                                                         +---------+---------------+---------+-----------+----------+--------------+ FV Prox  Full           Yes      Yes                                 +---------+---------------+---------+-----------+----------+--------------+ FV Mid   Full           Yes      Yes                                 +---------+---------------+---------+-----------+----------+--------------+ FV DistalFull           Yes      Yes                                 +---------+---------------+---------+-----------+----------+--------------+ PFV      Full                                                        +---------+---------------+---------+-----------+----------+--------------+  POP      Full           Yes      Yes                                 +---------+---------------+---------+-----------+----------+--------------+ PTV      Full                                                        +---------+---------------+---------+-----------+----------+--------------+ PERO     Full                                                        +---------+---------------+---------+-----------+----------+--------------+   +---------+---------------+---------+-----------+----------+--------------+ LEFT     CompressibilityPhasicitySpontaneityPropertiesThrombus Aging +---------+---------------+---------+-----------+----------+--------------+ CFV      Full           Yes      Yes                                 +---------+---------------+---------+-----------+----------+--------------+ SFJ      Full                                                        +---------+---------------+---------+-----------+----------+--------------+ FV Prox  Full           Yes      Yes                                 +---------+---------------+---------+-----------+----------+--------------+ FV Mid   Full           Yes      Yes                                  +---------+---------------+---------+-----------+----------+--------------+ FV DistalFull           Yes      Yes                                 +---------+---------------+---------+-----------+----------+--------------+ PFV      Full                                                        +---------+---------------+---------+-----------+----------+--------------+ POP      Full           Yes      Yes                                 +---------+---------------+---------+-----------+----------+--------------+ PTV  Full                                                        +---------+---------------+---------+-----------+----------+--------------+ PERO     Full                                                        +---------+---------------+---------+-----------+----------+--------------+     Summary: BILATERAL: - No evidence of deep vein thrombosis seen in the lower extremities, bilaterally. -No evidence of popliteal cyst, bilaterally.   *See table(s) above for measurements and observations.    Preliminary     Recent Labs    01/31/24 1253 02/02/24 1149  WBC 14.4* 8.4  HGB 7.3* 9.1*  HCT 21.4* 26.7*  PLT 176 249   Recent Labs    01/31/24 1144 02/02/24 1149  NA 134* 133*  K 3.7 3.6  CL 98 94*  CO2 23 26  GLUCOSE 259* 179*  BUN 12 14  CREATININE 0.92 0.89  CALCIUM 8.6* 8.5*    Intake/Output Summary (Last 24 hours) at 02/03/2024 1015 Last data filed at 02/02/2024 1855 Gross per 24 hour  Intake 473 ml  Output --  Net 473 ml        Physical Exam: Vital Signs Blood pressure 122/60, pulse 81, temperature 98.9 F (37.2 C), temperature source Oral, resp. rate 18, height 5\' 6"  (1.676 m), weight 87.1 kg, SpO2 96%.   General: Alert and oriented x 3, No apparent distress HEENT: Head is normocephalic, atraumatic, PERRLA, EOMI, sclera anicteric, oral mucosa pink and moist, small abrasion to L chin, no crusting noted. No vesicles.   Neck: Supple without JVD  or lymphadenopathy Heart: Reg rate and rhythm. Valvular sounding murmur, no rubs/gallops Chest: CTA bilaterally without wheezes, rales, or rhonchi; no distress Abdomen: Soft, non-tender, non-distended, bowel sounds positive. Extremities: No clubbing, cyanosis, or edema. Pulses are 2+ Psych: Pt's affect is a little flat but pleasant  PRIOR EXAMS: Skin: Back incision Clean and intact , small abrasion on L chin Neuro:  Alert and oriented x 3. Normal insight and awareness. Intact Memory. Normal language and speech. Cranial nerve exam unremarkable. MMT: RUE 5/5. LUE 4+/5 prox to distal. RLE HF 2+/5 with pain inhibition, KE 3+, ADF/PF 4/5. LLE 2- to 2+/5 HF KE with pain component to weakness and 1+ to 2/5 ADF/PF. Decreased LT and PP bilateral LE's below knee. Intact saddle sensation. DTR's tr-1+. No abnl resting tone. No cerebellar signs. .  Musculoskeletal: Low back tender to palpation and attempts at trunk movement. Pt wearing LSO    Assessment/Plan: 1. Functional deficits which require 3+ hours per day of interdisciplinary therapy in a comprehensive inpatient rehab setting. Physiatrist is providing close team supervision and 24 hour management of active medical problems listed below. Physiatrist and rehab team continue to assess barriers to discharge/monitor patient progress toward functional and medical goals  Care Tool:  Bathing    Body parts bathed by patient: Right arm, Left arm, Chest, Abdomen, Right upper leg, Face, Left upper leg   Body parts bathed by helper: Right lower leg, Left lower leg, Front perineal area, Buttocks     Bathing assist Assist Level: Moderate  Assistance - Patient 50 - 74%     Upper Body Dressing/Undressing Upper body dressing   What is the patient wearing?: Pull over shirt    Upper body assist Assist Level: Supervision/Verbal cueing    Lower Body Dressing/Undressing Lower body dressing      What is the patient wearing?: Pants, Underwear/pull up      Lower body assist Assist for lower body dressing: Moderate Assistance - Patient 50 - 74%     Toileting Toileting    Toileting assist Assist for toileting: Moderate Assistance - Patient 50 - 74%     Transfers Chair/bed transfer  Transfers assist     Chair/bed transfer assist level: Minimal Assistance - Patient > 75%     Locomotion Ambulation   Ambulation assist      Assist level: Minimal Assistance - Patient > 75% Assistive device: Walker-rolling Max distance: 17'   Walk 10 feet activity   Assist     Assist level: Minimal Assistance - Patient > 75% Assistive device: Walker-rolling   Walk 50 feet activity   Assist Walk 50 feet with 2 turns activity did not occur: Safety/medical concerns         Walk 150 feet activity   Assist Walk 150 feet activity did not occur: Safety/medical concerns         Walk 10 feet on uneven surface  activity   Assist Walk 10 feet on uneven surfaces activity did not occur: Safety/medical concerns         Wheelchair     Assist Is the patient using a wheelchair?: Yes Type of Wheelchair: Manual    Wheelchair assist level: Supervision/Verbal cueing Max wheelchair distance: 100'    Wheelchair 50 feet with 2 turns activity    Assist        Assist Level: Supervision/Verbal cueing   Wheelchair 150 feet activity     Assist  Wheelchair 150 feet activity did not occur: Safety/medical concerns       Blood pressure 122/60, pulse 81, temperature 98.9 F (37.2 C), temperature source Oral, resp. rate 18, height 5\' 6"  (1.676 m), weight 87.1 kg, SpO2 96%.  Medical Problem List and Plan: 1. 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   -Back corset when out of bed             -patient may shower             -ELOS/Goals: 9-14 days  - Continue CIR, evaluations pending 2.   Antithrombotics: -DVT/anticoagulation:  Mechanical: Antiembolism stockings, thigh (TED hose) Bilateral lower extremities  -02/03/24 DVT US negative             -antiplatelet therapy: N/A 3. Pain Management: Neurontin 100 mg daily and 200 mg nightly, Flexeril and oxycodone as needed             -might benefit from scheduled oxycodone prior to therapies 4. Mood/Behavior/Sleep: Celexa 40 mg daily, Klonopin as needed             -antipsychotic agents: N/A -pt with anxiety about therapy tomorrow. Reassured her that team will work with her at her pace and be considerate of her pain. I also told her family could stay over with her if it helped her anxiety.  -02/03/24 per pharmacy yesterday, pt on trazodone 150mg  nightly-- but reported sleeping well so hold off on starting that for now.  5. Neuropsych/cognition: This patient is capable of making decisions on  her own behalf. 6. Skin/Wound Care: Routine skin checks 7. Fluids/Electrolytes/Nutrition: Routine in and outs, cont vitamins/supplements             -mild hyponatremia on recent labs- Na 133 on 02/02/24             -f/u labs Monday   8.  Acute blood loss anemia on chronic anemia:        -pt transfused 1u PRBC today for hgb of 7.3 -fe++ supp -02/03/24 Hgb 9.1 yesterday, f/up CBC tomorrow.   9.  History aortic stenosis/MVP.  Status post aortic valve replacement July 2024 per Dr. Cliffton Asters 10.  Hypertension.  Toprol-XL 25 mg twice daily, Lasix 40 mg daily.  Monitor with increased mobility             -controlled at present  -4/5-6 controlled, continue to monitor    Vitals:   02/01/24 2114 02/02/24 0543 02/02/24 1433 02/02/24 1930  BP: (!) 114/52 (!) 130/55 (!) 137/58 128/64   02/03/24 0502 02/03/24 0946  BP: (!) 119/54 122/60      11.  Hyperlipidemia.  Lipitor 80mg  daily 12.  Diabetes mellitus with peripheral neuropathy.  Latest hemoglobin A1c 9.7.                -sugars poorly controlled at present as well              -pt is on metformin XR  500mg  2 tablets daily as she takes at home             -SSI             -might need adjustments to her standing regimen  - 4/6 CBGs ok control but a little high in the mornings; will add HS SSI but ultimately might need adjustment to her metformin XR if her sugars are persistently elevated. Per pharmacy note yesterday, she was on insulin glargine 30U HS and mounjaro 7.5mg  weekly-- verify this is what she was taking, and potentially adding the glargine back will help CBG (last 3)  Recent Labs    02/02/24 1606 02/02/24 2056 02/03/24 0614  GLUCAP 194* 217* 207*    14.  Restless leg syndrome.  Mirapex 1 mg nightly 15.  Obesity.  BMI 30.99.  Dietary follow-up 16. Constipation: colace BID, miralax daily  - LBM 4/3, start MiraLAX daily  -02/03/24 LBM yesterday, cont regimen 17. Small abrasion ok chin  -bacitracin, she reports she is allergic to tape      LOS: 2 days A FACE TO FACE EVALUATION WAS PERFORMED  [address] 02/03/2024, 10:15 AM

## 2024-02-03 NOTE — Plan of Care (Signed)
  Problem: Consults Goal: RH SPINAL CORD INJURY PATIENT EDUCATION Description:  See Patient Education module for education specifics.  Outcome: Progressing   Problem: SCI BOWEL ELIMINATION Goal: RH STG MANAGE BOWEL WITH ASSISTANCE Description: STG Manage Bowel with mod I Assistance. Outcome: Progressing   Problem: SCI BLADDER ELIMINATION Goal: RH STG MANAGE BLADDER WITH ASSISTANCE Description: STG Manage Bladder With Mod I  Assistance Outcome: Progressing   Problem: RH SKIN INTEGRITY Goal: RH STG SKIN FREE OF INFECTION/BREAKDOWN Description: Manage  skin free of infection/breakdown with mod I assistance Outcome: Progressing   Problem: RH SAFETY Goal: RH STG ADHERE TO SAFETY PRECAUTIONS W/ASSISTANCE/DEVICE Description: STG Adhere to Safety Precautions With  mod I Assistance/Device. Outcome: Progressing   Problem: RH PAIN MANAGEMENT Goal: RH STG PAIN MANAGED AT OR BELOW PT'S PAIN GOAL Description: <4 w/ prns Outcome: Progressing Note: Patient starts to rock/rubbing legs cue pain increasing. Encouraged to endorse pain and levels to avoid pain level increasing anxiety.

## 2024-02-03 NOTE — Progress Notes (Signed)
 BLE venous duplex has been completed.   Results can be found under chart review under CV PROC. 02/03/2024 9:38 AM Hagen Tidd RVT, RDMS

## 2024-02-04 DIAGNOSIS — M5416 Radiculopathy, lumbar region: Secondary | ICD-10-CM | POA: Diagnosis not present

## 2024-02-04 LAB — GLUCOSE, CAPILLARY
Glucose-Capillary: 205 mg/dL — ABNORMAL HIGH (ref 70–99)
Glucose-Capillary: 175 mg/dL — ABNORMAL HIGH (ref 70–99)
Glucose-Capillary: 159 mg/dL — ABNORMAL HIGH (ref 70–99)
Glucose-Capillary: 240 mg/dL — ABNORMAL HIGH (ref 70–99)

## 2024-02-04 LAB — CBC WITH DIFFERENTIAL/PLATELET
Abs Immature Granulocytes: 0.09 10*3/uL — ABNORMAL HIGH (ref 0.00–0.07)
Basophils Absolute: 0 10*3/uL (ref 0.0–0.1)
Basophils Relative: 0 %
Eosinophils Absolute: 0.4 10*3/uL (ref 0.0–0.5)
Eosinophils Relative: 5 %
HCT: 27.8 % — ABNORMAL LOW (ref 36.0–46.0)
Hemoglobin: 9.3 g/dL — ABNORMAL LOW (ref 12.0–15.0)
Immature Granulocytes: 1 %
Lymphocytes Relative: 19 %
Lymphs Abs: 1.6 10*3/uL (ref 0.7–4.0)
MCH: 28.1 pg (ref 26.0–34.0)
MCHC: 33.5 g/dL (ref 30.0–36.0)
MCV: 84 fL (ref 80.0–100.0)
Monocytes Absolute: 0.5 10*3/uL (ref 0.1–1.0)
Monocytes Relative: 6 %
Neutro Abs: 5.9 10*3/uL (ref 1.7–7.7)
Neutrophils Relative %: 69 %
Platelets: 322 10*3/uL (ref 150–400)
RBC: 3.31 MIL/uL — ABNORMAL LOW (ref 3.87–5.11)
RDW: 13.2 % (ref 11.5–15.5)
WBC: 8.6 10*3/uL (ref 4.0–10.5)
nRBC: 0 % (ref 0.0–0.2)

## 2024-02-04 LAB — COMPREHENSIVE METABOLIC PANEL WITH GFR
ALT: 44 U/L (ref 0–44)
AST: 47 U/L — ABNORMAL HIGH (ref 15–41)
Albumin: 2.7 g/dL — ABNORMAL LOW (ref 3.5–5.0)
Alkaline Phosphatase: 60 U/L (ref 38–126)
Anion gap: 8 (ref 5–15)
BUN: 11 mg/dL (ref 8–23)
CO2: 29 mmol/L (ref 22–32)
Calcium: 9.1 mg/dL (ref 8.9–10.3)
Chloride: 96 mmol/L — ABNORMAL LOW (ref 98–111)
Creatinine, Ser: 0.9 mg/dL (ref 0.44–1.00)
GFR, Estimated: >60 mL/min (ref >60)
Glucose, Bld: 237 mg/dL — ABNORMAL HIGH (ref 70–99)
Potassium: 3.4 mmol/L — ABNORMAL LOW (ref 3.5–5.1)
Sodium: 133 mmol/L — ABNORMAL LOW (ref 135–145)
Total Bilirubin: 1 mg/dL (ref 0.0–1.2)
Total Protein: 6.7 g/dL (ref 6.5–8.1)

## 2024-02-04 MED ORDER — ASPIRIN 325 MG PO TABS
325.0000 mg | ORAL_TABLET | Freq: Every day | ORAL | Status: DC
  Administered 2024-02-04 – 2024-02-13 (×10): 325 mg via ORAL
  Filled 2024-02-04 (×10): qty 1

## 2024-02-04 MED ORDER — INSULIN GLARGINE-YFGN 100 UNIT/ML ~~LOC~~ SOLN
10.0000 [IU] | Freq: Every day | SUBCUTANEOUS | Status: DC
  Administered 2024-02-04 – 2024-02-05 (×2): 10 [IU] via SUBCUTANEOUS
  Filled 2024-02-04 (×3): qty 0.1

## 2024-02-04 MED ORDER — INSULIN GLARGINE 100 UNITS/ML SOLOSTAR PEN: 10.0000 [IU] | PEN_INJECTOR | Freq: Every day | SUBCUTANEOUS | Status: DC

## 2024-02-04 NOTE — IPOC Note (Signed)
 Overall Plan of Care [facility]) Patient Details Name: Stacy Frost MRN: [id number] DOB: [birth year]  Admitting Diagnosis: Lumbar radiculopathy  Hospital Problems: Principal Problem:   Lumbar radiculopathy     Functional Problem List: Nursing Endurance, Medication Management, Pain, Perception, Safety  PT Balance, Endurance, Motor, Sensory, Safety, Pain  OT Balance, Endurance, Motor, Pain, Perception, Safety  SLP    TR         Basic ADL's: OT Bathing, Dressing, Toileting     Advanced  ADL's: OT       Transfers: PT Bed Mobility, Car, Occupational psychologist, Research scientist (life sciences): PT Ambulation, Stairs     Additional Impairments: OT    SLP        TR      Anticipated Outcomes Item Anticipated Outcome  Self Feeding    Swallowing      Basic self-care  Mod I  Toileting  Mod I   Bathroom Transfers Mod I  Bowel/Bladder  continent of bowel/bladder  Transfers  mod I  Locomotion  mod I  Communication     Cognition     Pain  <4 w/ prns  Safety/Judgment  manage safety with mod I assistance   Therapy Plan: PT Intensity: Minimum of 1-2 x/day ,45 to 90 minutes PT Frequency: 5 out of 7 days PT Duration Estimated Length of Stay: 7-10 days OT Intensity: Minimum of 1-2 x/day, 45 to 90 minutes OT Frequency: 5 out of 7 days OT Duration/Estimated Length of Stay: 10-12 days     Team Interventions: Nursing Interventions Patient/Family Education, Medication Management, Disease Management/Prevention, Pain Management, Discharge Planning  PT interventions Ambulation/gait training, Balance/vestibular training, Disease management/prevention, Patient/family education, Neuromuscular re-education, Stair training, Functional mobility training, DME/adaptive equipment instruction, Discharge planning, Therapeutic Activities, UE/LE Strength taining/ROM, Therapeutic Exercise, Pain management  OT Interventions Balance/vestibular training, Cognitive remediation/compensation, Community  reintegration, Discharge planning, Disease mangement/prevention, DME/adaptive equipment instruction, Functional electrical stimulation, Functional mobility training, Neuromuscular re-education, Pain management, Patient/family education, Psychosocial support, Self Care/advanced ADL retraining, Skin care/wound managment, Splinting/orthotics, Therapeutic Activities, Therapeutic Exercise, UE/LE Strength taining/ROM, UE/LE Coordination activities, Visual/perceptual remediation/compensation  SLP Interventions    TR Interventions    SW/CM Interventions Discharge Planning, Psychosocial Support, Patient/Family Education   Barriers to Discharge MD  Medical stability, Home enviroment access/loayout, New diabetic, Wound care, Lack of/limited family support, Weight, and Weight bearing restrictions  Nursing Decreased caregiver support, Home environment access/layout Discharge: House  Discharge Home Layout: One level  Discharge Home Access: Stairs to enter  Entrance Stairs-Rails: None  Entrance Stairs-Number of Steps: 2  PT      OT Wound Care    SLP      SW       Team Discharge Planning: Destination: PT-Home ,OT- Home , SLP-  Projected Follow-up: PT-Home health PT, OT-  Outpatient OT, SLP-  Projected Equipment Needs: PT-To be determined, OT- To be determined, SLP-  Equipment Details: PT- , OT-  Patient/family involved in discharge planning: PT- Patient, Family member/caregiver,  OT-Patient, SLP-   MD ELOS: 10-12 days Medical Rehab Prognosis:  Good Assessment: The patient has been admitted for CIR therapies with the diagnosis of lumbar radiculopathy s/o lumbar fusion. The team will be addressing functional mobility, strength, stamina, balance, safety, adaptive techniques and equipment, self-care, bowel and bladder mgt, patient and caregiver education, . Goals have been set at mod I. Anticipated discharge destination is home with husband.        See Team Conference Notes for weekly updates to the  plan of care

## 2024-02-04 NOTE — Progress Notes (Addendum)
 Occupational Therapy Session Note  Patient Details  Name: Stacy Frost MRN: [id number] Date of Birth: [birth year]  Today's Date: 02/04/2024 OT Individual Time: 0800-0900 & 1430-1550 OT Individual Time Calculation (min): 60 min & 80 min   Short Term Goals: Week 1:  OT Short Term Goal 1 (Week 1): STGs=LTGs due to patient's estimated length of stay.  Skilled Therapeutic Interventions/Progress Updates:      Therapy Documentation Precautions:  Precautions Precautions: Back, Fall Recall of Precautions/Restrictions: Impaired (Unable to recall "lift") Precaution/Restrictions Comments: Verbally reviewed precautions, re-education on acronym BLT. Required Braces or Orthoses: Spinal Brace Spinal Brace: Lumbar corset, Applied in sitting position Restrictions Weight Bearing Restrictions Per Provider Order: No General: "Nice to meet you!" Pt seated in bed bed upon OT arrival, agreeable to OT session. Lumbar corset on during functional mobility.   Pain:  unrated pain reported in low back, activity, intermittent rest breaks, distractions provided for pain management, pt reports tolerable to proceed.    ADL:OT providing skilled intervention on ADL retraining in order to increase independence with tasks and increase activity tolerance. Pt completed the following tasks at the current level of assist: Bed mobility: SBA with increased time supine><EOB Grooming: seated on EOB with SBA to brush/dry hair and apply makeup UB dressing: SBA seated on TTB donning/doffing, total A for lumbar corset LB dressing: Min A, OT introduced reacher for increased independence, pt able to then complete at [facility] for stability in standing to manage over waist Footwear: total A for donning socks and shoes seated EOB Shower transfer: CGA with RW ambulating from bed with no LOB/SOB Bathing: SBA while sitting, able to complete lateral leans for peri hygiene  Other Treatments: OT providing therapeutic use of self in order to  build rapport and discuss patient current situation and goals for therapy.    Pt seated in W/C at end of session with W/C alarm donned, call light within reach and 4Ps assessed.    Session 2 General: "It was a great day!" Pt supine in bed upon OT arrival, agreeable to OT session. Lumbar corset on during functional mobility.   Pain:  6/10 pain reported in low back, activity, intermittent rest breaks, distractions provided for pain management, pt reports tolerable to proceed.   ADL: OT providing skilled intervention with functional mobility with pt ambulating from [employer] with RW and   Balance: Pt ambulated throughout therapy gym with RW at [facility] level to complete scavenger hunt to retrieve washcloths. Pt retrieved without VC, 2 items required VC to locate. Pt then able to fold washcloths in standing with no UE support at [facility]. Pt completed activity in order to increase dynamic standing balance and functional mobility.   Exercises: Pt completed the following exercise circuit in order to improve functional activity, strength and endurance to prepare for ADLs such as bathing. Pt completed the following exercises in seated/standing position with no noted LOB/SOB and various repetitions on each exercise: -sit to stands with no UE support, Mod A  -triceps extensions -shoulder flexion with 4# medicine ball   Pt seated in W/C at end of session with W/C alarm donned, call light within reach and 4Ps assessed. Husband present   Therapy/Group: Individual Therapy  Velia Meyer, OTD, OTR/L 02/04/2024, 4:33 PM

## 2024-02-04 NOTE — Progress Notes (Signed)
 Inpatient Rehabilitation Care Coordinator Assessment and Plan Patient Details  Name: Stacy Frost MRN: [id number] Date of Birth: 11/06/1960  Today's Date: 02/04/2024  Hospital Problems: Principal Problem:   Lumbar radiculopathy  Past Medical History:  Past Medical History:  Diagnosis Date   Anxiety    Aortic stenosis    Mild to moderate   Bicuspid aortic valve    Essential hypertension, benign    GERD (gastroesophageal reflux disease)    Heart murmur    S/P Aortic Valve Replacement July 2024   History of cardiac catheterization    Normal coronaries 2008   Hyperlipidemia    Migraine headache    MVP (mitral valve prolapse)    Obesity    Pain in joint, ankle and foot    Chronic   Palpitations    Documented PACs by previous monitoring   Restless leg syndrome    Skin tag    Type 2 diabetes mellitus (HCC)    Unilateral primary osteoarthritis, right knee 09/06/2016   Past Surgical History:  Past Surgical History:  Procedure Laterality Date   ACHILLES TENDON REPAIR  2003   Left   AORTIC VALVE REPLACEMENT N/A 05/14/2023   Procedure: AORTIC VALVE REPLACEMENT (AVR);  Surgeon: Corliss Skains, MD;  Location: [facility] OR;  Service: Open Heart Surgery;  Laterality: N/A;   CARDIAC CATHETERIZATION  2008   Normal coronary arteries; normal LV systolic function; mild dilation of aortic root; mild dilation of proximal ascending aorta; likely bicuspid aortic valve   CHOLECYSTECTOMY  2001   "Poor EF at 17%"   COLONOSCOPY N/A 07/17/2014   Procedure: COLONOSCOPY;  Surgeon: Malissa Hippo, MD;  Location: [facility] ENDO SUITE;  Service: Endoscopy;  Laterality: N/A;  105   ESOPHAGEAL DILATION N/A 06/11/2015   Procedure: ESOPHAGEAL DILATION;  Surgeon: Malissa Hippo, MD;  Location: [facility] ORS;  Service: Endoscopy;  Laterality: N/AElease Hashimoto 54/56/58   ESOPHAGOGASTRODUODENOSCOPY N/A 07/17/2014   Procedure: ESOPHAGOGASTRODUODENOSCOPY (EGD);  Surgeon: Malissa Hippo, MD;  Location: [facility];  Service:  Endoscopy;  Laterality: N/A;   ESOPHAGOGASTRODUODENOSCOPY (EGD) WITH PROPOFOL N/A 06/11/2015   Procedure: ESOPHAGOGASTRODUODENOSCOPY (EGD) WITH PROPOFOL;  Surgeon: Malissa Hippo, MD;  Location: [facility] ORS;  Service: Endoscopy;  Laterality: N/A;  730   KNEE ARTHROSCOPY WITH MEDIAL MENISECTOMY Right 07/02/2018   Procedure: RIGHT KNEE ARTHROSCOPY, DEBRIDEMENT, MEDIAL MENISECTOMY;  Surgeon: [name], MD;  Location: [facility] OR;  Service: Orthopedics;  Laterality: Right;   KNEE SURGERY     MALONEY DILATION N/A 07/17/2014   Procedure: MALONEY DILATION;  Surgeon: Malissa Hippo, MD;  Location: [facility];  Service: Endoscopy;  Laterality: N/A;   RIGHT/LEFT HEART CATH AND CORONARY ANGIOGRAPHY N/A 05/02/2023   Procedure: RIGHT/LEFT HEART CATH AND CORONARY ANGIOGRAPHY;  Surgeon: [place], Peter M, MD;  Location: [facility] INVASIVE CV LAB;  Service: Cardiovascular;  Laterality: N/A;   TEE WITHOUT CARDIOVERSION N/A 05/14/2023   Procedure: TRANSESOPHAGEAL ECHOCARDIOGRAM;  Surgeon: Corliss Skains, MD;  Location: [facility] OR;  Service: Open Heart Surgery;  Laterality: N/A;   UMBILICAL HERNIA REPAIR  2003   Social History:  reports that she has never smoked. She has never used smokeless tobacco. She reports that she does not drink alcohol and does not use drugs.  Family / Support Systems Marital Status: Married Patient Roles: Spouse, Parent Spouse/Significant Other: Harvie Heck [phone] Children: Josh-son [phone] Other Supports: Friends Anticipated Caregiver: Husband Ability/Limitations of Caregiver: Recently retired 3/28 and pt had surgery 3/31 Caregiver Availability: 24/7 Family Dynamics: Close  knit with husband amd son both will make sure pt has her needs met. Pt is doing quite well and will be a short length of stay here  Social History Preferred language: English Religion: Christian Cultural Background: NA Education: HS Health Literacy - How often do you need to have someone help you when you read  instructions, pamphlets, or other written material from your doctor or pharmacy?: Never Writes: Yes Employment Status: Retired Marine scientist Issues: NA Guardian/Conservator: NA according to MD pt is capable of making her own decisions while here   Abuse/Neglect Abuse/Neglect Assessment Can Be Completed: Yes Physical Abuse: Denies Verbal Abuse: Denies Sexual Abuse: Denies Exploitation of patient/patient's resources: Denies Self-Neglect: Denies  Patient response to: Social Isolation - How often do you feel lonely or isolated from those around you?: Rarely  Emotional Status Pt's affect, behavior and adjustment status: Pt is ready for rehab and to recover from her back surgery. She had open heart surgery last Oct and did well. She hopes to do as well this time. Her husband can assist due to home now and retired. Recent Psychosocial Issues: other health issues-past heart surgery Psychiatric History: No hx and issues. Seems to be coping appropraitely and verbalizes her feelings and concerns Substance Abuse History: NA  Patient / Family Perceptions, Expectations & Goals Pt/Family understanding of illness & functional limitations: Pt can explain her back surgery and precautions, she talks with the MD's roundinf each day and feels understands her plan moving forward. Premorbid pt/family roles/activities: wife, mom, retiree, friend, etc Anticipated changes in roles/activities/participation: resume Pt/family expectations/goals: Pt states: " I hope to be able to move around on mhy own when I leave here."  [employer]: None Premorbid Home Care/DME Agencies: Other (Comment) (has rollatro and bsc, along with tub seat) Transportation available at discharge: husband Is the patient able to respond to transportation needs?: Yes In the past 12 months, has lack of transportation kept you from medical appointments or from getting medications?: No In the past 12  months, has lack of transportation kept you from meetings, work, or from getting things needed for daily living?: No  Discharge Planning Living Arrangements: Spouse/significant other Support Systems: Spouse/significant other, Children, Friends/neighbors Type of Residence: Private residence Insurance Resources: Media planner (specify) Administrator generic) Financial Screen Referred: No Living Expenses: Lives with family Money Management: Spouse, Patient Does the patient have any problems obtaining your medications?: No Home Management: both she and husband Patient/Family Preliminary Plans: Return home with husband who is able to provide 24/7 care if needed. Pt is hopeful she will not need this due to doing well with her moving. Aware team conference tomorrow and will meet iwth and discuss target DC date. Care Coordinator Anticipated Follow Up Needs: HH/OP  Clinical Impression Pleasant female who is motivated to do well here and should. Her husband is able to assist if needed. Will meet with after team conference tomorrow  Lucy Chris 02/04/2024, 10:27 AM

## 2024-02-04 NOTE — Progress Notes (Signed)
 Physical Therapy Session Note  Patient Details  Name: Stacy Frost MRN: [id number] Date of Birth: January 09, 1961  Today's Date: 02/04/2024 PT Individual Time: 0945-1100 PT Individual Time Calculation (min): 75 min   Short Term Goals: Week 1:  PT Short Term Goal 1 (Week 1): STG=LTG due to ELOS  Skilled Therapeutic Interventions/Progress Updates:      Therapy Documentation Precautions:  Precautions Precautions: Back, Fall Recall of Precautions/Restrictions: Impaired (Unable to recall "lift") Precaution/Restrictions Comments: Verbally reviewed precautions, re-education on acronym BLT. Required Braces or Orthoses: Spinal Brace Spinal Brace: Lumbar corset, Applied in sitting position Restrictions Weight Bearing Restrictions Per Provider Order: No  Pt agreeable to PT and with unrated back pain, provided rest and repositioning for relief. Pt largely limited due to sensory and proprioceptive deficits concerning left LE. CGA with stand pivot and gait 67' + 46' with RW with adequate rest. Pt with excessive stride and encouraged to decrease step length and increased UE reliance on RW due to left knee instability/buckling. Transitioned to stair training and pt max A with lateral step with R LE with B UE support on hand rail due to left knee buckling. Pt emotional and PT utilized therapeutic use of self to provide support and encouragement. Pt engaged in mirror therapy with gentle movements including B LE taps, heel raises, marches and reports increased sensation to left LE. Plan to continue NMR and strength training to prepare for discharge. Pt left semi-reclined in bed, all needs in reach and alarm on.     Therapy/Group: Individual Therapy  Truitt Leep Truitt Leep PT, DPT  02/04/2024, 12:40 PM

## 2024-02-04 NOTE — Progress Notes (Signed)
 PROGRESS NOTE   Subjective/Complaints:  Pt reports LBM 2 days ago Says was taking ASA 325 mg daily at home- wants to restart.   Also took Semglee 45 units at 6pm daily at home.  BG's still running 200's - off Semglee at the moment.  Wants to shower- will allow.    ROS:   Pt denies SOB, abd pain, CP, N/V/C/D, and vision changes    Objective:   VAS [place] LOWER EXTREMITY VENOUS (DVT) Result Date: 02/03/2024  Lower Venous DVT Study Patient Name:  Stacy Frost  Date of Exam:   02/03/2024 Medical Rec #: [id number]   Accession #:    [id number] Date of Birth: [birth year]   Patient Gender: F Patient Age:   63 years Exam Location:  [facility] Procedure:      VAS [place] LOWER EXTREMITY VENOUS (DVT) Referring Phys: Mariam Dollar --------------------------------------------------------------------------------  Indications: Edema. Other Indications: Rehab patient. Comparison Study: Previous exam (reflux study) on 04/12/2020 was negative for                   DVT Performing Technologist: Ernestene Mention RVT, RDMS  Examination Guidelines: A complete evaluation includes B-mode imaging, spectral Doppler, color Doppler, and power Doppler as needed of all accessible portions of each vessel. Bilateral testing is considered an integral part of a complete examination. Limited examinations for reoccurring indications may be performed as noted. The reflux portion of the exam is performed with the patient in reverse Trendelenburg.  +---------+---------------+---------+-----------+----------+--------------+ RIGHT    CompressibilityPhasicitySpontaneityPropertiesThrombus Aging +---------+---------------+---------+-----------+----------+--------------+ CFV      Full           Yes      Yes                                 +---------+---------------+---------+-----------+----------+--------------+ SFJ      Full                                                         +---------+---------------+---------+-----------+----------+--------------+ FV Prox  Full           Yes      Yes                                 +---------+---------------+---------+-----------+----------+--------------+ FV Mid   Full           Yes      Yes                                 +---------+---------------+---------+-----------+----------+--------------+ FV DistalFull           Yes      Yes                                 +---------+---------------+---------+-----------+----------+--------------+  PFV      Full                                                        +---------+---------------+---------+-----------+----------+--------------+ POP      Full           Yes      Yes                                 +---------+---------------+---------+-----------+----------+--------------+ PTV      Full                                                        +---------+---------------+---------+-----------+----------+--------------+ PERO     Full                                                        +---------+---------------+---------+-----------+----------+--------------+   +---------+---------------+---------+-----------+----------+--------------+ LEFT     CompressibilityPhasicitySpontaneityPropertiesThrombus Aging +---------+---------------+---------+-----------+----------+--------------+ CFV      Full           Yes      Yes                                 +---------+---------------+---------+-----------+----------+--------------+ SFJ      Full                                                        +---------+---------------+---------+-----------+----------+--------------+ FV Prox  Full           Yes      Yes                                 +---------+---------------+---------+-----------+----------+--------------+ FV Mid   Full           Yes      Yes                                  +---------+---------------+---------+-----------+----------+--------------+ FV DistalFull           Yes      Yes                                 +---------+---------------+---------+-----------+----------+--------------+ PFV      Full                                                        +---------+---------------+---------+-----------+----------+--------------+  POP      Full           Yes      Yes                                 +---------+---------------+---------+-----------+----------+--------------+ PTV      Full                                                        +---------+---------------+---------+-----------+----------+--------------+ PERO     Full                                                        +---------+---------------+---------+-----------+----------+--------------+     Summary: BILATERAL: - No evidence of deep vein thrombosis seen in the lower extremities, bilaterally. -No evidence of popliteal cyst, bilaterally.   *See table(s) above for measurements and observations. Electronically signed by Coral Else MD on 02/03/2024 at 10:47:45 AM.    Final     Recent Labs    02/02/24 1149  WBC 8.4  HGB 9.1*  HCT 26.7*  PLT 249   Recent Labs    02/02/24 1149  NA 133*  K 3.6  CL 94*  CO2 26  GLUCOSE 179*  BUN 14  CREATININE 0.89  CALCIUM 8.5*    Intake/Output Summary (Last 24 hours) at 02/04/2024 0903 Last data filed at 02/04/2024 0800 Gross per 24 hour  Intake 720 ml  Output --  Net 720 ml        Physical Exam: Vital Signs Blood pressure 108/67, pulse 88, temperature 98.1 F (36.7 C), resp. rate 16, height 5\' 6"  (1.676 m), weight 87.1 kg, SpO2 100%.    General: awake, alert, appropriate, sitting EOB with husband at bedside; NAD HENT: conjugate gaze; oropharynx moist CV: regular rate and rhythm; no JVD Pulmonary: CTA B/L; no W/R/R- good air movement GI: soft, NT, ND, (+)BS Psychiatric: appropriate- interactive Neurological:  Ox3 Skin- Back incision covered with honeycomb- no drainage Extremities: No clubbing, cyanosis, or edema. Pulses are 2+ Psych: Pt's affect is a little flat but pleasant  PRIOR EXAMS: Skin: Back incision Clean and intact , small abrasion on L chin Neuro:  Alert and oriented x 3. Normal insight and awareness. Intact Memory. Normal language and speech. Cranial nerve exam unremarkable. MMT: RUE 5/5. LUE 4+/5 prox to distal. RLE HF 2+/5 with pain inhibition, KE 3+, ADF/PF 4/5. LLE 2- to 2+/5 HF KE with pain component to weakness and 1+ to 2/5 ADF/PF. Decreased LT and PP bilateral LE's below knee. Intact saddle sensation. DTR's tr-1+. No abnl resting tone. No cerebellar signs. .  Musculoskeletal: Low back tender to palpation and attempts at trunk movement. Pt wearing LSO    Assessment/Plan: 1. Functional deficits which require 3+ hours per day of interdisciplinary therapy in a comprehensive inpatient rehab setting. Physiatrist is providing close team supervision and 24 hour management of active medical problems listed below. Physiatrist and rehab team continue to assess barriers to discharge/monitor patient progress toward functional and medical goals  Care Tool:  Bathing    Body parts bathed by patient: Right arm,  Left arm, Chest, Abdomen, Right upper leg, Face, Left upper leg   Body parts bathed by helper: Right lower leg, Left lower leg, Front perineal area, Buttocks     Bathing assist Assist Level: Moderate Assistance - Patient 50 - 74%     Upper Body Dressing/Undressing Upper body dressing   What is the patient wearing?: Pull over shirt    Upper body assist Assist Level: Supervision/Verbal cueing    Lower Body Dressing/Undressing Lower body dressing      What is the patient wearing?: Pants, Underwear/pull up     Lower body assist Assist for lower body dressing: Moderate Assistance - Patient 50 - 74%     Toileting Toileting    Toileting assist Assist for toileting:  Moderate Assistance - Patient 50 - 74%     Transfers Chair/bed transfer  Transfers assist     Chair/bed transfer assist level: Minimal Assistance - Patient > 75%     Locomotion Ambulation   Ambulation assist      Assist level: Minimal Assistance - Patient > 75% Assistive device: Walker-rolling Max distance: 17'   Walk 10 feet activity   Assist     Assist level: Minimal Assistance - Patient > 75% Assistive device: Walker-rolling   Walk 50 feet activity   Assist Walk 50 feet with 2 turns activity did not occur: Safety/medical concerns         Walk 150 feet activity   Assist Walk 150 feet activity did not occur: Safety/medical concerns         Walk 10 feet on uneven surface  activity   Assist Walk 10 feet on uneven surfaces activity did not occur: Safety/medical concerns         Wheelchair     Assist Is the patient using a wheelchair?: Yes Type of Wheelchair: Manual    Wheelchair assist level: Supervision/Verbal cueing Max wheelchair distance: 100'    Wheelchair 50 feet with 2 turns activity    Assist        Assist Level: Supervision/Verbal cueing   Wheelchair 150 feet activity     Assist  Wheelchair 150 feet activity did not occur: Safety/medical concerns       Blood pressure 108/67, pulse 88, temperature 98.1 F (36.7 C), resp. rate 16, height 5\' 6"  (1.676 m), weight 87.1 kg, SpO2 100%.  Medical Problem List and Plan: 1. 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   -Back corset when out of bed             -patient may shower             -ELOS/Goals: 9-14 days  Con't CIR PT and OT  IPOC done 2.  Antithrombotics: -DVT/anticoagulation:  Mechanical: Antiembolism stockings, thigh (TED hose) Bilateral lower extremities  -02/03/24 DVT US negative             -antiplatelet therapy: N/A 3. Pain Management:  Neurontin 100 mg daily and 200 mg nightly, Flexeril and oxycodone as needed             -might benefit from scheduled oxycodone prior to therapies  4/7- Pt reports pain meds work- doesn't want things changed right now- will monitor 4. Mood/Behavior/Sleep: Celexa 40 mg daily, Klonopin as needed             -antipsychotic agents: N/A -pt with anxiety about therapy tomorrow. Reassured her that team will work with her at her pace and be  considerate of her pain. I also told her family could stay over with her if it helped her anxiety.  -02/03/24 per pharmacy yesterday, pt on trazodone 150mg  nightly-- but reported sleeping well so hold off on starting that for now.  5. Neuropsych/cognition: This patient is capable of making decisions on her own behalf. 6. Skin/Wound Care: Routine skin checks 7. Fluids/Electrolytes/Nutrition: Routine in and outs, cont vitamins/supplements             -mild hyponatremia on recent labs- Na 133 on 02/02/24             -f/u labs Monday   4/7- labs pending- will monitor for them 8.  Acute blood loss anemia on chronic anemia:        -pt transfused 1u PRBC today for hgb of 7.3 -fe++ supp -02/03/24 Hgb 9.1 yesterday, f/up CBC tomorrow.   4/7- Labs pending- will monitor for labs 9.  History aortic stenosis/MVP.  Status post aortic valve replacement July 2024 per Dr. Cliffton Asters 10.  Hypertension.  Toprol-XL 25 mg twice daily, Lasix 40 mg daily.  Monitor with increased mobility             -controlled at present  4/7- BP controlled- con't regimen Vitals:   02/01/24 2114 02/02/24 0543 02/02/24 1433 02/02/24 1930  BP: (!) 114/52 (!) 130/55 (!) 137/58 128/64   02/03/24 0502 02/03/24 0946 02/03/24 1443 02/03/24 1740  BP: (!) 119/54 122/60 (!) 145/75 129/86   02/03/24 2019 02/04/24 0536  BP: (!) 107/51 108/67      11.  Hyperlipidemia.  Lipitor 80mg  daily 12.  Diabetes mellitus with peripheral neuropathy.  Latest hemoglobin A1c 9.7.                -sugars poorly controlled at  present as well              -pt is on metformin XR 500mg  2 tablets daily as she takes at home             -SSI             -might need adjustments to her standing regimen  - 4/6 CBGs ok control but a little high in the mornings; will add HS SSI but ultimately might need adjustment to her metformin XR if her sugars are persistently elevated. Per pharmacy note yesterday, she was on insulin glargine 30U HS and mounjaro 7.5mg  weekly-- verify this is what she was taking, and potentially adding the glargine back will help  4/7- will add Lantus 10 units nightly (usually takes at 6pm)- took 45 units daily at home, but I don't think needs that yet based on CBG's.  CBG (last 3)  Recent Labs    02/03/24 1655 02/03/24 2115 02/04/24 0538  GLUCAP 199* 253* 205*    14.  Restless leg syndrome.  Mirapex 1 mg nightly 15.  Obesity.  BMI 30.99.  Dietary follow-up 16. Constipation: colace BID, miralax daily  - LBM 4/3, start MiraLAX daily  -02/03/24 LBM yesterday, cont regimen  4/7- LBM 2 days ago- thinks will go today with movement- if doesn't will add Senna to meds tomorrow 17. Small abrasion ok chin  -bacitracin, she reports she is allergic to tape    I spent a total of 43   minutes on total care today- >50% coordination of care- due to  D/w pt as well as nursing and husband about her meds- restarted her ASA and Semglee- will check with Surgery when can start lovenox-  did IPOC  LOS: 3 days A FACE TO FACE EVALUATION WAS PERFORMED  Brietta Manso 02/04/2024, 9:03 AM

## 2024-02-04 NOTE — Progress Notes (Signed)
 Inpatient Rehabilitation  Patient information reviewed and entered into eRehab system by [facility]. Karen Kays., CCC/SLP, PPS Coordinator.  Information including medical coding, functional ability and quality indicators will be reviewed and updated through discharge.

## 2024-02-04 NOTE — Plan of Care (Signed)
  Problem: Consults Goal: RH SPINAL CORD INJURY PATIENT EDUCATION Description:  See Patient Education module for education specifics.  Outcome: Progressing   Problem: SCI BOWEL ELIMINATION Goal: RH STG MANAGE BOWEL WITH ASSISTANCE Description: STG Manage Bowel with mod I Assistance. Outcome: Progressing   Problem: SCI BLADDER ELIMINATION Goal: RH STG MANAGE BLADDER WITH ASSISTANCE Description: STG Manage Bladder With Mod I  Assistance Outcome: Progressing   Problem: RH SKIN INTEGRITY Goal: RH STG SKIN FREE OF INFECTION/BREAKDOWN Description: Manage  skin free of infection/breakdown with mod I assistance Outcome: Progressing   Problem: RH SAFETY Goal: RH STG ADHERE TO SAFETY PRECAUTIONS W/ASSISTANCE/DEVICE Description: STG Adhere to Safety Precautions With  mod I Assistance/Device. Outcome: Progressing   Problem: RH PAIN MANAGEMENT Goal: RH STG PAIN MANAGED AT OR BELOW PT'S PAIN GOAL Description: <4 w/ prns Outcome: Progressing   Problem: RH KNOWLEDGE DEFICIT SCI Goal: RH STG INCREASE KNOWLEDGE OF SELF CARE AFTER SCI Description: Manage  increase knowledge f self care after SCI with mod I assistance using educational materials provided Outcome: Progressing

## 2024-02-04 NOTE — Plan of Care (Signed)
  Problem: RH SKIN INTEGRITY Goal: RH STG SKIN FREE OF INFECTION/BREAKDOWN Description: Manage  skin free of infection/breakdown with mod I assistance Outcome: Progressing

## 2024-02-05 DIAGNOSIS — M5416 Radiculopathy, lumbar region: Secondary | ICD-10-CM | POA: Diagnosis not present

## 2024-02-05 LAB — CULTURE, BLOOD (ROUTINE X 2)
Culture: NO GROWTH
Culture: NO GROWTH
Special Requests: ADEQUATE
Special Requests: ADEQUATE

## 2024-02-05 LAB — GLUCOSE, CAPILLARY
Glucose-Capillary: 173 mg/dL — ABNORMAL HIGH (ref 70–99)
Glucose-Capillary: 190 mg/dL — ABNORMAL HIGH (ref 70–99)
Glucose-Capillary: 204 mg/dL — ABNORMAL HIGH (ref 70–99)
Glucose-Capillary: 152 mg/dL — ABNORMAL HIGH (ref 70–99)

## 2024-02-05 MED ORDER — POTASSIUM CHLORIDE CRYS ER 20 MEQ PO TBCR
40.0000 meq | EXTENDED_RELEASE_TABLET | Freq: Once | ORAL | Status: AC
  Administered 2024-02-05: 40 meq via ORAL
  Filled 2024-02-05: qty 2

## 2024-02-05 MED ORDER — ENOXAPARIN SODIUM 40 MG/0.4ML IJ SOSY
40.0000 mg | PREFILLED_SYRINGE | INTRAMUSCULAR | Status: DC
  Administered 2024-02-05 – 2024-02-12 (×8): 40 mg via SUBCUTANEOUS
  Filled 2024-02-05 (×8): qty 0.4

## 2024-02-05 NOTE — Patient Care Conference (Signed)
 Inpatient RehabilitationTeam Conference and Plan of Care Update Date: 02/05/2024   Time: 1136 am    Patient Name: Stacy Frost      Medical Record Number: [id number]  Date of Birth: 1960-11-21 Sex: Female         Room/Bed: 4M02C/4M02C-01 Payor Info: Payor: Monia Pouch / Plan: Candelaria Celeste / Product Type: *No Product type* /    Admit Date/Time:  02/01/2024  3:30 PM  Primary Diagnosis:  Lumbar radiculopathy  Hospital Problems: Principal Problem:   Lumbar radiculopathy    Expected Discharge Date: Expected Discharge Date: 02/13/24  Team Members Present: Physician leading conference: Dr. Genice Rouge Social Worker Present: Dossie Der, LCSW Nurse Present: Konrad Dolores, RN PT Present: Truitt Leep, PT OT Present: Velia Meyer, OT PPS Coordinator present : Fae Pippin, SLP     Current Status/Progress Goal Weekly Team Focus  Bowel/Bladder   continent bowel/bladder   to remain continent    Remain continent     Swallow/Nutrition/ Hydration               ADL's   SBA UB ADL and bathing, CGA LB ADL with AE, CGA toileting   mod I overall, supervision tub/shower transfers   ADL retraining, transfers, generalized strengthening    Mobility   supervision bed, CGA/min transfers and gait x 75', max A step (significant L LE buckle) with handrails in side stepping pattern   min A  L LE awareness and NMR    Communication                Safety/Cognition/ Behavioral Observations               Pain   0/0   remain 0/0      Assess pain q shift  Skin   Honeycone dressing on pt's back   Continue to heal     Maintain skin integrity entire stay on rehab    Discharge Planning:  Home with husband who just retired and can provide 24/7 care. Pt is motivated to do well, barrier is her lack of sensation in her leg.    Team Discussion: Patient  was admitted  status post decompression and PLIF due to multilevel radiculopathy (L2-L5) d/t severe stenosis/spondylolisthesis.  Patient with  pain, fluctuating blood sugars: medication adjusted by MD. Patient limited by weakness  and buckling of left lower leg.  Patient on target to meet rehab goals: yes, Patient requires supervision with upper body care and CGA with lower body care with adaptive equipment. Patient requires CGA/ min a assistance with transfers. Patient able to ambulate up to 78' CGA/min assistance. Over all goals for discharge are set for minimal assistance - Mod I.   *See Care Plan and progress notes for long and short-term goals.   Revisions to Treatment Plan:  N/a    Teaching Needs: Safety, dietary modifications, diabetic education, incision care, transfers, toileting, medications,lumbar corset education, etc.   Current Barriers to Discharge: Decreased caregiver support  Possible Resolutions to Barriers: Family education DME: [employer]     Medical Summary Current Status: Incision looks great- take off honeycomb dressing- sleeping well- pain usually controlled with meds- CBGs out ofcontrol  Barriers to Discharge: Medical stability;Uncontrolled Diabetes;Weight bearing restrictions;Self-care education  Barriers to Discharge Comments: pain 4-6/10- limited by out of control CBGs- d/c honeycomb- dry dressing- obesity- BMI 31 Possible Resolutions to [employer] Focus: goals supervision to Mod I- will upgrade min A goals--  will add back Lantus slowly- sleep- and replete K+- d/c 4/16  Continued Need for Acute Rehabilitation Level of Care: The patient requires daily medical management by a physician with specialized training in physical medicine and rehabilitation for the following reasons: Direction of a multidisciplinary physical rehabilitation program to maximize functional independence : Yes Medical management of patient stability for increased activity during participation in an intensive rehabilitation regime.: Yes Analysis of laboratory values and/or radiology reports with any subsequent  need for medication adjustment and/or medical intervention. : Yes   I attest that I was present, lead the team conference, and concur with the assessment and plan of the team.   Konrad Dolores 02/05/2024, 1136 am

## 2024-02-05 NOTE — Progress Notes (Signed)
 Occupational Therapy Session Note  Patient Details  Name: Stacy Frost MRN: [id number] Date of Birth: 12-08-1960  Today's Date: 02/05/2024 OT Individual Time: 4098-1191 OT Individual Time Calculation (min): 75 min    Short Term Goals: Week 1:  OT Short Term Goal 1 (Week 1): STGs=LTGs due to patient's estimated length of stay.  Skilled Therapeutic Interventions/Progress Updates:      Therapy Documentation Precautions:  Precautions Precautions: Back, Fall Recall of Precautions/Restrictions: Impaired (Unable to recall "lift") Precaution/Restrictions Comments: Verbally reviewed precautions, re-education on acronym BLT. Required Braces or Orthoses: Spinal Brace Spinal Brace: Lumbar corset, Applied in sitting position Restrictions Weight Bearing Restrictions Per Provider Order: No General: "Can we do a shower?" Pt supine in bed upon OT arrival, agreeable to OT session.  Pain: 6/10 pain reported in low back, activity, intermittent rest breaks, distractions provided for pain management, pt reports tolerable to proceed.    ADL: OT providing skilled intervention on ADL retraining in order to increase independence with tasks and increase activity tolerance. Pt completed the following tasks at the current level of assist: Bed mobility: SBA supine>EOB from raised HOB Grooming: SBA seated at sink for hair care after bathing, applying makeup UB dressing: SBA donning/doffing shirt seated unsupported with good trunk control noted, also able to apply lumbar corset with set up LB dressing: CGA for support in standing to don/doff  Footwear: SBA in figure 4 method to don/doff socks and shoes Shower transfer: CGA with RW ambulating from EOB><TTB Bathing: SBA overall in sitting on TTB, OT issued long handled razor for shaving in order to maintain back prec with good success    Pt seated in W/C at end of session with W/C alarm donned, call light within reach and 4Ps assessed.    Therapy/Group:  Individual Therapy  Velia Meyer, OTD, OTR/L 02/05/2024, 4:29 PM

## 2024-02-05 NOTE — Progress Notes (Signed)
 Occupational Therapy Session Note  Patient Details  Name: Maeson Lourenco MRN: [id number] Date of Birth: 12/31/1960  Today's Date: 02/05/2024 OT Individual Time: 1130-1200 OT Individual Time Calculation (min): 30 min    Short Term Goals: Week 1:  OT Short Term Goal 1 (Week 1): STGs=LTGs due to patient's estimated length of stay.  Skilled Therapeutic Interventions/Progress Updates:    Skilled OT intervention with focus on BUE strengthening and general conditioning to increase pt's independence with BADLs. UBE exercise 7 mins x 2 level 3 35RPM pace. Extended rest breaks. Pt attempted to perform in reverse but unable to complete in reverse. No increase in pain. Pt returned to room and remained in w/c with all needs within reach.   Therapy Documentation Precautions:  Precautions Precautions: Back, Fall Recall of Precautions/Restrictions: Impaired (Unable to recall "lift") Precaution/Restrictions Comments: Verbally reviewed precautions, re-education on acronym BLT. Required Braces or Orthoses: Spinal Brace Spinal Brace: Lumbar corset, Applied in sitting position Restrictions Weight Bearing Restrictions Per Provider Order: No   Pain: Pt c/o 5/10 back pain; meds admin prior to therapy       Therapy/Group: Individual Therapy  Rich Brave 02/05/2024, 12:04 PM

## 2024-02-05 NOTE — Progress Notes (Signed)
 Physical Therapy Session Note  Patient Details  Name: Stacy Frost MRN: [id number] Date of Birth: 07-Mar-1961  Today's Date: 02/05/2024 PT Individual Time: 1502-1528 PT Individual Time Calculation (min): 26 min   Short Term Goals: Week 1:  PT Short Term Goal 1 (Week 1): STG=LTG due to ELOS  Skilled Therapeutic Interventions/Progress Updates:    Pt presents in room in [facility], reports fatigue but does not report pain. Pt agreeable to PT. Session focused on therapeutic activities for therapeutic rest break and education as well as therapeutic exercise for BUE/BLE strengthening and activity tolerance.  Pt transported to ortho gym dependently for time management and energy conservation. Pt completes stand step transfer with RW CGA no instance of knee buckling to sit on nustep. Pt completes 10  minutes conitnuous training on nustep L2 with BUE/BLE, completes 696 steps total. Pt completes stand step transfer with RW back to [facility] following activity.  Pt educated on CLOF and expected progress with pt verbalizing understanding. Pt returns to room, completes stand step transfer with RW CGA to bed, completes sit to supine with supervision. Pt remains supine with all needs within reach, cal light in place and bed alarm activated at end of session.   Therapy Documentation Precautions:  Precautions Precautions: Back, Fall Recall of Precautions/Restrictions: Impaired (Unable to recall "lift") Precaution/Restrictions Comments: Verbally reviewed precautions, re-education on acronym BLT. Required Braces or Orthoses: Spinal Brace Spinal Brace: Lumbar corset, Applied in sitting position Restrictions Weight Bearing Restrictions Per Provider Order: No   Therapy/Group: Individual Therapy  Edwin Cap PT, DPT 02/05/2024, 3:36 PM

## 2024-02-05 NOTE — Progress Notes (Signed)
 PROGRESS NOTE   Subjective/Complaints:  LBM yesterday  CBG was 160 this Am per pt Walking 174 ft yesterday total- L hip feels locked- harder ot walk to bathroom this AM-  Also notes Stacy Frost caught her from falling yesterday-   Wants grounds pass.   We discussed Lovenox and how can prevent DVT/PE.   ROS:    Pt denies SOB, abd pain, CP, N/V/C/D, and vision changes   Objective:   VAS [place] LOWER EXTREMITY VENOUS (DVT) Result Date: 02/03/2024  Lower Venous DVT Study Patient Name:  Stacy Frost  Date of Exam:   02/03/2024 Medical Rec #: [id number]   Accession #:    [id number] Date of Birth: Oct 01, 1961   Patient Gender: F Patient Age:   63 years Exam Location:  [facility] Procedure:      VAS [place] LOWER EXTREMITY VENOUS (DVT) Referring Phys: Mariam Dollar --------------------------------------------------------------------------------  Indications: Edema. Other Indications: Rehab patient. Comparison Study: Previous exam (reflux study) on 04/12/2020 was negative for                   DVT Performing Technologist: Ernestene Mention RVT, RDMS  Examination Guidelines: A complete evaluation includes B-mode imaging, spectral Doppler, color Doppler, and power Doppler as needed of all accessible portions of each vessel. Bilateral testing is considered an integral part of a complete examination. Limited examinations for reoccurring indications may be performed as noted. The reflux portion of the exam is performed with the patient in reverse Trendelenburg.  +---------+---------------+---------+-----------+----------+--------------+ RIGHT    CompressibilityPhasicitySpontaneityPropertiesThrombus Aging +---------+---------------+---------+-----------+----------+--------------+ CFV      Full           Yes      Yes                                 +---------+---------------+---------+-----------+----------+--------------+ SFJ      Full                                                         +---------+---------------+---------+-----------+----------+--------------+ FV Prox  Full           Yes      Yes                                 +---------+---------------+---------+-----------+----------+--------------+ FV Mid   Full           Yes      Yes                                 +---------+---------------+---------+-----------+----------+--------------+ FV DistalFull           Yes      Yes                                 +---------+---------------+---------+-----------+----------+--------------+  PFV      Full                                                        +---------+---------------+---------+-----------+----------+--------------+ POP      Full           Yes      Yes                                 +---------+---------------+---------+-----------+----------+--------------+ PTV      Full                                                        +---------+---------------+---------+-----------+----------+--------------+ PERO     Full                                                        +---------+---------------+---------+-----------+----------+--------------+   +---------+---------------+---------+-----------+----------+--------------+ LEFT     CompressibilityPhasicitySpontaneityPropertiesThrombus Aging +---------+---------------+---------+-----------+----------+--------------+ CFV      Full           Yes      Yes                                 +---------+---------------+---------+-----------+----------+--------------+ SFJ      Full                                                        +---------+---------------+---------+-----------+----------+--------------+ FV Prox  Full           Yes      Yes                                 +---------+---------------+---------+-----------+----------+--------------+ FV Mid   Full           Yes      Yes                                  +---------+---------------+---------+-----------+----------+--------------+ FV DistalFull           Yes      Yes                                 +---------+---------------+---------+-----------+----------+--------------+ PFV      Full                                                        +---------+---------------+---------+-----------+----------+--------------+  POP      Full           Yes      Yes                                 +---------+---------------+---------+-----------+----------+--------------+ PTV      Full                                                        +---------+---------------+---------+-----------+----------+--------------+ PERO     Full                                                        +---------+---------------+---------+-----------+----------+--------------+     Summary: BILATERAL: - No evidence of deep vein thrombosis seen in the lower extremities, bilaterally. -No evidence of popliteal cyst, bilaterally.   *See table(s) above for measurements and observations. Electronically signed by Coral Else MD on 02/03/2024 at 10:47:45 AM.    Final     Recent Labs    02/02/24 1149 02/04/24 0941  WBC 8.4 8.6  HGB 9.1* 9.3*  HCT 26.7* 27.8*  PLT 249 322   Recent Labs    02/02/24 1149 02/04/24 0941  NA 133* 133*  K 3.6 3.4*  CL 94* 96*  CO2 26 29  GLUCOSE 179* 237*  BUN 14 11  CREATININE 0.89 0.90  CALCIUM 8.5* 9.1    Intake/Output Summary (Last 24 hours) at 02/05/2024 0849 Last data filed at 02/04/2024 2304 Gross per 24 hour  Intake 677 ml  Output --  Net 677 ml        Physical Exam: Vital Signs Blood pressure 117/66, pulse 71, temperature 98.2 F (36.8 C), temperature source Oral, resp. rate 17, height 5\' 6"  (1.676 m), weight 87.1 kg, SpO2 100%.     General: awake, alert, appropriate, sitting EOB; husband at bedside;  NAD HENT: conjugate gaze; oropharynx moist CV: regular rate and rhythm; no JVD Pulmonary: CTA B/L; no  W/R/R- good air movement GI: soft, NT, ND, (+)BS Psychiatric: appropriate Neurological: Ox3  Extremities: No clubbing, cyanosis, or edema. Pulses are 2+ Psych: Pt's affect is a little flat but pleasant  PRIOR EXAMS: Skin: Back incision Clean and intact , small abrasion on L chin Neuro:  Alert and oriented x 3. Normal insight and awareness. Intact Memory. Normal language and speech. Cranial nerve exam unremarkable. MMT: RUE 5/5. LUE 4+/5 prox to distal. RLE HF 2+/5 with pain inhibition, KE 3+, ADF/PF 4/5. LLE 2- to 2+/5 HF KE with pain component to weakness and 1+ to 2/5 ADF/PF. Decreased LT and PP bilateral LE's below knee. Intact saddle sensation. DTR's tr-1+. No abnl resting tone. No cerebellar signs. .  Musculoskeletal: Low back tender to palpation and attempts at trunk movement. Pt wearing LSO    Assessment/Plan: 1. Functional deficits which require 3+ hours per day of interdisciplinary therapy in a comprehensive inpatient rehab setting. Physiatrist is providing close team supervision and 24 hour management of active medical problems listed below. Physiatrist and rehab team continue to assess barriers to discharge/monitor patient progress toward functional and medical goals  Care Tool:  Bathing    Body parts bathed by patient: Right arm, Left arm, Chest, Abdomen, Right upper leg, Face, Left upper leg   Body parts bathed by helper: Right lower leg, Left lower leg, Front perineal area, Buttocks     Bathing assist Assist Level: Moderate Assistance - Patient 50 - 74%     Upper Body Dressing/Undressing Upper body dressing   What is the patient wearing?: Pull over shirt    Upper body assist Assist Level: Supervision/Verbal cueing    Lower Body Dressing/Undressing Lower body dressing      What is the patient wearing?: Pants, Underwear/pull up     Lower body assist Assist for lower body dressing: Moderate Assistance - Patient 50 - 74%     Toileting Toileting    Toileting  assist Assist for toileting: Moderate Assistance - Patient 50 - 74%     Transfers Chair/bed transfer  Transfers assist     Chair/bed transfer assist level: Minimal Assistance - Patient > 75%     Locomotion Ambulation   Ambulation assist      Assist level: Minimal Assistance - Patient > 75% Assistive device: Walker-rolling Max distance: 17'   Walk 10 feet activity   Assist     Assist level: Minimal Assistance - Patient > 75% Assistive device: Walker-rolling   Walk 50 feet activity   Assist Walk 50 feet with 2 turns activity did not occur: Safety/medical concerns         Walk 150 feet activity   Assist Walk 150 feet activity did not occur: Safety/medical concerns         Walk 10 feet on uneven surface  activity   Assist Walk 10 feet on uneven surfaces activity did not occur: Safety/medical concerns         Wheelchair     Assist Is the patient using a wheelchair?: Yes Type of Wheelchair: Manual    Wheelchair assist level: Supervision/Verbal cueing Max wheelchair distance: 100'    Wheelchair 50 feet with 2 turns activity    Assist        Assist Level: Supervision/Verbal cueing   Wheelchair 150 feet activity     Assist      Assist Level: Moderate Assistance - Patient 50 - 74%   Blood pressure 117/66, pulse 71, temperature 98.2 F (36.8 C), temperature source Oral, resp. rate 17, height 5\' 6"  (1.676 m), weight 87.1 kg, SpO2 100%.  Medical Problem List and Plan: 1. 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   -Back corset when out of bed             -patient may shower             -ELOS/Goals: 9-14 days  Con't CIR PT and OT  Team conference today to determine length of stay 2.  Antithrombotics: -DVT/anticoagulation:  Mechanical: Antiembolism stockings, thigh (TED hose) Bilateral lower extremities  -02/03/24 DVT US  negative 4/8- will start Lovenox 40 mg daily since it's been 7 days since surgery- pt only walking ~ 15ft total/day             -antiplatelet therapy: N/A 3. Pain Management: Neurontin 100 mg daily and 200 mg nightly, Flexeril and oxycodone as needed             -might benefit from scheduled oxycodone prior to therapies  4/7- Pt reports pain meds work- doesn't want things changed right now- will monitor 4. Mood/Behavior/Sleep: Celexa  40 mg daily, Klonopin as needed             -antipsychotic agents: N/A -pt with anxiety about therapy tomorrow. Reassured her that team will work with her at her pace and be considerate of her pain. I also told her family could stay over with her if it helped her anxiety.  -02/03/24 per pharmacy yesterday, pt on trazodone 150mg  nightly-- but reported sleeping well so hold off on starting that for now.  4/8- sleeping well per pt 5. Neuropsych/cognition: This patient is capable of making decisions on her own behalf. 6. Skin/Wound Care: Routine skin checks 7. Fluids/Electrolytes/Nutrition: Routine in and outs, cont vitamins/supplements             -mild hyponatremia on recent labs- Na 133 on 02/02/24             -f/u labs Monday   4/7- labs pending- will monitor for them 8.  Acute blood loss anemia on chronic anemia:        -pt transfused 1u PRBC today for hgb of 7.3 -fe++ supp -02/03/24 Hgb 9.1 yesterday, f/up CBC tomorrow.   4/7- Labs pending- will monitor for labs 9.  History aortic stenosis/MVP.  Status post aortic valve replacement July 2024 per Dr. Cliffton Asters 10.  Hypertension.  Toprol-XL 25 mg twice daily, Lasix 40 mg daily.  Monitor with increased mobility             -controlled at present  4/7- 4/8BP controlled- con't regimen Vitals:   02/02/24 1433 02/02/24 1930 02/03/24 0502 02/03/24 0946  BP: (!) 137/58 128/64 (!) 119/54 122/60   02/03/24 1443 02/03/24 1740 02/03/24 2019 02/04/24 0536  BP: (!) 145/75 129/86 (!) 107/51 108/67   02/04/24 1119 02/04/24  1326 02/04/24 2129 02/05/24 0549  BP: 114/60 (!) 153/61 113/62 117/66      11.  Hyperlipidemia.  Lipitor 80mg  daily 12.  Diabetes mellitus with peripheral neuropathy.  Latest hemoglobin A1c 9.7.                -sugars poorly controlled at present as well              -pt is on metformin XR 500mg  2 tablets daily as she takes at home             -SSI             -might need adjustments to her standing regimen  - 4/6 CBGs ok control but a little high in the mornings; will add HS SSI but ultimately might need adjustment to her metformin XR if her sugars are persistently elevated. Per pharmacy note yesterday, she was on insulin glargine 30U HS and mounjaro 7.5mg  weekly-- verify this is what she was taking, and potentially adding the glargine back will help  4/7- will add Lantus 10 units nightly (usually takes at 6pm)- took 45 units daily at home, but I don't think needs that yet based on CBG's.  4/8- Pt's Cbgs 159 this AM- still in 200's, but will increase Insulin tomorrow if stills needs it CBG (last 3)  Recent Labs    02/04/24 1751 02/04/24 2126 02/05/24 0551  GLUCAP 159* 240* 173*    14.  Restless leg syndrome.  Mirapex 1 mg nightly 15.  Obesity.  BMI 30.99.  Dietary follow-up 16. Constipation: colace BID, miralax daily  - LBM 4/3, start MiraLAX daily  -02/03/24 LBM yesterday, cont regimen  4/7- LBM 2 days ago- thinks will go today with movement- if  doesn't will add Senna to meds tomorrow  4/8- LBM yesterday 17. Small abrasion ok chin  -bacitracin, she reports she is allergic to tape 18. Hypokalemia  4/8- K+ 3.4- will report KCL 40 mEq x1- and recheck Thursday    I spent a total of 39   minutes on total care today- >50% coordination of care- due to  Team conference- d/w pt and PT about near fall yesterday- ordered grounds pass and question about 15/7- will address with team- also will start Lovenox-will check labs Thursday since starting Lovenox  LOS: 4 days A FACE TO FACE  EVALUATION WAS PERFORMED  Koehn Salehi 02/05/2024, 8:49 AM

## 2024-02-05 NOTE — Progress Notes (Signed)
 Physical Therapy Session Note  Patient Details  Name: Stacy Frost MRN: [id number] Date of Birth: Mar 13, 1961  Today's Date: 02/05/2024 PT Individual Time: 1300-1400 PT Individual Time Calculation (min): 60 min   Short Term Goals: Week 1:  PT Short Term Goal 1 (Week 1): STG=LTG due to ELOS  Skilled Therapeutic Interventions/Progress Updates:      Therapy Documentation Precautions:  Precautions Precautions: Back, Fall Recall of Precautions/Restrictions: Impaired (Unable to recall "lift") Precaution/Restrictions Comments: Verbally reviewed precautions, re-education on acronym BLT. Required Braces or Orthoses: Spinal Brace Spinal Brace: Lumbar corset, Applied in sitting position Restrictions Weight Bearing Restrictions Per Provider Order: No  Pt agreeable to PT with emphasis on LE strength training as pt with knee instability and buckling (L>R). Dependent w/c transport hospital room <>main gym parallel bars. Pt performed following exercises to increase L LE weight bearing and address weakness:   -static stance no UE support   -lateral weight shifting   -lateral weight shift with alternating heel raise (min A for left knee block and cues for quad activation)   -mini squats 6 x 3 with right LE positioned on 2 inch step to increase left quad activation (mod/max A to prevent buckling)   Pt returned to room, left seated at bedside with all needs in reach. No reports of pain.   Therapy/Group: Individual Therapy  Truitt Leep Truitt Leep PT, DPT  02/05/2024, 3:28 PM

## 2024-02-05 NOTE — Progress Notes (Signed)
 Honeycomb dressing removed per MD order. Skin is intact, some old drainage and steri strip noted. Otherwise, no signs of infection.Dry dressing applied.

## 2024-02-05 NOTE — Progress Notes (Signed)
 Met with patient to review current situation, team conference and plan of care. Reviewed medications,lumbar corset when OOB, checked incision site covered with honeycomb dressing CDI. Reviewed diabetes management. Continue to follow along to provide educational needs to facilitate preparation for discharge.

## 2024-02-05 NOTE — Progress Notes (Signed)
 Patient ID: Stacy Frost, female   DOB: 11-Nov-1960, 63 y.o.   MRN: [id number]  Met with pt to give her the team conference update regarding goals of supervision level and target discharge date of 4/16. She feels she is making progress and is working on being aware of her leg since she has not feeling and sensation at this time. Will await team's recommendations and work on discharge needs.

## 2024-02-06 LAB — GLUCOSE, CAPILLARY
Glucose-Capillary: 187 mg/dL — ABNORMAL HIGH (ref 70–99)
Glucose-Capillary: 227 mg/dL — ABNORMAL HIGH (ref 70–99)
Glucose-Capillary: 132 mg/dL — ABNORMAL HIGH (ref 70–99)
Glucose-Capillary: 384 mg/dL — ABNORMAL HIGH (ref 70–99)

## 2024-02-06 MED ORDER — INSULIN GLARGINE-YFGN 100 UNIT/ML ~~LOC~~ SOLN
13.0000 [IU] | Freq: Every day | SUBCUTANEOUS | Status: DC
  Administered 2024-02-06 – 2024-02-07 (×2): 13 [IU] via SUBCUTANEOUS
  Filled 2024-02-06 (×3): qty 0.13

## 2024-02-06 NOTE — Plan of Care (Signed)
  Problem: RH SKIN INTEGRITY Goal: RH STG SKIN FREE OF INFECTION/BREAKDOWN Description: Manage  skin free of infection/breakdown with mod I assistance Outcome: Progressing   Problem: RH SAFETY Goal: RH STG ADHERE TO SAFETY PRECAUTIONS W/ASSISTANCE/DEVICE Description: STG Adhere to Safety Precautions With  mod I Assistance/Device. Outcome: Progressing   Problem: RH PAIN MANAGEMENT Goal: RH STG PAIN MANAGED AT OR BELOW PT'S PAIN GOAL Description: <4 w/ prns Outcome: Progressing   Problem: RH KNOWLEDGE DEFICIT SCI Goal: RH STG INCREASE KNOWLEDGE OF SELF CARE AFTER SCI Description: Manage  increase knowledge f self care after SCI with mod I assistance using educational materials provided Outcome: Progressing

## 2024-02-06 NOTE — Progress Notes (Signed)
 Physical Therapy Session Note  Patient Details  Name: Stacy Frost MRN: [id number] Date of Birth: 09-14-1961  Today's Date: 02/06/2024 PT Individual Time: 1100-1200 PT Individual Time Calculation (min): 60 min   Short Term Goals: Week 1:  PT Short Term Goal 1 (Week 1): STG=LTG due to ELOS  Skilled Therapeutic Interventions/Progress Updates:      Therapy Documentation Precautions:  Precautions Precautions: Back, Fall Recall of Precautions/Restrictions: Impaired (Unable to recall "lift") Precaution/Restrictions Comments: Verbally reviewed precautions, re-education on acronym BLT. Required Braces or Orthoses: Spinal Brace Spinal Brace: Lumbar corset, Applied in sitting position Restrictions Weight Bearing Restrictions Per Provider Order: No  Treatment Session 1:   Pt agreeable to PT session with emphasis on global LE strength training to improve stair training. Dependent transport by w/c to main gym and pt engaged in blocked practice of stair navigation. Pt performed 3 inch step taps min A and progressed to navigation single step in lateral and forward direction. Transitioned to 6 inch step taps min A and progressed to navigation single step in lateral direction with B UE support on rails. Pt with 2 episodes of buckling requiring min A for recovery. PT provided pt with home measurement sheet. Pt returned to room and left seated at bedside with all needs in reach, no reports of pain.     Treatment Session 2:   Pt agreeable to PT and requested to utilize restroom. CGA with ambulatory transfer with RW to toilet, continent of bladder. PT obtained rollator and pt engaged in gait training as was pt's primary AD before admission. Pt ambulated 30'  + 45' + 80'+ 2' CGA/min A with no buckling but increased UE reliance on walker. Pt provided min cues for hand placement and energy conservation. Transitioned to UE strength training as pt performed super sets of 5 OH press (x 10) and chest press (x 10)  with 4# weighted ball. Pt also performed combined hip flexion, LAQ, and SLR R LE x 5 and L LE x 5. Pt challenged with eccentric quad control of left LE with 2.5# ankle weight. Pt returned to room, left seated at bedside with all needs in reach.      Therapy/Group: Individual Therapy  Truitt Leep Truitt Leep PT, DPT  02/06/2024, 4:47 PM

## 2024-02-06 NOTE — Progress Notes (Signed)
 Occupational Therapy Session Note  Patient Details  Name: Stacy Frost MRN: [id number] Date of Birth: August 08, 1961  Today's Date: 02/06/2024 OT Individual Time: 0800-0900 & 1350-1430 OT Individual Time Calculation (min): 60 min & 40 min   Short Term Goals: Week 1:  OT Short Term Goal 1 (Week 1): STGs=LTGs due to patient's estimated length of stay.  Skilled Therapeutic Interventions/Progress Updates:      Therapy Documentation Precautions:  Precautions Precautions: Back, Fall Recall of Precautions/Restrictions: Impaired (Unable to recall "lift") Precaution/Restrictions Comments: Verbally reviewed precautions, re-education on acronym BLT. Required Braces or Orthoses: Spinal Brace Spinal Brace: Lumbar corset, Applied in sitting position Restrictions Weight Bearing Restrictions Per Provider Order: No Session 1 General: "I feel so much better" Pt supine in bed upon OT arrival, agreeable to OT session.  Pain:  7/10 pain reported in low back, pt reporting recent medication, activity, intermittent rest breaks, distractions provided for pain management, pt reports tolerable to proceed.   ADL: OT providing skilled intervention on ADL retraining in order to increase independence with tasks and increase activity tolerance. Pt completed the following tasks at the current level of assist: Bed mobility: SBA supine>EOB from raised HOB Grooming: SBA seated at sink for hair care after bathing, applying makeup graded up by completing in standing at [facility] with VC to increase weight bearing on LLE, standing for ~8 min with pt reported fatigue in BLE UB dressing: SBA donning/doffing shirt seated unsupported with good trunk control noted, also able to apply lumbar corset with set up LB dressing: SBA for support in standing to don/doff  Footwear: SBA in figure 4 method to don/doff socks and shoes Shower transfer: SBA with RW ambulating from EOB><TTB Bathing: SBA overall in sitting on TTB, OT issued long  handled razor for shaving in order to maintain back prec with good success    Pt seated in W/C at end of session with W/C alarm donned, call light within reach and 4Ps assessed.    Session 2 General: "This was nice" Pt supine in bed upon OT arrival, agreeable to OT session.  Pain: unrated pain reported in full body (musculoskeletal), activity, intermittent rest breaks, distractions provided for pain management, pt reports tolerable to proceed.   Other Treatments: OT providing skilled intervention on UB/LB stretching for major muscle groups in order to decrease pain and muscle tightness. Pt instructed to complete stretches within back precautions and hold for 10 seconds for each stretch, rest and repeat. Pt reported decreased pain after stretching.    Pt supine in bed with bed alarm activated, 2 bed rails up, call light within reach and 4Ps assessed.   Therapy/Group: Individual Therapy  Velia Meyer, OTD, OTR/L 02/06/2024, 3:50 PM

## 2024-02-06 NOTE — Progress Notes (Signed)
 PROGRESS NOTE   Subjective/Complaints:  Pt reports having pain in back this AM- feels like a knot in low back- base of spine.  No pain meds yet so far.  LBM last night.   ROS:    Pt denies SOB, abd pain, CP, N/V/C/D, and vision changes    Objective:   No results found.   Recent Labs    02/04/24 0941  WBC 8.6  HGB 9.3*  HCT 27.8*  PLT 322   Recent Labs    02/04/24 0941  NA 133*  K 3.4*  CL 96*  CO2 29  GLUCOSE 237*  BUN 11  CREATININE 0.90  CALCIUM 9.1    Intake/Output Summary (Last 24 hours) at 02/06/2024 6045 Last data filed at 02/06/2024 0801 Gross per 24 hour  Intake 960 ml  Output --  Net 960 ml        Physical Exam: Vital Signs Blood pressure 128/65, pulse 79, temperature 98 F (36.7 C), temperature source Oral, resp. rate 18, height 5\' 6"  (1.676 m), weight 87.1 kg, SpO2 99%.     General: awake, alert, appropriate,  sitting EOB with husband in room; NAD HENT: conjugate gaze; oropharynx moist CV: regular rate and rhythm; no JVD Pulmonary: CTA B/L; no W/R/R- good air movement GI: soft, NT, ND, (+)BS Psychiatric: appropriate Neurological: Ox3 Skin- incision has clean dry dressing in place Extremities: No clubbing, cyanosis, or edema. Pulses are 2+ Psych: Pt's affect is a little flat but pleasant  PRIOR EXAMS: Skin: Back incision Clean and intact , small abrasion on L chin Neuro:  Alert and oriented x 3. Normal insight and awareness. Intact Memory. Normal language and speech. Cranial nerve exam unremarkable. MMT: RUE 5/5. LUE 4+/5 prox to distal. RLE HF 2+/5 with pain inhibition, KE 3+, ADF/PF 4/5. LLE 2- to 2+/5 HF KE with pain component to weakness and 1+ to 2/5 ADF/PF. Decreased LT and PP bilateral LE's below knee. Intact saddle sensation. DTR's tr-1+. No abnl resting tone. No cerebellar signs. .  Musculoskeletal: Low back tender to palpation and attempts at trunk movement. Pt wearing  LSO    Assessment/Plan: 1. Functional deficits which require 3+ hours per day of interdisciplinary therapy in a comprehensive inpatient rehab setting. Physiatrist is providing close team supervision and 24 hour management of active medical problems listed below. Physiatrist and rehab team continue to assess barriers to discharge/monitor patient progress toward functional and medical goals  Care Tool:  Bathing    Body parts bathed by patient: Right arm, Left arm, Chest, Abdomen, Right upper leg, Face, Left upper leg   Body parts bathed by helper: Right lower leg, Left lower leg, Front perineal area, Buttocks     Bathing assist Assist Level: Moderate Assistance - Patient 50 - 74%     Upper Body Dressing/Undressing Upper body dressing   What is the patient wearing?: Pull over shirt    Upper body assist Assist Level: Supervision/Verbal cueing    Lower Body Dressing/Undressing Lower body dressing      What is the patient wearing?: Pants, Underwear/pull up     Lower body assist Assist for lower body dressing: Moderate Assistance - Patient 50 - 74%  Toileting Toileting    Toileting assist Assist for toileting: Moderate Assistance - Patient 50 - 74%     Transfers Chair/bed transfer  Transfers assist     Chair/bed transfer assist level: Minimal Assistance - Patient > 75%     Locomotion Ambulation   Ambulation assist      Assist level: Minimal Assistance - Patient > 75% Assistive device: Walker-rolling Max distance: 17'   Walk 10 feet activity   Assist     Assist level: Minimal Assistance - Patient > 75% Assistive device: Walker-rolling   Walk 50 feet activity   Assist Walk 50 feet with 2 turns activity did not occur: Safety/medical concerns         Walk 150 feet activity   Assist Walk 150 feet activity did not occur: Safety/medical concerns         Walk 10 feet on uneven surface  activity   Assist Walk 10 feet on uneven surfaces  activity did not occur: Safety/medical concerns         Wheelchair     Assist Is the patient using a wheelchair?: Yes Type of Wheelchair: Manual    Wheelchair assist level: Supervision/Verbal cueing Max wheelchair distance: 100'    Wheelchair 50 feet with 2 turns activity    Assist        Assist Level: Supervision/Verbal cueing   Wheelchair 150 feet activity     Assist      Assist Level: Moderate Assistance - Patient 50 - 74%   Blood pressure 128/65, pulse 79, temperature 98 F (36.7 C), temperature source Oral, resp. rate 18, height 5\' 6"  (1.676 m), weight 87.1 kg, SpO2 99%.  Medical Problem List and Plan: 1. 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   -Back corset when out of bed             -patient may shower             -ELOS/Goals: 9-14 days  Con't CIR PT and OT  D/c 4/16 2.  Antithrombotics: -DVT/anticoagulation:  Mechanical: Antiembolism stockings, thigh (TED hose) Bilateral lower extremities  -02/03/24 DVT US negative 4/8- will start Lovenox 40 mg daily since it's been 7 days since surgery- pt only walking ~ 13ft total/day             -antiplatelet therapy: N/A 3. Pain Management: Neurontin 100 mg daily and 200 mg nightly, Flexeril and oxycodone as needed             -might benefit from scheduled oxycodone prior to therapies  4/7- Pt reports pain meds work- doesn't want things changed right now- will monitor 4. Mood/Behavior/Sleep: Celexa 40 mg daily, Klonopin as needed             -antipsychotic agents: N/A -pt with anxiety about therapy tomorrow. Reassured her that team will work with her at her pace and be considerate of her pain. I also told her family could stay over with her if it helped her anxiety.  -02/03/24 per pharmacy yesterday, pt on trazodone 150mg  nightly-- but reported sleeping well so hold off on starting that for now.  4/9-  sleeping well  5. Neuropsych/cognition: This patient is capable of making decisions on her own behalf. 6. Skin/Wound Care: Routine skin checks 7. Fluids/Electrolytes/Nutrition: Routine in and outs, cont vitamins/supplements             -mild hyponatremia on recent labs- Na 133 on 02/02/24             -  f/u labs Monday   4/7- labs pending- will monitor for them 8.  Acute blood loss anemia on chronic anemia:        -pt transfused 1u PRBC today for hgb of 7.3 -fe++ supp -02/03/24 Hgb 9.1 yesterday, f/up CBC tomorrow.   4/7- Labs pending- will monitor for labs  4/9- Hb stable at 9.3 9.  History aortic stenosis/MVP.  Status post aortic valve replacement July 2024 per Dr. Cliffton Asters 10.  Hypertension.  Toprol-XL 25 mg twice daily, Lasix 40 mg daily.  Monitor with increased mobility             -controlled at present  4/7- 4/9- BP controlled- con't regimen Vitals:   02/03/24 0946 02/03/24 1443 02/03/24 1740 02/03/24 2019  BP: 122/60 (!) 145/75 129/86 (!) 107/51   02/04/24 0536 02/04/24 1119 02/04/24 1326 02/04/24 2129  BP: 108/67 114/60 (!) 153/61 113/62   02/05/24 0549 02/05/24 1449 02/05/24 1950 02/06/24 0453  BP: 117/66 134/61 109/62 128/65      11.  Hyperlipidemia.  Lipitor 80mg  daily 12.  Diabetes mellitus with peripheral neuropathy.  Latest hemoglobin A1c 9.7.                -sugars poorly controlled at present as well              -pt is on metformin XR 500mg  2 tablets daily as she takes at home             -SSI             -might need adjustments to her standing regimen  - 4/6 CBGs ok control but a little high in the mornings; will add HS SSI but ultimately might need adjustment to her metformin XR if her sugars are persistently elevated. Per pharmacy note yesterday, she was on insulin glargine 30U HS and mounjaro 7.5mg  weekly-- verify this is what she was taking, and potentially adding the glargine back will help  4/7- will add Lantus 10 units nightly (usually takes at 6pm)- took 45  units daily at home, but I don't think needs that yet based on CBG's.  4/8- Pt's Cbgs 159 this AM- still in 200's, but will increase Insulin tomorrow if stills needs it 4/9- will increase Lantus to 13 units nightly CBG (last 3)  Recent Labs    02/05/24 1702 02/05/24 2059 02/06/24 0618  GLUCAP 204* 152* 187*    14.  Restless leg syndrome.  Mirapex 1 mg nightly 15.  Obesity.  BMI 30.99.  Dietary follow-up 16. Constipation: colace BID, miralax daily  - LBM 4/3, start MiraLAX daily  4/9- LBM yesterday 17. Small abrasion ok chin  -bacitracin, she reports she is allergic to tape 18. Hypokalemia  4/8- K+ 3.4- will report KCL 40 mEq x1- and recheck Thursday     LOS: 5 days A FACE TO FACE EVALUATION WAS PERFORMED  Reshunda Strider 02/06/2024, 8:08 AM

## 2024-02-07 ENCOUNTER — Inpatient Hospital Stay (HOSPITAL_COMMUNITY)

## 2024-02-07 LAB — GLUCOSE, CAPILLARY
Glucose-Capillary: 215 mg/dL — ABNORMAL HIGH (ref 70–99)
Glucose-Capillary: 235 mg/dL — ABNORMAL HIGH (ref 70–99)
Glucose-Capillary: 258 mg/dL — ABNORMAL HIGH (ref 70–99)
Glucose-Capillary: 188 mg/dL — ABNORMAL HIGH (ref 70–99)
Glucose-Capillary: 301 mg/dL — ABNORMAL HIGH (ref 70–99)

## 2024-02-07 LAB — CBC WITH DIFFERENTIAL/PLATELET
Abs Immature Granulocytes: 0.08 10*3/uL — ABNORMAL HIGH (ref 0.00–0.07)
Basophils Absolute: 0 10*3/uL (ref 0.0–0.1)
Basophils Relative: 1 %
Eosinophils Absolute: 0.4 10*3/uL (ref 0.0–0.5)
Eosinophils Relative: 5 %
HCT: 25.9 % — ABNORMAL LOW (ref 36.0–46.0)
Hemoglobin: 8.6 g/dL — ABNORMAL LOW (ref 12.0–15.0)
Immature Granulocytes: 1 %
Lymphocytes Relative: 26 %
Lymphs Abs: 2.2 10*3/uL (ref 0.7–4.0)
MCH: 28 pg (ref 26.0–34.0)
MCHC: 33.2 g/dL (ref 30.0–36.0)
MCV: 84.4 fL (ref 80.0–100.0)
Monocytes Absolute: 0.6 10*3/uL (ref 0.1–1.0)
Monocytes Relative: 7 %
Neutro Abs: 5.2 10*3/uL (ref 1.7–7.7)
Neutrophils Relative %: 60 %
Platelets: 364 10*3/uL (ref 150–400)
RBC: 3.07 MIL/uL — ABNORMAL LOW (ref 3.87–5.11)
RDW: 13.4 % (ref 11.5–15.5)
WBC: 8.5 10*3/uL (ref 4.0–10.5)
nRBC: 0 % (ref 0.0–0.2)

## 2024-02-07 LAB — BASIC METABOLIC PANEL WITH GFR
Anion gap: 12 (ref 5–15)
BUN: 9 mg/dL (ref 8–23)
CO2: 28 mmol/L (ref 22–32)
Calcium: 8.8 mg/dL — ABNORMAL LOW (ref 8.9–10.3)
Chloride: 97 mmol/L — ABNORMAL LOW (ref 98–111)
Creatinine, Ser: 0.87 mg/dL (ref 0.44–1.00)
GFR, Estimated: >60 mL/min (ref >60)
Glucose, Bld: 222 mg/dL — ABNORMAL HIGH (ref 70–99)
Potassium: 3.9 mmol/L (ref 3.5–5.1)
Sodium: 137 mmol/L (ref 135–145)

## 2024-02-07 MED ORDER — HYDROCERIN EX CREA
TOPICAL_CREAM | Freq: Two times a day (BID) | CUTANEOUS | Status: DC
  Filled 2024-02-07: qty 113

## 2024-02-07 NOTE — Plan of Care (Signed)
  Problem: SCI BOWEL ELIMINATION Goal: RH STG MANAGE BOWEL WITH ASSISTANCE Description: STG Manage Bowel with mod I Assistance. Outcome: Progressing   Problem: SCI BLADDER ELIMINATION Goal: RH STG MANAGE BLADDER WITH ASSISTANCE Description: STG Manage Bladder With Mod I  Assistance Outcome: Progressing   Problem: RH SKIN INTEGRITY Goal: RH STG SKIN FREE OF INFECTION/BREAKDOWN Description: Manage  skin free of infection/breakdown with mod I assistance Outcome: Progressing   Problem: RH SAFETY Goal: RH STG ADHERE TO SAFETY PRECAUTIONS W/ASSISTANCE/DEVICE Description: STG Adhere to Safety Precautions With  mod I Assistance/Device. Outcome: Progressing   Problem: RH PAIN MANAGEMENT Goal: RH STG PAIN MANAGED AT OR BELOW PT'S PAIN GOAL Description: <4 w/ prns Outcome: Progressing

## 2024-02-07 NOTE — Progress Notes (Signed)
   02/07/24 2259  What Happened  Was fall witnessed? Yes  Who witnessed fall? Patient husband  Patients activity before fall ambulating-unassisted  Point of contact buttocks;head  Was patient injured? Unsure  Provider Notification  Provider Name/Title Jacalyn Lefevre NP  Date Provider Notified 02/07/24  Time Provider Notified 2319  Method of Notification Call  Notification Reason Fall  Provider response See new orders  Date of Provider Response 02/07/24  Time of Provider Response 2320  Follow Up  Family notified Yes - comment (Husband at bedside)  Time family notified 2311  Progress note created (see row info) Yes  Adult Fall Risk Assessment  Risk Factor Category (scoring not indicated) High fall risk per protocol (document High fall risk)  Adult Fall Risk Interventions  Required Bundle Interventions *See Row Information* High fall risk - low, moderate, and high requirements implemented  Fall intervention(s) refused/Patient educated regarding refusal Bed alarm;Nonskid socks  Screening for Fall Injury Risk (To be completed on HIGH fall risk patients) - Assessing Need for Floor Mats  Risk For Fall Injury- Criteria for Floor Mats Previous fall this admission  Will Implement Floor Mats Yes  Vitals  Temp 98.6 F (37 C)  BP (!) 127/57  MAP (mmHg) 76  BP Location Right Arm  BP Method Automatic  Patient Position (if appropriate) Lying  Pulse Rate 94  Pulse Rate Source Monitor  Resp 19  Oxygen Therapy  SpO2 98 %  O2 Device Room Air  Pain Assessment  Pain Scale 0-10  Pain Score 0  Neurological  Neuro (WDL) X  Orientation Level Oriented X4  Cognition Appropriate at baseline  Motor Function/Sensation Assessment Grip;Motor strength  R Hand Grip Strong  L Hand Grip Strong  R Foot Dorsiflexion Moderate  L Foot Dorsiflexion Moderate  R Foot Plantar Flexion Moderate  L Foot Plantar Flexion Moderate  RUE Motor Response Purposeful movement  RUE Sensation Full sensation  RUE  Motor Strength 5  LUE Motor Response Purposeful movement  LUE Sensation Full sensation  LUE Motor Strength 5  RLE Motor Response Purposeful movement  RLE Sensation Full sensation  RLE Motor Strength 4  LLE Motor Response Purposeful movement  LLE Sensation Full sensation  LLE Motor Strength 4  Neuro Symptoms Anxiety  Neuro symptoms relieved by Anti-anxiety medication  Neuro Additional Assessments Glasgow Coma Scale  Glasgow Coma Scale  Eye Opening 4  Best Verbal Response (NON-intubated) 5  Best Motor Response 6  Glasgow Coma Scale Score 15  Musculoskeletal  Musculoskeletal (WDL) X  Assistive Device Front wheel walker  Generalized Weakness Yes  Weight Bearing Restrictions Per Provider Order No  Musculoskeletal Details  Lower Back Injury/trauma  Lower Back Ortho/Supportive Device Back Brace  Back Brace LSO  Integumentary  Integumentary (WDL) X  Skin Color Appropriate for ethnicity  Skin Condition Dry  Itching intervention eucerin  Skin Integrity Other (Comment) (back incision)  Skin Turgor Non-tenting

## 2024-02-07 NOTE — Progress Notes (Signed)
 Pt reported she fell in her room, husband witnessed. On call  Jacalyn Lefevre notified. New stat order for CT scan of head WO contrast received. Charge nurse aware.   Dominica Severin  RN

## 2024-02-07 NOTE — Progress Notes (Signed)
 PROGRESS NOTE   Subjective/Complaints:  Pt reports  LBM 2 nights ago- feels like could go today.  Still has pain- like knot in back- last Flexeril dose yesterday at 2pm- so not often.  Itching badly at incision.   ROS:     Pt denies SOB, abd pain, CP, N/V/C/D, and vision changes   Objective:   No results found.   Recent Labs    02/04/24 0941 02/07/24 0534  WBC 8.6 8.5  HGB 9.3* 8.6*  HCT 27.8* 25.9*  PLT 322 364   Recent Labs    02/04/24 0941 02/07/24 0534  NA 133* 137  K 3.4* 3.9  CL 96* 97*  CO2 29 28  GLUCOSE 237* 222*  BUN 11 9  CREATININE 0.90 0.87  CALCIUM 9.1 8.8*    Intake/Output Summary (Last 24 hours) at 02/07/2024 0830 Last data filed at 02/06/2024 1300 Gross per 24 hour  Intake 480 ml  Output --  Net 480 ml        Physical Exam: Vital Signs Blood pressure 119/74, pulse 87, temperature 98 F (36.7 C), resp. rate 18, height 5\' 6"  (1.676 m), weight 87.1 kg, SpO2 98%.      General: awake, alert, appropriate sitting up EOB; husband at bedside;  NAD HENT: conjugate gaze; oropharynx moist CV: regular rate and rhythm; no JVD Pulmonary: CTA B/L; no W/R/R- good air movement GI: soft, NT, ND, (+)BS Psychiatric: appropriate; bright affect Neurological: Ox3 Skin- no drainage except 1 tiny spot near bottom of incision- has a little drainag eon dressing- removed since was dry-  Extremities: No clubbing, cyanosis, or edema. Pulses are 2+ Psych: Pt's affect is a little flat but pleasant  PRIOR EXAMS: Skin: Back incision Clean and intact , small abrasion on L chin Neuro:  Alert and oriented x 3. Normal insight and awareness. Intact Memory. Normal language and speech. Cranial nerve exam unremarkable. MMT: RUE 5/5. LUE 4+/5 prox to distal. RLE HF 2+/5 with pain inhibition, KE 3+, ADF/PF 4/5. LLE 2- to 2+/5 HF KE with pain component to weakness and 1+ to 2/5 ADF/PF. Decreased LT and PP bilateral  LE's below knee. Intact saddle sensation. DTR's tr-1+. No abnl resting tone. No cerebellar signs. .  Musculoskeletal: Low back tender to palpation and attempts at trunk movement. Pt wearing LSO    Assessment/Plan: 1. Functional deficits which require 3+ hours per day of interdisciplinary therapy in a comprehensive inpatient rehab setting. Physiatrist is providing close team supervision and 24 hour management of active medical problems listed below. Physiatrist and rehab team continue to assess barriers to discharge/monitor patient progress toward functional and medical goals  Care Tool:  Bathing    Body parts bathed by patient: Right arm, Left arm, Chest, Abdomen, Right upper leg, Face, Left upper leg   Body parts bathed by helper: Right lower leg, Left lower leg, Front perineal area, Buttocks     Bathing assist Assist Level: Moderate Assistance - Patient 50 - 74%     Upper Body Dressing/Undressing Upper body dressing   What is the patient wearing?: Pull over shirt    Upper body assist Assist Level: Supervision/Verbal cueing    Lower Body Dressing/Undressing  Lower body dressing      What is the patient wearing?: Pants, Underwear/pull up     Lower body assist Assist for lower body dressing: Moderate Assistance - Patient 50 - 74%     Toileting Toileting    Toileting assist Assist for toileting: Moderate Assistance - Patient 50 - 74%     Transfers Chair/bed transfer  Transfers assist     Chair/bed transfer assist level: Minimal Assistance - Patient > 75%     Locomotion Ambulation   Ambulation assist      Assist level: Minimal Assistance - Patient > 75% Assistive device: Walker-rolling Max distance: 17'   Walk 10 feet activity   Assist     Assist level: Minimal Assistance - Patient > 75% Assistive device: Walker-rolling   Walk 50 feet activity   Assist Walk 50 feet with 2 turns activity did not occur: Safety/medical concerns         Walk  150 feet activity   Assist Walk 150 feet activity did not occur: Safety/medical concerns         Walk 10 feet on uneven surface  activity   Assist Walk 10 feet on uneven surfaces activity did not occur: Safety/medical concerns         Wheelchair     Assist Is the patient using a wheelchair?: Yes Type of Wheelchair: Manual    Wheelchair assist level: Supervision/Verbal cueing Max wheelchair distance: 100'    Wheelchair 50 feet with 2 turns activity    Assist        Assist Level: Supervision/Verbal cueing   Wheelchair 150 feet activity     Assist      Assist Level: Moderate Assistance - Patient 50 - 74%   Blood pressure 119/74, pulse 87, temperature 98 F (36.7 C), resp. rate 18, height 5\' 6"  (1.676 m), weight 87.1 kg, SpO2 98%.  Medical Problem List and Plan: 1. 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   -Back corset when out of bed             -patient may shower             -ELOS/Goals: 9-14 days  D/c 4/16  Con't PT and OT /CIR 2.  Antithrombotics: -DVT/anticoagulation:  Mechanical: Antiembolism stockings, thigh (TED hose) Bilateral lower extremities  -02/03/24 DVT US negative 4/8- will start Lovenox 40 mg daily since it's been 7 days since surgery- pt only walking ~ 143ft total/day             -antiplatelet therapy: N/A 3. Pain Management: Neurontin 100 mg daily and 200 mg nightly, Flexeril and oxycodone as needed             -might benefit from scheduled oxycodone prior to therapies  4/7- Pt reports pain meds work- doesn't want things changed right now- will monitor  4/10- feels like pain manageable.  4. Mood/Behavior/Sleep: Celexa 40 mg daily, Klonopin as needed             -antipsychotic agents: N/A -pt with anxiety about therapy tomorrow. Reassured her that team will work with her at her pace and be considerate of her pain. I also  told her family could stay over with her if it helped her anxiety.  -02/03/24 per pharmacy yesterday, pt on trazodone 150mg  nightly-- but reported sleeping well so hold off on starting that for now.  4/9- sleeping well  5. Neuropsych/cognition: This patient is capable  of making decisions on her own behalf. 6. Skin/Wound Care: Routine skin checks 7. Fluids/Electrolytes/Nutrition: Routine in and outs, cont vitamins/supplements             -mild hyponatremia on recent labs- Na 133 on 02/02/24             -f/u labs Monday   4/7- labs pending- will monitor for them 8.  Acute blood loss anemia on chronic anemia:        -pt transfused 1u PRBC today for hgb of 7.3 -fe++ supp -02/03/24 Hgb 9.1 yesterday, f/up CBC tomorrow.   4/7- Labs pending- will monitor for labs  4/9- Hb stable at 9.3 9.  History aortic stenosis/MVP.  Status post aortic valve replacement July 2024 per Dr. Cliffton Asters 10.  Hypertension.  Toprol-XL 25 mg twice daily, Lasix 40 mg daily.  Monitor with increased mobility             -controlled at present  4/7- 4/9- BP controlled- con't regimen  4/10 - BP controlled-  Vitals:   02/04/24 0536 02/04/24 1119 02/04/24 1326 02/04/24 2129  BP: 108/67 114/60 (!) 153/61 113/62   02/05/24 0549 02/05/24 1449 02/05/24 1950 02/06/24 0453  BP: 117/66 134/61 109/62 128/65   02/06/24 1317 02/06/24 1824 02/07/24 0536 02/07/24 0758  BP: (!) 141/97 134/70 130/67 119/74      11.  Hyperlipidemia.  Lipitor 80mg  daily 12.  Diabetes mellitus with peripheral neuropathy.  Latest hemoglobin A1c 9.7.                -sugars poorly controlled at present as well              -pt is on metformin XR 500mg  2 tablets daily as she takes at home             -SSI             -might need adjustments to her standing regimen  - 4/6 CBGs ok control but a little high in the mornings; will add HS SSI but ultimately might need adjustment to her metformin XR if her sugars are persistently elevated. Per pharmacy note yesterday,  she was on insulin glargine 30U HS and mounjaro 7.5mg  weekly-- verify this is what she was taking, and potentially adding the glargine back will help  4/7- will add Lantus 10 units nightly (usually takes at 6pm)- took 45 units daily at home, but I don't think needs that yet based on CBG's.  4/8- Pt's Cbgs 159 this AM- still in 200's, but will increase Insulin tomorrow if stills needs it 4/9- will increase Lantus to 13 units nightly 4/10- just increased Lantus yesterday-max CBG 385 yesterday - eating snacks.  CBG (last 3)  Recent Labs    02/06/24 1610 02/06/24 2116 02/07/24 0637  GLUCAP 132* 384* 215*    14.  Restless leg syndrome.  Mirapex 1 mg nightly 15.  Obesity.  BMI 30.99.  Dietary follow-up 16. Constipation: colace BID, miralax daily  - LBM 4/3, start MiraLAX daily  4/9- LBM yesterday 17. Small abrasion ok chin  -bacitracin, she reports she is allergic to tape 18. Hypokalemia  4/8- K+ 3.4- will report KCL 40 mEq x1- and recheck Thursday  4/10- K+ 3.8  19. Incision itching  4/10- ordered Eucerin to go around incision, not on incision- and took off dressing to help itching, since skin moist- not draining.    I spent a total of 36    minutes on total care today- >50%  coordination of care- due to  D/w pt about itching, assessed incision; review of labs including CBGs- just changed Insulin, so will not change again today quite yet.  Also d/w nursing this AM   LOS: 6 days A FACE TO FACE EVALUATION WAS PERFORMED  Zamya Culhane 02/07/2024, 8:30 AM

## 2024-02-07 NOTE — Plan of Care (Signed)
  Problem: Consults Goal: RH SPINAL CORD INJURY PATIENT EDUCATION Description:  See Patient Education module for education specifics.  Outcome: Progressing   Problem: SCI BOWEL ELIMINATION Goal: RH STG MANAGE BOWEL WITH ASSISTANCE Description: STG Manage Bowel with mod I Assistance. Outcome: Progressing   Problem: SCI BLADDER ELIMINATION Goal: RH STG MANAGE BLADDER WITH ASSISTANCE Description: STG Manage Bladder With Mod I  Assistance Outcome: Progressing   Problem: RH SKIN INTEGRITY Goal: RH STG SKIN FREE OF INFECTION/BREAKDOWN Description: Manage  skin free of infection/breakdown with mod I assistance Outcome: Progressing   Problem: RH SAFETY Goal: RH STG ADHERE TO SAFETY PRECAUTIONS W/ASSISTANCE/DEVICE Description: STG Adhere to Safety Precautions With  mod I Assistance/Device. Outcome: Progressing   Problem: RH PAIN MANAGEMENT Goal: RH STG PAIN MANAGED AT OR BELOW PT'S PAIN GOAL Description: <4 w/ prns Outcome: Progressing   Problem: RH KNOWLEDGE DEFICIT SCI Goal: RH STG INCREASE KNOWLEDGE OF SELF CARE AFTER SCI Description: Manage  increase knowledge f self care after SCI with mod I assistance using educational materials provided Outcome: Progressing

## 2024-02-07 NOTE — Progress Notes (Signed)
 Nurse called: Reporting patient stood up and lost her balance, husband reporting  patient hit her head. CT without contrast ordered, nurse verbalizes  understanding.

## 2024-02-07 NOTE — Progress Notes (Signed)
 Physical Therapy Session Note  Patient Details  Name: Stacy Frost MRN: [id number] Date of Birth: 02-29-1961  Today's Date: 02/07/2024 PT Individual Time: 0805-0900, 1000-1113  PT Individual Time Calculation (min): 55 min , 73 min   Short Term Goals: Week 1:  PT Short Term Goal 1 (Week 1): STG=LTG due to ELOS  Skilled Therapeutic Interventions/Progress Updates:      Therapy Documentation Precautions:  Precautions Precautions: Back, Fall Recall of Precautions/Restrictions: Impaired (Unable to recall "lift") Precaution/Restrictions Comments: Verbally reviewed precautions, re-education on acronym BLT. Required Braces or Orthoses: Spinal Brace Spinal Brace: Lumbar corset, Applied in sitting position Restrictions Weight Bearing Restrictions Per Provider Order: No   Treatment Session 1:   Pt requests assistance with shower, no reports of pain, Nurse case manager present to apply dry gauze and Tegaderm to incision. CGA/close supervision with ambulatory transfer to toilet and supervision for bathing. Pt min A for lower body dressing due to L LE weakness, requires increased time. CGA for dynamic sitting and standing balance as pt performed grooming tasks at sink. Dependent transport by w/c to main gym to attempt stair training 6" step, deferred due to weakness with first attempt. PT educated pt to avoid mobility with fatigue for safety. Pt returned to room and left seated at bedside all needs in reach.    Treatment Session 2:   Pt agreeable to PT session with emphasis on global strength and activity tolerance training. Dependent transport by w/c to main gym pt navigated 6" steps x 8 with B UE on left rail and side step technique with min A for recovery following L LE buckle. Plan for pt to install rails for home entry. Pt ambulated 175' rollator without left knee buckle and cues for rollator management (lock/unlock). Pt performed following exercises for active recovery as pt presents with  increased fatigue:   -right LAQ x 6   -right LAQ + isometric hold x 3 sec x 6   -right hip flexion x 6   -bicep curls 7# 2 x 10   -OH press 7# 2 x 10   -chest press 7# 2 x 10   Pt returned to room and left seated at bedside, all needs in reach. Discussed plan for pt's spouse to return home measurement sheet to prepare for discharge.   Therapy/Group: Individual Therapy  Truitt Leep Truitt Leep PT, DPT  02/07/2024, 1:31 PM

## 2024-02-07 NOTE — Progress Notes (Signed)
 Occupational Therapy Session Note  Patient Details  Name: Stacy Frost MRN: [id number] Date of Birth: 30-Nov-1960  Today's Date: 02/07/2024 OT Individual Time: 1300-1415 OT Individual Time Calculation (min): 75 min    Short Term Goals: Week 1:  OT Short Term Goal 1 (Week 1): STGs=LTGs due to patient's estimated length of stay.  Skilled Therapeutic Interventions/Progress Updates:      Therapy Documentation Precautions:  Precautions Precautions: Back, Fall Recall of Precautions/Restrictions: Impaired (Unable to recall "lift") Precaution/Restrictions Comments: Verbally reviewed precautions, re-education on acronym BLT. Required Braces or Orthoses: Spinal Brace Spinal Brace: Lumbar corset, Applied in sitting position Restrictions Weight Bearing Restrictions Per Provider Order: No General: "Hello!" Pt seated in W/C upon OT arrival, agreeable to OT.  Pain:  8/10 pain reported in low back, activity, intermittent rest breaks, distractions provided for pain management, pt reports tolerable to proceed.   ADL: OT providing skilled intervention on functional mobility. Pt able to ambulate with rollator from room><day room without seated rest breaks required. Pt reporting fatigue at end of ambulation requiring rest break once in room.   Exercises: Pt completed the following exercise circuit in order to improve functional activity, strength and endurance to prepare for ADLs such as bathing. Pt completed the following exercises in seated position with no noted LOB/SOB and 3x10 repetitions on each exercise with green theraband: -knee extension, AAROM assistance with LLE from OT with emphasis on eccentric contraction -leg curls  -sit to stands with no UE support, emphasis on eccentric muscle contraction to lower into seated position -hip abduction   Pt seated in W/C at end of session with W/C alarm donned, call light within reach and 4Ps assessed.    Therapy/Group: Individual Therapy Velia Meyer, OTD, OTR/L 02/07/2024, 4:35 PM

## 2024-02-08 ENCOUNTER — Encounter: Payer: 59 | Admitting: Vascular Surgery

## 2024-02-08 ENCOUNTER — Encounter (HOSPITAL_COMMUNITY): Payer: 59

## 2024-02-08 ENCOUNTER — Inpatient Hospital Stay (HOSPITAL_COMMUNITY)

## 2024-02-08 LAB — GLUCOSE, CAPILLARY
Glucose-Capillary: 224 mg/dL — ABNORMAL HIGH (ref 70–99)
Glucose-Capillary: 246 mg/dL — ABNORMAL HIGH (ref 70–99)
Glucose-Capillary: 276 mg/dL — ABNORMAL HIGH (ref 70–99)
Glucose-Capillary: 282 mg/dL — ABNORMAL HIGH (ref 70–99)
Glucose-Capillary: 294 mg/dL — ABNORMAL HIGH (ref 70–99)

## 2024-02-08 MED ORDER — BUSPIRONE HCL 10 MG PO TABS
5.0000 mg | ORAL_TABLET | Freq: Three times a day (TID) | ORAL | Status: DC | PRN
  Administered 2024-02-08 – 2024-02-11 (×5): 10 mg via ORAL
  Filled 2024-02-08 (×6): qty 1

## 2024-02-08 MED ORDER — INSULIN GLARGINE-YFGN 100 UNIT/ML ~~LOC~~ SOLN
18.0000 [IU] | Freq: Every day | SUBCUTANEOUS | Status: DC
  Administered 2024-02-08: 18 [IU] via SUBCUTANEOUS
  Filled 2024-02-08 (×2): qty 0.18

## 2024-02-08 NOTE — Progress Notes (Signed)
 [place] self in and out of room. Expressing frustration towards their night nurse placing blame for them having the safety belt on. Expressed the safety belt affecting their anxiety and making it worse. Continues to rub her legs as a self-soothing action. Continues to endorse sensation of her legs having a crawling like feel. Wanting to be discharged and having difficulty adapting to being in her room. Did endorse make an effort to find activity to keep herself distracted. Multiple psychosocial factors occurring at this time. Dynamics with her significant other and a child of hers. Endorsing PRN anxiety medication given last night did not relieve anxiety feeling. Openness to taking new PRN medication for anxiety and claustrophobia.

## 2024-02-08 NOTE — Progress Notes (Signed)
 Physical Therapy Weekly Progress Note  Patient Details  Name: Stacy Frost MRN: [id number] Date of Birth: 1961/06/11  Beginning of progress report period: February 02, 2024 End of progress report period: February 08, 2024  Patient did not have STG set due to initial ELOS. Pt is progressing towards overall LTGs which are overall mod I to supervision. Pt is currently at [facility] overall using rollator and min assist for stair negotiation. Requires assist with donning of LSO and cues for back precautions. Stairs are the biggest barrier at this time due to pt with L knee instability but it has been improving. Will continue to focus on d/c planning, stairs, endurance/strength, and family education with planned d/c for 4/16  Patient continues to demonstrate the following deficits muscle weakness and muscle joint tightness, decreased cardiorespiratoy endurance, and decreased standing balance, decreased balance strategies, and difficulty maintaining precautions and therefore will continue to benefit from skilled PT intervention to increase functional independence with mobility.  Patient progressing toward long term goals..  Continue plan of care. Discharged w/c mobility goal due to primary ambulator and downgraded stair goal to CGA due to balance/instability.   PT Short Term Goals Week 1:  PT Short Term Goal 1 (Week 1): STG=LTG due to ELOS PT Short Term Goal 1 - Progress (Week 1): Progressing toward goal Week 2:  PT Short Term Goal 1 (Week 2): = LTGs  Skilled Therapeutic Interventions/Progress Updates:  Ambulation/gait training;Balance/vestibular training;Disease management/prevention;Patient/family education;Neuromuscular re-education;Stair training;Functional mobility training;DME/adaptive equipment instruction;Discharge planning;Therapeutic Activities;UE/LE Strength taining/ROM;Therapeutic Exercise;Pain management;Community reintegration;Psychosocial support;Skin care/wound management;Splinting/orthotics;UE/LE  Coordination activities;Wheelchair propulsion/positioning   Therapy Documentation Precautions:  Precautions Precautions: Back, Fall Recall of Precautions/Restrictions: Impaired (Unable to recall "lift") Precaution/Restrictions Comments: Verbally reviewed precautions, re-education on acronym BLT. Required Braces or Orthoses: Spinal Brace Spinal Brace: Lumbar corset, Applied in sitting position Restrictions Weight Bearing Restrictions Per Provider Order: (P) No    Delorise Royals, PT, DPT, CBIS  02/08/2024, 2:52 PM

## 2024-02-08 NOTE — Progress Notes (Signed)
 Occupational Therapy Session Note  Patient Details  Name: Stacy Frost MRN: [id number] Date of Birth: 19-Nov-1960  Today's Date: 02/08/2024 OT Individual Time: 4782-9562 OT Individual Time Calculation (min): 58 min    Short Term Goals: Week 1:  OT Short Term Goal 1 (Week 1): STGs=LTGs due to patient's estimated length of stay.  Skilled Therapeutic Interventions/Progress Updates:    Pt received in w/c with 8/10 back pain, shower used as pain intervention. She completed functional mobility with the RW into the bathroom and transferred into shower with CGA, good RW management/sequencing for safety. She doffed all clothing, requiring assist for L sock only. She bathed UB seated with set up assist.  Good carryover for use of LH sponge and razor in shower. She completed LB with lateral leans with (S). She completed functional mobility back to the w/c following. She completed UB dressing with (S), LB with use of reacher, sit <> stand with CGA. She completed seated level hair care and grooming tasks with set up assist. Retrieved a sock aid to provide demo to pt, she was able to return demo with (S). Pt was left sitting up in the w/c with all needs met, chair alarm set, and call bell within reach.    Therapy Documentation Precautions:  Precautions Precautions: Back, Fall Recall of Precautions/Restrictions: Impaired (Unable to recall "lift") Precaution/Restrictions Comments: Verbally reviewed precautions, re-education on acronym BLT. Required Braces or Orthoses: Spinal Brace Spinal Brace: Lumbar corset, Applied in sitting position Restrictions Weight Bearing Restrictions Per Provider Order: No  Therapy/Group: Individual Therapy  Crissie Reese 02/08/2024, 6:21 AM

## 2024-02-08 NOTE — Progress Notes (Signed)
 Physical Therapy Session Note  Patient Details  Name: Stacy Frost MRN: [id number] Date of Birth: 16-Feb-1961  Today's Date: 02/08/2024 PT Individual Time: 1420-1530 PT Individual Time Calculation (min): 70 min   Short Term Goals: Week 1:  PT Short Term Goal 1 (Week 1): STG=LTG due to ELOS PT Short Term Goal 1 - Progress (Week 1): Progressing toward goal  Skilled Therapeutic Interventions/Progress Updates:  Pt presents in room in [facility] about to ambulate to bathroom with nursing staff, handoff from NT. Pt agreeable to PT. Pt reporting increased throbbing pain in BLEs this session, RN notified and provides medication during session. Pt reporting feeling stressed and emotional about being in hospital, reports feeling claustrophobia with chair alarm belt as well as with door shut. Therapist provides therapeutic use of self to address concerns, provided with chair pad alarm instead of posey belt and sign placed on door to leave door open. Much of session focused on providing comfort measures to decrease anxiety and stress around hospital stay and fall last night, as well as to try to reduce BLE pain. Session also focused on transfer training, therapeutic exercise for decreasing pain, and gait training for device management, tolerance to upright as well as to reduce BLE pain. Pt completes transfers with rollator and without with CGA, pt has tendency to place rollator to side and attempt to transfer without rollator, provided with cues to keep rollator with her during transfer. Pt reporting feeling that rollator was a possible tripping hazard that could have led to fall last night, pt instructed to use RW in room and reserve rollator for long distance ambulation with pt verbalizing understanding. Pt completes 5 minutes continuous strengthening on nustep with BLEs, pt reporting increase in pain. Pt then compeltes bed mobility with supervision sit to supine and therapist provides gentle stretching for BLEs (hip  adductor stretch and hamstring stretch) to relieve pain with pt demonstrating increasing LLE muscle spasms. Pt demonstrating rocking behavior in response to pain while in sitting, unable to remain sitting still. Pt provided with moist heat for 5 min over LLE to decrease spasm and pain with pt reporting slight improvement in pain. Pt then ambulates on unit 2x5 minutes with rollator, with one seated rest break on rollator with cues for parking rollator against wall prior to sitting, requires CGA for gait with x2 instance L knee buckling however pt able to self correct balance, noted trendelenberg gait with L stance phase. Pt uses bathroom twice during session, continent and charted, able to manage 3/3 toileting tasks with CGA. Pt ambulates back to room with CGA with rollator and returns to sitting in [facility] with all needs within reach, call light in place, chair alarm activated at end of session.  Therapy Documentation Precautions:  Precautions Precautions: Back, Fall Recall of Precautions/Restrictions: Impaired (Unable to recall "lift") Precaution/Restrictions Comments: Verbally reviewed precautions, re-education on acronym BLT. Required Braces or Orthoses: Spinal Brace Spinal Brace: Lumbar corset, Applied in sitting position Restrictions Weight Bearing Restrictions Per Provider Order: (P) No    Therapy/Group: Individual Therapy  Edwin Cap PT, DPT 02/08/2024, 4:52 PM

## 2024-02-08 NOTE — Progress Notes (Signed)
 Given 10 mg of oxyCODONE 10 mg at 845 for pain of "8 out of 10". Wasted 5 mg oxyCODONE 5 mg due to falling on the floor.

## 2024-02-08 NOTE — Plan of Care (Signed)
  Problem: SCI BOWEL ELIMINATION Goal: RH STG MANAGE BOWEL WITH ASSISTANCE Description: STG Manage Bowel with mod I Assistance. Outcome: Progressing   Problem: RH SKIN INTEGRITY Goal: RH STG SKIN FREE OF INFECTION/BREAKDOWN Description: Manage  skin free of infection/breakdown with mod I assistance Outcome: Progressing   Problem: RH SAFETY Goal: RH STG ADHERE TO SAFETY PRECAUTIONS W/ASSISTANCE/DEVICE Description: STG Adhere to Safety Precautions With  mod I Assistance/Device. Outcome: Progressing   Problem: RH PAIN MANAGEMENT Goal: RH STG PAIN MANAGED AT OR BELOW PT'S PAIN GOAL Description: <4 w/ prns Outcome: Progressing

## 2024-02-08 NOTE — Progress Notes (Signed)
 Patient A&O x 4. Endorsing increase in pain this morning. Does continue to demonstrate forgetfulness at times. Especially with regards to medications and difficulty recalling conversations from the day prior.

## 2024-02-08 NOTE — Progress Notes (Signed)
 Physical Therapy Session Note  Patient Details  Name: Stacy Frost MRN: [id number] Date of Birth: 03/15/1961  Today's Date: 02/08/2024 PT Individual Time: 0825-0930 PT Individual Time Calculation (min): 65 min   Short Term Goals: Week 1:  PT Short Term Goal 1 (Week 1): STG=LTG due to ELOS  Skilled Therapeutic Interventions/Progress Updates:    Pt presents in bed and reviewing events of fall last night. Discussed overall learning from the experience, she does not feel like it was from her leg giving out but does feel frustrated overall and worried it will impact her progress. Emotional support provided and encouragement throughout session to take it slow today as pt reporting being overall sore.   Supervision for bed mobility to come to EOB adhering to back precautions. PT donned socks and shoes for time management. CGA for transfer for sit > stand with rollator with cues for technique. Functional gait training on unit with CGA overall with trendelenberg gait pattern noted x 150' each direction.  Seated and standing therex for functional strengthening for overall mobility and transfers. Cues for activation of core musculature an exhale on exertion 4# on R and 2# on L: Seated LAQ x 10 reps each BLE with 5 sec hold Seated hip marches x 10 reps each BLE Standing mini squats x 10 reps Standing heel raises x 10 reps Standing toe raises x 10 reps  Pt reported need to use bathroom so returned to room via rollator and performed toileting with CGA for transfers and balance. Pt reporting significant fatigue overall and required seated rest break. Gave patient options for continued therapy rest of session if pt did not feel like she could do standing/gait anymore. Agreed upon Nustep with transport down to gym via w/c instead of walking. Transferred with CGA overall with rollator and performed overall strengthening and endurance training on level 2 x 5 min at self selected pace. Returned back to room with  all needs in reach and pt pleased with her ability to participate in this session.   Therapy Documentation Precautions:  Precautions Precautions: Back, Fall Recall of Precautions/Restrictions: Impaired (Unable to recall "lift") Precaution/Restrictions Comments: Verbally reviewed precautions, re-education on acronym BLT. Required Braces or Orthoses: Spinal Brace Spinal Brace: Lumbar corset, Applied in sitting position Restrictions Weight Bearing Restrictions Per Provider Order: No  Pain: Pain Assessment Pain Scale: 0-10 Pain Score: 8  Pain Type: Acute pain Pain Location: Back Pain Intervention(s): Medication (See eMAR) Medication given at start of session.     Therapy/Group: Individual Therapy  Karolee Stamps Darrol Poke, PT, DPT, CBIS  02/08/2024, 10:00 AM

## 2024-02-08 NOTE — Progress Notes (Addendum)
 PROGRESS NOTE   Subjective/Complaints:  Pt's LBM 2 days ago.  Had a fall last night when got claustrophobic and then got feet tangled up and then fell- wasn't confused or forgetful.  Hit tailbone which is real sore this AM and per husband hit head- pt said she touched the door with her head, but not "hit it".   Claustrophobia has been a big issue for her and her anxiety is "taking over'. Asking for something to help.    ROS:    Pt denies SOB, abd pain, CP, N/V/C/D, and vision changes    Objective:   CT HEAD WO CONTRAST ( ) Result Date: 02/08/2024 CLINICAL DATA:  Initial evaluation for acute head trauma, fall. EXAM: CT HEAD WITHOUT CONTRAST TECHNIQUE: Contiguous axial images were obtained from the base of the skull through the vertex without intravenous contrast. RADIATION DOSE REDUCTION: This exam was performed according to the departmental dose-optimization program which includes automated exposure control, adjustment of the mA and/or kV according to patient size and/or use of iterative reconstruction technique. COMPARISON:  Prior study from 10/19/2011. FINDINGS: Brain: Cerebral volume within normal limits for patient age. No acute intracranial hemorrhage. No acute large vessel territory infarct. No mass lesion, midline shift, or mass effect. Ventricles are normal in size without hydrocephalus. No extra-axial fluid collection. Vascular: No abnormal hyperdense vessel. Skull: Scalp soft tissues demonstrate no acute abnormality. Calvarium intact. Sinuses/Orbits: Globes and orbital soft tissues within normal limits. Visualized paranasal sinuses are largely clear. No significant mastoid effusion. IMPRESSION: Normal head CT. No acute intracranial abnormality. Electronically Signed   By: Rise Mu M.D.   On: 02/08/2024 01:34     Recent Labs    02/07/24 0534  WBC 8.5  HGB 8.6*  HCT 25.9*  PLT 364   Recent Labs     02/07/24 0534  NA 137  K 3.9  CL 97*  CO2 28  GLUCOSE 222*  BUN 9  CREATININE 0.87  CALCIUM 8.8*    Intake/Output Summary (Last 24 hours) at 02/08/2024 0900 Last data filed at 02/08/2024 0757 Gross per 24 hour  Intake 720 ml  Output --  Net 720 ml        Physical Exam: Vital Signs Blood pressure 121/74, pulse 86, temperature 98 F (36.7 C), resp. rate 20, height 5\' 6"  (1.676 m), weight 87.1 kg, SpO2 95%.       General: awake, alert, appropriate, sitting up in bed today; NAD HENT: conjugate gaze; oropharynx moist CV: regular rate and rhythm; no JVD Pulmonary: CTA B/L; no W/R/R- good air movement GI: soft, NT, ND, (+)BS Psychiatric: appropriate but much more anxious this AM Neurological: Ox3  Skin- no drainage except 1 tiny spot near bottom of incision- has a little drainag eon dressing- removed since was dry-  Extremities: No clubbing, cyanosis, or edema. Pulses are 2+ Psych: Pt's affect is a little flat but pleasant  PRIOR EXAMS: Skin: Back incision Clean and intact , small abrasion on L chin Neuro:  Alert and oriented x 3. Normal insight and awareness. Intact Memory. Normal language and speech. Cranial nerve exam unremarkable. MMT: RUE 5/5. LUE 4+/5 prox to distal. RLE HF 2+/5 with pain inhibition,  KE 3+, ADF/PF 4/5. LLE 2- to 2+/5 HF KE with pain component to weakness and 1+ to 2/5 ADF/PF. Decreased LT and PP bilateral LE's below knee. Intact saddle sensation. DTR's tr-1+. No abnl resting tone. No cerebellar signs. .  Musculoskeletal: Low back tender to palpation and attempts at trunk movement. Pt wearing LSO    Assessment/Plan: 1. Functional deficits which require 3+ hours per day of interdisciplinary therapy in a comprehensive inpatient rehab setting. Physiatrist is providing close team supervision and 24 hour management of active medical problems listed below. Physiatrist and rehab team continue to assess barriers to discharge/monitor patient progress toward  functional and medical goals  Care Tool:  Bathing    Body parts bathed by patient: Right arm, Left arm, Chest, Abdomen, Right upper leg, Face, Left upper leg   Body parts bathed by helper: Right lower leg, Left lower leg, Front perineal area, Buttocks     Bathing assist Assist Level: Moderate Assistance - Patient 50 - 74%     Upper Body Dressing/Undressing Upper body dressing   What is the patient wearing?: Pull over shirt    Upper body assist Assist Level: Supervision/Verbal cueing    Lower Body Dressing/Undressing Lower body dressing      What is the patient wearing?: Pants, Underwear/pull up     Lower body assist Assist for lower body dressing: Moderate Assistance - Patient 50 - 74%     Toileting Toileting    Toileting assist Assist for toileting: Moderate Assistance - Patient 50 - 74%     Transfers Chair/bed transfer  Transfers assist     Chair/bed transfer assist level: Minimal Assistance - Patient > 75%     Locomotion Ambulation   Ambulation assist      Assist level: Minimal Assistance - Patient > 75% Assistive device: Walker-rolling Max distance: 17'   Walk 10 feet activity   Assist     Assist level: Minimal Assistance - Patient > 75% Assistive device: Walker-rolling   Walk 50 feet activity   Assist Walk 50 feet with 2 turns activity did not occur: Safety/medical concerns         Walk 150 feet activity   Assist Walk 150 feet activity did not occur: Safety/medical concerns         Walk 10 feet on uneven surface  activity   Assist Walk 10 feet on uneven surfaces activity did not occur: Safety/medical concerns         Wheelchair     Assist Is the patient using a wheelchair?: Yes Type of Wheelchair: Manual    Wheelchair assist level: Supervision/Verbal cueing Max wheelchair distance: 100'    Wheelchair 50 feet with 2 turns activity    Assist        Assist Level: Supervision/Verbal cueing    Wheelchair 150 feet activity     Assist      Assist Level: Moderate Assistance - Patient 50 - 74%   Blood pressure 121/74, pulse 86, temperature 98 F (36.7 C), resp. rate 20, height 5\' 6"  (1.676 m), weight 87.1 kg, SpO2 95%.  Medical Problem List and Plan: 1. 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   -Back corset when out of bed             -patient may shower             -ELOS/Goals: 9-14 days  D/c 4/16  Con't CIR PT and OT  Pt  fell last night- head CT (-)- just sore this AM- will monitor- d/w nursing- don't think she needs telesitter- just got feet tangled. But will need posey belt I think per policy.  2.  Antithrombotics: -DVT/anticoagulation:  Mechanical: Antiembolism stockings, thigh (TED hose) Bilateral lower extremities  -02/03/24 DVT US negative 4/8- will start Lovenox 40 mg daily since it's been 7 days since surgery- pt only walking ~ 118ft total/day             -antiplatelet therapy: N/A 3. Pain Management: Neurontin 100 mg daily and 200 mg nightly, Flexeril and oxycodone as needed             -might benefit from scheduled oxycodone prior to therapies  4/7- Pt reports pain meds work- doesn't want things changed right now- will monitor  4/10- feels like pain manageable. 4/11- Pt's buttocks are sore/painful and back still hurting- con't regimen- but might need things increased over weekend  4. Mood/Behavior/Sleep: Celexa 40 mg daily, Klonopin as needed             -antipsychotic agents: N/A -pt with anxiety about therapy tomorrow. Reassured her that team will work with her at her pace and be considerate of her pain. I also told her family could stay over with her if it helped her anxiety.  -02/03/24 per pharmacy yesterday, pt on trazodone 150mg  nightly-- but reported sleeping well so hold off on starting that for now.  4/9- sleeping well  4/11- start Buspar 5-10  mg TID prn to help with claustrophobia and anxiety- if that doesn't work, has Klonopin-  5. Neuropsych/cognition: This patient is capable of making decisions on her own behalf. 6. Skin/Wound Care: Routine skin checks 7. Fluids/Electrolytes/Nutrition: Routine in and outs, cont vitamins/supplements             -mild hyponatremia on recent labs- Na 133 on 02/02/24             -f/u labs Monday   4/7- labs pending- will monitor for them 8.  Acute blood loss anemia on chronic anemia:        -pt transfused 1u PRBC today for hgb of 7.3 -fe++ supp -02/03/24 Hgb 9.1 yesterday, f/up CBC tomorrow.   4/7- Labs pending- will monitor for labs  4/9- Hb stable at 9.3 9.  History aortic stenosis/MVP.  Status post aortic valve replacement July 2024 per Dr. Cliffton Asters 10.  Hypertension.  Toprol-XL 25 mg twice daily, Lasix 40 mg daily.  Monitor with increased mobility             -controlled at present  4/7- 4/9- BP controlled- con't regimen  4/10 - 4/11 overall BP controlled-  Vitals:   02/07/24 1731 02/07/24 1949 02/07/24 2259 02/07/24 2330  BP: 132/70 136/82 (!) 127/57 (!) 120/58   02/08/24 0015 02/08/24 0058 02/08/24 0130 02/08/24 0233  BP: 111/82 (!) 143/71 121/68 119/60   02/08/24 0342 02/08/24 0442 02/08/24 0608 02/08/24 0845  BP: 139/75 (!) 158/54 131/69 121/74      11.  Hyperlipidemia.  Lipitor 80mg  daily 12.  Diabetes mellitus with peripheral neuropathy.  Latest hemoglobin A1c 9.7.                -sugars poorly controlled at present as well              -pt is on metformin XR 500mg  2 tablets daily as she takes at home             -SSI             -  might need adjustments to her standing regimen  - 4/6 CBGs ok control but a little high in the mornings; will add HS SSI but ultimately might need adjustment to her metformin XR if her sugars are persistently elevated. Per pharmacy note yesterday, she was on insulin glargine 30U HS and mounjaro 7.5mg  weekly-- verify this is what she was taking, and  potentially adding the glargine back will help  4/7- will add Lantus 10 units nightly (usually takes at 6pm)- took 45 units daily at home, but I don't think needs that yet based on CBG's.  4/8- Pt's Cbgs 159 this AM- still in 200's, but will increase Insulin tomorrow if stills needs it 4/9- will increase Lantus to 13 units nightly 4/10- just increased Lantus yesterday-max CBG 385 yesterday - eating snacks.  4/11- Will increase Lantus to 18 units- since mainly in 200's300s lately- pt was on 45 units at home. Might need titration this weekend CBG (last 3)  Recent Labs    02/07/24 2107 02/08/24 0015 02/08/24 0610  GLUCAP 301* 246* 276*    14.  Restless leg syndrome.  Mirapex 1 mg nightly 15.  Obesity.  BMI 30.99.  Dietary follow-up 16. Constipation: colace BID, miralax daily  - LBM 4/3, start MiraLAX daily  4/9- LBM yesterday  4/11- LBM 2 days ago 17. Small abrasion ok chin  -bacitracin, she reports she is allergic to tape 18. Hypokalemia  4/8- K+ 3.4- will report KCL 40 mEq x1- and recheck Thursday  4/10- K+ 3.8  19. Incision itching  4/10- ordered Eucerin to go around incision, not on incision- and took off dressing to help itching, since skin moist- not draining.  20. Claustrophobia  4/11- Will add Buspar 5-10 mg TID prn   I spent a total of 47   minutes on total care today- >50% coordination of care- due to  D/w pt at length about pain/fall and anxiety- also d/w 2 different nurses about not needing a telesitter- but would do posey belt.    Of note, will NOT need f/u with me- just surgeon-   LOS: 7 days A FACE TO FACE EVALUATION WAS PERFORMED  Clive Parcel 02/08/2024, 9:00 AM

## 2024-02-09 LAB — GLUCOSE, CAPILLARY
Glucose-Capillary: 290 mg/dL — ABNORMAL HIGH (ref 70–99)
Glucose-Capillary: 208 mg/dL — ABNORMAL HIGH (ref 70–99)
Glucose-Capillary: 173 mg/dL — ABNORMAL HIGH (ref 70–99)
Glucose-Capillary: 350 mg/dL — ABNORMAL HIGH (ref 70–99)

## 2024-02-09 MED ORDER — INSULIN GLARGINE-YFGN 100 UNIT/ML ~~LOC~~ SOLN
22.0000 [IU] | Freq: Every day | SUBCUTANEOUS | Status: DC
  Administered 2024-02-09 – 2024-02-10 (×2): 22 [IU] via SUBCUTANEOUS
  Filled 2024-02-09 (×3): qty 0.22

## 2024-02-09 NOTE — Progress Notes (Signed)
 PROGRESS NOTE   Subjective/Complaints:  Pt doing well, slept well, pain well managed, LBM last night, urinating fine. Denies any other complaints or concerns today.    ROS:    Pt denies SOB, abd pain, CP, N/V/C/D, and vision changes    Objective:   CT HEAD WO CONTRAST ( ) Result Date: 02/08/2024 CLINICAL DATA:  Initial evaluation for acute head trauma, fall. EXAM: CT HEAD WITHOUT CONTRAST TECHNIQUE: Contiguous axial images were obtained from the base of the skull through the vertex without intravenous contrast. RADIATION DOSE REDUCTION: This exam was performed according to the departmental dose-optimization program which includes automated exposure control, adjustment of the mA and/or kV according to patient size and/or use of iterative reconstruction technique. COMPARISON:  Prior study from 10/19/2011. FINDINGS: Brain: Cerebral volume within normal limits for patient age. No acute intracranial hemorrhage. No acute large vessel territory infarct. No mass lesion, midline shift, or mass effect. Ventricles are normal in size without hydrocephalus. No extra-axial fluid collection. Vascular: No abnormal hyperdense vessel. Skull: Scalp soft tissues demonstrate no acute abnormality. Calvarium intact. Sinuses/Orbits: Globes and orbital soft tissues within normal limits. Visualized paranasal sinuses are largely clear. No significant mastoid effusion. IMPRESSION: Normal head CT. No acute intracranial abnormality. Electronically Signed   By: Virgia Griffins M.D.   On: 02/08/2024 01:34     Recent Labs    02/07/24 0534  WBC 8.5  HGB 8.6*  HCT 25.9*  PLT 364   Recent Labs    02/07/24 0534  NA 137  K 3.9  CL 97*  CO2 28  GLUCOSE 222*  BUN 9  CREATININE 0.87  CALCIUM 8.8*    Intake/Output Summary (Last 24 hours) at 02/09/2024 1103 Last data filed at 02/09/2024 0723 Gross per 24 hour  Intake 537 ml  Output --  Net 537 ml         Physical Exam: Vital Signs Blood pressure 114/84, pulse 89, temperature 97.7 F (36.5 C), resp. rate 18, height 5\' 6"  (1.676 m), weight 84.7 kg, SpO2 100%.   General: awake, alert, appropriate, resting in bed today; NAD HENT: conjugate gaze; oropharynx moist CV: regular rate and rhythm; no JVD, valvular murmur, no rub/gallops Pulmonary: CTA B/L; no W/R/R- good air movement GI: soft, NT, ND, (+)BS Psychiatric: appropriate and less anxious Neurological: Ox3 Extremities: No clubbing, cyanosis, or edema. Pulses are 2+ Skin- no drainage except 1 tiny spot near bottom of incision- has a little drainag eon dressing- removed since was dry- not reassessed today   PRIOR EXAMS: Skin: Back incision Clean and intact , small abrasion on L chin Neuro:  Alert and oriented x 3. Normal insight and awareness. Intact Memory. Normal language and speech. Cranial nerve exam unremarkable. MMT: RUE 5/5. LUE 4+/5 prox to distal. RLE HF 2+/5 with pain inhibition, KE 3+, ADF/PF 4/5. LLE 2- to 2+/5 HF KE with pain component to weakness and 1+ to 2/5 ADF/PF. Decreased LT and PP bilateral LE's below knee. Intact saddle sensation. DTR's tr-1+. No abnl resting tone. No cerebellar signs. .  Musculoskeletal: Low back tender to palpation and attempts at trunk movement. Pt wearing LSO    Assessment/Plan: 1. Functional deficits which require 3+  hours per day of interdisciplinary therapy in a comprehensive inpatient rehab setting. Physiatrist is providing close team supervision and 24 hour management of active medical problems listed below. Physiatrist and rehab team continue to assess barriers to discharge/monitor patient progress toward functional and medical goals  Care Tool:  Bathing    Body parts bathed by patient: Right arm, Left arm, Chest, Abdomen, Right upper leg, Face, Left upper leg   Body parts bathed by helper: Right lower leg, Left lower leg, Front perineal area, Buttocks     Bathing assist  Assist Level: Moderate Assistance - Patient 50 - 74%     Upper Body Dressing/Undressing Upper body dressing   What is the patient wearing?: Pull over shirt    Upper body assist Assist Level: Supervision/Verbal cueing    Lower Body Dressing/Undressing Lower body dressing      What is the patient wearing?: Pants, Underwear/pull up     Lower body assist Assist for lower body dressing: Moderate Assistance - Patient 50 - 74%     Toileting Toileting    Toileting assist Assist for toileting: Moderate Assistance - Patient 50 - 74%     Transfers Chair/bed transfer  Transfers assist     Chair/bed transfer assist level: Contact Guard/Touching assist     Locomotion Ambulation   Ambulation assist      Assist level: Contact Guard/Touching assist Assistive device: Rollator Max distance: 150'   Walk 10 feet activity   Assist     Assist level: Contact Guard/Touching assist Assistive device: Rollator   Walk 50 feet activity   Assist Walk 50 feet with 2 turns activity did not occur: Safety/medical concerns  Assist level: Contact Guard/Touching assist Assistive device: Rollator    Walk 150 feet activity   Assist Walk 150 feet activity did not occur: Safety/medical concerns  Assist level: Contact Guard/Touching assist Assistive device: Rollator    Walk 10 feet on uneven surface  activity   Assist Walk 10 feet on uneven surfaces activity did not occur: Safety/medical concerns         Wheelchair     Assist Is the patient using a wheelchair?: Yes Type of Wheelchair: Manual    Wheelchair assist level: Supervision/Verbal cueing Max wheelchair distance: 100'    Wheelchair 50 feet with 2 turns activity    Assist        Assist Level: Supervision/Verbal cueing   Wheelchair 150 feet activity     Assist      Assist Level: Moderate Assistance - Patient 50 - 74%   Blood pressure 114/84, pulse 89, temperature 97.7 F (36.5 C), resp.  rate 18, height 5\' 6"  (1.676 m), weight 84.7 kg, SpO2 100%.  Medical Problem List and Plan: 1. 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   -Back corset when out of bed             -patient may shower             -ELOS/Goals: 9-14 days  D/c 4/16  Con't CIR PT and OT Pt fell last night- head CT (-)- just sore this AM- will monitor- d/w nursing- don't think she needs telesitter- just got feet tangled. But will need posey belt I think per policy.  -of note, will not need f/up with me, only surgeon 2.  Antithrombotics: -DVT/anticoagulation:  Mechanical: Antiembolism stockings, thigh (TED hose) Bilateral lower extremities  -02/03/24 DVT US  negative 4/8- will start Lovenox 40 mg  daily since it's been 7 days since surgery- pt only walking ~ 117ft total/day             -antiplatelet therapy: N/A 3. Pain Management: Neurontin 100 mg daily and 200 mg nightly, Flexeril and oxycodone as needed             -might benefit from scheduled oxycodone prior to therapies  4/7- Pt reports pain meds work- doesn't want things changed right now- will monitor  4/10- feels like pain manageable. 4/11- Pt's buttocks are sore/painful and back still hurting- con't regimen- but might need things increased over weekend  4. Mood/Behavior/Sleep: Celexa 40 mg daily, Klonopin as needed             -antipsychotic agents: N/A -pt with anxiety about therapy tomorrow. Reassured her that team will work with her at her pace and be considerate of her pain. I also told her family could stay over with her if it helped her anxiety.  -02/03/24 per pharmacy yesterday, pt on trazodone 150mg  nightly-- but reported sleeping well so hold off on starting that for now.  4/9- sleeping well  4/11- start Buspar 5-10 mg TID prn to help with claustrophobia and anxiety- if that doesn't work, has Klonopin-  5. Neuropsych/cognition: This patient is  capable of making decisions on her own behalf. 6. Skin/Wound Care: Routine skin checks 7. Fluids/Electrolytes/Nutrition: Routine in and outs, cont vitamins/supplements             -mild hyponatremia on recent labs- Na 133 on 02/02/24             -f/u labs Monday   4/7- labs pending- will monitor for them 8.  Acute blood loss anemia on chronic anemia:        -pt transfused 1u PRBC today for hgb of 7.3 -fe++ supp -02/03/24 Hgb 9.1 yesterday, f/up CBC tomorrow.   4/7- Labs pending- will monitor for labs  4/9- Hb stable at 9.3 9.  History aortic stenosis/MVP.  Status post aortic valve replacement July 2024 per Dr. Cliffton Asters 10.  Hypertension.  Toprol-XL 25 mg twice daily, Lasix 40 mg daily.  Monitor with increased mobility             -controlled at present  4/7- 4/9- BP controlled- con't regimen  4/10 - 4/12 overall BP controlled-  Vitals:   02/08/24 0233 02/08/24 0342 02/08/24 0442 02/08/24 0608  BP: 119/60 139/75 (!) 158/54 131/69   02/08/24 0845 02/08/24 1037 02/08/24 1047 02/08/24 1250  BP: 121/74 127/62 (!) 150/75 125/70   02/08/24 1809 02/08/24 2011 02/09/24 0340 02/09/24 0427  BP: (!) 126/112 124/60 119/64 114/84      11.  Hyperlipidemia.  Lipitor 80mg  daily 12.  Diabetes mellitus with peripheral neuropathy.  Latest hemoglobin A1c 9.7.                -sugars poorly controlled at present as well              -pt is on metformin XR 500mg  2 tablets daily as she takes at home             -SSI             -might need adjustments to her standing regimen  - 4/6 CBGs ok control but a little high in the mornings; will add HS SSI but ultimately might need adjustment to her metformin XR if her sugars are persistently elevated. Per pharmacy note yesterday, she was on insulin glargine 30U HS  and mounjaro 7.5mg  weekly-- verify this is what she was taking, and potentially adding the glargine back will help  4/7- will add Lantus 10 units nightly (usually takes at 6pm)- took 45 units daily at home,  but I don't think needs that yet based on CBG's.  4/8- Pt's Cbgs 159 this AM- still in 200's, but will increase Insulin tomorrow if stills needs it 4/9- will increase Lantus to 13 units nightly 4/10- just increased Lantus yesterday-max CBG 385 yesterday - eating snacks.  4/11- Will increase Lantus to 18 units- since mainly in 200's300s lately- pt was on 45 units at home. Might need titration this weekend -02/09/24 CBGs still very elevated, will increase lantus to 22U  CBG (last 3)  Recent Labs    02/08/24 1644 02/08/24 2149 02/09/24 0806  GLUCAP 282* 294* 290*    14.  Restless leg syndrome.  Mirapex 1 mg nightly 15.  Obesity.  BMI 30.99.  Dietary follow-up 16. Constipation: colace BID, miralax daily  - LBM 4/3, start MiraLAX daily  4/9- LBM yesterday  4/11- LBM 2 days ago  -02/09/24 LBM last night, monitor 17. Small abrasion ok chin  -bacitracin, she reports she is allergic to tape 18. Hypokalemia  4/8- K+ 3.4- will report KCL 40 mEq x1- and recheck Thursday  4/10- K+ 3.8  19. Incision itching  4/10- ordered Eucerin to go around incision, not on incision- and took off dressing to help itching, since skin moist- not draining.  20. Claustrophobia  4/11- Will add Buspar 5-10 mg TID prn    LOS: 8 days A FACE TO FACE EVALUATION WAS PERFORMED  [address] 02/09/2024, 11:03 AM

## 2024-02-09 NOTE — Progress Notes (Signed)
 Occupational Therapy Session Note  Patient Details  Name: Stacy Frost MRN: [id number] Date of Birth: December 15, 1960  Today's Date: 02/09/2024 OT Individual Time: 1005-1045 OT Individual Time Calculation (min): 40 min    Short Term Goals: Week 1:  OT Short Term Goal 1 (Week 1): STGs=LTGs due to patient's estimated length of stay.  Skilled Therapeutic Interventions/Progress Updates:  Skilled OT intervention completed with focus on ADL retraining, functional endurance, and mobility within a shower context. Pt received supine in bed, agreeable to session. No pain reported. Pt requesting to shower  Pt completed all sit > stands, stand pivots and ambulatory transfers with CGA/supervision while using rollator with good awareness of locking brakes however min cues needed for positioning of rollator and for gait speed as pt did have 1 instance of L knee buckling when distracted and walking over bathroom threshold.  Waterproof cover applied to lumbar incision prior to shower. Pt was able to bathe all parts with intermittent supervision, at the seated level only, using figure 4 position and LH sponge to was BLE to stay within back precautions. Dressed on EOB, with set up A for UB, excluding LSO, and supervision/CGA for underwear/shorts with cues to donn LLE first and intermittent use of reacher. Min A needed for donning L sock as pt unable to do one handed technique and limited by back precautions.  Seated on rollator at sink, pt was set up A for hair drying and styling. Transferred back to EOB. Pt remained seated EOB, with bed alarm on/activated, and with all needs in reach at end of session.   Therapy Documentation Precautions:  Precautions Precautions: Back, Fall Recall of Precautions/Restrictions: Impaired (Unable to recall "lift") Precaution/Restrictions Comments: Verbally reviewed precautions, re-education on acronym BLT. Required Braces or Orthoses: Spinal Brace Spinal Brace: Lumbar corset,  Applied in sitting position Restrictions Weight Bearing Restrictions Per Provider Order: No    Therapy/Group: Individual Therapy  Ruthanna Covert, MS, OTR/L  02/09/2024, 11:42 AM

## 2024-02-10 LAB — GLUCOSE, CAPILLARY
Glucose-Capillary: 185 mg/dL — ABNORMAL HIGH (ref 70–99)
Glucose-Capillary: 339 mg/dL — ABNORMAL HIGH (ref 70–99)
Glucose-Capillary: 314 mg/dL — ABNORMAL HIGH (ref 70–99)
Glucose-Capillary: 328 mg/dL — ABNORMAL HIGH (ref 70–99)

## 2024-02-10 NOTE — Progress Notes (Signed)
 PROGRESS NOTE   Subjective/Complaints:  Pt doing well again today, slept well, pain well managed, LBM last night per pt (not documented), urinating fine. Denies any other complaints or concerns today.    ROS:    Pt denies SOB, abd pain, CP, N/V/C/D, and vision changes    Objective:   No results found.    No results for input(s): "WBC", "HGB", "HCT", "PLT" in the last 72 hours.  No results for input(s): "NA", "K", "CL", "CO2", "GLUCOSE", "BUN", "CREATININE", "CALCIUM" in the last 72 hours.   Intake/Output Summary (Last 24 hours) at 02/10/2024 0832 Last data filed at 02/10/2024 0713 Gross per 24 hour  Intake 355 ml  Output --  Net 355 ml        Physical Exam: Vital Signs Blood pressure 125/71, pulse 92, temperature 98.8 F (37.1 C), temperature source Oral, resp. rate 18, height 5\' 6"  (1.676 m), weight 84.7 kg, SpO2 100%.   General: awake, alert, appropriate, resting in bed comfortably with husband at bedside; NAD HENT: conjugate gaze; oropharynx moist CV: regular rate and rhythm; no JVD, valvular murmur, no rub/gallops Pulmonary: CTA B/L; no W/R/R- good air movement GI: soft, NT, ND, (+)BS Psychiatric: appropriate and less anxious Neurological: Ox3 Extremities: No clubbing, cyanosis, or edema. Pulses are 2+ Skin- no drainage but very bottom of incision is a little wider than rest, no surrounding erythema or warmth.    PRIOR EXAMS: Skin: Back incision Clean and intact , small abrasion on L chin Neuro:  Alert and oriented x 3. Normal insight and awareness. Intact Memory. Normal language and speech. Cranial nerve exam unremarkable. MMT: RUE 5/5. LUE 4+/5 prox to distal. RLE HF 2+/5 with pain inhibition, KE 3+, ADF/PF 4/5. LLE 2- to 2+/5 HF KE with pain component to weakness and 1+ to 2/5 ADF/PF. Decreased LT and PP bilateral LE's below knee. Intact saddle sensation. DTR's tr-1+. No abnl resting tone. No  cerebellar signs. .  Musculoskeletal: Low back tender to palpation and attempts at trunk movement. Pt wearing LSO    Assessment/Plan: 1. Functional deficits which require 3+ hours per day of interdisciplinary therapy in a comprehensive inpatient rehab setting. Physiatrist is providing close team supervision and 24 hour management of active medical problems listed below. Physiatrist and rehab team continue to assess barriers to discharge/monitor patient progress toward functional and medical goals  Care Tool:  Bathing    Body parts bathed by patient: Right arm, Left arm, Chest, Abdomen, Right upper leg, Face, Left upper leg   Body parts bathed by helper: Right lower leg, Left lower leg, Front perineal area, Buttocks     Bathing assist Assist Level: Moderate Assistance - Patient 50 - 74%     Upper Body Dressing/Undressing Upper body dressing   What is the patient wearing?: Pull over shirt    Upper body assist Assist Level: Supervision/Verbal cueing    Lower Body Dressing/Undressing Lower body dressing      What is the patient wearing?: Pants, Underwear/pull up     Lower body assist Assist for lower body dressing: Moderate Assistance - Patient 50 - 74%     Toileting Toileting    Toileting assist Assist for  toileting: Moderate Assistance - Patient 50 - 74%     Transfers Chair/bed transfer  Transfers assist     Chair/bed transfer assist level: Contact Guard/Touching assist     Locomotion Ambulation   Ambulation assist      Assist level: Contact Guard/Touching assist Assistive device: Rollator Max distance: 150'   Walk 10 feet activity   Assist     Assist level: Contact Guard/Touching assist Assistive device: Rollator   Walk 50 feet activity   Assist Walk 50 feet with 2 turns activity did not occur: Safety/medical concerns  Assist level: Contact Guard/Touching assist Assistive device: Rollator    Walk 150 feet activity   Assist Walk 150  feet activity did not occur: Safety/medical concerns  Assist level: Contact Guard/Touching assist Assistive device: Rollator    Walk 10 feet on uneven surface  activity   Assist Walk 10 feet on uneven surfaces activity did not occur: Safety/medical concerns         Wheelchair     Assist Is the patient using a wheelchair?: Yes Type of Wheelchair: Manual    Wheelchair assist level: Supervision/Verbal cueing Max wheelchair distance: 100'    Wheelchair 50 feet with 2 turns activity    Assist        Assist Level: Supervision/Verbal cueing   Wheelchair 150 feet activity     Assist      Assist Level: Moderate Assistance - Patient 50 - 74%   Blood pressure 125/71, pulse 92, temperature 98.8 F (37.1 C), temperature source Oral, resp. rate 18, height 5\' 6"  (1.676 m), weight 84.7 kg, SpO2 100%.  Medical Problem List and Plan: 1. 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   -Back corset when out of bed             -patient may shower             -ELOS/Goals: 9-14 days  D/c 4/16  Con't CIR PT and OT Pt fell last night- head CT (-)- just sore this AM- will monitor- d/w nursing- don't think she needs telesitter- just got feet tangled. But will need posey belt I think per policy.  -of note, will not need f/up with me, only surgeon 2.  Antithrombotics: -DVT/anticoagulation:  Mechanical: Antiembolism stockings, thigh (TED hose) Bilateral lower extremities  -02/03/24 DVT US negative 4/8- will start Lovenox 40 mg daily since it's been 7 days since surgery- pt only walking ~ 146ft total/day             -antiplatelet therapy: N/A 3. Pain Management: Neurontin 100 mg daily and 200 mg nightly, Flexeril and oxycodone as needed             -might benefit from scheduled oxycodone prior to therapies  4/7- Pt reports pain meds work- doesn't want things changed right now-  will monitor  4/10- feels like pain manageable. 4/11- Pt's buttocks are sore/painful and back still hurting- con't regimen- but might need things increased over weekend  4. Mood/Behavior/Sleep: Celexa 40 mg daily, Klonopin as needed             -antipsychotic agents: N/A -pt with anxiety about therapy tomorrow. Reassured her that team will work with her at her pace and be considerate of her pain. I also told her family could stay over with her if it helped her anxiety.  -02/03/24 per pharmacy yesterday, pt on trazodone 150mg  nightly-- but reported sleeping well so hold off  on starting that for now.  4/9- sleeping well  4/11- start Buspar 5-10 mg TID prn to help with claustrophobia and anxiety- if that doesn't work, has Klonopin-  5. Neuropsych/cognition: This patient is capable of making decisions on her own behalf. 6. Skin/Wound Care: Routine skin checks 7. Fluids/Electrolytes/Nutrition: Routine in and outs, cont vitamins/supplements             -mild hyponatremia on recent labs- Na 133 on 02/02/24             -f/up labs 4/10 stable with improved Na 137 8.  Acute blood loss anemia on chronic anemia:        -pt transfused 1u PRBC today for hgb of 7.3 -fe++ supp -02/03/24 Hgb 9.1 yesterday, f/up CBC tomorrow.   4/7- Labs pending- will monitor for labs  4/8- Hb stable at 9.3  -4/9 hgb down to 8.6 so monitor closely on Monday 9.  History aortic stenosis/MVP.  Status post aortic valve replacement July 2024 per Dr. Cliffton Asters 10.  Hypertension.  Toprol-XL 25 mg twice daily, Lasix 40 mg daily.  Monitor with increased mobility             -controlled at present  4/7- 4/9- BP controlled- con't regimen  4/10 - 4/13 overall BP controlled-  Vitals:   02/08/24 0845 02/08/24 1037 02/08/24 1047 02/08/24 1250  BP: 121/74 127/62 (!) 150/75 125/70   02/08/24 1809 02/08/24 2011 02/09/24 0340 02/09/24 0427  BP: (!) 126/112 124/60 119/64 114/84   02/09/24 1115 02/09/24 1405 02/09/24 1903 02/10/24 0348  BP:  101/79 (!) 119/49 (!) 105/57 125/71      11.  Hyperlipidemia.  Lipitor 80mg  daily 12.  Diabetes mellitus with peripheral neuropathy.  Latest hemoglobin A1c 9.7.                -sugars poorly controlled at present as well              -pt is on metformin XR 500mg  2 tablets daily as she takes at home             -SSI             -might need adjustments to her standing regimen  - 4/6 CBGs ok control but a little high in the mornings; will add HS SSI but ultimately might need adjustment to her metformin XR if her sugars are persistently elevated. Per pharmacy note yesterday, she was on insulin glargine 30U HS and mounjaro 7.5mg  weekly-- verify this is what she was taking, and potentially adding the glargine back will help -4/7- will add Lantus 10 units nightly (usually takes at 6pm)- took 45 units daily at home, but I don't think needs that yet based on CBG's.  4/8- Pt's Cbgs 159 this AM- still in 200's, but will increase Insulin tomorrow if stills needs it 4/9- will increase Lantus to 13 units nightly 4/10- just increased Lantus yesterday-max CBG 385 yesterday - eating snacks.  4/11- Will increase Lantus to 18 units- since mainly in 200's300s lately- pt was on 45 units at home. Might need titration this weekend -02/09/24 CBGs still very elevated, will increase lantus to 22U  -02/10/24 CBGs a bit more variable but starting to come down; monitor with changes yesterday CBG (last 3)  Recent Labs    02/09/24 1614 02/09/24 2110 02/10/24 0639  GLUCAP 173* 350* 185*    14.  Restless leg syndrome.  Mirapex 1 mg nightly 15.  Obesity.  BMI 30.99.  Dietary  follow-up 16. Constipation: colace BID, miralax daily  - LBM 4/3, start MiraLAX daily  4/9- LBM yesterday  4/11- LBM 2 days ago  -02/10/24 LBM last night per pt, monitor 17. Small abrasion ok chin  -bacitracin, she reports she is allergic to tape 18. Hypokalemia  4/8- K+ 3.4- will report KCL 40 mEq x1- and recheck Thursday  4/10- K+ 3.8  19.  Incision itching 4/10- ordered Eucerin to go around incision, not on incision- and took off dressing to help itching, since skin moist- not draining.  20. Claustrophobia  4/11- Will add Buspar 5-10 mg TID prn    LOS: 9 days A FACE TO FACE EVALUATION WAS PERFORMED  [address] 02/10/2024, 8:32 AM

## 2024-02-10 NOTE — Progress Notes (Signed)
 CBG is 339 given 11 units of insulin. Denies any changes to vision, N&V, or lightheadedness at this time. Denies any sensation of fatigue. Does report an increase in thirst. Does endorse increase episodes of urination, but per flowsheet shows voided four times last 24 hrs. Vitals obtained.

## 2024-02-10 NOTE — Progress Notes (Signed)
 Occupational Therapy Session Note  Patient Details  Name: Stacy Frost MRN: [id number] Date of Birth: 01/01/1961  Today's Date: 02/10/2024 OT Individual Time: 1445-1540 OT Individual Time Calculation (min): 55 min    Short Term Goals: Week 1:  OT Short Term Goal 1 (Week 1): STGs=LTGs due to patient's estimated length of stay.  Skilled Therapeutic Interventions/Progress Updates:      Therapy Documentation Precautions:  Precautions Precautions: Back, Fall Recall of Precautions/Restrictions: Impaired (Unable to recall "lift") Precaution/Restrictions Comments: Verbally reviewed precautions, re-education on acronym BLT. Required Braces or Orthoses: Spinal Brace Spinal Brace: Lumbar corset, Applied in sitting position Restrictions Weight Bearing Restrictions Per Provider Order: No General: "I will be careful" Pt supine in bed upon OT arrival, agreeable to OT session.  Pain:  unrated soreness reported in LLE, activity, intermittent rest breaks, distractions provided for pain management, pt reports tolerable to proceed.   ADL: OT providing skilled intervention with functional mobility. Pt able to complete mobility from room><therapy gym with CGA and VC for increased stride length with LLE, no buckling noted and no LOB/SOB. OT providing intervention with toileting at end of session, pt voiding urine and able to complete 3/3 steps of toileting with SBA.  Exercises: Pt completed 10 minutes of nu step bike in order to increase BUE/BLEstrength and endurance in preparation for increased independence in ADLs such as bathing. No rest break necessary, on level 6 resistance.   Other Treatments: OT educating pt on fall prevention in leu of fall on Friday PM. Pt reporting feet tangling up in rollator after feeling LLE weakness. OT educating locking rollator and sitting if feeling fatigue or weakness for increased safety.     Pt seated in bed with bed alarm activated, 2 bed rails up, call light within  reach and 4Ps assessed.   Therapy/Group: Individual Therapy  Nila Barth, OTD, OTR/L 02/10/2024, 4:07 PM

## 2024-02-11 ENCOUNTER — Other Ambulatory Visit (HOSPITAL_COMMUNITY): Payer: Self-pay

## 2024-02-11 DIAGNOSIS — M545 Low back pain, unspecified: Secondary | ICD-10-CM

## 2024-02-11 DIAGNOSIS — E876 Hypokalemia: Secondary | ICD-10-CM

## 2024-02-11 DIAGNOSIS — E1142 Type 2 diabetes mellitus with diabetic polyneuropathy: Secondary | ICD-10-CM

## 2024-02-11 LAB — CBC
HCT: 26.7 % — ABNORMAL LOW (ref 36.0–46.0)
Hemoglobin: 8.8 g/dL — ABNORMAL LOW (ref 12.0–15.0)
MCH: 28.2 pg (ref 26.0–34.0)
MCHC: 33 g/dL (ref 30.0–36.0)
MCV: 85.6 fL (ref 80.0–100.0)
Platelets: 437 10*3/uL — ABNORMAL HIGH (ref 150–400)
RBC: 3.12 MIL/uL — ABNORMAL LOW (ref 3.87–5.11)
RDW: 13.5 % (ref 11.5–15.5)
WBC: 7.5 10*3/uL (ref 4.0–10.5)
nRBC: 0 % (ref 0.0–0.2)

## 2024-02-11 LAB — GLUCOSE, CAPILLARY
Glucose-Capillary: 186 mg/dL — ABNORMAL HIGH (ref 70–99)
Glucose-Capillary: 232 mg/dL — ABNORMAL HIGH (ref 70–99)
Glucose-Capillary: 142 mg/dL — ABNORMAL HIGH (ref 70–99)
Glucose-Capillary: 330 mg/dL — ABNORMAL HIGH (ref 70–99)
Glucose-Capillary: 349 mg/dL — ABNORMAL HIGH (ref 70–99)

## 2024-02-11 LAB — BASIC METABOLIC PANEL WITH GFR
Anion gap: 8 (ref 5–15)
BUN: 11 mg/dL (ref 8–23)
CO2: 30 mmol/L (ref 22–32)
Calcium: 8.8 mg/dL — ABNORMAL LOW (ref 8.9–10.3)
Chloride: 100 mmol/L (ref 98–111)
Creatinine, Ser: 0.78 mg/dL (ref 0.44–1.00)
GFR, Estimated: >60 mL/min (ref >60)
Glucose, Bld: 210 mg/dL — ABNORMAL HIGH (ref 70–99)
Potassium: 3.8 mmol/L (ref 3.5–5.1)
Sodium: 138 mmol/L (ref 135–145)

## 2024-02-11 MED ORDER — FUROSEMIDE 40 MG PO TABS
40.0000 mg | ORAL_TABLET | Freq: Every day | ORAL | 0 refills | Status: DC
  Filled 2024-02-11: qty 30, 30d supply, fill #0

## 2024-02-11 MED ORDER — DOCUSATE SODIUM 100 MG PO CAPS
100.0000 mg | ORAL_CAPSULE | Freq: Two times a day (BID) | ORAL | Status: AC
Start: 2024-02-11 — End: ?

## 2024-02-11 MED ORDER — METOPROLOL SUCCINATE ER 25 MG PO TB24
25.0000 mg | ORAL_TABLET | Freq: Two times a day (BID) | ORAL | 0 refills | Status: DC
  Filled 2024-02-11: qty 60, 30d supply, fill #0

## 2024-02-11 MED ORDER — CLONAZEPAM 0.5 MG PO TABS
0.5000 mg | ORAL_TABLET | Freq: Two times a day (BID) | ORAL | 0 refills | Status: AC | PRN
  Filled 2024-02-11: qty 10, 5d supply, fill #0

## 2024-02-11 MED ORDER — PRAMIPEXOLE DIHYDROCHLORIDE 1 MG PO TABS
1.0000 mg | ORAL_TABLET | Freq: Every day | ORAL | Status: DC
  Administered 2024-02-11 – 2024-02-12 (×2): 1 mg via ORAL
  Filled 2024-02-11 (×2): qty 1

## 2024-02-11 MED ORDER — POLYETHYLENE GLYCOL 3350 17 G PO PACK: 17.0000 g | PACK | Freq: Every day | ORAL | Status: AC

## 2024-02-11 MED ORDER — INSULIN GLARGINE-YFGN 100 UNIT/ML ~~LOC~~ SOLN
24.0000 [IU] | Freq: Every day | SUBCUTANEOUS | Status: DC
  Administered 2024-02-11 – 2024-02-12 (×2): 24 [IU] via SUBCUTANEOUS
  Filled 2024-02-11 (×3): qty 0.24

## 2024-02-11 MED ORDER — OXYCODONE HCL 5 MG PO TABS
5.0000 mg | ORAL_TABLET | ORAL | 0 refills | Status: AC | PRN
  Filled 2024-02-11: qty 30, 5d supply, fill #0

## 2024-02-11 MED ORDER — TRAZODONE HCL 150 MG PO TABS
150.0000 mg | ORAL_TABLET | Freq: Every day | ORAL | 0 refills | Status: AC
  Filled 2024-02-11: qty 30, 30d supply, fill #0

## 2024-02-11 MED ORDER — POTASSIUM CHLORIDE CRYS ER 20 MEQ PO TBCR
20.0000 meq | EXTENDED_RELEASE_TABLET | Freq: Every day | ORAL | 0 refills | Status: DC
  Filled 2024-02-11: qty 30, 30d supply, fill #0

## 2024-02-11 MED ORDER — GABAPENTIN 100 MG PO CAPS
ORAL_CAPSULE | ORAL | 0 refills | Status: AC
  Filled 2024-02-11: qty 90, fill #0

## 2024-02-11 MED ORDER — PRAMIPEXOLE DIHYDROCHLORIDE 0.5 MG PO TABS
1.0000 mg | ORAL_TABLET | Freq: Every day | ORAL | 0 refills | Status: DC
  Filled 2024-02-11: qty 30, 15d supply, fill #0

## 2024-02-11 MED ORDER — ATORVASTATIN CALCIUM 80 MG PO TABS
80.0000 mg | ORAL_TABLET | Freq: Every day | ORAL | 0 refills | Status: DC
  Filled 2024-02-11: qty 30, 30d supply, fill #0

## 2024-02-11 MED ORDER — INSULIN GLARGINE-YFGN 100 UNIT/ML ~~LOC~~ SOPN
22.0000 [IU] | PEN_INJECTOR | Freq: Every day | SUBCUTANEOUS | 0 refills | Status: DC
  Filled 2024-02-11: qty 9, 40d supply, fill #0

## 2024-02-11 MED ORDER — METFORMIN HCL ER 500 MG PO TB24
1000.0000 mg | ORAL_TABLET | Freq: Every day | ORAL | 0 refills | Status: DC
  Filled 2024-02-11: qty 60, 30d supply, fill #0

## 2024-02-11 MED ORDER — CITALOPRAM HYDROBROMIDE 40 MG PO TABS
40.0000 mg | ORAL_TABLET | Freq: Every day | ORAL | 0 refills | Status: AC
Start: 2024-02-11 — End: ?
  Filled 2024-02-11: qty 29, 29d supply, fill #0

## 2024-02-11 MED ORDER — CYCLOBENZAPRINE HCL 10 MG PO TABS
10.0000 mg | ORAL_TABLET | Freq: Three times a day (TID) | ORAL | 0 refills | Status: AC | PRN
  Filled 2024-02-11: qty 60, 20d supply, fill #0

## 2024-02-11 MED ORDER — FERROUS SULFATE 325 (65 FE) MG PO TABS
325.0000 mg | ORAL_TABLET | Freq: Every day | ORAL | 0 refills | Status: AC
  Filled 2024-02-11: qty 30, 30d supply, fill #0

## 2024-02-11 MED ORDER — GABAPENTIN 100 MG PO CAPS
100.0000 mg | ORAL_CAPSULE | Freq: Three times a day (TID) | ORAL | 0 refills | Status: DC
  Filled 2024-02-11: qty 90, 30d supply, fill #0

## 2024-02-11 MED ORDER — ACETAMINOPHEN 325 MG PO TABS: 650.0000 mg | ORAL_TABLET | ORAL | Status: AC | PRN

## 2024-02-11 NOTE — Progress Notes (Addendum)
 PROGRESS NOTE   Subjective/Complaints: Patient reports some difficulty sleeping last night. She would like sleep medications little earlier.  Reports buttocks/back pain overall controlled with current regimen. LBM 4/13   ROS:    Pt denies SOB, abd pain, CP, N/V/C/D, and vision changes + Insomnia   Objective:   No results found.    Recent Labs    02/11/24 0520  WBC 7.5  HGB 8.8*  HCT 26.7*  PLT 437*    Recent Labs    02/11/24 0520  NA 138  K 3.8  CL 100  CO2 30  GLUCOSE 210*  BUN 11  CREATININE 0.78  CALCIUM 8.8*     Intake/Output Summary (Last 24 hours) at 02/11/2024 1606 Last data filed at 02/11/2024 1300 Gross per 24 hour  Intake 480 ml  Output --  Net 480 ml        Physical Exam: Vital Signs Blood pressure 137/72, pulse 83, temperature 98.2 F (36.8 C), temperature source Oral, resp. rate 16, height 5\' 6"  (1.676 m), weight 84.7 kg, SpO2 99%.   General: awake, alert, appropriate, resting in bed comfortably  HENT: conjugate gaze; oropharynx moist CV: regular rate and rhythm; no JVD, valvular murmur, no rub/gallops Pulmonary: Clear to auscultation bilaterally, on room air, nonlabored breathing GI: soft, NT, ND, (+)BS Psychiatric: appropriate and cooperative Neurological: Ox3 Extremities: No clubbing, cyanosis, or edema. Pulses are 2+ Skin- no drainage but very bottom of incision is a little wider than rest, no surrounding erythema or warmth.    PRIOR EXAMS: Skin: Back incision Clean and intact , small abrasion on L chin Neuro:  Alert and oriented x 3. Normal insight and awareness. Intact Memory. Normal language and speech. Cranial nerve exam unremarkable. MMT: RUE 5/5. LUE 4+/5 prox to distal. RLE HF 2+/5 with pain inhibition, KE 3+, ADF/PF 4/5. LLE 2- to 2+/5 HF KE with pain component to weakness and 1+ to 2/5 ADF/PF. Decreased LT and PP bilateral LE's below knee. Intact saddle sensation.  DTR's tr-1+. No abnl resting tone. No cerebellar signs. .  Musculoskeletal: Low back tender to palpation and attempts at trunk movement. Pt wearing LSO    Assessment/Plan: 1. Functional deficits which require 3+ hours per day of interdisciplinary therapy in a comprehensive inpatient rehab setting. Physiatrist is providing close team supervision and 24 hour management of active medical problems listed below. Physiatrist and rehab team continue to assess barriers to discharge/monitor patient progress toward functional and medical goals  Care Tool:  Bathing    Body parts bathed by patient: Right arm, Left arm, Chest, Abdomen, Right upper leg, Face, Left upper leg, Front perineal area, Buttocks, Right lower leg, Left lower leg   Body parts bathed by helper: Right lower leg, Left lower leg, Front perineal area, Buttocks     Bathing assist Assist Level: Supervision/Verbal cueing     Upper Body Dressing/Undressing Upper body dressing   What is the patient wearing?: Bra, Pull over shirt, Orthosis    Upper body assist Assist Level: Supervision/Verbal cueing    Lower Body Dressing/Undressing Lower body dressing      What is the patient wearing?: Pants, Underwear/pull up     Lower body  assist Assist for lower body dressing: Supervision/Verbal cueing     Toileting Toileting    Toileting assist Assist for toileting: Moderate Assistance - Patient 50 - 74%     Transfers Chair/bed transfer  Transfers assist     Chair/bed transfer assist level: Independent with assistive device Chair/bed transfer assistive device:  (rollator and LSO)   Locomotion Ambulation   Ambulation assist      Assist level: Supervision/Verbal cueing Assistive device: Rollator Max distance: 150'   Walk 10 feet activity   Assist     Assist level: Supervision/Verbal cueing Assistive device: Orthosis, Rollator   Walk 50 feet activity   Assist Walk 50 feet with 2 turns activity did not  occur: Safety/medical concerns  Assist level: Supervision/Verbal cueing Assistive device: Rollator, Orthosis    Walk 150 feet activity   Assist Walk 150 feet activity did not occur: Safety/medical concerns  Assist level: Supervision/Verbal cueing Assistive device: Rollator, Orthosis    Walk 10 feet on uneven surface  activity   Assist Walk 10 feet on uneven surfaces activity did not occur: Safety/medical concerns   Assist level: Supervision/Verbal cueing Assistive device: Rollator, Orthosis   Wheelchair     Assist Is the patient using a wheelchair?: No Type of Wheelchair: Manual    Wheelchair assist level: Supervision/Verbal cueing Max wheelchair distance: 100'    Wheelchair 50 feet with 2 turns activity    Assist        Assist Level: Supervision/Verbal cueing   Wheelchair 150 feet activity     Assist      Assist Level: Moderate Assistance - Patient 50 - 74%   Blood pressure 137/72, pulse 83, temperature 98.2 F (36.8 C), temperature source Oral, resp. rate 16, height 5\' 6"  (1.676 m), weight 84.7 kg, SpO2 99%.  Medical Problem List and Plan: 1. 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   -Back corset when out of bed             -patient may shower             -ELOS/Goals: 9-14 days  D/c 4/16  Con't CIR PT and OT Pt fell last night- head CT (-)- just sore this AM- will monitor- d/w nursing- don't think she needs telesitter- just got feet tangled. But will need posey belt I think per policy.  -of note, will not need f/up with me, only surgeon 2.  Antithrombotics: -DVT/anticoagulation:  Mechanical: Antiembolism stockings, thigh (TED hose) Bilateral lower extremities  -02/03/24 DVT US negative 4/8- will start Lovenox 40 mg daily since it's been 7 days since surgery- pt only walking ~ 169ft total/day             -antiplatelet therapy: N/A 3.  Pain Management: Neurontin 100 mg daily and 200 mg nightly, Flexeril and oxycodone as needed             -might benefit from scheduled oxycodone prior to therapies  4/7- Pt reports pain meds work- doesn't want things changed right now- will monitor  4/10- feels like pain manageable. 4/11- Pt's buttocks are sore/painful and back still hurting- con't regimen- but might need things increased over weekend  4/14 back/buttocks pain controlled, continue current regimen for now 4. Mood/Behavior/Sleep: Celexa 40 mg daily, Klonopin as needed             -antipsychotic agents: N/A -pt with anxiety about therapy tomorrow. Reassured her that team will work with her at  her pace and be considerate of her pain. I also told her family could stay over with her if it helped her anxiety.  -02/03/24 per pharmacy yesterday, pt on trazodone 150mg  nightly-- but reported sleeping well so hold off on starting that for now.  4/9- sleeping well  4/11- start Buspar 5-10 mg TID prn to help with claustrophobia and anxiety- if that doesn't work, has Klonopin-  4/14 pramiprexole timing adjusted to 9pm, she has zolpidem PRN for insomnia- doesn't appear she used last night 5. Neuropsych/cognition: This patient is capable of making decisions on her own behalf. 6. Skin/Wound Care: Routine skin checks 7. Fluids/Electrolytes/Nutrition: Routine in and outs, cont vitamins/supplements             -mild hyponatremia on recent labs- Na 133 on 02/02/24             -f/up labs 4/10 stable with improved Na 137 8.  Acute blood loss anemia on chronic anemia:        -pt transfused 1u PRBC today for hgb of 7.3 -fe++ supp -02/03/24 Hgb 9.1 yesterday, f/up CBC tomorrow.   4/7- Labs pending- will monitor for labs  4/8- Hb stable at 9.3  -4/9 hgb down to 8.6 so monitor closely on Monday  -4/14 HGB stable at 8.8, continue to monitor 9.  History aortic stenosis/MVP.  Status post aortic valve replacement July 2024 per Dr. Cliffton Asters 10.  Hypertension.   Toprol-XL 25 mg twice daily, Lasix 40 mg daily.  Monitor with increased mobility             -controlled at present  4/7- 4/9- BP controlled- con't regimen  4/10 - 4/14 overall BP controlled-  Vitals:   02/09/24 0340 02/09/24 0427 02/09/24 1115 02/09/24 1405  BP: 119/64 114/84 101/79 (!) 119/49   02/09/24 1903 02/10/24 0348 02/10/24 0935 02/10/24 1150  BP: (!) 105/57 125/71 (!) 111/54 121/70   02/10/24 1312 02/10/24 1811 02/11/24 0356 02/11/24 1244  BP: (!) 111/56 127/70 (!) 140/63 137/72      11.  Hyperlipidemia.  Lipitor 80mg  daily 12.  Diabetes mellitus with peripheral neuropathy.  Latest hemoglobin A1c 9.7.                -sugars poorly controlled at present as well              -pt is on metformin XR 500mg  2 tablets daily as she takes at home             -SSI             -might need adjustments to her standing regimen  - 4/6 CBGs ok control but a little high in the mornings; will add HS SSI but ultimately might need adjustment to her metformin XR if her sugars are persistently elevated. Per pharmacy note yesterday, she was on insulin glargine 30U HS and mounjaro 7.5mg  weekly-- verify this is what she was taking, and potentially adding the glargine back will help -4/7- will add Lantus 10 units nightly (usually takes at 6pm)- took 45 units daily at home, but I don't think needs that yet based on CBG's.  4/8- Pt's Cbgs 159 this AM- still in 200's, but will increase Insulin tomorrow if stills needs it 4/9- will increase Lantus to 13 units nightly 4/10- just increased Lantus yesterday-max CBG 385 yesterday - eating snacks.  4/11- Will increase Lantus to 18 units- since mainly in 200's300s lately- pt was on 45 units at home. Might need titration this  weekend -02/09/24 CBGs still very elevated, will increase lantus to 22U  -02/10/24 CBGs a bit more variable but starting to come down; monitor with changes yesterday -4/14 Increase to 24u CBG (last 3)  Recent Labs    02/10/24 2128  02/11/24 0640 02/11/24 1158  GLUCAP 328* 186* 232*    14.  Restless leg syndrome.  Mirapex 1 mg nightly 15.  Obesity.  BMI 30.99.  Dietary follow-up 16. Constipation: colace BID, miralax daily  - LBM 4/3, start MiraLAX daily  4/9- LBM yesterday  4/11- LBM 2 days ago  -02/10/24 LBM last night per pt, monitor  -4/14 LBM yesterday, stable 17. Small abrasion ok chin  -bacitracin, she reports she is allergic to tape 18. Hypokalemia  4/8- K+ 3.4- will report KCL 40 mEq x1- and recheck Thursday  4/10- K+ 3.8  4/14 Stable at 3.8  19. Incision itching 4/10- ordered Eucerin to go around incision, not on incision- and took off dressing to help itching, since skin moist- not draining.  20. Claustrophobia  4/11- Will add Buspar 5-10 mg TID prn    LOS: 10 days A FACE TO FACE EVALUATION WAS PERFORMED  Stacy Frost 02/11/2024, 4:06 PM

## 2024-02-11 NOTE — Progress Notes (Signed)
 Occupational Therapy Session Note  Patient Details  Name: Stacy Frost MRN: [id number] Date of Birth: 08-Sep-1961  Today's Date: 02/11/2024 OT Individual Time: 1300-1330 OT Individual Time Calculation (min): 30 min    Short Term Goals: Week 1:  OT Short Term Goal 1 (Week 1): STGs=LTGs due to patient's estimated length of stay.  Skilled Therapeutic Interventions/Progress Updates:      Therapy Documentation Precautions:  Precautions Precautions: Back, Fall Recall of Precautions/Restrictions: Impaired (Unable to recall "lift") Precaution/Restrictions Comments: Verbally reviewed precautions, re-education on acronym BLT. Required Braces or Orthoses: Spinal Brace Spinal Brace: Lumbar corset, Applied in sitting position Restrictions Weight Bearing Restrictions Per Provider Order: No General: "I feel ready" Pt seated in W/C upon OT arrival, agreeable to OT.  Pain: no pain reported  Other Treatments: OT re-educating pt on wearing schedule for lumbar brace for bracing for increased safety at home. OT providing educating on completing HEP (will issue program tomorrow at DC) in order to increase gains made in IPR. OT educating on difference between OP and HH therapy services. Pt and OT discussed recommended equipment, pt reporting she owns all equiment necessary for D/C.    Pt seated in W/C at end of session with W/C alarm donned, call light within reach and 4Ps assessed.    Therapy/Group: Individual Therapy  Nila Barth, OTD, OTR/L 02/11/2024, 7:57 AM

## 2024-02-11 NOTE — Plan of Care (Signed)
  Problem: Consults Goal: RH SPINAL CORD INJURY PATIENT EDUCATION Description:  See Patient Education module for education specifics.  Outcome: Progressing   Problem: SCI BOWEL ELIMINATION Goal: RH STG MANAGE BOWEL WITH ASSISTANCE Description: STG Manage Bowel with mod I Assistance. Outcome: Progressing   Problem: SCI BLADDER ELIMINATION Goal: RH STG MANAGE BLADDER WITH ASSISTANCE Description: STG Manage Bladder With Mod I  Assistance Outcome: Progressing   Problem: RH SKIN INTEGRITY Goal: RH STG SKIN FREE OF INFECTION/BREAKDOWN Description: Manage  skin free of infection/breakdown with mod I assistance Outcome: Progressing   Problem: RH SAFETY Goal: RH STG ADHERE TO SAFETY PRECAUTIONS W/ASSISTANCE/DEVICE Description: STG Adhere to Safety Precautions With  mod I Assistance/Device. Outcome: Progressing

## 2024-02-11 NOTE — Progress Notes (Signed)
 Patient ID: Stacy Frost, female   DOB: 1961-10-25, 63 y.o.   MRN: [id number]  Met with pt to discuss follow up she is closest to [facility] will reach out to them to see what is needed for the referral.

## 2024-02-11 NOTE — Progress Notes (Signed)
 Occupational Therapy Session Note  Patient Details  Name: Stacy Frost MRN: [id number] Date of Birth: 10-29-1961  Today's Date: 02/11/2024 OT Individual Time: 1404-1500 OT Individual Time Calculation (min): 56 min    Short Term Goals: Week 1:  OT Short Term Goal 1 (Week 1): STGs=LTGs due to patient's estimated length of stay.  Skilled Therapeutic Interventions/Progress Updates:     Pt received sitting up in wc, dressed and ready for the day with LSO donned upon OT arrival. Pt presenting to be in good spirits receptive to skilled OT session reporting 2/10 pain in her back- OT offering intermittent rest breaks, repositioning, and therapeutic support to optimize participation in therapy session.   Pt requesting to use restroom at beginning of session. Engaged Pt in completing functional mobility <> bathroom to simulate walking household distances and to increase activity tolerance for BADLs with Pt able to complete with CGA using RW. Pt completed 3/3 toileting tasks using RW for balance with CGA following continent void- documented in flowsheets. Pt ambulated to sink using RW and maintained standing balance CGA while washing hands.   Engaged Pt in completing w/c propulsion to outdoor environment for UE endurance training to increase overall activity tolerance for BADLs with Pt able to complete >150 ft with supervision +increased time.  Spent time discussing d/c plans with focus on family support at d/c, DME recommendations, and home accessibility. Pt reporting that she will be receiving assistance with IADLs from her husband to ensure she is safe and able to adhere to spinal precautions at d/c. Provided education on bathroom safety and DME options of shower seat vs TTB. Pt reporting she owns a shower seat, however d/t her balance deficits she feels safer using a TTB at this time as it enable her to get in/out of shower without stepping over side of bath tub. (Informed primary OT). Also provided  education on energy conservation, fall prevention, and simple home modifications to increase Pt's safety at home with pt receptive to all education. Education provided on grad day and discharge day to support moral. Pt reporting that she feels more prepared for d/c following discussion.   Pt fatigued at end of session. Transported Pt back to room total A in wc for energy conservation and time management. Pt requesting to return to bed. Stand pivot wc > EOB using RW CGA. EOB > supine using log rolling technique supervision.   Pt was left resting in bed with call bell in reach, bed alarm on, and all needs met.    Therapy Documentation Precautions:  Precautions Precautions: Back, Fall Recall of Precautions/Restrictions: Impaired (Unable to recall "lift") Precaution/Restrictions Comments: Verbally reviewed precautions, re-education on acronym BLT. Required Braces or Orthoses: Spinal Brace Spinal Brace: Lumbar corset, Applied in sitting position Restrictions Weight Bearing Restrictions Per Provider Order: No  Therapy/Group: Individual Therapy  Geoffery Kiel 02/11/2024, 1:04 PM

## 2024-02-11 NOTE — Progress Notes (Signed)
 Occupational Therapy Discharge Summary  Patient Details  Name: Stacy Frost MRN: [id number] Date of Birth: Jan 31, 1961  Date of Discharge from OT service:February 12, 2024  {CHL IP REHAB OT TIME CALCULATIONS:304400400}   Patient has met {NUMBERS 0-12:18577} of {NUMBERS 0-12:18577} long term goals due to improved activity tolerance, improved balance, postural control, and ability to compensate for deficits.  Patient to discharge at overall Modified Independent level.  Patient's care partner is independent to provide the necessary physical assistance at discharge.    Reasons goals not met: All goals met  Recommendation:  No further OT services warranted.   Equipment: TTB  Reasons for discharge: treatment goals met and discharge from hospital  Patient/family agrees with progress made and goals achieved: Yes  OT Discharge Precautions/Restrictions    General   Vital Signs   Pain Pain Assessment Pain Scale: 0-10 Pain Score: 6  Pain Location: Back Pain Intervention(s): Repositioned ADL ADL Eating: Set up Where Assessed-Eating: Bed level Grooming: Setup Where Assessed-Grooming: Chair Upper Body Bathing: Setup Where Assessed-Upper Body Bathing: Chair Lower Body Bathing: Maximal assistance Where Assessed-Lower Body Bathing: Chair Upper Body Dressing: Setup Where Assessed-Upper Body Dressing: Chair Lower Body Dressing: Minimal assistance (With use of reacher) Where Assessed-Lower Body Dressing: Chair Toileting: Moderate assistance Where Assessed-Toileting: Toilet, Bedside Commode Toilet Transfer: Contact guard, Minimal assistance Toilet Transfer Method: Proofreader: Bedside commode, Grab bars Tub/Shower Transfer: Unable to assess Film/video editor Method: Unable to assess Vision   Perception    Praxis   Cognition   Sensation   Motor    Mobility     Trunk/Postural Assessment     Balance   Extremity/Trunk Assessment        Nila Barth, OTD, OTR/L 02/11/2024, 4:51 PM

## 2024-02-11 NOTE — Progress Notes (Signed)
 Occupational Therapy Session Note  Patient Details  Name: Stacy Frost MRN: [id number] Date of Birth: June 23, 1961  Today's Date: 02/11/2024 OT Individual Time: 4098-1191 OT Individual Time Calculation (min): 70 min    Short Term Goals: Week 1:  OT Short Term Goal 1 (Week 1): STGs=LTGs due to patient's estimated length of stay.  Skilled Therapeutic Interventions/Progress Updates:    Skilled OT intervention with focus on funcitonal amb with RW and rollator, bathing at shower level, dressing with sit<>stand from seat, standing balance, and BUE therex (cardiovascular endurance) to increase independence with bADLs. Pt amb with RW in room to access bathroom and completed bathing/dressing tasks with supervision, except donning shoe. Pt amb with Rollator to gym and completed standing activities at [facility] with focus on balance and safety awareness. Pt comleted 5 mins on UBE with avg RPM of 40. Pt returned to room and remained in w/c with all needs within reach. Pt's Lt knee did not buckle during the session. Reveiwed home safety recommendations.   Therapy Documentation Precautions:  Precautions Precautions: Back, Fall Recall of Precautions/Restrictions: Impaired (Unable to recall "lift") Precaution/Restrictions Comments: Verbally reviewed precautions, re-education on acronym BLT. Required Braces or Orthoses: Spinal Brace Spinal Brace: Lumbar corset, Applied in sitting position Restrictions Weight Bearing Restrictions Per Provider Order: No    Pain: Pt c/o 8/10 back pain; meds admin prior to therapy and shower Therapy/Group: Individual Therapy  Doak Free 02/11/2024, 8:50 AM

## 2024-02-11 NOTE — Progress Notes (Signed)
 Physical Therapy Discharge Summary  Patient Details  Name: Stacy Frost MRN: [id number] Date of Birth: February 24, 1961  Date of Discharge from PT service:February 12, 2024  {CHL IP REHAB PT TIME CALCULATION:304800500}   Patient has met {NUMBERS 0-12:18577} of {NUMBERS 0-12:18577} long term goals due to improved activity tolerance, improved balance, increased strength, decreased pain, ability to compensate for deficits, and functional use of  left lower extremity.  Patient to discharge at an ambulatory level Modified Independent/supervision with rollator.   Patient's care partner is independent to provide the necessary  supervision  assistance at discharge.  Reasons goals not met: ***  Recommendation:  Patient will benefit from ongoing skilled PT services in outpatient setting to continue to advance safe functional mobility, address ongoing impairments in strength, balance, endurance, gait, functional mobility, and minimize fall risk.  Equipment: No equipment provided. Pt already owns RW and rollator.  Reasons for discharge: treatment goals met and discharge from hospital  Patient/family agrees with progress made and goals achieved: Yes  PT Discharge Precautions/Restrictions    Pain Pain Assessment Pain Scale: 0-10 Pain Score: 8  Pain Location: Back Pain Intervention(s): Repositioned Pain Interference   Vision/Perception     Cognition   Sensation   Motor     Mobility   Locomotion     Trunk/Postural Assessment     Balance   Extremity Assessment            Gita Lamb Amadeo June, PT, DPT, CBIS  02/11/2024, 1:17 PM

## 2024-02-11 NOTE — Progress Notes (Signed)
 Physical Therapy Session Note  Patient Details  Name: Stacy Frost MRN: [id number] Date of Birth: May 26, 1961  Today's Date: 02/11/2024 PT Individual Time: 1914-7829 PT Individual Time Calculation (min): 43 min   Short Term Goals: Week 1:  PT Short Term Goal 1 (Week 1): STG=LTG due to ELOS PT Short Term Goal 1 - Progress (Week 1): Progressing toward goal Week 2:  PT Short Term Goal 1 (Week 2): = LTGs  Skilled Therapeutic Interventions/Progress Updates:    Pt up in w/c and eager for session and reporting eager for d/c home. Discussed overall goals, home setup, and DME recommendations. Pt reports she has rollator and RW at home (just needed to check with her husband about RW location) already and agreeable to [facility] for follow up services. Relayed information to CSW as well.  Functional transfers during session with rollator overall modified independent. Pt demonstrates functionally decreased strength in LLE but managed independently throughout session with no LOB.   Reviewed stair negotiation with curb step with rollator and demonstrated technique. Then pt completed x 2 trials with overall CGA and cues for placement of rollator. Reports she can manage this with husband as he was just providing hands on assist for her prior without device. Also reports there is a ramp option that she could use if needed. Practieced navigation of ramp up/down and over uneven mulched surface to simulate home access and community environment with overall supervision.   Simulated car transfer to SUV height with supervision and cues for technique initially.   Gait on unit in between therapy gyms for functional mobility training, strengthening, and endurance at supervision level overall - again managing any knee instability independently and no LOB.   Education provided on energy conservation, transition back into the community, and generalized progression of her strengthening/therapies upon d/c.  Pt appreciative of  education and receptive to all.  Therapy Documentation Precautions:  Precautions Precautions: Back, Fall Recall of Precautions/Restrictions: Impaired (Unable to recall "lift") Precaution/Restrictions Comments: Verbally reviewed precautions, re-education on acronym BLT. Required Braces or Orthoses: Spinal Brace Spinal Brace: Lumbar corset, Applied in sitting position Restrictions Weight Bearing Restrictions Per Provider Order: No    Pain:  8/10 pain - premedicated.   Therapy/Group: Individual Therapy  Gita Lamb Amadeo June, PT, DPT, CBIS  02/11/2024, 12:16 PM

## 2024-02-12 ENCOUNTER — Other Ambulatory Visit (HOSPITAL_COMMUNITY): Payer: Self-pay

## 2024-02-12 LAB — GLUCOSE, CAPILLARY
Glucose-Capillary: 160 mg/dL — ABNORMAL HIGH (ref 70–99)
Glucose-Capillary: 217 mg/dL — ABNORMAL HIGH (ref 70–99)
Glucose-Capillary: 144 mg/dL — ABNORMAL HIGH (ref 70–99)
Glucose-Capillary: 231 mg/dL — ABNORMAL HIGH (ref 70–99)

## 2024-02-12 MED ORDER — PRAMIPEXOLE DIHYDROCHLORIDE 1 MG PO TABS
1.0000 mg | ORAL_TABLET | Freq: Every day | ORAL | 0 refills | Status: AC
  Filled 2024-02-12: qty 30, 30d supply, fill #0

## 2024-02-12 MED ORDER — INSULIN ASPART 100 UNIT/ML IJ SOLN
3.0000 [IU] | Freq: Three times a day (TID) | INTRAMUSCULAR | Status: DC
  Administered 2024-02-12 – 2024-02-13 (×2): 3 [IU] via SUBCUTANEOUS

## 2024-02-12 MED ORDER — FUROSEMIDE 20 MG PO TABS
20.0000 mg | ORAL_TABLET | Freq: Every day | ORAL | Status: DC
Start: 2024-02-13 — End: 2024-02-13
  Administered 2024-02-13: 20 mg via ORAL
  Filled 2024-02-12: qty 1

## 2024-02-12 MED ORDER — INSULIN GLARGINE-YFGN 100 UNIT/ML ~~LOC~~ SOLN
24.0000 [IU] | Freq: Every day | SUBCUTANEOUS | 11 refills | Status: DC
Start: 2024-02-12 — End: 2024-04-17
  Filled 2024-02-12: qty 10, 41d supply, fill #0

## 2024-02-12 NOTE — Progress Notes (Addendum)
 PROGRESS NOTE   Subjective/Complaints: Pt slept better last night.  She was made independent in the room. LBM 4/13   ROS:    Pt denies SOB, abd pain, CP, N/V/C/D, and vision changes + Insomnia- improved    Objective:   No results found.    Recent Labs    02/11/24 0520  WBC 7.5  HGB 8.8*  HCT 26.7*  PLT 437*    Recent Labs    02/11/24 0520  NA 138  K 3.8  CL 100  CO2 30  GLUCOSE 210*  BUN 11  CREATININE 0.78  CALCIUM 8.8*     Intake/Output Summary (Last 24 hours) at 02/12/2024 1104 Last data filed at 02/12/2024 0700 Gross per 24 hour  Intake 720 ml  Output --  Net 720 ml        Physical Exam: Vital Signs Blood pressure (!) 119/52, pulse 99, temperature 98.8 F (37.1 C), temperature source Oral, resp. rate 17, height 5\' 6"  (1.676 m), weight 84.7 kg, SpO2 99%.   General: awake, alert, appropriate, in the bathroom just completed showering and got dressed with therapy  HENT: conjugate gaze; oropharynx moist CV: regular rate and rhythm; no JVD, valvular murmur, no rub/gallops Pulmonary: Clear to auscultation bilaterally, on room air, nonlabored breathing GI: soft, NT, ND, (+)BS Psychiatric: appropriate and cooperative Neurological: Ox3 Extremities: No clubbing, cyanosis, or edema. Pulses are 2+ Skin- no drainage but very bottom of incision is a little wider than rest, no surrounding erythema or warmth. -Not evaluated today Neuro: Alert and oriented x 3, follows commands, cranial nerves II through XII grossly intact, normal speech and language  PRIOR EXAMS: Skin: Back incision Clean and intact , small abrasion on L chin Neuro:  Alert and oriented x 3. Normal insight and awareness. Intact Memory. Normal language and speech. Cranial nerve exam unremarkable. MMT: RUE 5/5. LUE 4+/5 prox to distal. RLE HF 2+/5 with pain inhibition, KE 3+, ADF/PF 4/5. LLE 2- to 2+/5 HF KE with pain component to  weakness and 1+ to 2/5 ADF/PF. Decreased LT and PP bilateral LE's below knee. Intact saddle sensation. DTR's tr-1+. No abnl resting tone. No cerebellar signs. .  Musculoskeletal: Low back tender to palpation and attempts at trunk movement. Pt wearing LSO    Assessment/Plan: 1. Functional deficits which require 3+ hours per day of interdisciplinary therapy in a comprehensive inpatient rehab setting. Physiatrist is providing close team supervision and 24 hour management of active medical problems listed below. Physiatrist and rehab team continue to assess barriers to discharge/monitor patient progress toward functional and medical goals  Care Tool:  Bathing    Body parts bathed by patient: Right arm, Left arm, Chest, Abdomen, Right upper leg, Face, Left upper leg, Front perineal area, Buttocks, Right lower leg, Left lower leg   Body parts bathed by helper: Right lower leg, Left lower leg, Front perineal area, Buttocks     Bathing assist Assist Level: Independent with assistive device     Upper Body Dressing/Undressing Upper body dressing   What is the patient wearing?: Bra, Pull over shirt, Orthosis    Upper body assist Assist Level: Independent with assistive device    Lower  Body Dressing/Undressing Lower body dressing      What is the patient wearing?: Pants, Underwear/pull up     Lower body assist Assist for lower body dressing: Independent with assitive device     Toileting Toileting    Toileting assist Assist for toileting: Independent with assistive device     Transfers Chair/bed transfer  Transfers assist     Chair/bed transfer assist level: Independent with assistive device Chair/bed transfer assistive device:  (rollator and LSO)   Locomotion Ambulation   Ambulation assist      Assist level: Supervision/Verbal cueing Assistive device: Rollator Max distance: 150'   Walk 10 feet activity   Assist     Assist level: Supervision/Verbal  cueing Assistive device: Orthosis, Rollator   Walk 50 feet activity   Assist Walk 50 feet with 2 turns activity did not occur: Safety/medical concerns  Assist level: Supervision/Verbal cueing Assistive device: Rollator, Orthosis    Walk 150 feet activity   Assist Walk 150 feet activity did not occur: Safety/medical concerns  Assist level: Supervision/Verbal cueing Assistive device: Rollator, Orthosis    Walk 10 feet on uneven surface  activity   Assist Walk 10 feet on uneven surfaces activity did not occur: Safety/medical concerns   Assist level: Supervision/Verbal cueing Assistive device: Rollator, Orthosis   Wheelchair     Assist Is the patient using a wheelchair?: No Type of Wheelchair: Manual    Wheelchair assist level: Supervision/Verbal cueing Max wheelchair distance: 100'    Wheelchair 50 feet with 2 turns activity    Assist        Assist Level: Supervision/Verbal cueing   Wheelchair 150 feet activity     Assist      Assist Level: Moderate Assistance - Patient 50 - 74%   Blood pressure (!) 119/52, pulse 99, temperature 98.8 F (37.1 C), temperature source Oral, resp. rate 17, height 5\' 6"  (1.676 m), weight 84.7 kg, SpO2 99%.  Medical Problem List and Plan: 1. 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   -Back corset when out of bed             -patient may shower             -ELOS/Goals: 9-14 days  D/c 4/16  Con't CIR PT and OT Pt fell last night- head CT (-)- just sore this AM- will monitor- d/w nursing- don't think she needs telesitter- just got feet tangled. But will need posey belt I think per policy.  -of note, will not need f/up with me, only surgeon -Team conference today please see physician documentation under team conference tab, met with team  to discuss problems,progress, and goals. Formulized individual treatment  plan based on medical history, underlying problem and comorbidities.   2.  Antithrombotics: -DVT/anticoagulation:  Mechanical: Antiembolism stockings, thigh (TED hose) Bilateral lower extremities  -02/03/24 DVT US negative 4/8- will start Lovenox 40 mg daily since it's been 7 days since surgery- pt only walking ~ 154ft total/day             -antiplatelet therapy: N/A 3. Pain Management: Neurontin 100 mg daily and 200 mg nightly, Flexeril and oxycodone as needed             -might benefit from scheduled oxycodone prior to therapies  4/7- Pt reports pain meds work- doesn't want things changed right now- will monitor  4/10- feels like pain manageable. 4/11- Pt's buttocks are sore/painful and back still hurting-  con't regimen- but might need things increased over weekend  4/14 back/buttocks pain controlled, continue current regimen for now 4. Mood/Behavior/Sleep: Celexa 40 mg daily, Klonopin as needed             -antipsychotic agents: N/A -pt with anxiety about therapy tomorrow. Reassured her that team will work with her at her pace and be considerate of her pain. I also told her family could stay over with her if it helped her anxiety.  -02/03/24 per pharmacy yesterday, pt on trazodone 150mg  nightly-- but reported sleeping well so hold off on starting that for now.  4/9- sleeping well  4/11- start Buspar 5-10 mg TID prn to help with claustrophobia and anxiety- if that doesn't work, has Klonopin-  4/14 pramiprexole timing adjusted to 9pm, she has zolpidem PRN for insomnia- doesn't appear she used last night 5. Neuropsych/cognition: This patient is capable of making decisions on her own behalf. 6. Skin/Wound Care: Routine skin checks 7. Fluids/Electrolytes/Nutrition: Routine in and outs, cont vitamins/supplements             -mild hyponatremia on recent labs- Na 133 on 02/02/24             -f/up labs 4/10 stable with improved Na 137 8.  Acute blood loss anemia on chronic anemia:        -pt transfused  1u PRBC today for hgb of 7.3 -fe++ supp -02/03/24 Hgb 9.1 yesterday, f/up CBC tomorrow.   4/7- Labs pending- will monitor for labs  4/8- Hb stable at 9.3  -4/9 hgb down to 8.6 so monitor closely on Monday  -4/14 HGB stable at 8.8, continue to monitor 9.  History aortic stenosis/MVP.  Status post aortic valve replacement July 2024 per Dr. Cliffton Asters 10.  Hypertension.  Toprol-XL 25 mg twice daily, Lasix 40 mg daily.  Monitor with increased mobility             -controlled at present  4/7- 4/9- BP controlled- con't regimen  4/15 BP controlled but a little soft at times, decrease lasix to 20mg   Vitals:   02/09/24 1903 02/10/24 0348 02/10/24 0935 02/10/24 1150  BP: (!) 105/57 125/71 (!) 111/54 121/70   02/10/24 1312 02/10/24 1811 02/11/24 0356 02/11/24 1244  BP: (!) 111/56 127/70 (!) 140/63 137/72   02/11/24 1852 02/11/24 2030 02/12/24 0559 02/12/24 0730  BP: 133/61 137/60 (!) 104/59 (!) 119/52      11.  Hyperlipidemia.  Lipitor 80mg  daily 12.  Diabetes mellitus with peripheral neuropathy.  Latest hemoglobin A1c 9.7.                -sugars poorly controlled at present as well              -pt is on metformin XR 500mg  2 tablets daily as she takes at home             -SSI             -might need adjustments to her standing regimen  - 4/6 CBGs ok control but a little high in the mornings; will add HS SSI but ultimately might need adjustment to her metformin XR if her sugars are persistently elevated. Per pharmacy note yesterday, she was on insulin glargine 30U HS and mounjaro 7.5mg  weekly-- verify this is what she was taking, and potentially adding the glargine back will help -4/7- will add Lantus 10 units nightly (usually takes at 6pm)- took 45 units daily at home, but I don't think needs that  yet based on CBG's.  4/8- Pt's Cbgs 159 this AM- still in 200's, but will increase Insulin tomorrow if stills needs it 4/9- will increase Lantus to 13 units nightly 4/10- just increased Lantus  yesterday-max CBG 385 yesterday - eating snacks.  4/11- Will increase Lantus to 18 units- since mainly in 200's300s lately- pt was on 45 units at home. Might need titration this weekend -02/09/24 CBGs still very elevated, will increase lantus to 22U  -02/10/24 CBGs a bit more variable but starting to come down; monitor with changes yesterday -4/14 Increase to 24u -4/15 Add 3u mealtime insulin CBG (last 3)  Recent Labs    02/11/24 2059 02/11/24 2115 02/12/24 0600  GLUCAP 349* 330* 160*    14.  Restless leg syndrome.  Mirapex 1 mg nightly 15.  Obesity.  BMI 30.99.  Dietary follow-up 16. Constipation: colace BID, miralax daily  - LBM 4/3, start MiraLAX daily  4/9- LBM yesterday  4/11- LBM 2 days ago  -02/10/24 LBM last night per pt, monitor  -4/14 LBM yesterday  Consider additional medication if no bowel movement today 17. Small abrasion ok chin  -bacitracin, she reports she is allergic to tape 18. Hypokalemia  4/8- K+ 3.4- will report KCL 40 mEq x1- and recheck Thursday  4/10- K+ 3.8  4/14 Stable at 3.8  Decrease lasix, recheck tomorrow  19. Incision itching 4/10- ordered Eucerin to go around incision, not on incision- and took off dressing to help itching, since skin moist- not draining.  20. Claustrophobia  4/11- Will add Buspar 5-10 mg TID prn    LOS: 11 days A FACE TO FACE EVALUATION WAS PERFORMED  Lylia Sand 02/12/2024, 11:04 AM

## 2024-02-12 NOTE — Progress Notes (Signed)
 Patient ID: Stacy Frost, female   DOB: 10-Feb-1961, 63 y.o.   MRN: [id number]  Met wit pt to give her the team conference update regarding goals met of mod/I and discharge tomorrow. Aware OP referral made to [facility]. Pt feels ready to go home.

## 2024-02-12 NOTE — Progress Notes (Signed)
 Inpatient Rehabilitation Care Coordinator Discharge Note   Patient Details  Name: Stacy Frost MRN: [id number] Date of Birth: 08-Jan-1961   Discharge location: HOME WITH HUSBAND WHO AN PROVIDE ASSIST AT HOME  Length of Stay: 12 DAYS  Discharge activity level: MOD/I LEVEL  Home/community participation: ACTIVE  Patient response WU:[id number] Literacy - How often do you need to have someone help you when you read instructions, pamphlets, or other written material from your doctor or pharmacy?: Never  Patient response YN:WGNFAO Isolation - How often do you feel lonely or isolated from those around you?: Rarely  Services provided included: MD, RD, PT, OT, RN, CM, TR, Pharmacy, SW  Financial Services:  Field seismologist Utilized: Private Insurance [employer]  Choices offered to/list presented to: PT  Follow-up services arranged:  Outpatient    Outpatient Servicies: [facility] WILL CALL TO SET UP FOLLOW UP APPOINTMENTS      Patient response to transportation need: Is the patient able to respond to transportation needs?: Yes In the past 12 months, has lack of transportation kept you from medical appointments or from getting medications?: No In the past 12 months, has lack of transportation kept you from meetings, work, or from getting things needed for daily living?: No   Patient/Family verbalized understanding of follow-up arrangements:  Yes  Individual responsible for coordination of the follow-up plan: SELF  [phone]  Confirmed correct DME delivered: Mardell Shade 02/12/2024    Comments (or additional information):PT DID WELL AND REACHED HER GOALS QUICKLY. HUSBAND HAS BEEN IN AND OBSERVED IN THERAPIES.   Summary of Stay    Date/Time Discharge Planning CSW  02/12/24 0912 Reaching goals and ready for discharge tomorrow. OP referral made to [facility] and has DME RGD  02/05/24 0851 Home with husband who just retired and can provide 24/7 care. Pt is  motivated to do well, barrier is her lack of sensation in her leg. RGD       Dezmon Conover G

## 2024-02-12 NOTE — Plan of Care (Signed)
  Problem: SCI BLADDER ELIMINATION Goal: RH STG MANAGE BLADDER WITH ASSISTANCE Description: STG Manage Bladder With Mod I  Assistance Outcome: Progressing   Problem: RH SAFETY Goal: RH STG ADHERE TO SAFETY PRECAUTIONS W/ASSISTANCE/DEVICE Description: STG Adhere to Safety Precautions With  mod I Assistance/Device. Outcome: Progressing

## 2024-02-12 NOTE — Progress Notes (Signed)
 Physical Therapy Session Note  Patient Details  Name: Stacy Frost MRN: [id number] Date of Birth: [birth year]  Today's Date: 02/12/2024 PT Individual Time: 1400-1500 PT Individual Time Calculation (min): 60 min   Short Term Goals: Week 1:  PT Short Term Goal 1 (Week 1): STG=LTG due to ELOS PT Short Term Goal 1 - Progress (Week 1): Progressing toward goal  Skilled Therapeutic Interventions/Progress Updates:    Session focused on preparation for d/c tomorrow with grad day activities, functional mobility retraining, and NMR for balance and strengthening. Pt performed bed mobility and basic transfers with rollator throughout session with overall mod I level and LSO donned. Pt demonstrates appropriate use of brakes and positioning. One instance where pt wanted to leave rollator too far to the side with cues for safer technique. Stair negotiation training for NMR for strength and balance with overall CGA and cues for which foot to lead with. Verbally reviewed curb step negotiation for home entry with rollator. Declines need to practice again from yesterday and feels confident with this. Pt performed functional gait on unit with rollator on unit x 150' mod I with cues provided for improved posture. NMR for balance and strength retraining on compliant surface with 1 UE support while performing functional reaching task across midline x 2 trials each side with supervision. Completed step ups on compliant airex pad x 10 reps each side for balance and strengthening. No episodes of knee buckling. Continued NMR on Nustep with BLE only with trail runner program x 8 min with pt performing avg 54 steps per min. Returned to room mod I and all needs in reach.   Therapy Documentation Precautions:  Precautions Precautions: Back, Fall Recall of Precautions/Restrictions: Impaired (Unable to recall "lift") Precaution/Restrictions Comments: LLE weakness Required Braces or Orthoses: Spinal Brace Spinal Brace: Lumbar  corset, Applied in sitting position Restrictions Weight Bearing Restrictions Per Provider Order: No Pain:  Premedicated for BLE and back pain. Rest breaks as needed but did not impact session. Mobility: Bed Mobility Bed Mobility: Rolling Right;Rolling Left;Supine to Sit;Sit to Supine Rolling Right: Independent with assistive device Rolling Left: Independent with assistive device Supine to Sit: Independent with assistive device Sit to Supine: Independent with assistive device Sit to Sidelying Right: Independent with assistive device Transfers Transfers: Sit to Stand;Stand to Sit;Stand Pivot Transfers Sit to Stand: Independent with assistive device Stand to Sit: Independent with assistive device Stand Pivot Transfers: Independent with assistive device Transfer (Assistive device): Rollator Locomotion : Gait Ambulation: Yes Gait Assistance: Independent with assistive device Gait Distance (Feet): 150 Feet Assistive device: Rollator Gait Gait: Yes Stairs / Additional Locomotion Stairs: Yes Stairs Assistance: Contact Guard/Touching assist Stair Management Technique: Two rails;Step to pattern Number of Stairs: 12 Ramp: Supervision/Verbal cueing Curb: Contact Guard/Touching assist Wheelchair Mobility Wheelchair Mobility: No  Trunk/Postural Assessment : Cervical Assessment Cervical Assessment: Within Functional Limits Thoracic Assessment Thoracic Assessment: Exceptions to [facility] (back precautions) Lumbar Assessment Lumbar Assessment: Exceptions to [facility] (back precautions) Postural Control Protective Responses: decreased due to pain and LLE weakness without UE support  Balance: Static Sitting Balance Static Sitting - Level of Assistance: 6: Modified independent (Device/Increase time) Dynamic Sitting Balance Dynamic Sitting - Level of Assistance: 6: Modified independent (Device/Increase time) Static Standing Balance Static Standing - Level of Assistance: 6: Modified independent  (Device/Increase time) Dynamic Standing Balance Dynamic Standing - Level of Assistance: 6: Modified independent (Device/Increase time)    Therapy/Group: Individual Therapy  Karolee Stamps Darrol Poke, PT, DPT, CBIS  02/12/2024, 3:30 PM

## 2024-02-12 NOTE — Progress Notes (Signed)
 Physical Therapy Session Note  Patient Details  Name: Stacy Frost MRN: [id number] Date of Birth: 17-May-1961  Today's Date: 02/12/2024 PT Individual Time: 1107-1201 PT Individual Time Calculation (min): 54 min   Short Term Goals: Week 1:  PT Short Term Goal 1 (Week 1): STG=LTG due to ELOS PT Short Term Goal 1 - Progress (Week 1): Progressing toward goal  Skilled Therapeutic Interventions/Progress Updates: Patient sitting EOB on entrance to room. Patient alert and agreeable to PT session.   Patient reported 7/10 pain in low back and L leg (pins and needles). Pt given pain medication earlier in day. Rest provided prn. Provided pt with HEP as indicated bellow.   Therapeutic Activity: Transfers: Pt performed sit<>stand transfers throughout session with rollator and supervision and VC to reach back to sitting surfaces. Pt ambulated throughout session in rollator with CGA/close supervision. Several moments of L knee buckle occurred with pt able to avoid fall.   Access Code: [email or web address] URL: [email or web address] Date: 02/12/2024 Prepared by: Seferino Dade  Exercises - Seated Knee Lifts with Resistance  - 1 x daily - 7 x weekly - 3 sets - 10 reps - 3 second hold - Seated Knee Extension with Resistance  - 1 x daily - 7 x weekly - 3 sets - 10 reps - 3 second hold  TUG (avg = 18.62s) with rollator and supervision. Pt educated following outcome measure to reach back to arm rest to control eccentric vs having B UE on rollator.  - 22.79s  -17.68s  - 15.39s   Therapeutic Exercise: Pt performed the following exercises with therapist providing the described cuing and facilitation for improvement. - Seated LAQ with L LE and yellow theraband around ankles x 10 (pt also performed x 10 without resistance initially) with VC to avoid compensating with hip extension. 3s isometric holds - Seated march with L LE x 10 (yellow theraband provided too much resistance. Pt informed to perform this  as HEP, then progress to yellow theraband after building strength) with 3s isometric hold with controlled eccentric -NuStep - 8 min with 386 steps performed. 40 avg spm on level 5 for close to 5 min, then level 2 for remainder due to L LE fatiguing. 1.6 avg METs   Patient sitting EOB at end of session with brakes locked, modI in room and all needs within reach.      Therapy Documentation Precautions:  Precautions Precautions: Back, Fall Recall of Precautions/Restrictions: Impaired (Unable to recall "lift") Precaution/Restrictions Comments: LLE weakness Required Braces or Orthoses: Spinal Brace Spinal Brace: Lumbar corset, Applied in sitting position Restrictions Weight Bearing Restrictions Per Provider Order: No  Therapy/Group: Individual Therapy  Shalamar Crays PTA 02/12/2024, 12:54 PM

## 2024-02-12 NOTE — Plan of Care (Signed)
  Problem: SCI BOWEL ELIMINATION Goal: RH STG MANAGE BOWEL WITH ASSISTANCE Description: STG Manage Bowel with mod I Assistance. Outcome: Progressing   Problem: SCI BLADDER ELIMINATION Goal: RH STG MANAGE BLADDER WITH ASSISTANCE Description: STG Manage Bladder With Mod I  Assistance Outcome: Progressing   Problem: RH SKIN INTEGRITY Goal: RH STG SKIN FREE OF INFECTION/BREAKDOWN Description: Manage  skin free of infection/breakdown with mod I assistance Outcome: Progressing   Problem: RH SAFETY Goal: RH STG ADHERE TO SAFETY PRECAUTIONS W/ASSISTANCE/DEVICE Description: STG Adhere to Safety Precautions With  mod I Assistance/Device. Outcome: Progressing   Problem: RH PAIN MANAGEMENT Goal: RH STG PAIN MANAGED AT OR BELOW PT'S PAIN GOAL Description: <4 w/ prns Outcome: Progressing   Problem: RH KNOWLEDGE DEFICIT SCI Goal: RH STG INCREASE KNOWLEDGE OF SELF CARE AFTER SCI Description: Manage  increase knowledge f self care after SCI with mod I assistance using educational materials provided Outcome: Progressing

## 2024-02-12 NOTE — Plan of Care (Signed)
  Problem: RH Balance Goal: LTG Patient will maintain dynamic standing with ADLs (OT) Description: LTG:  Patient will maintain dynamic standing balance with assist during activities of daily living (OT)  Outcome: Completed/Met Flowsheets (Taken 02/02/2024 1226 by Artemus Biles M, OT) LTG: Pt will maintain dynamic standing balance during ADLs with: Independent with assistive device   Problem: RH Bathing Goal: LTG Patient will bathe all body parts with assist levels (OT) Description: LTG: Patient will bathe all body parts with assist levels (OT) Outcome: Completed/Met Flowsheets (Taken 02/02/2024 1226 by Burleigh Carp, OT) LTG: Pt will perform bathing with assistance level/cueing: Independent with assistive device    Problem: RH Toileting Goal: LTG Patient will perform toileting task (3/3 steps) with assistance level (OT) Description: LTG: Patient will perform toileting task (3/3 steps) with assistance level (OT)  Outcome: Completed/Met Flowsheets (Taken 02/02/2024 1226 by Burleigh Carp, OT) LTG: Pt will perform toileting task (3/3 steps) with assistance level: Independent with assistive device   Problem: RH Simple Meal Prep Goal: LTG Patient will perform simple meal prep w/assist (OT) Description: LTG: Patient will perform simple meal prep with assistance, with/without cues (OT). Outcome: Completed/Met Flowsheets (Taken 02/02/2024 1226 by Burleigh Carp, OT) LTG: Pt will perform simple meal prep with assistance level of: Independent with assistive device   Problem: RH Light Housekeeping Goal: LTG Patient will perform light housekeeping w/assist (OT) Description: LTG: Patient will perform light housekeeping with assistance, with/without cues (OT). Outcome: Completed/Met Flowsheets (Taken 02/02/2024 1226 by Burleigh Carp, OT) LTG: Pt will perform light housekeeping with assistance level of: Independent with assistive device   Problem: RH Toilet  Transfers Goal: LTG Patient will perform toilet transfers w/assist (OT) Description: LTG: Patient will perform toilet transfers with assist, with/without cues using equipment (OT) Outcome: Completed/Met Flowsheets (Taken 02/02/2024 1226 by Burleigh Carp, OT) LTG: Pt will perform toilet transfers with assistance level of: Independent with assistive device   Problem: RH Tub/Shower Transfers Goal: LTG Patient will perform tub/shower transfers w/assist (OT) Description: LTG: Patient will perform tub/shower transfers with assist, with/without cues using equipment (OT) Outcome: Completed/Met Flowsheets (Taken 02/12/2024 1220) LTG: Pt will perform tub/shower stall transfers with assistance level of: Independent with assistive device

## 2024-02-13 ENCOUNTER — Other Ambulatory Visit (HOSPITAL_COMMUNITY): Payer: Self-pay

## 2024-02-13 LAB — BASIC METABOLIC PANEL WITH GFR
Anion gap: 13 (ref 5–15)
BUN: 10 mg/dL (ref 8–23)
CO2: 27 mmol/L (ref 22–32)
Calcium: 9 mg/dL (ref 8.9–10.3)
Chloride: 98 mmol/L (ref 98–111)
Creatinine, Ser: 0.85 mg/dL (ref 0.44–1.00)
GFR, Estimated: >60 mL/min (ref >60)
Glucose, Bld: 196 mg/dL — ABNORMAL HIGH (ref 70–99)
Potassium: 4.1 mmol/L (ref 3.5–5.1)
Sodium: 138 mmol/L (ref 135–145)

## 2024-02-13 LAB — GLUCOSE, CAPILLARY: Glucose-Capillary: 178 mg/dL — ABNORMAL HIGH (ref 70–99)

## 2024-02-13 MED ORDER — FUROSEMIDE 20 MG PO TABS
20.0000 mg | ORAL_TABLET | Freq: Every day | ORAL | 0 refills | Status: DC
  Filled 2024-02-13: qty 30, 30d supply, fill #0

## 2024-02-13 NOTE — Progress Notes (Signed)
 Patient has requested pain medication twice so far this shift for back pain. She is independent in her room. Her husband is at bedside over night. Surgical site to back is open to air. Peri wound is pink. No warmth or drainage noted. Pt will be going home 02/13/24. She and husband are aware and have good understanding of safety and fall prevention at home. She slept well this shift.  No concerns at this time.

## 2024-02-13 NOTE — Progress Notes (Signed)
 INPATIENT REHABILITATION DISCHARGE NOTE   Discharge instructions by: Genella Kendall PA  Verbalized understanding: yes   Skin care/Wound care healing? N/a  Pain: none  IV's: none  Tubes/Drains:none  O2: none  Safety instructions: completed, pt in agreement  Patient belongings: gathered and taken with pt/family  Discharged to: home  Discharged via: private vehicle  Notes: TOC meds returned other meds sent to [facility]

## 2024-02-13 NOTE — Progress Notes (Signed)
 Inpatient Rehabilitation Discharge Medication Review by a Pharmacist  A complete drug regimen review was completed for this patient to identify any potential clinically significant medication issues.  High Risk Drug Classes Is patient taking? Indication by Medication  Antipsychotic No   Anticoagulant No   Antibiotic No   Opioid Yes Oxycodone - acute pain  Antiplatelet Yes  ASA 325- aortic stenosis   Hypoglycemics/insulin Yes Sliding Scale Insulin, semglee, mopunjaro, Metformin - DM  Vasoactive Medication Yes Metoprolol XL - HTN  Chemotherapy No   Other No APAP - pain Atorvastatin - HLD Citalopram - mood Lasix - Diuresis Gabapentin -  neuropathic pain Pramipexole - restless legs Clonazepam - anxiety Flexeril - muscle spasm Miralax- bowel regimen Fe, KCl - supplement Trazodone - sleep     Type of Medication Issue Identified Description of Issue Recommendation(s)  Drug Interaction(s) (clinically significant)     Duplicate Therapy     Allergy     No Medication Administration End Date     Incorrect Dose     Additional Drug Therapy Needed     Significant med changes from prior encounter (inform family/care partners about these prior to discharge). The following medications were intended to be resumed following acute admission:  Tylenol #3 1 tab PO bid prn- discontinued Glargine Insulin 30 units hs>>decreased to 24 units at d/c K20 PO daily Prometh 25 bid prn nausea- discontinued  The following medications were discontinued on acute admission: Celebrex 200mg  bid Please review and resume as appropriate.    Other       Clinically significant medication issues were identified that warrant physician communication and completion of prescribed/recommended actions by midnight of the next day:  No  Name of provider notified for urgent issues identified:   Provider Method of Notification:     Pharmacist comments:   Time spent performing this drug regimen review  (minutes):  20   Chrystie Crass, PharmD Clinical Pharmacist  02/13/2024 8:10 AM

## 2024-02-13 NOTE — Patient Care Conference (Signed)
 Inpatient RehabilitationTeam Conference and Plan of Care Update Date: 02/12/2024   Time: 1140 am    Patient Name: Stacy Frost      Medical Record Number: [id number]  Date of Birth: [birth year] Sex: Female         Room/Bed: 4M02C/4M02C-01 Payor Info: Payor: Raina Bunting / Plan: Salomon Cree / Product Type: *No Product type* /    Admit Date/Time:  02/01/2024  3:30 PM  Primary Diagnosis:  Lumbar radiculopathy  Hospital Problems: Principal Problem:   Lumbar radiculopathy    Expected Discharge Date: Expected Discharge Date: 02/13/24  Team Members Present: Physician leading conference: Dr. Lylia Sand Social Worker Present: Adrianna Albee, LCSW Nurse Present: Jerene Monks, RN PT Present: Gita Lamb, PT OT Present: Nila Barth, OT PPS Coordinator present : Jestine Moron, SLP     Current Status/Progress Goal Weekly Team Focus  Bowel/Bladder      Continent of bowel and bladder    Remain continent of bowel and bladder    Assess bowel and bladder q shift  Swallow/Nutrition/ Hydration               ADL's   Mod I overall with increased time with rollator   mod I overall, supervision tub/shower transfers   D/C planning, HEP    Mobility   supervision overall with rollator; CGA for stairs   mod I overall for transfers and gait; CGA for stairs  D/c planning, functional strengthening and balance retraining, endurance, education for HEP/discharge    Communication                Safety/Cognition/ Behavioral Observations               Pain      Back pain controlled with pain med    <4 w/ prns    Assess pain q shift   Skin      Back incision   Incision free from infection entire stay on rehab    Assess incision q shift    Discharge Planning:  Reaching goals and ready for discharge tomorrow. OP referral made to [facility] and has DME    Team Discussion: Patient  was admitted  status post decompression and PLIF due to multilevel radiculopathy (L2-L5) with  severe stenosis/spondylolisthesis. Patient limited by pain, weakness, and buckling of left lower leg.   Patient on target to meet rehab goals: Yes, patient requires Mod I with ADLs and transfers. Patient requires supervision with mobility. Overall goals for discharge are set for Mod I- supervision.   *See Care Plan and progress notes for long and short-term goals.   Revisions to Treatment Plan:  N/a    Teaching Needs: Safety, dietary modifications, diabetic education, incision care, transfers, toileting, medications,lumbar corset education, etc.    Current Barriers to Discharge: Decreased caregiver support  Possible Resolutions to Barriers: Family education DME: Occupational hygienist, TTB  Outpatient follow-up Home Health Exercise Program     Medical Summary Current Status: multilevel radiculopathy (L2-L5) d/t severe stenosis/spondylolisthesis, insomnia, HTN, DM2, constipation, hypokalemia  Barriers to Discharge: Medical stability;Self-care education;Uncontrolled Diabetes  Barriers to Discharge Comments: multilevel radiculopathy (L2-L5) d/t severe stenosis/spondylolisthesis, insomnia, HTN, DM2, constipation, hypokalemia Possible Resolutions to [employer] Focus: Decrease lasix, monitor K+, adjust insulin dose   Continued Need for Acute Rehabilitation Level of Care: The patient requires daily medical management by a physician with specialized training in physical medicine and rehabilitation for the following reasons: Direction of a multidisciplinary physical rehabilitation program to maximize functional independence : Yes Medical management of  patient stability for increased activity during participation in an intensive rehabilitation regime.: Yes Analysis of laboratory values and/or radiology reports with any subsequent need for medication adjustment and/or medical intervention. : Yes   I attest that I was present, lead the team conference, and concur with the assessment and plan of the  team.   Jerene Monks 02/12/2024, 1140am

## 2024-02-13 NOTE — Progress Notes (Signed)
 PROGRESS NOTE   Subjective/Complaints: No new concerns, looking forward to DC home.    ROS:    Pt denies fever, chills, SOB, abd pain, CP, N/V/C/D, and vision changes + Insomnia- improved    Objective:   No results found.    Recent Labs    02/11/24 0520  WBC 7.5  HGB 8.8*  HCT 26.7*  PLT 437*    Recent Labs    02/11/24 0520 02/13/24 0529  NA 138 138  K 3.8 4.1  CL 100 98  CO2 30 27  GLUCOSE 210* 196*  BUN 11 10  CREATININE 0.78 0.85  CALCIUM 8.8* 9.0     Intake/Output Summary (Last 24 hours) at 02/13/2024 1434 Last data filed at 02/13/2024 0732 Gross per 24 hour  Intake 360 ml  Output --  Net 360 ml        Physical Exam: Vital Signs Blood pressure 120/77, pulse 90, temperature 97.8 F (36.6 C), temperature source Oral, resp. rate 17, height 5\' 6"  (1.676 m), weight 84.7 kg, SpO2 98%.   General: awake, alert, appropriate, Sitting in WC, husband in the room  HENT: conjugate gaze; oropharynx moist CV: regular rate and rhythm; no JVD, valvular murmur, no rub/gallops Pulmonary: Clear to auscultation bilaterally, on room air GI: soft, NT, ND, normoactive BS Psychiatric: appropriate and cooperative Neurological: Ox3 Extremities: No clubbing, cyanosis, or edema. Pulses are 2+ Neuro: Alert and oriented x 3, follows commands, cranial nerves II through XII grossly intact, normal speech and language. No hypertonia noted   PRIOR EXAMS: Skin: Back incision Clean and intact , small abrasion on L chin Neuro:  Alert and oriented x 3. Normal insight and awareness. Intact Memory. Normal language and speech. Cranial nerve exam unremarkable. MMT: RUE 5/5. LUE 4+/5 prox to distal. RLE HF 2+/5 with pain inhibition, KE 3+, ADF/PF 4/5. LLE 2- to 2+/5 HF KE with pain component to weakness and 1+ to 2/5 ADF/PF. Decreased LT and PP bilateral LE's below knee. Intact saddle sensation. DTR's tr-1+. No abnl resting tone.  No cerebellar signs. .  Musculoskeletal: Low back tender to palpation and attempts at trunk movement. Pt wearing LSO    Assessment/Plan: 1. Functional deficits which require 3+ hours per day of interdisciplinary therapy in a comprehensive inpatient rehab setting. Physiatrist is providing close team supervision and 24 hour management of active medical problems listed below. Physiatrist and rehab team continue to assess barriers to discharge/monitor patient progress toward functional and medical goals  Care Tool:  Bathing    Body parts bathed by patient: Right arm, Left arm, Chest, Abdomen, Right upper leg, Face, Left upper leg, Front perineal area, Buttocks, Right lower leg, Left lower leg   Body parts bathed by helper: Right lower leg, Left lower leg, Front perineal area, Buttocks     Bathing assist Assist Level: Independent with assistive device     Upper Body Dressing/Undressing Upper body dressing   What is the patient wearing?: Bra, Pull over shirt, Orthosis    Upper body assist Assist Level: Independent with assistive device    Lower Body Dressing/Undressing Lower body dressing      What is the patient wearing?: Pants, Underwear/pull up  Lower body assist Assist for lower body dressing: Independent with assitive device     Toileting Toileting    Toileting assist Assist for toileting: Independent with assistive device     Transfers Chair/bed transfer  Transfers assist     Chair/bed transfer assist level: Independent with assistive device Chair/bed transfer assistive device:  (rollator and LSO)   Locomotion Ambulation   Ambulation assist      Assist level: Independent with assistive device Assistive device: Rollator Max distance: 150'   Walk 10 feet activity   Assist     Assist level: Independent with assistive device Assistive device: Orthosis, Rollator   Walk 50 feet activity   Assist Walk 50 feet with 2 turns activity did not occur:  Safety/medical concerns  Assist level: Independent with assistive device Assistive device: Rollator, Orthosis    Walk 150 feet activity   Assist Walk 150 feet activity did not occur: Safety/medical concerns  Assist level: Independent with assistive device Assistive device: Rollator, Orthosis    Walk 10 feet on uneven surface  activity   Assist Walk 10 feet on uneven surfaces activity did not occur: Safety/medical concerns   Assist level: Supervision/Verbal cueing Assistive device: Rollator, Orthosis   Wheelchair     Assist Is the patient using a wheelchair?: No Type of Wheelchair: Manual    Wheelchair assist level: Supervision/Verbal cueing Max wheelchair distance: 100'    Wheelchair 50 feet with 2 turns activity    Assist        Assist Level: Supervision/Verbal cueing   Wheelchair 150 feet activity     Assist      Assist Level: Moderate Assistance - Patient 50 - 74%   Blood pressure 120/77, pulse 90, temperature 97.8 F (36.6 C), temperature source Oral, resp. rate 17, height 5\' 6"  (1.676 m), weight 84.7 kg, SpO2 98%.  Medical Problem List and Plan: 1. 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   -Back corset when out of bed             -patient may shower             -ELOS/Goals: 9-14 days  D/c 4/16  Con't CIR PT and OT Pt fell last night- head CT (-)- just sore this AM- will monitor- d/w nursing- don't think she needs telesitter- just got feet tangled. But will need posey belt I think per policy.  -of note, will not need f/up with me, only surgeon -Team conference today please see physician documentation under team conference tab, met with team  to discuss problems,progress, and goals. Formulized individual treatment plan based on medical history, underlying problem and comorbidities.  -DC home today  2.   Antithrombotics: -DVT/anticoagulation:  Mechanical: Antiembolism stockings, thigh (TED hose) Bilateral lower extremities  -02/03/24 DVT US negative 4/8- will start Lovenox 40 mg daily since it's been 7 days since surgery- pt only walking ~ 145ft total/day             -antiplatelet therapy: N/A 3. Pain Management: Neurontin 100 mg daily and 200 mg nightly, Flexeril and oxycodone as needed             -might benefit from scheduled oxycodone prior to therapies  4/7- Pt reports pain meds work- doesn't want things changed right now- will monitor  4/10- feels like pain manageable. 4/11- Pt's buttocks are sore/painful and back still hurting- con't regimen- but might need things increased over weekend  4/14 back/buttocks  pain controlled, continue current regimen for now 4. Mood/Behavior/Sleep: Celexa 40 mg daily, Klonopin as needed             -antipsychotic agents: N/A -pt with anxiety about therapy tomorrow. Reassured her that team will work with her at her pace and be considerate of her pain. I also told her family could stay over with her if it helped her anxiety.  -02/03/24 per pharmacy yesterday, pt on trazodone 150mg  nightly-- but reported sleeping well so hold off on starting that for now.  4/9- sleeping well  4/11- start Buspar 5-10 mg TID prn to help with claustrophobia and anxiety- if that doesn't work, has Klonopin-  4/14 pramiprexole timing adjusted to 9pm, she has zolpidem PRN for insomnia- doesn't appear she used last night Insomnia improved 5. Neuropsych/cognition: This patient is capable of making decisions on her own behalf. 6. Skin/Wound Care: Routine skin checks 7. Fluids/Electrolytes/Nutrition: Routine in and outs, cont vitamins/supplements             -mild hyponatremia on recent labs- Na 133 on 02/02/24             -f/up labs 4/10 stable with improved Na 137 8.  Acute blood loss anemia on chronic anemia:        -pt transfused 1u PRBC today for hgb of 7.3 -fe++ supp -02/03/24 Hgb 9.1  yesterday, f/up CBC tomorrow.   4/7- Labs pending- will monitor for labs  4/8- Hb stable at 9.3  -4/9 hgb down to 8.6 so monitor closely on Monday  -4/14 HGB stable at 8.8, continue to monitor 9.  History aortic stenosis/MVP.  Status post aortic valve replacement July 2024 per Dr. Cliffton Asters 10.  Hypertension.  Toprol-XL 25 mg twice daily, Lasix 40 mg daily.  Monitor with increased mobility             -controlled at present  4/7- 4/9- BP controlled- con't regimen  4/15 BP controlled but a little soft at times, decrease lasix to 20mg    4/16 BP stable, continue current regimen Vitals:   02/10/24 1312 02/10/24 1811 02/11/24 0356 02/11/24 1244  BP: (!) 111/56 127/70 (!) 140/63 137/72   02/11/24 1852 02/11/24 2030 02/12/24 0559 02/12/24 0730  BP: 133/61 137/60 (!) 104/59 (!) 119/52   02/12/24 1301 02/12/24 1934 02/13/24 0535 02/13/24 0624  BP: (!) 140/65 122/77 128/69 120/77      11.  Hyperlipidemia.  Lipitor 80mg  daily 12.  Diabetes mellitus with peripheral neuropathy.  Latest hemoglobin A1c 9.7.                -sugars poorly controlled at present as well              -pt is on metformin XR 500mg  2 tablets daily as she takes at home             -SSI             -might need adjustments to her standing regimen  - 4/6 CBGs ok control but a little high in the mornings; will add HS SSI but ultimately might need adjustment to her metformin XR if her sugars are persistently elevated. Per pharmacy note yesterday, she was on insulin glargine 30U HS and mounjaro 7.5mg  weekly-- verify this is what she was taking, and potentially adding the glargine back will help -4/7- will add Lantus 10 units nightly (usually takes at 6pm)- took 45 units daily at home, but I don't think needs that yet based on CBG's.  4/8- Pt's Cbgs 159 this AM- still in 200's, but will increase Insulin tomorrow if stills needs it 4/9- will increase Lantus to 13 units nightly 4/10- just increased Lantus yesterday-max CBG 385  yesterday - eating snacks.  4/11- Will increase Lantus to 18 units- since mainly in 200's300s lately- pt was on 45 units at home. Might need titration this weekend -02/09/24 CBGs still very elevated, will increase lantus to 22U  -02/10/24 CBGs a bit more variable but starting to come down; monitor with changes yesterday -4/14 Increase to 24u -4/15 Add 3u mealtime insulin 4-16 CBGs  improved, f/u with PCP outpatient  CBG (last 3)  Recent Labs    02/12/24 1633 02/12/24 2123 02/13/24 0617  GLUCAP 144* 231* 178*    14.  Restless leg syndrome.  Mirapex 1 mg nightly 15.  Obesity.  BMI 30.99.  Dietary follow-up 16. Constipation: colace BID, miralax daily  - LBM 4/3, start MiraLAX daily  4/9- LBM yesterday  4/11- LBM 2 days ago  -02/10/24 LBM last night per pt, monitor  -LBM 4/14, continue miralax and colace 17. Small abrasion ok chin  -bacitracin, she reports she is allergic to tape 18. Hypokalemia  4/8- K+ 3.4- will report KCL 40 mEq x1- and recheck Thursday  4/10- K+ 3.8  4/14 Stable at 3.8  4/16 stable K+ at 4.1, f/u with PCP for continued monitoring   19. Incision itching 4/10- ordered Eucerin to go around incision, not on incision- and took off dressing to help itching, since skin moist- not draining.  20. Claustrophobia  4/11- Will add Buspar 5-10 mg TID prn    LOS: 12 days A FACE TO FACE EVALUATION WAS PERFORMED  Stacy Frost 02/13/2024, 2:34 PM

## 2024-02-18 ENCOUNTER — Telehealth: Payer: Self-pay

## 2024-02-18 NOTE — Telephone Encounter (Signed)
 Transition Care Management Unsuccessful Follow-up Telephone Call  Date of discharge and from where:  [facility] on 02/13/2024  Attempts:  1st Attempt ,2 Attempt  Reason for unsuccessful TCM follow-up call:  Left voice message

## 2024-02-21 ENCOUNTER — Encounter: Payer: Self-pay | Admitting: Radiology

## 2024-02-25 ENCOUNTER — Encounter: Admitting: Registered Nurse

## 2024-02-25 NOTE — Progress Notes (Deleted)
 Subjective:    Patient ID: Stacy Frost, female    DOB: [birth year], 63 y.o.   MRN: [id number]  HPI   Pain Inventory Average Pain {NUMBERS; 0-10:5044} Pain Right Now {NUMBERS; 0-10:5044} My pain is {PAIN DESCRIPTION:21022940}  In the last 24 hours, has pain interfered with the following? General activity {NUMBERS; 0-10:5044} Relation with others {NUMBERS; 0-10:5044} Enjoyment of life {NUMBERS; 0-10:5044} What TIME of day is your pain at its worst? {time of day:24191} Sleep (in general) {BHH GOOD/FAIR/POOR:22877}  Pain is worse with: {ACTIVITIES:21022942} Pain improves with: {PAIN IMPROVES MWUX:32440102} Relief from Meds: {NUMBERS; 0-10:5044}  {MOBILITY VOZ:36644034}  {FUNCTION:21022946}  {NEURO/PSYCH:21022948}  {CPRM PRIOR STUDIES:21022953}  {CPRM PHYSICIANS INVOLVED IN YOUR CARE:21022954}    Family History  Problem Relation Age of Onset   Depression Mother    Cancer Mother        Lung mets (unsure as to what kind)   Diabetes Mother    Hypertension Father    Depression Father    Heart attack Father    Depression Daughter    Anxiety disorder Daughter    Stroke Daughter    Transient ischemic attack Brother    Emphysema Maternal Grandfather    Brain cancer Paternal Grandmother    Heart attack Paternal Grandfather    Social History   Socioeconomic History   Marital status: Married    Spouse name: Mont Antis   Number of children: 2   Years of education: Not on file   Highest education level: Some college, no degree  Occupational History   Occupation: Arts development officer  Tobacco Use   Smoking status: Never   Smokeless tobacco: Never  Vaping Use   Vaping status: Never Used  Substance and Sexual Activity   Alcohol use: No    Alcohol/week: 0.0 standard drinks of alcohol   Drug use: No   Sexual activity: Yes  Other Topics Concern   Not on file  Social History Narrative   Married, lives with husband in a one level home. They recently got a puppy. She avoids caffeine,  does not exercise.    Social Drivers of Corporate investment banker Strain: Not on file  Food Insecurity: No Food Insecurity (02/01/2024)   Hunger Vital Sign    Worried About Running Out of Food in the Last Year: Never true    Ran Out of Food in the Last Year: Never true  Transportation Needs: No Transportation Needs (02/01/2024)   PRAPARE - Administrator, Civil Service (Medical): No    Lack of Transportation (Non-Medical): No  Physical Activity: Not on file  Stress: No Stress Concern Present (11/27/2019)   Received from [facility], [facility] - Occupational Stress Questionnaire    Feeling of Stress : Not at all  Social Connections: Not on file   Past Surgical History:  Procedure Laterality Date   ACHILLES TENDON REPAIR  2003   Left   AORTIC VALVE REPLACEMENT N/A 05/14/2023   Procedure: AORTIC VALVE REPLACEMENT (AVR);  Surgeon: Hilarie Lovely, MD;  Location: [facility] OR;  Service: Open Heart Surgery;  Laterality: N/A;   CARDIAC CATHETERIZATION  2008   Normal coronary arteries; normal LV systolic function; mild dilation of aortic root; mild dilation of proximal ascending aorta; likely bicuspid aortic valve   CHOLECYSTECTOMY  2001   "Poor EF at 17%"   COLONOSCOPY N/A 07/17/2014   Procedure: COLONOSCOPY;  Surgeon: Ruby Corporal, MD;  Location: [facility] END[facility] SUITE;  Service: Endoscopy;  Laterality: N/A;  105   ESOPHAGEAL DILATION N/A 06/11/2015   Procedure: ESOPHAGEAL DILATION;  Surgeon: Ruby Corporal, MD;  Location: [facility] ORS;  Service: Endoscopy;  Laterality: N/ALonda Rival 54/56/58   ESOPHAGOGASTRODUODENOSCOPY N/A 07/17/2014   Procedure: ESOPHAGOGASTRODUODENOSCOPY (EGD);  Surgeon: Ruby Corporal, MD;  Location: [facility];  Service: Endoscopy;  Laterality: N/A;   ESOPHAGOGASTRODUODENOSCOPY (EGD) WITH PROPOFOL  N/A 06/11/2015   Procedure: ESOPHAGOGASTRODUODENOSCOPY (EGD) WITH PROPOFOL ;  Surgeon: Ruby Corporal, MD;   Location: [facility] ORS;  Service: Endoscopy;  Laterality: N/A;  730   KNEE ARTHROSCOPY WITH MEDIAL MENISECTOMY Right 07/02/2018   Procedure: RIGHT KNEE ARTHROSCOPY, DEBRIDEMENT, MEDIAL MENISECTOMY;  Surgeon: Shirlee Dotter, MD;  Location: [facility] OR;  Service: Orthopedics;  Laterality: Right;   KNEE SURGERY     MALONEY DILATION N/A 07/17/2014   Procedure: MALONEY DILATION;  Surgeon: Ruby Corporal, MD;  Location: [facility];  Service: Endoscopy;  Laterality: N/A;   RIGHT/LEFT HEART CATH AND CORONARY ANGIOGRAPHY N/A 05/02/2023   Procedure: RIGHT/LEFT HEART CATH AND CORONARY ANGIOGRAPHY;  Surgeon: [place], Peter M, MD;  Location: [facility] INVASIVE CV LAB;  Service: Cardiovascular;  Laterality: N/A;   TEE WITHOUT CARDIOVERSION N/A 05/14/2023   Procedure: TRANSESOPHAGEAL ECHOCARDIOGRAM;  Surgeon: Hilarie Lovely, MD;  Location: [facility] OR;  Service: Open Heart Surgery;  Laterality: N/A;   UMBILICAL HERNIA REPAIR  2003   Past Medical History:  Diagnosis Date   Anxiety    Aortic stenosis    Mild to moderate   Bicuspid aortic valve    Essential hypertension, benign    GERD (gastroesophageal reflux disease)    Heart murmur    S/P Aortic Valve Replacement July 2024   History of cardiac catheterization    Normal coronaries 2008   Hyperlipidemia    Migraine headache    MVP (mitral valve prolapse)    Obesity    Pain in joint, ankle and foot    Chronic   Palpitations    Documented PACs by previous monitoring   Restless leg syndrome    Skin tag    Type 2 diabetes mellitus (HCC)    Unilateral primary osteoarthritis, right knee 09/06/2016   There were no vitals taken for this visit.  Opioid Risk Score:   Fall Risk Score:  `1  Depression screen [facility] 2/9     09/14/2023    9:35 AM 07/10/2023    8:18 AM  Depression screen PHQ 2/9  Decreased Interest 0 0  Down, Depressed, Hopeless 0 0  PHQ - 2 Score 0 0  Altered sleeping 0 0  Tired, decreased energy 2 0  Change in appetite 0 0  Feeling bad or  failure about yourself  0 0  Trouble concentrating 0 0  Moving slowly or fidgety/restless 0 0  Suicidal thoughts 0 0  PHQ-9 Score 2 0  Difficult doing work/chores Somewhat difficult Not difficult at all    Review of Systems     Objective:   Physical Exam        Assessment & Plan:

## 2024-03-13 ENCOUNTER — Encounter: Attending: Registered Nurse | Admitting: Registered Nurse

## 2024-03-14 ENCOUNTER — Other Ambulatory Visit: Payer: Self-pay

## 2024-03-16 ENCOUNTER — Other Ambulatory Visit: Payer: Self-pay | Admitting: Internal Medicine

## 2024-03-18 ENCOUNTER — Other Ambulatory Visit: Payer: Self-pay

## 2024-04-14 ENCOUNTER — Other Ambulatory Visit: Payer: Self-pay | Admitting: Nurse Practitioner

## 2024-04-14 DIAGNOSIS — E1165 Type 2 diabetes mellitus with hyperglycemia: Secondary | ICD-10-CM

## 2024-04-17 ENCOUNTER — Encounter: Payer: Self-pay | Admitting: Nurse Practitioner

## 2024-04-17 ENCOUNTER — Ambulatory Visit (INDEPENDENT_AMBULATORY_CARE_PROVIDER_SITE_OTHER): Admitting: Nurse Practitioner

## 2024-04-17 VITALS — BP 118/72 | HR 74 | Ht 66.0 in | Wt 195.6 lb

## 2024-04-17 DIAGNOSIS — I10 Essential (primary) hypertension: Secondary | ICD-10-CM | POA: Diagnosis not present

## 2024-04-17 DIAGNOSIS — Z794 Long term (current) use of insulin: Secondary | ICD-10-CM | POA: Diagnosis not present

## 2024-04-17 DIAGNOSIS — Z7985 Long-term (current) use of injectable non-insulin antidiabetic drugs: Secondary | ICD-10-CM | POA: Diagnosis not present

## 2024-04-17 DIAGNOSIS — E1165 Type 2 diabetes mellitus with hyperglycemia: Secondary | ICD-10-CM

## 2024-04-17 DIAGNOSIS — Z7984 Long term (current) use of oral hypoglycemic drugs: Secondary | ICD-10-CM | POA: Diagnosis not present

## 2024-04-17 LAB — POCT GLYCOSYLATED HEMOGLOBIN (HGB A1C): Hemoglobin A1C: 9.5 % — AB (ref 4.0–5.6)

## 2024-04-17 MED ORDER — METFORMIN HCL ER 500 MG PO TB24: 1000.0000 mg | ORAL_TABLET | Freq: Every day | ORAL | 3 refills | Status: DC

## 2024-04-17 MED ORDER — MOUNJARO 7.5 MG/0.5ML ~~LOC~~ SOAJ: 7.5000 mg | SUBCUTANEOUS | 1 refills | Status: DC

## 2024-04-17 MED ORDER — DEXCOM G7 SENSOR MISC: 3 refills | Status: AC

## 2024-04-17 MED ORDER — INSULIN GLARGINE-YFGN 100 UNIT/ML ~~LOC~~ SOLN: 45.0000 [IU] | Freq: Every day | SUBCUTANEOUS | 3 refills | Status: DC

## 2024-04-17 NOTE — Progress Notes (Signed)
 Endocrinology Follow Up Note       04/17/2024, 12:00 PM   Subjective:    Patient ID: Stacy Frost, female    DOB: [birth year].  Stacy Frost is being seen in follow up after being seen in consultation for management of currently uncontrolled symptomatic diabetes requested by  Theoplis Fix, MD.   Past Medical History:  Diagnosis Date   Anxiety    Aortic stenosis    Mild to moderate   Bicuspid aortic valve    Essential hypertension, benign    GERD (gastroesophageal reflux disease)    Heart murmur    S/P Aortic Valve Replacement July 2024   History of cardiac catheterization    Normal coronaries 2008   Hyperlipidemia    Migraine headache    MVP (mitral valve prolapse)    Obesity    Pain in joint, ankle and foot    Chronic   Palpitations    Documented PACs by previous monitoring   Restless leg syndrome    Skin tag    Type 2 diabetes mellitus (HCC)    Unilateral primary osteoarthritis, right knee 09/06/2016    Past Surgical History:  Procedure Laterality Date   ACHILLES TENDON REPAIR  2003   Left   AORTIC VALVE REPLACEMENT N/A 05/14/2023   Procedure: AORTIC VALVE REPLACEMENT (AVR);  Surgeon: Hilarie Lovely, MD;  Location[facility] OR;  Service: Open Heart Surgery;  Laterality: N/A;   CARDIAC CATHETERIZATION  2008   Normal coronary arteries; normal LV systolic function; mild dilation of aortic root; mild dilation of proximal ascending aorta; likely bicuspid aortic valve   CHOLECYSTECTOMY  2001   Poor EF at 17%   COLONOSCOPY N/A 07/17/2014   Procedure: COLONOSCOPY;  Surgeon: Ruby Corporal, MD;  Location: [facility] ENDO SUITE;  Service: Endoscopy;  Laterality: N/A;  105   ESOPHAGEAL DILATION N/A 06/11/2015   Procedure: ESOPHAGEAL DILATION;  Surgeon: Ruby Corporal, MD;  Location: [facility] ORS;  Service: Endoscopy;  Laterality: N/ALonda Rival 54/56/58   ESOPHAGOGASTRODUODENOSCOPY N/A 07/17/2014   Procedure:  ESOPHAGOGASTRODUODENOSCOPY (EGD);  Surgeon: Ruby Corporal, MD;  Location: [facility] SUITE;  Service: Endoscopy;  Laterality: N/A;   ESOPHAGOGASTRODUODENOSCOPY (EGD) WITH PROPOFOL  N/A 06/11/2015   Procedure: ESOPHAGOGASTRODUODENOSCOPY (EGD) WITH PROPOFOL ;  Surgeon: Ruby Corporal, MD;  Location: [facility] ORS;  Service: Endoscopy;  Laterality: N/A;  730   KNEE ARTHROSCOPY WITH MEDIAL MENISECTOMY Right 07/02/2018   Procedure: RIGHT KNEE ARTHROSCOPY, DEBRIDEMENT, MEDIAL MENISECTOMY;  Surgeon: Shirlee Dotter, MD;  Location: [facility] OR;  Service: Orthopedics;  Laterality: Right;   KNEE SURGERY     MALONEY DILATION N/A 07/17/2014   Procedure: MALONEY DILATION;  Surgeon: Ruby Corporal, MD;  Location: [facility];  Service: Endoscopy;  Laterality: N/A;   RIGHT/LEFT HEART CATH AND CORONARY ANGIOGRAPHY N/A 05/02/2023   Procedure: RIGHT/LEFT HEART CATH AND CORONARY ANGIOGRAPHY;  Surgeon: [place], Peter M, MD;  Location: [facility] INVASIVE CV LAB;  Service: Cardiovascular;  Laterality: N/A;   TEE WITHOUT CARDIOVERSION N/A 05/14/2023   Procedure: TRANSESOPHAGEAL ECHOCARDIOGRAM;  Surgeon: Hilarie Lovely, MD;  Location: [facility] OR;  Service: Open Heart Surgery;  Laterality: N/A;   UMBILICAL HERNIA REPAIR  2003  Social History   Socioeconomic History   Marital status: Married    Spouse name: Mont Antis   Number of children: 2   Years of education: Not on file   Highest education level: Some college, no degree  Occupational History   Occupation: Arts development officer  Tobacco Use   Smoking status: Never   Smokeless tobacco: Never  Vaping Use   Vaping status: Never Used  Substance and Sexual Activity   Alcohol use: No    Alcohol/week: 0.0 standard drinks of alcohol   Drug use: No   Sexual activity: Yes  Other Topics Concern   Not on file  Social History Narrative   Married, lives with husband in a one level home. They recently got a puppy. She avoids caffeine, does not exercise.    Social Drivers of Manufacturing engineer Strain: Not on file  Food Insecurity: No Food Insecurity (02/01/2024)   Hunger Vital Sign    Worried About Running Out of Food in the Last Year: Never true    Ran Out of Food in the Last Year: Never true  Transportation Needs: No Transportation Needs (02/01/2024)   PRAPARE - Administrator, Civil Service (Medical): No    Lack of Transportation (Non-Medical): No  Physical Activity: Not on file  Stress: No Stress Concern Present (11/27/2019)   Received from [facility] - Occupational Stress Questionnaire    Feeling of Stress : Not at all  Social Connections: Not on file    Family History  Problem Relation Age of Onset   Depression Mother    Cancer Mother        Lung mets (unsure as to what kind)   Diabetes Mother    Hypertension Father    Depression Father    Heart attack Father    Depression Daughter    Anxiety disorder Daughter    Stroke Daughter    Transient ischemic attack Brother    Emphysema Maternal Grandfather    Brain cancer Paternal Grandmother    Heart attack Paternal Grandfather     Outpatient Encounter Medications as of 04/17/2024  Medication Sig   acetaminophen  (TYLENOL ) 325 MG tablet Take 2 tablets (650 mg total) by mouth every 4 (four) hours as needed for mild pain (pain score 1-3) (or temp > 100.5).   aspirin  EC 325 MG tablet Take 1 tablet (325 mg total) by mouth daily.   atorvastatin  (LIPITOR ) 80 MG tablet Take 1 tablet (80 mg total) by mouth daily.   citalopram  (CELEXA ) 40 MG tablet Take 1 tablet (40 mg total) by mouth daily.   clonazePAM  (KLONOPIN ) 0.5 MG tablet Take 1 tablet (0.5 mg total) by mouth 2 (two) times daily as needed for anxiety.   Continuous Blood Gluc Receiver (DEXCOM G7 RECEIVER) DEVI USE 1 receiver continuously   cyclobenzaprine  (FLEXERIL ) 10 MG tablet Take 1 tablet (10 mg total) by mouth 3 (three) times daily as needed for muscle spasms.   docusate sodium  (COLACE)  100 MG capsule Take 1 capsule (100 mg total) by mouth 2 (two) times daily.   ferrous sulfate  325 (65 FE) MG tablet Take 1 tablet (325 mg total) by mouth daily with breakfast.   furosemide  (LASIX ) 20 MG tablet Take 1 tablet (20 mg total) by mouth daily.   gabapentin  (NEURONTIN ) 100 MG capsule Take 1 capsule (100 mg total) by mouth daily AND 2 capsules (200 mg total) at bedtime.   metoprolol  succinate (  TOPROL -XL) 25 MG 24 hr tablet TAKE 1 TABLET BY MOUTH TWICE DAILY WITH OR IMMEDIATELY FOLLOWING A MEAL   oxyCODONE  (OXY IR/ROXICODONE ) 5 MG immediate release tablet Take 1 tablet (5 mg total) by mouth every 4 (four) hours as needed for moderate pain (pain score 4-6).   polyethylene glycol (MIRALAX  / GLYCOLAX ) 17 g packet Take 17 g by mouth daily.   potassium chloride  SA (KLOR-CON  M) 20 MEQ tablet Take 1 tablet (20 mEq total) by mouth daily.   pramipexole  (MIRAPEX ) 1 MG tablet Take 1 tablet (1 mg total) by mouth at bedtime.   traZODone  (DESYREL ) 150 MG tablet Take 1 tablet (150 mg total) by mouth at bedtime.   [DISCONTINUED] Continuous Blood Gluc Sensor (DEXCOM G7 SENSOR) MISC APPLY 1 SENSOR EVERY 10 DAYS   [DISCONTINUED] insulin  glargine-yfgn (SEMGLEE ) 100 UNIT/ML injection Inject 0.24 mLs (24 Units total) into the skin at bedtime.   [DISCONTINUED] metFORMIN  (GLUCOPHAGE -XR) 500 MG 24 hr tablet Take 2 tablets (1,000 mg total) by mouth daily with breakfast.   [DISCONTINUED] MOUNJARO  7.5 MG/0.5ML Pen INJECT 7.5 MG UNDER THE SKIN WEEKLY   Continuous Glucose Sensor (DEXCOM G7 SENSOR) MISC Change sensor every 10 days   insulin  glargine-yfgn (SEMGLEE ) 100 UNIT/ML injection Inject 0.45 mLs (45 Units total) into the skin at bedtime.   metFORMIN  (GLUCOPHAGE -XR) 500 MG 24 hr tablet Take 2 tablets (1,000 mg total) by mouth daily with breakfast.   tirzepatide  (MOUNJARO ) 7.5 MG/0.5ML Pen Inject 7.5 mg into the skin once a week.   No facility-administered encounter medications on file as of 04/17/2024.     ALLERGIES: Allergies  Allergen Reactions   Amlodipine Besylate Shortness Of Breath   Morphine And Codeine  Shortness Of Breath and Other (See Comments)    Chest pain   Amitriptyline Other (See Comments)    Unknown reaction   Butorphanol Tartrate Other (See Comments)    REACTION: ED visit and got for migraine - not sure of response   Compazine [Prochlorperazine Edisylate] Other (See Comments)    Altered mental status   Rocephin [Ceftriaxone Sodium In Dextrose ]     Given at GYN for infection - nauseated, dizziness (w/in last 7-8 years)   Sumatriptan Other (See Comments)    REACTION: HTN    VACCINATION STATUS: Immunization History  Administered Date(s) Administered   H1N1 08/20/2008   Influenza Whole 08/20/2008   PNEUMOCOCCAL CONJUGATE-20 01/31/2024   Pneumococcal Polysaccharide-23 08/20/2008   Td 05/30/1996    Diabetes She presents for her follow-up diabetic visit. She has type 2 diabetes mellitus. Onset time: Diagnosed at approx age of 30. Her disease course has been worsening. There are no hypoglycemic associated symptoms. Pertinent negatives for diabetes include no weight loss. There are no hypoglycemic complications. Symptoms are stable. There are no diabetic complications. Risk factors for coronary artery disease include diabetes mellitus, dyslipidemia, family history, obesity, hypertension, sedentary lifestyle and post-menopausal. Current diabetic treatment includes oral agent (monotherapy) and insulin  injections (and Mounjaro ). She is compliant with treatment most of the time. Her weight is fluctuating minimally. She is following a generally healthy diet. When asked about meal planning, she reported none. She has not had a previous visit with a dietitian. She rarely participates in exercise. Her home blood glucose trend is increasing steadily. Her breakfast blood glucose range is generally >200 mg/dl. Her lunch blood glucose range is generally >200 mg/dl. Her dinner blood  glucose range is generally >200 mg/dl. Her bedtime blood glucose range is generally >200 mg/dl. Her overall blood glucose range  is >200 mg/dl. (She presents today with her CGM showing gross hyperglycemic overall.  Her POCT A1c today is 9.5%, improving from last visit of 9.7%.  Analysis of her CGM shows TIR 0%, TAR 100%, TBR 0% with a GMI of 11.4%.  She did just have back surgery, spent 2 weeks in CIR.  She was not on her usual DM meds while admitted.  She has since started back her medications, not had any side effects from restarting Mounjaro .) An ACE inhibitor/angiotensin II receptor blocker is being taken. She does not see a podiatrist.Eye exam is current.    Review of systems  Constitutional: + increasing body weight,  current Body mass index is 31.57 kg/m. , no fatigue, no subjective hyperthermia, no subjective hypothermia Eyes: no blurry vision, no xerophthalmia ENT: no sore throat, no nodules palpated in throat, no dysphagia/odynophagia, no hoarseness Cardiovascular: no chest pain, no shortness of breath, no palpitations, no leg swelling Respiratory: no cough, no shortness of breath Gastrointestinal: no nausea/vomiting/diarrhea, + intermittent constipation Musculoskeletal: + back pain and leg weakness- recent back surgery- ambulating with walker Skin: no rashes, no hyperemia Neurological: no tremors, no numbness, no tingling, no dizziness Psychiatric: no depression, no anxiety  Objective:     BP 118/72 (BP Location: Right Arm, Patient Position: Sitting, Cuff Size: Large)   Pulse 74   Ht 5' 6 (1.676 m)   Wt 195 lb 9.6 oz (88.7 kg)   BMI 31.57 kg/m   Wt Readings from Last 3 Encounters:  04/17/24 195 lb 9.6 oz (88.7 kg)  02/09/24 186 lb 11.7 oz (84.7 kg)  01/28/24 192 lb (87.1 kg)     BP Readings from Last 3 Encounters:  04/17/24 118/72  02/13/24 120/77  02/01/24 136/70      Physical Exam- Limited  Constitutional:  Body mass index is 31.57 kg/m. , not in acute distress,  normal state of mind Eyes:  EOMI, no exophthalmos Musculoskeletal: walking with walker due to recent back surgery and leg weakness Skin:  no rashes, no hyperemia Neurological: no tremor with outstretched hands   Diabetic Foot Exam - Simple   No data filed     CMP ( most recent) CMP     Component Value Date/Time   NA 138 02/13/2024 0529   K 4.1 02/13/2024 0529   CL 98 02/13/2024 0529   CO2 27 02/13/2024 0529   GLUCOSE 196 (H) 02/13/2024 0529   BUN 10 02/13/2024 0529   CREATININE 0.85 02/13/2024 0529   CALCIUM  9.0 02/13/2024 0529   PROT 6.7 02/04/2024 0941   ALBUMIN  2.7 (L) 02/04/2024 0941   AST 47 (H) 02/04/2024 0941   ALT 44 02/04/2024 0941   ALKPHOS 60 02/04/2024 0941   BILITOT 1.0 02/04/2024 0941   GFRNONAA >60 02/13/2024 0529   GFRAA >60 06/27/2018 0929     Diabetic Labs (most recent): Lab Results  Component Value Date   HGBA1C 9.5 (A) 04/17/2024   HGBA1C 9.7 (A) 10/03/2023   HGBA1C 7.4 (H) 05/14/2023   MICROALBUR 30 mg/L 08/29/2022     Lipid Panel ( most recent) Lipid Panel     Component Value Date/Time   CHOL 165 06/29/2008 0842   TRIG 116 12/03/2023 1006   HDL 50 12/03/2023 1006   LDLCALC 82 06/29/2008 0842      No results found for: TSH, FREET4         Assessment & Plan:   1) Type 2 diabetes mellitus with hyperglycemia, with long-term current use of insulin  (HCC)  She presents today with her CGM showing gross hyperglycemic overall.  Her POCT A1c today is 9.5%, improving from last visit of 9.7%.  Analysis of her CGM shows TIR 0%, TAR 100%, TBR 0% with a GMI of 11.4%.  She did just have back surgery, spent 2 weeks in CIR.  She was not on her usual DM meds while admitted.  She has since started back her medications, not had any side effects from restarting Mounjaro .  - Hajar Penninger has currently uncontrolled symptomatic type 2 DM since 63 years of age.   -Recent labs reviewed.  - I had a long discussion with her about the progressive nature  of diabetes and the pathology behind its complications. -her diabetes is not currently complicated but she remains at a high risk for more acute and chronic complications which include CAD, CVA, CKD, retinopathy, and neuropathy. These are all discussed in detail with her.  The following Lifestyle Medicine recommendations according to American College of Lifestyle Medicine [facility]) were discussed and offered to patient and she agrees to start the journey:  A. Whole Foods, Plant-based plate comprising of fruits and vegetables, plant-based proteins, whole-grain carbohydrates was discussed in detail with the patient.   A list for source of those nutrients were also provided to the patient.  Patient will use only water  or unsweetened tea for hydration. B.  The need to stay away from risky substances including alcohol, smoking; obtaining 7 to 9 hours of restorative sleep, at least 150 minutes of moderate intensity exercise weekly, the importance of healthy social connections,  and stress reduction techniques were discussed. C.  A full color page of  Calorie density of various food groups per pound showing examples of each food groups was provided to the patient.  - Nutritional counseling repeated at each appointment due to patients tendency to fall back in to old habits.  - The patient admits there is a room for improvement in their diet and drink choices. -  Suggestion is made for the patient to avoid simple carbohydrates from their diet including Cakes, Sweet Desserts / Pastries, Ice Cream, Soda (diet and regular), Sweet Tea, Candies, Chips, Cookies, Sweet Pastries, Store Bought Juices, Alcohol in Excess of 1-2 drinks a day, Artificial Sweeteners, Coffee Creamer, and Sugar-free Products. This will help patient to have stable blood glucose profile and potentially avoid unintended weight gain.   - I encouraged the patient to switch to unprocessed or minimally processed complex starch and increased protein  intake (animal or plant source), fruits, and vegetables.   - Patient is advised to stick to a routine mealtimes to eat 3 meals a day and avoid unnecessary snacks (to snack only to correct hypoglycemia).  - I have approached her with the following individualized plan to manage her diabetes and patient agrees:   -She is advised to continue Mounjaro  7.5 mg SQ weekly to and continue her Metformin  1000 mg ER PO daily with breakfast.  Will Semglee  back to 45 units SQ nightly.  Will try to keep her off prandial insulin  for now.  -she is encouraged to continue monitoring glucose 4 times daily (using her CGM), before meals and before bed, and to call the clinic if she has readings less than 70 or above 200 for 3 tests in a row.   - Adjustment parameters are given to her for hypo and hyperglycemia in writing.  - her Elvina Hammers was previously discontinued, due to polyuria and increased risk of UTI and yeast infections.   -  Specific targets for  A1c; LDL, HDL, and Triglycerides were discussed with the patient.  2) Blood Pressure /Hypertension:  her blood pressure is controlled to target.   she is advised to continue her current medications including Metoprolol  200 mg p.o. daily with breakfast, Lisinopril 20 mg po daily, Lasix  40 mg po daily.  3) Lipids/Hyperlipidemia:    Recent lipid panel from 09/25/23 shows controlled LDL of 45.  she is advised to continue Simvastatin  40 mg daily at bedtime.  Side effects and precautions discussed with her.    4)  Weight/Diet:  her Body mass index is 31.57 kg/m.  -  clearly complicating her diabetes care.   she is a candidate for weight loss. I discussed with her the fact that loss of 5 - 10% of her  current body weight will have the most impact on her diabetes management.  Exercise, and detailed carbohydrates information provided  -  detailed on discharge instructions.  5) Chronic Care/Health Maintenance: -she is on ACEI/ARB and Statin medications and is encouraged to  initiate and continue to follow up with Ophthalmology, Dentist, Podiatrist at least yearly or according to recommendations, and advised to stay away from smoking. I have recommended yearly flu vaccine and pneumonia vaccine at least every 5 years; moderate intensity exercise for up to 150 minutes weekly; and sleep for at least 7 hours a day.  - she is advised to maintain close follow up with Theoplis Fix, MD for primary care needs, as well as her other providers for optimal and coordinated care.     I spent  44  minutes in the care of the patient today including review of labs from CMP, Lipids, Thyroid Function, Hematology (current and previous including abstractions from other facilities); face-to-face time discussing  her blood glucose readings/logs, discussing hypoglycemia and hyperglycemia episodes and symptoms, medications doses, her options of short and long term treatment based on the latest standards of care / guidelines;  discussion about incorporating lifestyle medicine;  and documenting the encounter. Risk reduction counseling performed per USPSTF guidelines to reduce obesity and cardiovascular risk factors.     Please refer to Patient Instructions for Blood Glucose Monitoring and Insulin /Medications Dosing Guide  in media tab for additional information. Please  also refer to  Patient Self Inventory in the Media  tab for reviewed elements of pertinent patient history.  Paticia Boning participated in the discussions, expressed understanding, and voiced agreement with the above plans.  All questions were answered to her satisfaction. she is encouraged to contact clinic should she have any questions or concerns prior to her return visit.     Follow up plan: - Return in about 3 months (around 07/18/2024) for Diabetes F/U with A1c in office, No previsit labs, Bring meter and logs.  Hulon Magic, [facility] [address] [place], [place] [address] Phone:  [phone] Fax: [phone]  04/17/2024, 12:00 PM

## 2024-04-21 ENCOUNTER — Other Ambulatory Visit: Payer: Self-pay

## 2024-04-22 ENCOUNTER — Telehealth: Payer: Self-pay | Admitting: Internal Medicine

## 2024-04-22 DIAGNOSIS — Z79899 Other long term (current) drug therapy: Secondary | ICD-10-CM

## 2024-04-22 NOTE — Telephone Encounter (Signed)
 Spoke with pt and she stated that she has bad swelling in her legs, ankles, and feet. Asked pt if she was having any shortness of breath, pt stated some while up and moving around. Pt denied any other symptoms. Pt states she has not been weighing herself so she is unsure if she has had any weight gain. Reviewed this information with DOD (Dr. Alveta). Dr. Alveta recommended doubling Lasix  dose to a total of 40 mg once daily for 3 days and limiting salt intake. Pt advised that if swelling and/ or shortness of breath gets worse, pt should go to ED to be evaluated. Pt asked to call us  after the 3 days is up to let us  know how her symptoms are. Pt verbalized understanding of plan and had no questions at this time.

## 2024-04-22 NOTE — Telephone Encounter (Signed)
 Pt c/o swelling: STAT is pt has developed SOB within 24 hours  How much weight have you gained and in what time span? No  If swelling, where is the swelling located? Legs, feet and ankles  Are you currently taking a fluid pill? Yes  Are you currently SOB? Yes, just a little   Do you have a log of your daily weights (if so, list)? No  Have you gained 3 pounds in a day or 5 pounds in a week? Unsure  Have you traveled recently? No

## 2024-04-22 NOTE — Telephone Encounter (Signed)
 Would set up for BMET and BNP and CBC and TSH after finishing lasix 

## 2024-04-23 NOTE — Telephone Encounter (Signed)
 Spoke with patient and shared recommendation from Dr. Okey:  Would set up for BMET and BNP and CBC and TSH after finishing lasix     Labs ordered and released to [facility]. Patient states she will have labs drawn at [facility] in [place] on Friday 04/25/24.  She reports her swelling and SOB has improved some since yesterday with extra dose of Lasix .

## 2024-04-23 NOTE — Addendum Note (Signed)
 Addended by: VICCI ROXIE CROME on: 04/23/2024 01:07 PM   Modules accepted: Orders

## 2024-04-28 NOTE — Progress Notes (Unsigned)
 Cardiology Office Note   Date:  04/30/2024   ID:  Stacy Frost, DOB 1961/10/13, MRN [id number]  PCP:  Maree Isles, MD  Cardiologist:   Vina Gull, MD   Pt presents for follow up of AS     History of Present Illness: Stacy Frost is a 63 y.o. female with a history of  bicuspid AV and AS (s/p AVR 2024), T2DM, migraine HA and DJD  July 2024  PT with severe, symptomatic AS   Underwent R/L HC   This showed no CAD. PCWP mean 13  PAP 31/13  Cardiac index 2.08 May 2023  Pt underwent AVR with 25 mm Edwards Inspiris Bovine pericardial tissue valve    Last echo in November 2024 valve functioning well  LVEF normal  I saw the pt in Feb 2025 prior to back surgery     She had surgery after on lumbar spine   Spent 2 wks in inpt rehab and is now in outpt PT    She still has signficant back pain   Mobility is really limited   Uses a rollator   Legs give out intermitt After surgery she was on 3 courses of ABX    She says scab on lower part of incision just came off   Drains a little green fluid      The pt deneis CP   No SOB   Has occasional dizziness with standing   No fevers  A1C is over 9 despite no change in diet   A1 C had been in 6s before    Pt says she doesn't eat much  Current Meds  Medication Sig   acetaminophen  (TYLENOL ) 325 MG tablet Take 2 tablets (650 mg total) by mouth every 4 (four) hours as needed for mild pain (pain score 1-3) (or temp > 100.5).   aspirin  EC 325 MG tablet Take 1 tablet (325 mg total) by mouth daily.   atorvastatin  (LIPITOR ) 80 MG tablet Take 1 tablet (80 mg total) by mouth daily.   citalopram  (CELEXA ) 40 MG tablet Take 1 tablet (40 mg total) by mouth daily.   clonazePAM  (KLONOPIN ) 0.5 MG tablet Take 1 tablet (0.5 mg total) by mouth 2 (two) times daily as needed for anxiety.   Continuous Blood Gluc Receiver (DEXCOM G7 RECEIVER) DEVI USE 1 receiver continuously   Continuous Glucose Sensor (DEXCOM G7 SENSOR) MISC Change sensor every 10 days   cyclobenzaprine  (FLEXERIL )  10 MG tablet Take 1 tablet (10 mg total) by mouth 3 (three) times daily as needed for muscle spasms.   docusate sodium  (COLACE) 100 MG capsule Take 1 capsule (100 mg total) by mouth 2 (two) times daily.   ferrous sulfate  325 (65 FE) MG tablet Take 1 tablet (325 mg total) by mouth daily with breakfast.   furosemide  (LASIX ) 20 MG tablet Take 1 tablet (20 mg total) by mouth daily.   gabapentin  (NEURONTIN ) 100 MG capsule Take 1 capsule (100 mg total) by mouth daily AND 2 capsules (200 mg total) at bedtime.   insulin  glargine-yfgn (SEMGLEE ) 100 UNIT/ML injection Inject 0.45 mLs (45 Units total) into the skin at bedtime.   metFORMIN  (GLUCOPHAGE -XR) 500 MG 24 hr tablet Take 2 tablets (1,000 mg total) by mouth daily with breakfast.   metoprolol  succinate (TOPROL -XL) 25 MG 24 hr tablet TAKE 1 TABLET BY MOUTH TWICE DAILY WITH OR IMMEDIATELY FOLLOWING A MEAL   oxyCODONE  (OXY IR/ROXICODONE ) 5 MG immediate release tablet Take 1 tablet (5 mg total) by mouth every 4 (  four) hours as needed for moderate pain (pain score 4-6).   polyethylene glycol (MIRALAX  / GLYCOLAX ) 17 g packet Take 17 g by mouth daily.   potassium chloride  SA (KLOR-CON  M) 20 MEQ tablet Take 1 tablet (20 mEq total) by mouth daily.   pramipexole  (MIRAPEX ) 1 MG tablet Take 1 tablet (1 mg total) by mouth at bedtime.   tirzepatide  (MOUNJARO ) 7.5 MG/0.5ML Pen Inject 7.5 mg into the skin once a week.   traZODone  (DESYREL ) 150 MG tablet Take 1 tablet (150 mg total) by mouth at bedtime.     Allergies:   Amlodipine besylate, Morphine and codeine , Amitriptyline, Butorphanol tartrate, Compazine [prochlorperazine edisylate], Rocephin [ceftriaxone sodium in dextrose ], and Sumatriptan   Past Medical History:  Diagnosis Date   Anxiety    Aortic stenosis    Mild to moderate   Bicuspid aortic valve    Essential hypertension, benign    GERD (gastroesophageal reflux disease)    Heart murmur    S/P Aortic Valve Replacement July 2024   History of cardiac  catheterization    Normal coronaries 2008   Hyperlipidemia    Migraine headache    MVP (mitral valve prolapse)    Obesity    Pain in joint, ankle and foot    Chronic   Palpitations    Documented PACs by previous monitoring   Restless leg syndrome    Skin tag    Type 2 diabetes mellitus (HCC)    Unilateral primary osteoarthritis, right knee 09/06/2016    Past Surgical History:  Procedure Laterality Date   ACHILLES TENDON REPAIR  2003   Left   AORTIC VALVE REPLACEMENT N/A 05/14/2023   Procedure: AORTIC VALVE REPLACEMENT (AVR);  Surgeon: Shyrl Linnie KIDD, MD;  Location: [facility] OR;  Service: Open Heart Surgery;  Laterality: N/A;   BACK SURGERY  01/28/2024   CARDIAC CATHETERIZATION  2008   Normal coronary arteries; normal LV systolic function; mild dilation of aortic root; mild dilation of proximal ascending aorta; likely bicuspid aortic valve   CHOLECYSTECTOMY  2001   Poor EF at 17%   COLONOSCOPY N/A 07/17/2014   Procedure: COLONOSCOPY;  Surgeon: Claudis RAYMOND Rivet, MD;  Location: [facility] SUITE;  Service: Endoscopy;  Laterality: N/A;  105   ESOPHAGEAL DILATION N/A 06/11/2015   Procedure: ESOPHAGEAL DILATION;  Surgeon: Claudis RAYMOND Rivet, MD;  Location: [facility] ORS;  Service: Endoscopy;  Laterality: N/AMERL Stai 54/56/58   ESOPHAGOGASTRODUODENOSCOPY N/A 07/17/2014   Procedure: ESOPHAGOGASTRODUODENOSCOPY (EGD);  Surgeon: Claudis RAYMOND Rivet, MD;  Location: [facility];  Service: Endoscopy;  Laterality: N/A;   ESOPHAGOGASTRODUODENOSCOPY (EGD) WITH PROPOFOL  N/A 06/11/2015   Procedure: ESOPHAGOGASTRODUODENOSCOPY (EGD) WITH PROPOFOL ;  Surgeon: Claudis RAYMOND Rivet, MD;  Location: [facility] OR[facility];  Service: Endoscopy;  Laterality: N/A;  730   KNEE ARTHROSCOPY WITH MEDIAL MENISECTOMY Right 07/02/2018   Procedure: RIGHT KNEE ARTHROSCOPY, DEBRIDEMENT, MEDIAL MENISECTOMY;  Surgeon: Anderson Maude ORN, MD;  Location: [facility] OR;  Service: Orthopedics;  Laterality: Right;   KNEE SURGERY     MALONEY DILATION N/A 07/17/2014    Procedure: MALONEY DILATION;  Surgeon: Claudis RAYMOND Rivet, MD;  Location: [facility];  Service: Endoscopy;  Laterality: N/A;   RIGHT/LEFT HEART CATH AND CORONARY ANGIOGRAPHY N/A 05/02/2023   Procedure: RIGHT/LEFT HEART CATH AND CORONARY ANGIOGRAPHY;  Surgeon: [place], Peter M, MD;  Location: [facility] INVASIVE CV LAB;  Service: Cardiovascular;  Laterality: N/A;   TEE WITHOUT CARDIOVERSION N/A 05/14/2023   Procedure: TRANSESOPHAGEAL ECHOCARDIOGRAM;  Surgeon: Shyrl Linnie KIDD, MD;  Location:  [facility] OR;  Service: Open Heart Surgery;  Laterality: N/A;   UMBILICAL HERNIA REPAIR  2003     Social History:  The patient  reports that she has never smoked. She has never used smokeless tobacco. She reports that she does not drink alcohol and does not use drugs.   Family History:  The patient's family history includes Anxiety disorder in her daughter; Brain cancer in her paternal grandmother; Cancer in her mother; Depression in her daughter, father, and mother; Diabetes in her mother; Emphysema in her maternal grandfather; Heart attack in her father and paternal grandfather; Hypertension in her father; Stroke in her daughter; Transient ischemic attack in her brother.    ROS:  Please see the history of present illness. All other systems are reviewed and  Negative to the above problem except as noted.    PHYSICAL EXAM: VS:  BP 130/76   Pulse 82   Ht 5' 6 (1.676 m)   Wt 195 lb 12.8 oz (88.8 kg)   SpO2 96%   BMI 31.60 kg/m   GEN: Obese 63 yo in no acute distress  HEENT: normal  Neck: JVP is normal    Cardiac: RRR; No murmurs   Tr LE edema  Respiratory:  clear to auscultation GI: soft, nontender, No hepatomegaly  Back   INcison in lower back   Tissue still healing on lower aspect   Did not press     EKG:  EKG shows NSR    Septal MI   Echo   Nov 2024  1. Left ventricular ejection fraction, by estimation, is 65 to 70%. The  left ventricle has normal function. The left ventricle has no regional  wall  motion abnormalities. There is mild left ventricular hypertrophy.   2. Right ventricular systolic function is normal. The right ventricular  size is normal.   3. 25mm Inspiris Valve is in the AV position with normal function. . The  aortic valve has been repaired/replaced. Aortic valve regurgitation is not  visualized. No aortic stenosis is present.   4. IVC is small suggesting low RA pressure and hypovolemia.   5. Limited echo evaluate aortic valve function   Lipid Panel    Component Value Date/Time   CHOL 165 06/29/2008 0842   TRIG 116 12/03/2023 1006   HDL 50 12/03/2023 1006   LDLCALC 82 06/29/2008 0842      Wt Readings from Last 3 Encounters:  04/30/24 195 lb 12.8 oz (88.8 kg)  04/17/24 195 lb 9.6 oz (88.7 kg)  02/09/24 186 lb 11.7 oz (84.7 kg)      ASSESSMENT AND PLAN:  1  Aortic stenosis/   Pt s/p AVR in July 2024 for critical AS   Echo in Nov 2024 shows normal LV function   AV prosthesis functioning well No murmurs on exam     Follow     2  HTN  BP is wll controlled  Continue current regimen    3  HL  LIpids well controlled when last checked   4  T2DM   Last A1C 9.5  despite diet being the same diet.  MEds adjusted  Followed in endocrine  5  DJD   s/p surgery lumbar spine    Was on 3 courses of ABX     Concerning that A1C is high   ALso that has some greenish drainage Will get CBC and ESR     Follow up in 6 months   Current medicines are reviewed at length with  the patient today.  The patient does not have concerns regarding medicines.  Signed, Vina Gull, MD  04/30/2024 8:41 AM    [facility] [address] [place], [place], [place]  [address] Phone: [phone]; Fax: [phone]

## 2024-04-29 ENCOUNTER — Encounter: Payer: Self-pay | Admitting: Nurse Practitioner

## 2024-04-30 ENCOUNTER — Encounter: Payer: Self-pay | Admitting: Internal Medicine

## 2024-04-30 ENCOUNTER — Ambulatory Visit: Attending: Internal Medicine | Admitting: Internal Medicine

## 2024-04-30 ENCOUNTER — Other Ambulatory Visit (HOSPITAL_COMMUNITY)
Admission: RE | Admit: 2024-04-30 | Discharge: 2024-04-30 | Disposition: A | Discharge status: Home / Self Care | Source: Ambulatory Visit | Attending: Internal Medicine | Admitting: Internal Medicine

## 2024-04-30 VITALS — BP 130/76 | HR 82 | Ht 66.0 in | Wt 195.8 lb

## 2024-04-30 DIAGNOSIS — I5032 Chronic diastolic (congestive) heart failure: Secondary | ICD-10-CM | POA: Diagnosis not present

## 2024-04-30 DIAGNOSIS — Z952 Presence of prosthetic heart valve: Secondary | ICD-10-CM | POA: Diagnosis not present

## 2024-04-30 LAB — CBC
HCT: 38.5 % (ref 36.0–46.0)
Hemoglobin: 12.7 g/dL (ref 12.0–15.0)
MCH: 27.1 pg (ref 26.0–34.0)
MCHC: 33 g/dL (ref 30.0–36.0)
MCV: 82.1 fL (ref 80.0–100.0)
Platelets: 280 10*3/uL (ref 150–400)
RBC: 4.69 MIL/uL (ref 3.87–5.11)
RDW: 13.8 % (ref 11.5–15.5)
WBC: 7.6 10*3/uL (ref 4.0–10.5)
nRBC: 0 % (ref 0.0–0.2)

## 2024-04-30 LAB — COMPREHENSIVE METABOLIC PANEL WITH GFR
ALT: 28 U/L (ref 0–44)
AST: 25 U/L (ref 15–41)
Albumin: 3.7 g/dL (ref 3.5–5.0)
Alkaline Phosphatase: 128 U/L — ABNORMAL HIGH (ref 38–126)
Anion gap: 13 (ref 5–15)
BUN: 15 mg/dL (ref 8–23)
CO2: 27 mmol/L (ref 22–32)
Calcium: 9.1 mg/dL (ref 8.9–10.3)
Chloride: 98 mmol/L (ref 98–111)
Creatinine, Ser: 0.81 mg/dL (ref 0.44–1.00)
GFR, Estimated: >60 mL/min (ref >60)
Glucose, Bld: 200 mg/dL — ABNORMAL HIGH (ref 70–99)
Potassium: 4.4 mmol/L (ref 3.5–5.1)
Sodium: 138 mmol/L (ref 135–145)
Total Bilirubin: 0.8 mg/dL (ref 0.0–1.2)
Total Protein: 7.4 g/dL (ref 6.5–8.1)

## 2024-04-30 LAB — SEDIMENTATION RATE: Sed Rate: 20 mm/h (ref 0–30)

## 2024-04-30 LAB — BRAIN NATRIURETIC PEPTIDE: B Natriuretic Peptide: 82 pg/mL (ref 0.0–100.0)

## 2024-04-30 NOTE — Patient Instructions (Signed)
 Medication Instructions:  Your physician recommends that you continue on your current medications as directed. Please refer to the Current Medication list given to you today.   Labwork: TODAY, cbc,cmet,bnp, sed rate  Testing/Procedures: None today  Follow-Up: 6 months  Any Other Special Instructions Will Be Listed Below (If Applicable).  If you need a refill on your cardiac medications before your next appointment, please call your pharmacy.

## 2024-05-02 ENCOUNTER — Ambulatory Visit: Payer: Self-pay | Admitting: Internal Medicine

## 2024-05-20 ENCOUNTER — Other Ambulatory Visit: Payer: Self-pay

## 2024-05-27 ENCOUNTER — Other Ambulatory Visit: Payer: Self-pay | Admitting: Nurse Practitioner

## 2024-05-30 ENCOUNTER — Other Ambulatory Visit: Payer: Self-pay

## 2024-05-30 MED ORDER — POTASSIUM CHLORIDE CRYS ER 20 MEQ PO TBCR: 20.0000 meq | EXTENDED_RELEASE_TABLET | Freq: Every day | ORAL | 3 refills | Status: AC

## 2024-07-22 ENCOUNTER — Ambulatory Visit (INDEPENDENT_AMBULATORY_CARE_PROVIDER_SITE_OTHER): Admitting: Nurse Practitioner

## 2024-07-22 ENCOUNTER — Encounter: Payer: Self-pay | Admitting: Nurse Practitioner

## 2024-07-22 VITALS — BP 138/82 | HR 90 | Ht 66.0 in | Wt 192.2 lb

## 2024-07-22 DIAGNOSIS — E1165 Type 2 diabetes mellitus with hyperglycemia: Secondary | ICD-10-CM

## 2024-07-22 DIAGNOSIS — Z7984 Long term (current) use of oral hypoglycemic drugs: Secondary | ICD-10-CM | POA: Diagnosis not present

## 2024-07-22 DIAGNOSIS — I10 Essential (primary) hypertension: Secondary | ICD-10-CM | POA: Diagnosis not present

## 2024-07-22 DIAGNOSIS — Z7985 Long-term (current) use of injectable non-insulin antidiabetic drugs: Secondary | ICD-10-CM | POA: Diagnosis not present

## 2024-07-22 DIAGNOSIS — Z794 Long term (current) use of insulin: Secondary | ICD-10-CM

## 2024-07-22 LAB — POCT GLYCOSYLATED HEMOGLOBIN (HGB A1C): Hemoglobin A1C: 7.6 % — AB (ref 4.0–5.6)

## 2024-07-22 MED ORDER — MOUNJARO 7.5 MG/0.5ML ~~LOC~~ SOAJ: 7.5000 mg | SUBCUTANEOUS | 1 refills | Status: DC

## 2024-07-22 MED ORDER — METFORMIN HCL ER 500 MG PO TB24
1000.0000 mg | ORAL_TABLET | Freq: Every day | ORAL | 3 refills | Status: AC
Start: 2024-07-22 — End: ?

## 2024-07-22 MED ORDER — INSULIN GLARGINE-YFGN 100 UNIT/ML ~~LOC~~ SOLN: 60.0000 [IU] | Freq: Every day | SUBCUTANEOUS | 3 refills | Status: DC

## 2024-07-22 NOTE — Progress Notes (Signed)
 Endocrinology Follow Up Note       07/22/2024, 9:09 AM   Subjective:    Patient ID: Stacy Frost, female    DOB: 11/09/1960.  Stacy Frost is being seen in follow up after being seen in consultation for management of currently uncontrolled symptomatic diabetes requested by  Maree Isles, MD.   Past Medical History:  Diagnosis Date   Anxiety    Aortic stenosis    Mild to moderate   Bicuspid aortic valve    Essential hypertension, benign    GERD (gastroesophageal reflux disease)    Heart murmur    S/P Aortic Valve Replacement July 2024   History of cardiac catheterization    Normal coronaries 2008   Hyperlipidemia    Migraine headache    MVP (mitral valve prolapse)    Obesity    Pain in joint, ankle and foot    Chronic   Palpitations    Documented PACs by previous monitoring   Restless leg syndrome    Skin tag    Type 2 diabetes mellitus (HCC)    Unilateral primary osteoarthritis, right knee 09/06/2016    Past Surgical History:  Procedure Laterality Date   ACHILLES TENDON REPAIR  2003   Left   AORTIC VALVE REPLACEMENT N/A 05/14/2023   Procedure: AORTIC VALVE REPLACEMENT (AVR);  Surgeon: Shyrl Linnie KIDD, MD;  Location: [facility] OR;  Service: Open Heart Surgery;  Laterality: N/A;   BACK SURGERY  01/28/2024   CARDIAC CATHETERIZATION  2008   Normal coronary arteries; normal LV systolic function; mild dilation of aortic root; mild dilation of proximal ascending aorta; likely bicuspid aortic valve   CHOLECYSTECTOMY  2001   Poor EF at 17%   COLONOSCOPY N/A 07/17/2014   Procedure: COLONOSCOPY;  Surgeon: Claudis RAYMOND Rivet, MD;  Location: [facility] END[facility] SUITE;  Service: Endoscopy;  Laterality: N/A;  105   ESOPHAGEAL DILATION N/A 06/11/2015   Procedure: ESOPHAGEAL DILATION;  Surgeon: Claudis RAYMOND Rivet, MD;  Location: [facility] ORS;  Service: Endoscopy;  Laterality: N/AMERL Stai 54/56/58   ESOPHAGOGASTRODUODENOSCOPY N/A  07/17/2014   Procedure: ESOPHAGOGASTRODUODENOSCOPY (EGD);  Surgeon: Claudis RAYMOND Rivet, MD;  Location: [facility];  Service: Endoscopy;  Laterality: N/A;   ESOPHAGOGASTRODUODENOSCOPY (EGD) WITH PROPOFOL  N/A 06/11/2015   Procedure: ESOPHAGOGASTRODUODENOSCOPY (EGD) WITH PROPOFOL ;  Surgeon: Claudis RAYMOND Rivet, MD;  Location: [facility] ORS;  Service: Endoscopy;  Laterality: N/A;  730   KNEE ARTHROSCOPY WITH MEDIAL MENISECTOMY Right 07/02/2018   Procedure: RIGHT KNEE ARTHROSCOPY, DEBRIDEMENT, MEDIAL MENISECTOMY;  Surgeon: Anderson Maude ORN, MD;  Location: [facility] OR;  Service: Orthopedics;  Laterality: Right;   KNEE SURGERY     MALONEY DILATION N/A 07/17/2014   Procedure: MALONEY DILATION;  Surgeon: Claudis RAYMOND Rivet, MD;  Location: [facility];  Service: Endoscopy;  Laterality: N/A;   RIGHT/LEFT HEART CATH AND CORONARY ANGIOGRAPHY N/A 05/02/2023   Procedure: RIGHT/LEFT HEART CATH AND CORONARY ANGIOGRAPHY;  Surgeon: Swazil[place], Peter M, MD;  Location: [facility] INVASIVE CV LAB;  Service: Cardiovascular;  Laterality: N/A;   TEE WITHOUT CARDIOVERSION N/A 05/14/2023   Procedure: TRANSESOPHAGEAL ECHOCARDIOGRAM;  Surgeon: Shyrl Linnie KIDD, MD;  Location: [facility] OR;  Service: Open Heart Surgery;  Laterality: N/A;  UMBILICAL HERNIA REPAIR  2003    Social History   Socioeconomic History   Marital status: Married    Spouse name: Darina   Number of children: 2   Years of education: Not on file   Highest education level: Some college, no degree  Occupational History   Occupation: Arts development officer  Tobacco Use   Smoking status: Never   Smokeless tobacco: Never  Vaping Use   Vaping status: Never Used  Substance and Sexual Activity   Alcohol use: No    Alcohol/week: 0.0 standard drinks of alcohol   Drug use: No   Sexual activity: Yes  Other Topics Concern   Not on file  Social History Narrative   Married, lives with husband in a one level home. They recently got a puppy. She avoids caffeine, does not exercise.    Social  Drivers of Corporate investment banker Strain: Not on file  Food Insecurity: No Food Insecurity (02/01/2024)   Hunger Vital Sign    Worried About Running Out of Food in the Last Year: Never true    Ran Out of Food in the Last Year: Never true  Transportation Needs: No Transportation Needs (02/01/2024)   PRAPARE - Administrator, Civil Service (Medical): No    Lack of Transportation (Non-Medical): No  Physical Activity: Not on file  Stress: No Stress Concern Present (11/27/2019)   Received from [facility] of Occupational Health - Occupational Stress Questionnaire    Feeling of Stress : Not at all  Social Connections: Not on file    Family History  Problem Relation Age of Onset   Depression Mother    Cancer Mother        Lung mets (unsure as to what kind)   Diabetes Mother    Hypertension Father    Depression Father    Heart attack Father    Depression Daughter    Anxiety disorder Daughter    Stroke Daughter    Transient ischemic attack Brother    Emphysema Maternal Grandfather    Brain cancer Paternal Grandmother    Heart attack Paternal Grandfather     Outpatient Encounter Medications as of 07/22/2024  Medication Sig   acetaminophen  (TYLENOL ) 325 MG tablet Take 2 tablets (650 mg total) by mouth every 4 (four) hours as needed for mild pain (pain score 1-3) (or temp > 100.5).   aspirin  EC 325 MG tablet Take 1 tablet (325 mg total) by mouth daily.   atorvastatin  (LIPITOR ) 80 MG tablet Take 1 tablet (80 mg total) by mouth daily.   citalopram  (CELEXA ) 40 MG tablet Take 1 tablet (40 mg total) by mouth daily.   clonazePAM  (KLONOPIN ) 0.5 MG tablet Take 1 tablet (0.5 mg total) by mouth 2 (two) times daily as needed for anxiety.   Continuous Blood Gluc Receiver (DEXCOM G7 RECEIVER) DEVI USE 1 receiver continuously   Continuous Glucose Sensor (DEXCOM G7 SENSOR) MISC Change sensor every 10 days   cyclobenzaprine  (FLEXERIL ) 10 MG tablet Take 1 tablet (10  mg total) by mouth 3 (three) times daily as needed for muscle spasms.   docusate sodium  (COLACE) 100 MG capsule Take 1 capsule (100 mg total) by mouth 2 (two) times daily.   ferrous sulfate  325 (65 FE) MG tablet Take 1 tablet (325 mg total) by mouth daily with breakfast.   furosemide  (LASIX ) 20 MG tablet Take 1 tablet (20 mg total) by mouth daily.   gabapentin  (NEURONTIN ) 100 MG  capsule Take 1 capsule (100 mg total) by mouth daily AND 2 capsules (200 mg total) at bedtime.   metoprolol  succinate (TOPROL -XL) 25 MG 24 hr tablet TAKE 1 TABLET BY MOUTH TWICE DAILY WITH OR IMMEDIATELY FOLLOWING A MEAL   oxyCODONE  (OXY IR/ROXICODONE ) 5 MG immediate release tablet Take 1 tablet (5 mg total) by mouth every 4 (four) hours as needed for moderate pain (pain score 4-6).   polyethylene glycol (MIRALAX  / GLYCOLAX ) 17 g packet Take 17 g by mouth daily.   potassium chloride  SA (KLOR-CON  M) 20 MEQ tablet Take 1 tablet (20 mEq total) by mouth daily.   pramipexole  (MIRAPEX ) 1 MG tablet Take 1 tablet (1 mg total) by mouth at bedtime.   traZODone  (DESYREL ) 150 MG tablet Take 1 tablet (150 mg total) by mouth at bedtime.   [DISCONTINUED] insulin  glargine-yfgn (SEMGLEE ) 100 UNIT/ML injection Inject 0.45 mLs (45 Units total) into the skin at bedtime.   [DISCONTINUED] metFORMIN  (GLUCOPHAGE -XR) 500 MG 24 hr tablet Take 2 tablets (1,000 mg total) by mouth daily with breakfast.   [DISCONTINUED] tirzepatide  (MOUNJARO ) 7.5 MG/0.5ML Pen Inject 7.5 mg into the skin once a week.   insulin  glargine-yfgn (SEMGLEE ) 100 UNIT/ML injection Inject 0.6 mLs (60 Units total) into the skin at bedtime.   metFORMIN  (GLUCOPHAGE -XR) 500 MG 24 hr tablet Take 2 tablets (1,000 mg total) by mouth daily with breakfast.   tirzepatide  (MOUNJARO ) 7.5 MG/0.5ML Pen Inject 7.5 mg into the skin once a week.   No facility-administered encounter medications on file as of 07/22/2024.    ALLERGIES: Allergies  Allergen Reactions   Amlodipine Besylate  Shortness Of Breath   Morphine And Codeine  Shortness Of Breath and Other (See Comments)    Chest pain   Amitriptyline Other (See Comments)    Unknown reaction   Butorphanol Tartrate Other (See Comments)    REACTION: ED visit and got for migraine - not sure of response   Compazine [Prochlorperazine Edisylate] Other (See Comments)    Altered mental status   Rocephin [Ceftriaxone Sodium In Dextrose ]     Given at GYN for infection - nauseated, dizziness (w/in last 7-8 years)   Sumatriptan Other (See Comments)    REACTION: HTN    VACCINATION STATUS: Immunization History  Administered Date(s) Administered   H1N1 08/20/2008   Influenza Whole 08/20/2008   PNEUMOCOCCAL CONJUGATE-20 01/31/2024   Pneumococcal Polysaccharide-23 08/20/2008   Td 05/30/1996    Diabetes She presents for her follow-up diabetic visit. She has type 2 diabetes mellitus. Onset time: Diagnosed at approx age of 18. Her disease course has been improving. There are no hypoglycemic associated symptoms. Pertinent negatives for diabetes include no weight loss. There are no hypoglycemic complications. Symptoms are stable. There are no diabetic complications. Risk factors for coronary artery disease include diabetes mellitus, dyslipidemia, family history, obesity, hypertension, sedentary lifestyle and post-menopausal. Current diabetic treatment includes oral agent (monotherapy) and insulin  injections (and Mounjaro ). She is compliant with treatment most of the time. Her weight is fluctuating minimally. She is following a generally healthy diet. When asked about meal planning, she reported none. She has not had a previous visit with a dietitian. She rarely participates in exercise. Her home blood glucose trend is decreasing steadily. Her breakfast blood glucose range is generally 130-140 mg/dl. Her lunch blood glucose range is generally 180-200 mg/dl. Her dinner blood glucose range is generally 180-200 mg/dl. Her bedtime blood glucose  range is generally 180-200 mg/dl. Her overall blood glucose range is 180-200 mg/dl. (She presents today  with her CGM showing above target glycemic profile overall, however slowly improving.  Her POCT A1c today is 7.6%, improving from last visit of 9.5%.  Analysis of her CGM shows TIR 52%, TAR 48%, TBR 0% with a GMI of 7.6%.  She has been particularly stressed recently, daughter recently had MI and is living with her temporarily.) An ACE inhibitor/angiotensin II receptor blocker is being taken. She does not see a podiatrist.Eye exam is current.    Review of systems  Constitutional: + stable body weight,  current Body mass index is 31.02 kg/m. , no fatigue, no subjective hyperthermia, no subjective hypothermia Eyes: no blurry vision, no xerophthalmia ENT: no sore throat, no nodules palpated in throat, no dysphagia/odynophagia, no hoarseness Cardiovascular: no chest pain, no shortness of breath, no palpitations, no leg swelling Respiratory: no cough, no shortness of breath Gastrointestinal: no nausea/vomiting/diarrhea, + intermittent constipation Musculoskeletal: + back pain and leg weakness- improving- ambulating with walker Skin: no rashes, no hyperemia Neurological: no tremors, no numbness, no tingling, no dizziness Psychiatric: no depression, no anxiety  Objective:     BP 138/82 (BP Location: Left Arm, Patient Position: Sitting, Cuff Size: Large)   Pulse 90   Ht 5' 6 (1.676 m)   Wt 192 lb 3.2 oz (87.2 kg)   BMI 31.02 kg/m   Wt Readings from Last 3 Encounters:  07/22/24 192 lb 3.2 oz (87.2 kg)  04/30/24 195 lb 12.8 oz (88.8 kg)  04/17/24 195 lb 9.6 oz (88.7 kg)     BP Readings from Last 3 Encounters:  07/22/24 138/82  04/30/24 130/76  04/17/24 118/72      Physical Exam- Limited  Constitutional:  Body mass index is 31.02 kg/m. , not in acute distress, normal state of mind Eyes:  EOMI, no exophthalmos Musculoskeletal: walking with walker due to recent back surgery and leg  weakness-however this is improving Skin:  no rashes, no hyperemia Neurological: no tremor with outstretched hands   Diabetic Foot Exam - Simple   No data filed     CMP ( most recent) CMP     Component Value Date/Time   NA 138 04/30/2024 0912   K 4.4 04/30/2024 0912   CL 98 04/30/2024 0912   CO2 27 04/30/2024 0912   GLUCOSE 200 (H) 04/30/2024 0912   BUN 15 04/30/2024 0912   CREATININE 0.81 04/30/2024 0912   CALCIUM  9.1 04/30/2024 0912   PROT 7.4 04/30/2024 0912   ALBUMIN  3.7 04/30/2024 0912   AST 25 04/30/2024 0912   ALT 28 04/30/2024 0912   ALKPHOS 128 (H) 04/30/2024 0912   BILITOT 0.8 04/30/2024 0912   GFRNONAA >60 04/30/2024 0912   GFRAA >60 06/27/2018 0929     Diabetic Labs (most recent): Lab Results  Component Value Date   HGBA1C 7.6 (A) 07/22/2024   HGBA1C 9.5 (A) 04/17/2024   HGBA1C 9.7 (A) 10/03/2023   MICROALBUR 30 mg/L 08/29/2022     Lipid Panel ( most recent) Lipid Panel     Component Value Date/Time   CHOL 165 06/29/2008 0842   TRIG 116 12/03/2023 1006   HDL 50 12/03/2023 1006   LDLCALC 82 06/29/2008 0842      No results found for: TSH, FREET4         Assessment & Plan:   1) Type 2 diabetes mellitus with hyperglycemia, with long-term current use of insulin  (HCC)  She presents today with her CGM showing above target glycemic profile overall, however slowly improving.  Her POCT A1c today is  7.6%, improving from last visit of 9.5%.  Analysis of her CGM shows TIR 52%, TAR 48%, TBR 0% with a GMI of 7.6%.  She has been particularly stressed recently, daughter recently had MI and is living with her temporarily.  - Stacy Frost has currently uncontrolled symptomatic type 2 DM since 63 years of age.   -Recent labs reviewed.  - I had a long discussion with her about the progressive nature of diabetes and the pathology behind its complications. -her diabetes is not currently complicated but she remains at a high risk for more acute and chronic  complications which include CAD, CVA, CKD, retinopathy, and neuropathy. These are all discussed in detail with her.  The following Lifestyle Medicine recommendations according to American College of Lifestyle Medicine [facility]) were discussed and offered to patient and she agrees to start the journey:  A. Whole Foods, Plant-based plate comprising of fruits and vegetables, plant-based proteins, whole-grain carbohydrates was discussed in detail with the patient.   A list for source of those nutrients were also provided to the patient.  Patient will use only water  or unsweetened tea for hydration. B.  The need to stay away from risky substances including alcohol, smoking; obtaining 7 to 9 hours of restorative sleep, at least 150 minutes of moderate intensity exercise weekly, the importance of healthy social connections,  and stress reduction techniques were discussed. C.  A full color page of  Calorie density of various food groups per pound showing examples of each food groups was provided to the patient.  - Nutritional counseling repeated at each appointment due to patients tendency to fall back in to old habits.  - The patient admits there is a room for improvement in their diet and drink choices. -  Suggestion is made for the patient to avoid simple carbohydrates from their diet including Cakes, Sweet Desserts / Pastries, Ice Cream, Soda (diet and regular), Sweet Tea, Candies, Chips, Cookies, Sweet Pastries, Store Bought Juices, Alcohol in Excess of 1-2 drinks a day, Artificial Sweeteners, Coffee Creamer, and Sugar-free Products. This will help patient to have stable blood glucose profile and potentially avoid unintended weight gain.   - I encouraged the patient to switch to unprocessed or minimally processed complex starch and increased protein intake (animal or plant source), fruits, and vegetables.   - Patient is advised to stick to a routine mealtimes to eat 3 meals a day and avoid unnecessary  snacks (to snack only to correct hypoglycemia).  - I have approached her with the following individualized plan to manage her diabetes and patient agrees:   -She is advised to continue Mounjaro  7.5 mg SQ weekly to and continue her Metformin  1000 mg ER PO daily with breakfast, and Semglee  60 units SQ nightly.  She notes her insurance sent her a letter stating that she would need to switch to Trulicity in the future, but he also notes her insurance company will change October 1st.  -she is encouraged to continue monitoring glucose 4 times daily (using her CGM), before meals and before bed, and to call the clinic if she has readings less than 70 or above 200 for 3 tests in a row.   - Adjustment parameters are given to her for hypo and hyperglycemia in writing.  - her Doreen was previously discontinued, due to polyuria and increased risk of UTI and yeast infections.   - Specific targets for  A1c; LDL, HDL, and Triglycerides were discussed with the patient.  2) Blood Pressure /Hypertension:  her blood pressure is controlled to target.   she is advised to continue her current medications including Metoprolol  200 mg p.o. daily with breakfast, Lisinopril 20 mg po daily, Lasix  40 mg po daily.  3) Lipids/Hyperlipidemia:    Recent lipid panel from 12/03/23 shows controlled LDL of 51.  she is advised to continue Simvastatin  40 mg daily at bedtime.  Side effects and precautions discussed with her.    4)  Weight/Diet:  her Body mass index is 31.02 kg/m.  -  clearly complicating her diabetes care.   she is a candidate for weight loss. I discussed with her the fact that loss of 5 - 10% of her  current body weight will have the most impact on her diabetes management.  Exercise, and detailed carbohydrates information provided  -  detailed on discharge instructions.  5) Chronic Care/Health Maintenance: -she is on ACEI/ARB and Statin medications and is encouraged to initiate and continue to follow up with  Ophthalmology, Dentist, Podiatrist at least yearly or according to recommendations, and advised to stay away from smoking. I have recommended yearly flu vaccine and pneumonia vaccine at least every 5 years; moderate intensity exercise for up to 150 minutes weekly; and sleep for at least 7 hours a day.  - she is advised to maintain close follow up with Maree Isles, MD for primary care needs, as well as her other providers for optimal and coordinated care.     I spent  31  minutes in the care of the patient today including review of labs from CMP, Lipids, Thyroid Function, Hematology (current and previous including abstractions from other facilities); face-to-face time discussing  her blood glucose readings/logs, discussing hypoglycemia and hyperglycemia episodes and symptoms, medications doses, her options of short and long term treatment based on the latest standards of care / guidelines;  discussion about incorporating lifestyle medicine;  and documenting the encounter. Risk reduction counseling performed per USPSTF guidelines to reduce obesity and cardiovascular risk factors.     Please refer to Patient Instructions for Blood Glucose Monitoring and Insulin /Medications Dosing Guide  in media tab for additional information. Please  also refer to  Patient Self Inventory in the Media  tab for reviewed elements of pertinent patient history.  [name] participated in the discussions, expressed understanding, and voiced agreement with the above plans.  All questions were answered to her satisfaction. she is encouraged to contact clinic should she have any questions or concerns prior to her return visit.     Follow up plan: - Return in about 4 months (around 11/21/2024) for Diabetes F/U with A1c in office, No previsit labs, Bring meter and logs.  Benton Rio, [facility] [address] [place], [place] [address] Phone: [phone] Fax:  [phone]  07/22/2024, 9:09 AM

## 2024-07-25 ENCOUNTER — Telehealth: Payer: Self-pay | Admitting: Internal Medicine

## 2024-07-25 MED ORDER — FUROSEMIDE 20 MG PO TABS: 20.0000 mg | ORAL_TABLET | Freq: Every day | ORAL | 3 refills | Status: AC

## 2024-07-25 NOTE — Telephone Encounter (Signed)
 Rx sent to pharmacy

## 2024-07-25 NOTE — Telephone Encounter (Signed)
°*  STAT* If patient is at the pharmacy, call can be transferred to refill team.   1. Which medications need to be refilled? (please list name of each medication and dose if known)  furosemide (LASIX) 20 MG tablet  2. Which pharmacy/location (including street and city if local pharmacy) is medication to be sent to? [place], [place] - [address]  3. Do they need a 30 day or 90 day supply? 90 day supply

## 2024-08-04 ENCOUNTER — Telehealth: Payer: Self-pay | Admitting: Internal Medicine

## 2024-08-04 DIAGNOSIS — I5032 Chronic diastolic (congestive) heart failure: Secondary | ICD-10-CM

## 2024-08-04 DIAGNOSIS — R600 Localized edema: Secondary | ICD-10-CM

## 2024-08-04 NOTE — Telephone Encounter (Signed)
 Spoke with patient - c/o swelling in legs, ankles & feet.  Can not tell if she has had any weight gain - states she does not keep up with it.  She c/o sob - mostly with exertion x 2 weeks.  Doe notice slight chest pain with exertion on occasion but never bad enough for Nitroglycerin .  Has been taking her Lasix  20mg  daily.

## 2024-08-04 NOTE — Telephone Encounter (Signed)
 Pt c/o swelling/edema: STAT if pt has developed SOB within 24 hours  If swelling, where is the swelling located? Legs, ankles, feet   How much weight have you gained and in what time span? No   Have you gained 2 pounds in a day or 5 pounds in a week? No   Do you have a log of your daily weights (if so, list)? No   Are you currently taking a fluid pill? Yes, Lasix    Are you currently SOB? Yes  Have you traveled recently in a car or plane for an extended period of time? No   Pt c/o Shortness Of Breath: STAT if SOB developed within the last 24 hours or pt is noticeably SOB on the phone  1. Are you currently SOB (can you hear that pt is SOB on the phone)? Yes  2. How long have you been experiencing SOB? About 2-3 weeks   3. Are you SOB when sitting or when up moving around? Both   4. Are you currently experiencing any other symptoms? Swelling, small chest pains

## 2024-08-06 NOTE — Telephone Encounter (Signed)
 Please set up for CBC, CMET and BNP

## 2024-08-11 NOTE — Telephone Encounter (Signed)
 Spoke with patient and discussed Dr. Okey' recommendation to have labs drawn.  CBC, CMET and BNP ordered and released to [facility].  Patient verbalized understanding of the above and expressed appreciation for call.

## 2024-08-12 ENCOUNTER — Ambulatory Visit: Payer: Self-pay | Admitting: Internal Medicine

## 2024-08-12 ENCOUNTER — Other Ambulatory Visit: Payer: Self-pay | Admitting: Internal Medicine

## 2024-08-12 DIAGNOSIS — Z952 Presence of prosthetic heart valve: Secondary | ICD-10-CM

## 2024-08-12 LAB — COMPREHENSIVE METABOLIC PANEL WITH GFR
ALT: 16 IU/L (ref 0–32)
AST: 21 IU/L (ref 0–40)
Albumin: 4 g/dL (ref 3.9–4.9)
Alkaline Phosphatase: 129 IU/L (ref 49–135)
BUN/Creatinine Ratio: 13 (ref 12–28)
BUN: 12 mg/dL (ref 8–27)
Bilirubin Total: 0.5 mg/dL (ref 0.0–1.2)
CO2: 26 mmol/L (ref 20–29)
Calcium: 9.5 mg/dL (ref 8.7–10.3)
Chloride: 99 mmol/L (ref 96–106)
Creatinine, Ser: 0.95 mg/dL (ref 0.57–1.00)
Globulin, Total: 2.9 g/dL (ref 1.5–4.5)
Glucose: 258 mg/dL — ABNORMAL HIGH (ref 70–99)
Potassium: 4.8 mmol/L (ref 3.5–5.2)
Sodium: 141 mmol/L (ref 134–144)
Total Protein: 6.9 g/dL (ref 6.0–8.5)
eGFR: 67 mL/min/1.73 (ref >59)

## 2024-08-12 LAB — CBC
Hematocrit: 36.5 % (ref 34.0–46.6)
Hemoglobin: 11.7 g/dL (ref 11.1–15.9)
MCH: 27.2 pg (ref 26.6–33.0)
MCHC: 32.1 g/dL (ref 31.5–35.7)
MCV: 85 fL (ref 79–97)
Platelets: 225 x10E3/uL (ref 150–450)
RBC: 4.3 x10E6/uL (ref 3.77–5.28)
RDW: 14.3 % (ref 11.7–15.4)
WBC: 6.1 x10E3/uL (ref 3.4–10.8)

## 2024-08-12 LAB — PRO B NATRIURETIC PEPTIDE: NT-Pro BNP: 293 pg/mL — ABNORMAL HIGH (ref 0–287)

## 2024-08-13 ENCOUNTER — Encounter (INDEPENDENT_AMBULATORY_CARE_PROVIDER_SITE_OTHER): Payer: Self-pay | Admitting: Gastroenterology

## 2024-08-20 ENCOUNTER — Other Ambulatory Visit: Payer: Self-pay

## 2024-08-21 ENCOUNTER — Other Ambulatory Visit: Payer: Self-pay | Admitting: Nurse Practitioner

## 2024-08-26 ENCOUNTER — Encounter: Payer: Self-pay | Admitting: Internal Medicine

## 2024-08-26 ENCOUNTER — Other Ambulatory Visit: Payer: Self-pay | Admitting: Internal Medicine

## 2024-08-26 ENCOUNTER — Ambulatory Visit: Attending: Internal Medicine

## 2024-08-26 DIAGNOSIS — Z952 Presence of prosthetic heart valve: Secondary | ICD-10-CM

## 2024-08-26 DIAGNOSIS — I350 Nonrheumatic aortic (valve) stenosis: Secondary | ICD-10-CM

## 2024-08-27 ENCOUNTER — Ambulatory Visit: Attending: Internal Medicine

## 2024-08-27 ENCOUNTER — Ambulatory Visit: Payer: Self-pay | Admitting: Internal Medicine

## 2024-08-27 DIAGNOSIS — I350 Nonrheumatic aortic (valve) stenosis: Secondary | ICD-10-CM | POA: Diagnosis not present

## 2024-08-27 DIAGNOSIS — Z952 Presence of prosthetic heart valve: Secondary | ICD-10-CM

## 2024-08-27 LAB — ECHOCARDIOGRAM COMPLETE
AR max vel: 1.27 cm2
AV Area VTI: 1.46 cm2
AV Area mean vel: 1.26 cm2
AV Mean grad: 6 mmHg
AV Peak grad: 10.4 mmHg
Ao pk vel: 1.61 m/s
Area-P 1/2: 4.19 cm2
Calc EF: 52.1 %
MV VTI: 2 cm2
S' Lateral: 2.2 cm
Single Plane A2C EF: 52.9 %
Single Plane A4C EF: 51.5 %

## 2024-09-03 ENCOUNTER — Other Ambulatory Visit: Payer: Self-pay

## 2024-09-03 DIAGNOSIS — R0602 Shortness of breath: Secondary | ICD-10-CM

## 2024-09-03 DIAGNOSIS — T148XXA Other injury of unspecified body region, initial encounter: Secondary | ICD-10-CM

## 2024-09-03 DIAGNOSIS — R600 Localized edema: Secondary | ICD-10-CM

## 2024-09-03 DIAGNOSIS — Z79899 Other long term (current) drug therapy: Secondary | ICD-10-CM

## 2024-09-11 ENCOUNTER — Other Ambulatory Visit (HOSPITAL_COMMUNITY): Payer: Self-pay

## 2024-09-11 ENCOUNTER — Telehealth: Payer: Self-pay | Admitting: Pharmacy Technician

## 2024-09-11 NOTE — Telephone Encounter (Signed)
 Pharmacy Patient Advocate Encounter   Received notification from CoverMyMeds that prior authorization for Dexcom G7 Sensor  is required/requested.   Insurance verification completed.   The patient is insured through [employer].   Per test claim: PA required; PA submitted to above mentioned insurance via Latent Key/confirmation #/EOC [id number] Status is pending

## 2024-09-16 ENCOUNTER — Other Ambulatory Visit (HOSPITAL_COMMUNITY): Payer: Self-pay

## 2024-09-16 ENCOUNTER — Ambulatory Visit: Payer: Self-pay | Admitting: Internal Medicine

## 2024-09-16 DIAGNOSIS — R600 Localized edema: Secondary | ICD-10-CM

## 2024-09-16 DIAGNOSIS — R7989 Other specified abnormal findings of blood chemistry: Secondary | ICD-10-CM

## 2024-09-16 DIAGNOSIS — Z79899 Other long term (current) drug therapy: Secondary | ICD-10-CM

## 2024-09-16 LAB — BASIC METABOLIC PANEL WITH GFR
BUN/Creatinine Ratio: 10 — ABNORMAL LOW (ref 12–28)
BUN: 10 mg/dL (ref 8–27)
CO2: 24 mmol/L (ref 20–29)
Calcium: 9.8 mg/dL (ref 8.7–10.3)
Chloride: 101 mmol/L (ref 96–106)
Creatinine, Ser: 0.99 mg/dL (ref 0.57–1.00)
Glucose: 232 mg/dL — ABNORMAL HIGH (ref 70–99)
Potassium: 4.5 mmol/L (ref 3.5–5.2)
Sodium: 140 mmol/L (ref 134–144)
eGFR: 64 mL/min/1.73 (ref >59)

## 2024-09-16 LAB — D-DIMER, QUANTITATIVE: D-DIMER: 1.37 mg{FEU}/L — ABNORMAL HIGH (ref 0.00–0.49)

## 2024-09-16 LAB — TSH: TSH: 1.47 u[IU]/mL (ref 0.450–4.500)

## 2024-09-16 LAB — BRAIN NATRIURETIC PEPTIDE: BNP: 86.1 pg/mL (ref 0.0–100.0)

## 2024-09-16 NOTE — Telephone Encounter (Signed)
 Pharmacy Patient Advocate Encounter  Received notification from [employer] that Prior Authorization for Dexcom G7 Sensor  has been APPROVED from 09/11/24 to 09/16/25. Ran test claim, Copay is $25.00. This test claim was processed through [facility]- copay amounts may vary at other pharmacies due to pharmacy/plan contracts, or as the patient moves through the different stages of their insurance plan.   PA #/Case ID/Reference #: [id number]

## 2024-09-18 ENCOUNTER — Encounter: Payer: Self-pay | Admitting: Internal Medicine

## 2024-09-19 ENCOUNTER — Emergency Department (HOSPITAL_BASED_OUTPATIENT_CLINIC_OR_DEPARTMENT_OTHER)

## 2024-09-19 ENCOUNTER — Other Ambulatory Visit: Payer: Self-pay

## 2024-09-19 ENCOUNTER — Other Ambulatory Visit: Payer: Self-pay | Admitting: Nurse Practitioner

## 2024-09-19 ENCOUNTER — Emergency Department (HOSPITAL_BASED_OUTPATIENT_CLINIC_OR_DEPARTMENT_OTHER)
Admission: EM | Admit: 2024-09-19 | Discharge: 2024-09-19 | Disposition: A | Discharge status: Home / Self Care | Source: Ambulatory Visit | Attending: Emergency Medicine | Admitting: Emergency Medicine

## 2024-09-19 ENCOUNTER — Encounter (HOSPITAL_BASED_OUTPATIENT_CLINIC_OR_DEPARTMENT_OTHER): Payer: Self-pay

## 2024-09-19 DIAGNOSIS — R000 Tachycardia, unspecified: Secondary | ICD-10-CM | POA: Insufficient documentation

## 2024-09-19 DIAGNOSIS — Z7984 Long term (current) use of oral hypoglycemic drugs: Secondary | ICD-10-CM | POA: Insufficient documentation

## 2024-09-19 DIAGNOSIS — R0609 Other forms of dyspnea: Secondary | ICD-10-CM | POA: Insufficient documentation

## 2024-09-19 DIAGNOSIS — Z7982 Long term (current) use of aspirin: Secondary | ICD-10-CM | POA: Diagnosis not present

## 2024-09-19 DIAGNOSIS — I10 Essential (primary) hypertension: Secondary | ICD-10-CM | POA: Insufficient documentation

## 2024-09-19 DIAGNOSIS — E119 Type 2 diabetes mellitus without complications: Secondary | ICD-10-CM | POA: Diagnosis not present

## 2024-09-19 DIAGNOSIS — R0602 Shortness of breath: Secondary | ICD-10-CM | POA: Diagnosis present

## 2024-09-19 LAB — BASIC METABOLIC PANEL WITH GFR
Anion gap: 13 (ref 5–15)
BUN: 11 mg/dL (ref 8–23)
CO2: 26 mmol/L (ref 22–32)
Calcium: 10 mg/dL (ref 8.9–10.3)
Chloride: 101 mmol/L (ref 98–111)
Creatinine, Ser: 0.82 mg/dL (ref 0.44–1.00)
GFR, Estimated: >60 mL/min (ref >60)
Glucose, Bld: 181 mg/dL — ABNORMAL HIGH (ref 70–99)
Potassium: 4 mmol/L (ref 3.5–5.1)
Sodium: 140 mmol/L (ref 135–145)

## 2024-09-19 LAB — CBC
HCT: 36 % (ref 36.0–46.0)
Hemoglobin: 11.9 g/dL — ABNORMAL LOW (ref 12.0–15.0)
MCH: 27.2 pg (ref 26.0–34.0)
MCHC: 33.1 g/dL (ref 30.0–36.0)
MCV: 82.2 fL (ref 80.0–100.0)
Platelets: 215 K/uL (ref 150–400)
RBC: 4.38 MIL/uL (ref 3.87–5.11)
RDW: 14.1 % (ref 11.5–15.5)
WBC: 7 K/uL (ref 4.0–10.5)
nRBC: 0 % (ref 0.0–0.2)

## 2024-09-19 LAB — TROPONIN T, HIGH SENSITIVITY
Troponin T High Sensitivity: 18 ng/L (ref 0–19)
Troponin T High Sensitivity: 17 ng/L (ref 0–19)

## 2024-09-19 LAB — PRO BRAIN NATRIURETIC PEPTIDE: Pro Brain Natriuretic Peptide: 146 pg/mL (ref <300.0)

## 2024-09-19 LAB — CBG MONITORING, ED: Glucose-Capillary: 178 mg/dL — ABNORMAL HIGH (ref 70–99)

## 2024-09-19 MED ORDER — IOHEXOL 350 MG/ML SOLN
75.0000 mL | Freq: Once | INTRAVENOUS | Status: AC | PRN
  Administered 2024-09-19: 75 mL via INTRAVENOUS

## 2024-09-19 NOTE — ED Triage Notes (Signed)
 Pt c/o SHOB, swelling in the feet, BUE numbness/ tingling in hands x several weeks. Dyspnea on exertion, some lightheaded/ dizzy feeling today.   Advises that ddimer came back elevated per cardiologist, bloodwork taken Wednesday. Told CT 12/30, here for more urgent eval

## 2024-09-19 NOTE — ED Provider Notes (Signed)
 [place] EMERGENCY DEPARTMENT AT [facility] Provider Note   CSN: [id number] Arrival date & time: 09/19/24  1718     Patient presents with: Shortness of Breath and Chest Pain   Stacy Frost is a 63 y.o. female with past medical history significant for HLD, HTN, s/p aortic valve replacement with bioprosthetic valve, PAD, diabetes who presents with concern for shob, tingling in hands, dypnea on exertion, some mild lightheadness / dizzy feeling intermittently for several weeks, worse today.  Had a D-dimer performed which was reportedly elevated around 1.39, was told to follow-up for a CT angio PE study but the study was scheduled for 12/30.  Came to the emergency department for more urgent evaluation.  Denies any chest pain or shortness of breath at what asked.  Stacy Frost feels like her left leg may be slightly more swollen than the right but that that seems to always be the case.    Shortness of Breath Associated symptoms: chest pain   Chest Pain Associated symptoms: shortness of breath        Prior to Admission medications   Medication Sig Start Date End Date Taking? Authorizing Provider  acetaminophen  (TYLENOL ) 325 MG tablet Take 2 tablets (650 mg total) by mouth every 4 (four) hours as needed for mild pain (pain score 1-3) (or temp > 100.5). 02/11/24   Angiulli, Toribio PARAS, PA-C  aspirin  EC 325 MG tablet Take 1 tablet (325 mg total) by mouth daily. 05/21/23   Roddenberry, Myron G, PA-C  atorvastatin  (LIPITOR ) 80 MG tablet Take 1 tablet (80 mg total) by mouth daily. 02/11/24   Angiulli, Toribio PARAS, PA-C  citalopram  (CELEXA ) 40 MG tablet Take 1 tablet (40 mg total) by mouth daily. 02/11/24   Angiulli, Toribio PARAS, PA-C  clonazePAM  (KLONOPIN ) 0.5 MG tablet Take 1 tablet (0.5 mg total) by mouth 2 (two) times daily as needed for anxiety. 02/11/24   Angiulli, Toribio PARAS, PA-C  Continuous Blood Gluc Receiver (DEXCOM G7 RECEIVER) DEVI USE 1 receiver continuously 07/05/22   [provider]   Continuous Glucose Sensor (DEXCOM G7 SENSOR) MISC Change sensor every 10 days 04/17/24   Therisa Benton PARAS, NP  cyclobenzaprine  (FLEXERIL ) 10 MG tablet Take 1 tablet (10 mg total) by mouth 3 (three) times daily as needed for muscle spasms. 02/11/24   Angiulli, Toribio PARAS, PA-C  docusate sodium  (COLACE) 100 MG capsule Take 1 capsule (100 mg total) by mouth 2 (two) times daily. 02/11/24   Angiulli, Toribio PARAS, PA-C  ferrous sulfate  325 (65 FE) MG tablet Take 1 tablet (325 mg total) by mouth daily with breakfast. 02/11/24   Angiulli, Toribio PARAS, PA-C  furosemide  (LASIX ) 20 MG tablet Take 1 tablet (20 mg total) by mouth daily. 07/25/24   Miriam Norris, NP  gabapentin  (NEURONTIN ) 100 MG capsule Take 1 capsule (100 mg total) by mouth daily AND 2 capsules (200 mg total) at bedtime. 02/11/24   Angiulli, Toribio PARAS, PA-C  insulin  glargine-yfgn (SEMGLEE ) 100 UNIT/ML injection Inject 0.6 mLs (60 Units total) into the skin at bedtime. 07/22/24   Therisa Benton PARAS, NP  metFORMIN  (GLUCOPHAGE -XR) 500 MG 24 hr tablet Take 2 tablets (1,000 mg total) by mouth daily with breakfast. 07/22/24   Therisa Benton PARAS, NP  metoprolol  succinate (TOPROL -XL) 25 MG 24 hr tablet TAKE 1 TABLET BY MOUTH TWICE DAILY WITH OR IMMEDIATELY FOLLOWING A MEAL 08/14/24   Okey Vina GAILS, MD  oxyCODONE  (OXY IR/ROXICODONE ) 5 MG immediate release tablet Take 1 tablet (5 mg total) by mouth  every 4 (four) hours as needed for moderate pain (pain score 4-6). 02/11/24   Angiulli, Toribio PARAS, PA-C  polyethylene glycol (MIRALAX  / GLYCOLAX ) 17 g packet Take 17 g by mouth daily. 02/11/24   Angiulli, Toribio PARAS, PA-C  potassium chloride  SA (KLOR-CON  M) 20 MEQ tablet Take 1 tablet (20 mEq total) by mouth daily. 05/30/24   Okey Vina GAILS, MD  pramipexole  (MIRAPEX ) 1 MG tablet Take 1 tablet (1 mg total) by mouth at bedtime. 02/12/24   Angiulli, Toribio PARAS, PA-C  tirzepatide  (MOUNJARO ) 7.5 MG/0.5ML Pen Inject 7.5 mg into the skin once a week. 07/22/24   Therisa Benton PARAS, NP   traZODone  (DESYREL ) 150 MG tablet Take 1 tablet (150 mg total) by mouth at bedtime. 02/11/24   Angiulli, Toribio PARAS, PA-C    Allergies: Amlodipine besylate, Morphine and codeine , Amitriptyline, Butorphanol tartrate, Compazine [prochlorperazine edisylate], Rocephin [ceftriaxone sodium in dextrose ], and Sumatriptan    Review of Systems  Respiratory:  Positive for shortness of breath.   Cardiovascular:  Positive for chest pain.  All other systems reviewed and are negative.   Updated Vital Signs BP (!) 158/80   Pulse 79   Temp 98.3 F (36.8 C)   Resp 13   SpO2 100%   Physical Exam Vitals and nursing note reviewed.  Constitutional:      General: Stacy Frost is not in acute distress.    Appearance: Normal appearance.  HENT:     Head: Normocephalic and atraumatic.  Eyes:     General:        Right eye: No discharge.        Left eye: No discharge.  Cardiovascular:     Rate and Rhythm: Regular rhythm. Tachycardia present.     Pulses: Normal pulses.     Heart sounds: No murmur heard.    No friction rub. No gallop.     Comments: DP, PT pulses 2+ in bilateral lower extremities Pulmonary:     Effort: Pulmonary effort is normal.     Breath sounds: Normal breath sounds.  Abdominal:     General: Bowel sounds are normal.     Palpations: Abdomen is soft.  Musculoskeletal:     Comments: Very mild trace to 1+ nonpitting edema bilaterally to lower extremities.  No redness, or warmth of bilateral calves.  Skin:    General: Skin is warm and dry.     Capillary Refill: Capillary refill takes less than 2 seconds.  Neurological:     Mental Status: Stacy Frost is alert and oriented to person, place, and time.  Psychiatric:        Mood and Affect: Mood normal.        Behavior: Behavior normal.     (all labs ordered are listed, but only abnormal results are displayed) Labs Reviewed  BASIC METABOLIC PANEL WITH GFR - Abnormal; Notable for the following components:      Result Value   Glucose, Bld 181 (*)     All other components within normal limits  CBC - Abnormal; Notable for the following components:   Hemoglobin 11.9 (*)    All other components within normal limits  CBG MONITORING, ED - Abnormal; Notable for the following components:   Glucose-Capillary 178 (*)    All other components within normal limits  PRO BRAIN NATRIURETIC PEPTIDE  TROPONIN T, HIGH SENSITIVITY  TROPONIN T, HIGH SENSITIVITY    EKG: None  Radiology: CT Angio Chest PE W and/or Wo Contrast Result Date: 09/19/2024 EXAM: CTA of the  Chest with contrast for PE 09/19/2024 06:15:18 PM TECHNIQUE: CTA of the chest was performed without and with the administration of 75 mL of iohexol  (OMNIPAQUE ) 350 MG/ML injection. Multiplanar reformatted images are provided for review. MIP images are provided for review. Automated exposure control, iterative reconstruction, and/or weight based adjustment of the mA/kV was utilized to reduce the radiation dose to as low as reasonably achievable. COMPARISON: 05/11/2023 CLINICAL HISTORY: Pulmonary embolism (PE) suspected, high prob. FINDINGS: PULMONARY ARTERIES: Pulmonary arteries are adequately opacified for evaluation. No pulmonary embolism. Main pulmonary artery is normal in caliber. MEDIASTINUM: The heart and pericardium demonstrate no acute abnormality. Prior aortic valve replacement. Scattered coronary artery and aortic calcifications. There is no acute abnormality of the thoracic aorta. LYMPH NODES: No mediastinal, hilar or axillary lymphadenopathy. LUNGS AND PLEURA: The lungs are without acute process. No focal consolidation or pulmonary edema. No pleural effusion or pneumothorax. UPPER ABDOMEN: Limited images of the upper abdomen are unremarkable. SOFT TISSUES AND BONES: No acute bone or soft tissue abnormality. IMPRESSION: 1. No evidence of pulmonary embolus. 2. No acute cardiopulmonary disease. Electronically signed by: Franky Crease MD 09/19/2024 06:23 PM EST RP Workstation: [id number]      Procedures   Medications Ordered in the ED  iohexol  (OMNIPAQUE ) 350 MG/ML injection 75 mL (75 mLs Intravenous Contrast Given 09/19/24 1810)                                    Medical Decision Making Amount and/or Complexity of Data Reviewed Labs: ordered. Radiology: ordered.  This patient is a 63 y.o. female  who presents to the ED for concern of chest pain, shob.   Differential diagnoses prior to evaluation: The emergent differential diagnosis includes, but is not limited to,  asthma exacerbation, COPD exacerbation, acute upper respiratory infection, acute bronchitis, chronic bronchitis, interstitial lung disease, ARDS, PE, pneumonia, atypical ACS, carbon monoxide poisoning, spontaneous pneumothorax, new CHF vs CHF exacerbation, versus other . This is not an exhaustive differential.   Past Medical History / Co-morbidities / Social History: HLD, HTN, s/p aortic valve replacement with bioprosthetic valve, PAD, diabetes   Additional history: Chart reviewed. Pertinent results include: Reviewed recent outpatient lab work, D-dimer was elevated at 1.37 from 4 days ago  Physical Exam: Physical exam performed. The pertinent findings include: Initially tachycardic, hypertensive, pulse 102, blood pressure 213/86.  BP improved on recheck, 158/79.    Lab Tests/Imaging studies: I personally interpreted labs/imaging and the pertinent results include:  Troponin borderline at 18.  BNP unremarkable. CT angio PE study shows no evidence of blood clot in the lungs or other abnormality.  BMP with elevated glucose at 181 consistent with diabetes but without evidence of acute hyperglycemia, DKA, HHS.  CBC with mild anemia, hemoglobin 11.9.  Delta troponin flat.  I agree with the radiologist interpretation.  Cardiac monitoring: EKG obtained and interpreted by myself and attending physician which shows: Sinus tachycardia, no acute ST ST changes  Disposition: After consideration of the diagnostic  results and the patients response to treatment, I feel that patient without any chest pain, shortness of breath on reassessment, given her unremarkable workup I feel that Stacy Frost is stable to follow-up closely with her cardiologist for possible stress test, echo to further elucidate her exertional shortness of breath, but no indication for hospital admission at this time.SABRA   emergency department workup does not suggest an emergent condition requiring admission or immediate intervention beyond what  has been performed at this time. The plan is: as above. The patient is safe for discharge and has been instructed to return immediately for worsening symptoms, change in symptoms or any other concerns.   Final diagnoses:  Dyspnea on exertion    ED Discharge Orders     None          Rosan Sherlean DEL, PA-C 09/19/24 2000    Pamella Ozell LABOR, DO 09/23/24 1154

## 2024-09-19 NOTE — ED Notes (Signed)
 DC paperwork given and verbally understood.

## 2024-09-19 NOTE — Telephone Encounter (Signed)
 S/w the patient. She has an appointment set for 09/23/24 to have CT to rule out PE. She reports that she is very worried about waiting 4 more days to get this scan; seeing as her D-dimer is elevated and her shortness of breath is so bad.  Informed her that she can go to the ER to get a scan to rule out PE. She reports that she thinks she will go to the ER to get CT instead of waiting.

## 2024-09-19 NOTE — Discharge Instructions (Addendum)
 As we discussed your workup today was reassuring, you do not have a blood clot in your lungs, you do not have any evidence of heart attack, or heart failure, do recommend that you follow-up closely with your cardiologist for further evaluation of your shortness of breath on exertion if the symptoms continue.  Please return if you have worsening shortness of breath, chest pain prior to being able to follow-up with your cardiologist.

## 2024-09-23 ENCOUNTER — Ambulatory Visit (HOSPITAL_COMMUNITY): Admission: RE | Admit: 2024-09-23 | Source: Ambulatory Visit

## 2024-09-23 NOTE — Telephone Encounter (Signed)
 Called pt   CT negative for PE She is still SOB and ankles swollen    Would recomm increasing lasix  to 40 mg daily for a few days   Follow response   Pt on potassium   WRite back in mychart how feeling

## 2024-10-03 ENCOUNTER — Ambulatory Visit (HOSPITAL_COMMUNITY)

## 2024-10-09 ENCOUNTER — Other Ambulatory Visit: Payer: Self-pay | Admitting: Internal Medicine

## 2024-10-22 ENCOUNTER — Other Ambulatory Visit: Payer: Self-pay

## 2024-10-22 ENCOUNTER — Emergency Department (HOSPITAL_COMMUNITY)
Admission: EM | Admit: 2024-10-22 | Discharge: 2024-10-22 | Disposition: A | Discharge status: Home / Self Care | Attending: Emergency Medicine | Admitting: Emergency Medicine

## 2024-10-22 ENCOUNTER — Encounter (HOSPITAL_COMMUNITY): Payer: Self-pay

## 2024-10-22 DIAGNOSIS — E1165 Type 2 diabetes mellitus with hyperglycemia: Secondary | ICD-10-CM | POA: Insufficient documentation

## 2024-10-22 DIAGNOSIS — R739 Hyperglycemia, unspecified: Secondary | ICD-10-CM

## 2024-10-22 DIAGNOSIS — Z7982 Long term (current) use of aspirin: Secondary | ICD-10-CM | POA: Insufficient documentation

## 2024-10-22 DIAGNOSIS — Z794 Long term (current) use of insulin: Secondary | ICD-10-CM | POA: Insufficient documentation

## 2024-10-22 DIAGNOSIS — Z7984 Long term (current) use of oral hypoglycemic drugs: Secondary | ICD-10-CM | POA: Insufficient documentation

## 2024-10-22 LAB — I-STAT CHEM 8, ED
BUN: 16 mg/dL (ref 8–23)
Calcium, Ion: 1.22 mmol/L (ref 1.15–1.40)
Chloride: 97 mmol/L — ABNORMAL LOW (ref 98–111)
Creatinine, Ser: 1 mg/dL (ref 0.44–1.00)
Glucose, Bld: 334 mg/dL — ABNORMAL HIGH (ref 70–99)
HCT: 40 % (ref 36.0–46.0)
Hemoglobin: 13.6 g/dL (ref 12.0–15.0)
Potassium: 4.3 mmol/L (ref 3.5–5.1)
Sodium: 137 mmol/L (ref 135–145)
TCO2: 24 mmol/L (ref 22–32)

## 2024-10-22 LAB — URINALYSIS, ROUTINE W REFLEX MICROSCOPIC
Bacteria, UA: NONE SEEN
Bilirubin Urine: NEGATIVE
Glucose, UA: >=500 mg/dL — AB
Ketones, ur: NEGATIVE mg/dL
Nitrite: NEGATIVE
Protein, ur: 30 mg/dL — AB
Specific Gravity, Urine: 1.01 (ref 1.005–1.030)
pH: 5 (ref 5.0–8.0)

## 2024-10-22 LAB — CBC
HCT: 37.3 % (ref 36.0–46.0)
Hemoglobin: 12.6 g/dL (ref 12.0–15.0)
MCH: 27.3 pg (ref 26.0–34.0)
MCHC: 33.8 g/dL (ref 30.0–36.0)
MCV: 80.9 fL (ref 80.0–100.0)
Platelets: 239 K/uL (ref 150–400)
RBC: 4.61 MIL/uL (ref 3.87–5.11)
RDW: 13.6 % (ref 11.5–15.5)
WBC: 8.9 K/uL (ref 4.0–10.5)
nRBC: 0 % (ref 0.0–0.2)

## 2024-10-22 LAB — CBG MONITORING, ED
Glucose-Capillary: 311 mg/dL — ABNORMAL HIGH (ref 70–99)
Glucose-Capillary: 327 mg/dL — ABNORMAL HIGH (ref 70–99)

## 2024-10-22 NOTE — ED Triage Notes (Addendum)
 Pt presents with elevated glucose > 500 since last night. She reports 535 last PM and 502 this AM and she has not had anything to eat or drink . Only symptoms she is experiencing is increased thirst, urination, and irritability. Pt takes Metformin , Semglee   and Mounjaro . Due to insurance she has been out of Mounjaro  for 2 weeks now.

## 2024-10-22 NOTE — ED Provider Notes (Signed)
 " [place] EMERGENCY DEPARTMENT AT [facility] Provider Note   CSN: [id number] Arrival date & time: 10/22/24  1157     Patient presents with: Hyperglycemia   Stacy Frost is a 63 y.o. female.   HPI Patient presents with concern of hyperglycemia. She notes her glucose has been running in the 500 range over the past few days, after transient decrease to 300 and return to 500 she now presents for evaluation.  She has mild thirst, increased urination, but otherwise no pain, fever, syncope, vomiting. Patient notes that she has been unable to obtain her Mounjaro  due to insurance/physician concerns.    Prior to Admission medications  Medication Sig Start Date End Date Taking? Authorizing Provider  acetaminophen  (TYLENOL ) 325 MG tablet Take 2 tablets (650 mg total) by mouth every 4 (four) hours as needed for mild pain (pain score 1-3) (or temp > 100.5). 02/11/24   Angiulli, Toribio PARAS, PA-C  aspirin  EC 325 MG tablet Take 1 tablet (325 mg total) by mouth daily. 05/21/23   Roddenberry, Myron G, PA-C  atorvastatin  (LIPITOR ) 80 MG tablet TAKE 1 TABLET BY MOUTH AT BEDTIME 10/10/24   Okey Vina GAILS, MD  citalopram  (CELEXA ) 40 MG tablet Take 1 tablet (40 mg total) by mouth daily. 02/11/24   Angiulli, Toribio PARAS, PA-C  clonazePAM  (KLONOPIN ) 0.5 MG tablet Take 1 tablet (0.5 mg total) by mouth 2 (two) times daily as needed for anxiety. 02/11/24   Angiulli, Toribio PARAS, PA-C  Continuous Blood Gluc Receiver (DEXCOM G7 RECEIVER) DEVI USE 1 receiver continuously 07/05/22   [provider]  Continuous Glucose Sensor (DEXCOM G7 SENSOR) MISC Change sensor every 10 days 04/17/24   Therisa Benton PARAS, NP  cyclobenzaprine  (FLEXERIL ) 10 MG tablet Take 1 tablet (10 mg total) by mouth 3 (three) times daily as needed for muscle spasms. 02/11/24   Angiulli, Toribio PARAS, PA-C  docusate sodium  (COLACE) 100 MG capsule Take 1 capsule (100 mg total) by mouth 2 (two) times daily. 02/11/24   Angiulli, Toribio PARAS, PA-C  ferrous  sulfate 325 (65 FE) MG tablet Take 1 tablet (325 mg total) by mouth daily with breakfast. 02/11/24   Angiulli, Toribio PARAS, PA-C  furosemide  (LASIX ) 20 MG tablet Take 1 tablet (20 mg total) by mouth daily. 07/25/24   Miriam Norris, NP  gabapentin  (NEURONTIN ) 100 MG capsule Take 1 capsule (100 mg total) by mouth daily AND 2 capsules (200 mg total) at bedtime. 02/11/24   Angiulli, Toribio PARAS, PA-C  insulin  glargine-yfgn (SEMGLEE ) 100 UNIT/ML injection Inject 0.6 mLs (60 Units total) into the skin at bedtime. 07/22/24   Therisa Benton PARAS, NP  metFORMIN  (GLUCOPHAGE -XR) 500 MG 24 hr tablet Take 2 tablets (1,000 mg total) by mouth daily with breakfast. 07/22/24   Therisa Benton PARAS, NP  metoprolol  succinate (TOPROL -XL) 25 MG 24 hr tablet TAKE 1 TABLET BY MOUTH TWICE DAILY WITH OR IMMEDIATELY FOLLOWING A MEAL 08/14/24   Okey Vina GAILS, MD  oxyCODONE  (OXY IR/ROXICODONE ) 5 MG immediate release tablet Take 1 tablet (5 mg total) by mouth every 4 (four) hours as needed for moderate pain (pain score 4-6). 02/11/24   Angiulli, Toribio PARAS, PA-C  polyethylene glycol (MIRALAX  / GLYCOLAX ) 17 g packet Take 17 g by mouth daily. 02/11/24   Angiulli, Toribio PARAS, PA-C  potassium chloride  SA (KLOR-CON  M) 20 MEQ tablet Take 1 tablet (20 mEq total) by mouth daily. 05/30/24   Okey Vina GAILS, MD  pramipexole  (MIRAPEX ) 1 MG tablet Take 1 tablet (1  mg total) by mouth at bedtime. 02/12/24   Angiulli, Toribio PARAS, PA-C  tirzepatide  (MOUNJARO ) 7.5 MG/0.5ML Pen Inject 7.5 mg into the skin once a week. 07/22/24   Therisa Benton PARAS, NP  traZODone  (DESYREL ) 150 MG tablet Take 1 tablet (150 mg total) by mouth at bedtime. 02/11/24   Angiulli, Toribio PARAS, PA-C    Allergies: Amlodipine besylate, Morphine and codeine , Amitriptyline, Butorphanol tartrate, Compazine [prochlorperazine edisylate], Rocephin [ceftriaxone sodium in dextrose ], and Sumatriptan    Review of Systems  Updated Vital Signs BP (!) 151/82 (BP Location: Right Arm)   Pulse 95   Temp 98.9 F  (37.2 C) (Oral)   Resp 20   Ht 1.676 m (5' 6)   Wt 87.1 kg   SpO2 97%   BMI 30.99 kg/m   Physical Exam Vitals and nursing note reviewed.  Constitutional:      General: She is not in acute distress.    Appearance: She is well-developed.  HENT:     Head: Normocephalic and atraumatic.  Eyes:     Conjunctiva/sclera: Conjunctivae normal.  Cardiovascular:     Rate and Rhythm: Normal rate and regular rhythm.  Pulmonary:     Effort: Pulmonary effort is normal. No respiratory distress.     Breath sounds: Normal breath sounds. No stridor.  Abdominal:     General: There is no distension.  Skin:    General: Skin is warm and dry.  Neurological:     Mental Status: She is alert and oriented to person, place, and time.     Cranial Nerves: No cranial nerve deficit.  Psychiatric:        Mood and Affect: Mood normal.     (all labs ordered are listed, but only abnormal results are displayed) Labs Reviewed  URINALYSIS, ROUTINE W REFLEX MICROSCOPIC - Abnormal; Notable for the following components:      Result Value   Color, Urine STRAW (*)    Glucose, UA >=500 (*)    Hgb urine dipstick SMALL (*)    Protein, ur 30 (*)    Leukocytes,Ua TRACE (*)    All other components within normal limits  CBG MONITORING, ED - Abnormal; Notable for the following components:   Glucose-Capillary 311 (*)    All other components within normal limits  CBG MONITORING, ED - Abnormal; Notable for the following components:   Glucose-Capillary 327 (*)    All other components within normal limits  I-STAT CHEM 8, ED - Abnormal; Notable for the following components:   Chloride 97 (*)    Glucose, Bld 334 (*)    All other components within normal limits  CBC    EKG: None  Radiology: No results found.   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 Initial vitals reassuring, cardiac 95 sinus normal pulse ox 97% room air normal  Amount and/or Complexity of Data Reviewed Independent Historian: spouse    Details: Arrives after initial evaluation Labs: ordered. Decision-making details documented in ED Course.  Risk Decision regarding hospitalization. Diagnosis or treatment significantly limited by social determinants of health.   2:34 PM Glucose 300, no evidence  for DKA, nonketotic hyperosmolar state.  She, her husband and I had a lengthy conversation about hyperglycemia, options available for additional control, but with no evidence for complications, patient will obtain her medications and/or increase medications as we discussed follow-up with primary care.      Final diagnoses:  Hyperglycemia    ED Discharge Orders     None          Garrick Charleston, MD 10/22/24 1435  "

## 2024-10-22 NOTE — Discharge Instructions (Addendum)
 As discussed, it is important to try to obtain the remainder of your medications for glucose control today.  If you are unable to obtain your Mounjaro  please use 1.5 tablets of metformin , 750 mg total.  Use this until you are able to follow-up with your physician or get your Mounjaro .

## 2024-10-28 ENCOUNTER — Other Ambulatory Visit (HOSPITAL_COMMUNITY)

## 2024-11-17 ENCOUNTER — Other Ambulatory Visit: Payer: Self-pay

## 2024-11-21 ENCOUNTER — Other Ambulatory Visit: Payer: Self-pay | Admitting: *Deleted

## 2024-11-21 ENCOUNTER — Encounter: Payer: Self-pay | Admitting: Nurse Practitioner

## 2024-11-21 ENCOUNTER — Ambulatory Visit: Admitting: Nurse Practitioner

## 2024-11-21 VITALS — BP 134/80 | HR 68 | Ht 66.0 in | Wt 199.8 lb

## 2024-11-21 DIAGNOSIS — Z7985 Long-term (current) use of injectable non-insulin antidiabetic drugs: Secondary | ICD-10-CM

## 2024-11-21 DIAGNOSIS — Z7984 Long term (current) use of oral hypoglycemic drugs: Secondary | ICD-10-CM

## 2024-11-21 DIAGNOSIS — E1165 Type 2 diabetes mellitus with hyperglycemia: Secondary | ICD-10-CM

## 2024-11-21 DIAGNOSIS — I10 Essential (primary) hypertension: Secondary | ICD-10-CM | POA: Diagnosis not present

## 2024-11-21 DIAGNOSIS — Z794 Long term (current) use of insulin: Secondary | ICD-10-CM | POA: Diagnosis not present

## 2024-11-21 MED ORDER — TIRZEPATIDE 10 MG/0.5ML ~~LOC~~ SOAJ: 10.0000 mg | SUBCUTANEOUS | 1 refills | Status: AC

## 2024-11-21 MED ORDER — LANTUS SOLOSTAR 100 UNIT/ML ~~LOC~~ SOPN: 70.0000 [IU] | PEN_INJECTOR | Freq: Every day | SUBCUTANEOUS | 3 refills | Status: AC

## 2024-11-21 MED ORDER — TIRZEPATIDE 10 MG/0.5ML ~~LOC~~ SOAJ: 10.0000 mg | SUBCUTANEOUS | 1 refills | Status: DC

## 2024-11-21 NOTE — Progress Notes (Signed)
 "                                                                        Endocrinology Follow Up Note       11/21/2024, 9:13 AM   Subjective:    Patient ID: Stacy Frost, female    DOB: 09/07/1961.  Stacy Frost is being seen in follow up after being seen in consultation for management of currently uncontrolled symptomatic diabetes requested by  Maree Isles, MD.   Past Medical History:  Diagnosis Date   Anxiety    Aortic stenosis    Mild to moderate   Bicuspid aortic valve    Essential hypertension, benign    GERD (gastroesophageal reflux disease)    Heart murmur    S/P Aortic Valve Replacement July 2024   History of cardiac catheterization    Normal coronaries 2008   Hyperlipidemia    Migraine headache    MVP (mitral valve prolapse)    Obesity    Pain in joint, ankle and foot    Chronic   Palpitations    Documented PACs by previous monitoring   Restless leg syndrome    Skin tag    Type 2 diabetes mellitus (HCC)    Unilateral primary osteoarthritis, right knee 09/06/2016    Past Surgical History:  Procedure Laterality Date   ACHILLES TENDON REPAIR  2003   Left   AORTIC VALVE REPLACEMENT N/A 05/14/2023   Procedure: AORTIC VALVE REPLACEMENT (AVR);  Surgeon: Shyrl Linnie KIDD, MD;  Location: [facility] OR;  Service: Open Heart Surgery;  Laterality: N/A;   BACK SURGERY  01/28/2024   CARDIAC CATHETERIZATION  2008   Normal coronary arteries; normal LV systolic function; mild dilation of aortic root; mild dilation of proximal ascending aorta; likely bicuspid aortic valve   CHOLECYSTECTOMY  2001   Poor EF at 17%   COLONOSCOPY N/A 07/17/2014   Procedure: COLONOSCOPY;  Surgeon: Claudis RAYMOND Rivet, MD;  Location: [facility] SUITE;  Service: Endoscopy;  Laterality: N/A;  105   ESOPHAGEAL DILATION N/A 06/11/2015   Procedure: ESOPHAGEAL DILATION;  Surgeon: Claudis RAYMOND Rivet, MD;  Location: [facility] ORS;  Service: Endoscopy;  Laterality: N/AMERL Stai 54/56/58   ESOPHAGOGASTRODUODENOSCOPY N/A  07/17/2014   Procedure: ESOPHAGOGASTRODUODENOSCOPY (EGD);  Surgeon: Claudis RAYMOND Rivet, MD;  Location: [facility];  Service: Endoscopy;  Laterality: N/A;   ESOPHAGOGASTRODUODENOSCOPY (EGD) WITH PROPOFOL  N/A 06/11/2015   Procedure: ESOPHAGOGASTRODUODENOSCOPY (EGD) WITH PROPOFOL ;  Surgeon: Claudis RAYMOND Rivet, MD;  Location: [facility] ORS;  Service: Endoscopy;  Laterality: N/A;  730   KNEE ARTHROSCOPY WITH MEDIAL MENISECTOMY Right 07/02/2018   Procedure: RIGHT KNEE ARTHROSCOPY, DEBRIDEMENT, MEDIAL MENISECTOMY;  Surgeon: Anderson Maude ORN, MD;  Location: [facility] OR;  Service: Orthopedics;  Laterality: Right;   KNEE SURGERY     MALONEY DILATION N/A 07/17/2014   Procedure: MALONEY DILATION;  Surgeon: Claudis RAYMOND Rivet, MD;  Location: [facility];  Service: Endoscopy;  Laterality: N/A;   RIGHT/LEFT HEART CATH AND CORONARY ANGIOGRAPHY N/A 05/02/2023   Procedure: RIGHT/LEFT HEART CATH AND CORONARY ANGIOGRAPHY;  Surgeon: Jordan, Peter M, MD;  Location: [facility] INVASIVE CV LAB;  Service: Cardiovascular;  Laterality: N/A;   TEE WITHOUT CARDIOVERSION N/A 05/14/2023   Procedure: TRANSESOPHAGEAL ECHOCARDIOGRAM;  Surgeon: Shyrl Linnie KIDD, MD;  Location: [facility] OR;  Service: Open Heart Surgery;  Laterality: N/A;   UMBILICAL HERNIA REPAIR  2003    Social History   Socioeconomic History   Marital status: Married    Spouse name: Darina   Number of children: 2   Years of education: Not on file   Highest education level: Some college, no degree  Occupational History   Occupation: Arts development officer  Tobacco Use   Smoking status: Never   Smokeless tobacco: Never  Vaping Use   Vaping status: Never Used  Substance and Sexual Activity   Alcohol use: No    Alcohol/week: 0.0 standard drinks of alcohol   Drug use: No   Sexual activity: Yes  Other Topics Concern   Not on file  Social History Narrative   Married, lives with husband in a one level home. They recently got a puppy. She avoids caffeine, does not exercise.    Social  Drivers of Health   Tobacco Use: Low Risk (11/21/2024)   Patient History    Smoking Tobacco Use: Never    Smokeless Tobacco Use: Never    Passive Exposure: Not on file  Financial Resource Strain: Not on file  Food Insecurity: No Food Insecurity (02/01/2024)   Hunger Vital Sign    Worried About Running Out of Food in the Last Year: Never true    Ran Out of Food in the Last Year: Never true  Transportation Needs: No Transportation Needs (02/01/2024)   PRAPARE - Administrator, Civil Service (Medical): No    Lack of Transportation (Non-Medical): No  Physical Activity: Not on file  Stress: Not on file  Social Connections: Not on file  Depression (PHQ2-9): Low Risk (09/14/2023)   Depression (PHQ2-9)    PHQ-2 Score: 2  Alcohol Screen: Not on file  Housing: Low Risk (02/01/2024)   Housing Stability Vital Sign    Unable to Pay for Housing in the Last Year: No    Number of Times Moved in the Last Year: 0    Homeless in the Last Year: No  Utilities: Not At Risk (02/01/2024)   AHC Utilities    Threatened with loss of utilities: No  Health Literacy: Low Risk (02/04/2023)   Received from [facility]   Health Literacy    : Never    Family History  Problem Relation Age of Onset   Depression Mother    Cancer Mother        Lung mets (unsure as to what kind)   Diabetes Mother    Hypertension Father    Depression Father    Heart attack Father    Depression Daughter    Anxiety disorder Daughter    Stroke Daughter    Transient ischemic attack Brother    Emphysema Maternal Grandfather    Brain cancer Paternal Grandmother    Heart attack Paternal Grandfather     Outpatient Encounter Medications as of 11/21/2024  Medication Sig   acetaminophen  (TYLENOL ) 325 MG tablet Take 2 tablets (650 mg total) by mouth every 4 (four) hours as needed for mild pain (pain score 1-3) (or temp > 100.5).   aspirin  EC 325 MG tablet Take 1 tablet (325 mg total) by mouth daily.   atorvastatin   (LIPITOR ) 80 MG tablet TAKE 1 TABLET BY MOUTH AT BEDTIME   citalopram  (CELEXA ) 40 MG tablet Take 1 tablet (40 mg total) by mouth daily.   clonazePAM  (KLONOPIN ) 0.5 MG tablet Take 1 tablet (  0.5 mg total) by mouth 2 (two) times daily as needed for anxiety.   Continuous Blood Gluc Receiver (DEXCOM G7 RECEIVER) DEVI USE 1 receiver continuously   Continuous Glucose Sensor (DEXCOM G7 SENSOR) MISC Change sensor every 10 days   cyclobenzaprine  (FLEXERIL ) 10 MG tablet Take 1 tablet (10 mg total) by mouth 3 (three) times daily as needed for muscle spasms.   docusate sodium  (COLACE) 100 MG capsule Take 1 capsule (100 mg total) by mouth 2 (two) times daily.   ferrous sulfate  325 (65 FE) MG tablet Take 1 tablet (325 mg total) by mouth daily with breakfast.   furosemide  (LASIX ) 20 MG tablet Take 1 tablet (20 mg total) by mouth daily.   gabapentin  (NEURONTIN ) 100 MG capsule Take 1 capsule (100 mg total) by mouth daily AND 2 capsules (200 mg total) at bedtime.   insulin  glargine (LANTUS  SOLOSTAR) 100 UNIT/ML Solostar Pen Inject 70 Units into the skin at bedtime.   metFORMIN  (GLUCOPHAGE -XR) 500 MG 24 hr tablet Take 2 tablets (1,000 mg total) by mouth daily with breakfast.   metoprolol  succinate (TOPROL -XL) 25 MG 24 hr tablet TAKE 1 TABLET BY MOUTH TWICE DAILY WITH OR IMMEDIATELY FOLLOWING A MEAL   oxyCODONE  (OXY IR/ROXICODONE ) 5 MG immediate release tablet Take 1 tablet (5 mg total) by mouth every 4 (four) hours as needed for moderate pain (pain score 4-6).   polyethylene glycol (MIRALAX  / GLYCOLAX ) 17 g packet Take 17 g by mouth daily.   potassium chloride  SA (KLOR-CON  M) 20 MEQ tablet Take 1 tablet (20 mEq total) by mouth daily.   pramipexole  (MIRAPEX ) 1 MG tablet Take 1 tablet (1 mg total) by mouth at bedtime.   tirzepatide  (MOUNJARO ) 10 MG/0.5ML Pen Inject 10 mg into the skin once a week.   traZODone  (DESYREL ) 150 MG tablet Take 1 tablet (150 mg total) by mouth at bedtime.   [DISCONTINUED] insulin   glargine-yfgn (SEMGLEE ) 100 UNIT/ML injection Inject 0.6 mLs (60 Units total) into the skin at bedtime.   [DISCONTINUED] tirzepatide  (MOUNJARO ) 7.5 MG/0.5ML Pen Inject 7.5 mg into the skin once a week.   No facility-administered encounter medications on file as of 11/21/2024.    ALLERGIES: Allergies  Allergen Reactions   Amlodipine Besylate Shortness Of Breath   Morphine And Codeine  Shortness Of Breath and Other (See Comments)    Chest pain   Amitriptyline Other (See Comments)    Unknown reaction   Butorphanol Tartrate Other (See Comments)    REACTION: ED visit and got for migraine - not sure of response   Compazine [Prochlorperazine Edisylate] Other (See Comments)    Altered mental status   Rocephin [Ceftriaxone Sodium In Dextrose ]     Given at GYN for infection - nauseated, dizziness (w/in last 7-8 years)   Sumatriptan Other (See Comments)    REACTION: HTN    VACCINATION STATUS: Immunization History  Administered Date(s) Administered   H1N1 08/20/2008   Influenza Whole 08/20/2008   PNEUMOCOCCAL CONJUGATE-20 01/31/2024   Pneumococcal Polysaccharide-23 08/20/2008   Td 05/30/1996    Diabetes She presents for her follow-up diabetic visit. She has type 2 diabetes mellitus. Onset time: Diagnosed at approx age of 71. Her disease course has been worsening. There are no hypoglycemic associated symptoms. Pertinent negatives for diabetes include no weight loss. There are no hypoglycemic complications. Symptoms are stable. There are no diabetic complications. Risk factors for coronary artery disease include diabetes mellitus, dyslipidemia, family history, obesity, hypertension, sedentary lifestyle and post-menopausal. Current diabetic treatment includes oral agent (monotherapy)  and insulin  injections (and Mounjaro ). She is compliant with treatment most of the time. Her weight is fluctuating minimally. She is following a generally healthy diet. When asked about meal planning, she reported  none. She has not had a previous visit with a dietitian. She rarely participates in exercise. Her home blood glucose trend is increasing steadily. Her breakfast blood glucose range is generally >200 mg/dl. Her lunch blood glucose range is generally >200 mg/dl. Her dinner blood glucose range is generally >200 mg/dl. Her bedtime blood glucose range is generally >200 mg/dl. Her overall blood glucose range is >200 mg/dl. (She presents today with her CGM showing above target glycemic profile overall.  Her POCT A1c today is 9.3%, increasing from last visit of 7.6%.  Analysis of her CGM shows TIR 14%, TAR 86%, TBR 0% with a GMI of 8.9%.  She has been in the ED for hyperglycemia and has been sick between visits as well.) An ACE inhibitor/angiotensin II receptor blocker is being taken. She does not see a podiatrist.Eye exam is current.    Review of systems  Constitutional: + increasing body weight,  current Body mass index is 32.25 kg/m. , no fatigue, no subjective hyperthermia, no subjective hypothermia, has migraine today Eyes: no blurry vision, no xerophthalmia ENT: no sore throat, no nodules palpated in throat, no dysphagia/odynophagia, no hoarseness Cardiovascular: no chest pain, no shortness of breath, no palpitations, no leg swelling Respiratory: no cough, no shortness of breath Gastrointestinal: no nausea/vomiting/diarrhea Musculoskeletal: + back pain and leg weakness- improving- ambulating with walker Skin: no rashes, + hyperemia to left foot Neurological: no tremors, no numbness, no tingling, no dizziness Psychiatric: no depression, no anxiety  Objective:     BP 134/80 (BP Location: Left Arm, Patient Position: Sitting, Cuff Size: Large)   Pulse 68   Ht 5' 6 (1.676 m)   Wt 199 lb 12.8 oz (90.6 kg)   BMI 32.25 kg/m   Wt Readings from Last 3 Encounters:  11/21/24 199 lb 12.8 oz (90.6 kg)  10/22/24 192 lb (87.1 kg)  07/22/24 192 lb 3.2 oz (87.2 kg)     BP Readings from Last 3  Encounters:  11/21/24 134/80  10/22/24 (!) 151/82  09/19/24 (!) 158/80      Physical Exam- Limited  Constitutional:  Body mass index is 32.25 kg/m. , not in acute distress, normal state of mind Eyes:  EOMI, no exophthalmos Musculoskeletal: no gross deformities, strength intact in all four extremities, no gross restriction of joint movements Skin:  no rashes, + hyperemia to left foot- pulses intact, cap refill slightly sluggish- follows with cardiology for this Neurological: no tremor with outstretched hands   Diabetic Foot Exam - Simple   No data filed     CMP ( most recent) CMP     Component Value Date/Time   NA 137 10/22/2024 1248   NA 140 09/15/2024 1020   K 4.3 10/22/2024 1248   CL 97 (L) 10/22/2024 1248   CO2 26 09/19/2024 1731   GLUCOSE 334 (H) 10/22/2024 1248   BUN 16 10/22/2024 1248   BUN 10 09/15/2024 1020   CREATININE 1.00 10/22/2024 1248   CALCIUM  10.0 09/19/2024 1731   PROT 6.9 08/11/2024 1159   ALBUMIN  4.0 08/11/2024 1159   AST 21 08/11/2024 1159   ALT 16 08/11/2024 1159   ALKPHOS 129 08/11/2024 1159   BILITOT 0.5 08/11/2024 1159   GFRNONAA >60 09/19/2024 1731   GFRAA >60 06/27/2018 0929     Diabetic Labs (most recent):  Lab Results  Component Value Date   HGBA1C 7.6 (A) 07/22/2024   HGBA1C 9.5 (A) 04/17/2024   HGBA1C 9.7 (A) 10/03/2023   MICROALBUR 30 mg/L 08/29/2022     Lipid Panel ( most recent) Lipid Panel     Component Value Date/Time   CHOL 165 06/29/2008 0842   TRIG 116 12/03/2023 1006   HDL 50 12/03/2023 1006   LDLCALC 82 06/29/2008 0842      Lab Results  Component Value Date   TSH 1.470 09/15/2024           Assessment & Plan:   1) Type 2 diabetes mellitus with hyperglycemia, with long-term current use of insulin  (HCC)  She presents today with her CGM showing above target glycemic profile overall.  Her POCT A1c today is 9.3%, increasing from last visit of 7.6%.  Analysis of her CGM shows TIR 14%, TAR 86%, TBR 0% with  a GMI of 8.9%.  She has been in the ED for hyperglycemia and has been sick between visits as well.  - Stacy Frost has currently uncontrolled symptomatic type 2 DM since 64 years of age.   -Recent labs reviewed.  - I had a long discussion with her about the progressive nature of diabetes and the pathology behind its complications. -her diabetes is not currently complicated but she remains at a high risk for more acute and chronic complications which include CAD, CVA, CKD, retinopathy, and neuropathy. These are all discussed in detail with her.  The following Lifestyle Medicine recommendations according to American College of Lifestyle Medicine [facility]) were discussed and offered to patient and she agrees to start the journey:  A. Whole Foods, Plant-based plate comprising of fruits and vegetables, plant-based proteins, whole-grain carbohydrates was discussed in detail with the patient.   A list for source of those nutrients were also provided to the patient.  Patient will use only water  or unsweetened tea for hydration. B.  The need to stay away from risky substances including alcohol, smoking; obtaining 7 to 9 hours of restorative sleep, at least 150 minutes of moderate intensity exercise weekly, the importance of healthy social connections,  and stress reduction techniques were discussed. C.  A full color page of  Calorie density of various food groups per pound showing examples of each food groups was provided to the patient.  - Nutritional counseling repeated/built upon at each appointment.  - The patient admits there is a room for improvement in their diet and drink choices. -  Suggestion is made for the patient to avoid simple carbohydrates from their diet including Cakes, Sweet Desserts / Pastries, Ice Cream, Soda (diet and regular), Sweet Tea, Candies, Chips, Cookies, Sweet Pastries, Store Bought Juices, Alcohol in Excess of 1-2 drinks a day, Artificial Sweeteners, Coffee Creamer, and  Sugar-free Products. This will help patient to have stable blood glucose profile and potentially avoid unintended weight gain.   - I encouraged the patient to switch to unprocessed or minimally processed complex starch and increased protein intake (animal or plant source), fruits, and vegetables.   - Patient is advised to stick to a routine mealtimes to eat 3 meals a day and avoid unnecessary snacks (to snack only to correct hypoglycemia).  - I have approached her with the following individualized plan to manage her diabetes and patient agrees:   -Will increase Mounjaro  to 10 mg SQ weekly to and continue her Metformin  1000 mg ER PO daily with breakfast, and increase Lantus  to 70 units SQ nightly (changed from  Semglee  due to product availability).    -she is encouraged to continue monitoring glucose 4 times daily (using her CGM), before meals and before bed, and to call the clinic if she has readings less than 70 or above 200 for 3 tests in a row.   - Adjustment parameters are given to her for hypo and hyperglycemia in writing.  - her Doreen was previously discontinued, due to polyuria and increased risk of UTI and yeast infections.   - Specific targets for  A1c; LDL, HDL, and Triglycerides were discussed with the patient.  2) Blood Pressure /Hypertension:  her blood pressure is controlled to target.   she is advised to continue her current medications as prescribed by PCP.  3) Lipids/Hyperlipidemia:    Recent lipid panel from 12/03/23 shows controlled LDL of 51.  she is advised to continue Simvastatin  40 mg daily at bedtime.  Side effects and precautions discussed with her.    4)  Weight/Diet:  her Body mass index is 32.25 kg/m.  -  clearly complicating her diabetes care.   she is a candidate for weight loss. I discussed with her the fact that loss of 5 - 10% of her  current body weight will have the most impact on her diabetes management.  Exercise, and detailed carbohydrates information  provided  -  detailed on discharge instructions.  5) Chronic Care/Health Maintenance: -she is on ACEI/ARB and Statin medications and is encouraged to initiate and continue to follow up with Ophthalmology, Dentist, Podiatrist at least yearly or according to recommendations, and advised to stay away from smoking. I have recommended yearly flu vaccine and pneumonia vaccine at least every 5 years; moderate intensity exercise for up to 150 minutes weekly; and sleep for at least 7 hours a day.  - she is advised to maintain close follow up with Maree Isles, MD for primary care needs, as well as her other providers for optimal and coordinated care.      I spent  33  minutes in the care of the patient today including review of labs from CMP, Lipids, Thyroid Function, Hematology (current and previous including abstractions from other facilities); face-to-face time discussing  her blood glucose readings/logs, discussing hypoglycemia and hyperglycemia episodes and symptoms, medications doses, her options of short and long term treatment based on the latest standards of care / guidelines;  discussion about incorporating lifestyle medicine;  and documenting the encounter. Risk reduction counseling performed per USPSTF guidelines to reduce obesity and cardiovascular risk factors.     Please refer to Patient Instructions for Blood Glucose Monitoring and Insulin /Medications Dosing Guide  in media tab for additional information. Please  also refer to  Patient Self Inventory in the Media  tab for reviewed elements of pertinent patient history.  Stacy Frost participated in the discussions, expressed understanding, and voiced agreement with the above plans.  All questions were answered to her satisfaction. she is encouraged to contact clinic should she have any questions or concerns prior to her return visit.     Follow up plan: - Return in about 3 months (around 02/19/2025) for Diabetes F/U with A1c in office, No  previsit labs, Bring meter and logs.  Benton Rio, [facility] [address] [place], [place] [address] Phone: [phone] Fax: [phone]  11/21/2024, 9:13 AM    "

## 2025-01-09 ENCOUNTER — Ambulatory Visit: Admitting: Internal Medicine

## 2025-02-19 ENCOUNTER — Ambulatory Visit: Admitting: Nurse Practitioner
